# Patient Record
Sex: Female | Born: 1935 | Race: White | Hispanic: No | Marital: Married | Smoking: Former smoker
Health system: Southern US, Community
[De-identification: ages and names within clinical notes are randomized; demographics above are authoritative.]

## PROBLEM LIST (undated history)

## (undated) DIAGNOSIS — I340 Nonrheumatic mitral (valve) insufficiency: Secondary | ICD-10-CM

## (undated) DIAGNOSIS — K5730 Diverticulosis of large intestine without perforation or abscess without bleeding: Secondary | ICD-10-CM

## (undated) DIAGNOSIS — J4489 Other specified chronic obstructive pulmonary disease: Secondary | ICD-10-CM

## (undated) DIAGNOSIS — M810 Age-related osteoporosis without current pathological fracture: Secondary | ICD-10-CM

## (undated) DIAGNOSIS — E785 Hyperlipidemia, unspecified: Secondary | ICD-10-CM

## (undated) DIAGNOSIS — L900 Lichen sclerosus et atrophicus: Secondary | ICD-10-CM

## (undated) DIAGNOSIS — K559 Vascular disorder of intestine, unspecified: Secondary | ICD-10-CM

## (undated) DIAGNOSIS — K2990 Gastroduodenitis, unspecified, without bleeding: Secondary | ICD-10-CM

## (undated) DIAGNOSIS — M069 Rheumatoid arthritis, unspecified: Secondary | ICD-10-CM

## (undated) DIAGNOSIS — K2970 Gastritis, unspecified, without bleeding: Secondary | ICD-10-CM

## (undated) DIAGNOSIS — H7091 Unspecified mastoiditis, right ear: Secondary | ICD-10-CM

## (undated) DIAGNOSIS — I10 Essential (primary) hypertension: Secondary | ICD-10-CM

## (undated) DIAGNOSIS — N6019 Diffuse cystic mastopathy of unspecified breast: Secondary | ICD-10-CM

## (undated) DIAGNOSIS — J449 Chronic obstructive pulmonary disease, unspecified: Secondary | ICD-10-CM

## (undated) DIAGNOSIS — I071 Rheumatic tricuspid insufficiency: Secondary | ICD-10-CM

## (undated) DIAGNOSIS — S82409A Unspecified fracture of shaft of unspecified fibula, initial encounter for closed fracture: Secondary | ICD-10-CM

## (undated) DIAGNOSIS — C50912 Malignant neoplasm of unspecified site of left female breast: Secondary | ICD-10-CM

## (undated) DIAGNOSIS — M5030 Other cervical disc degeneration, unspecified cervical region: Secondary | ICD-10-CM

## (undated) DIAGNOSIS — F439 Reaction to severe stress, unspecified: Secondary | ICD-10-CM

## (undated) DIAGNOSIS — I7300 Raynaud's syndrome without gangrene: Secondary | ICD-10-CM

## (undated) DIAGNOSIS — I350 Nonrheumatic aortic (valve) stenosis: Secondary | ICD-10-CM

## (undated) DIAGNOSIS — E039 Hypothyroidism, unspecified: Secondary | ICD-10-CM

## (undated) DIAGNOSIS — M4800 Spinal stenosis, site unspecified: Secondary | ICD-10-CM

## (undated) DIAGNOSIS — K644 Residual hemorrhoidal skin tags: Secondary | ICD-10-CM

## (undated) DIAGNOSIS — K219 Gastro-esophageal reflux disease without esophagitis: Secondary | ICD-10-CM

## (undated) DIAGNOSIS — F419 Anxiety disorder, unspecified: Secondary | ICD-10-CM

## (undated) DIAGNOSIS — Z87891 Personal history of nicotine dependence: Secondary | ICD-10-CM

## (undated) HISTORY — DX: Diverticulosis of large intestine without perforation or abscess without bleeding: K57.30

## (undated) HISTORY — DX: Gastritis, unspecified, without bleeding: K29.70

## (undated) HISTORY — PX: TONSILLECTOMY: SUR1361

## (undated) HISTORY — DX: Raynaud's syndrome without gangrene: I73.00

## (undated) HISTORY — PX: BREAST LUMPECTOMY: SHX2

## (undated) HISTORY — DX: Vascular disorder of intestine, unspecified: K55.9

## (undated) HISTORY — PX: OTHER SURGICAL HISTORY: SHX169

## (undated) HISTORY — DX: Unspecified fracture of shaft of unspecified fibula, initial encounter for closed fracture: S82.409A

## (undated) HISTORY — DX: Spinal stenosis, site unspecified: M48.00

## (undated) HISTORY — DX: Gastro-esophageal reflux disease without esophagitis: K21.9

## (undated) HISTORY — DX: Gastroduodenitis, unspecified, without bleeding: K29.90

## (undated) HISTORY — DX: Personal history of nicotine dependence: Z87.891

## (undated) HISTORY — DX: Other specified chronic obstructive pulmonary disease: J44.89

## (undated) HISTORY — DX: Chronic obstructive pulmonary disease, unspecified: J44.9

## (undated) HISTORY — PX: BREAST BIOPSY: SHX20

## (undated) HISTORY — DX: Unspecified mastoiditis, right ear: H70.91

## (undated) HISTORY — DX: Age-related osteoporosis without current pathological fracture: M81.0

## (undated) HISTORY — PX: CATARACT EXTRACTION W/ INTRAOCULAR LENS IMPLANT: SHX1309

## (undated) HISTORY — DX: Residual hemorrhoidal skin tags: K64.4

## (undated) HISTORY — DX: Other cervical disc degeneration, unspecified cervical region: M50.30

## (undated) HISTORY — DX: Hyperlipidemia, unspecified: E78.5

## (undated) HISTORY — DX: Lichen sclerosus et atrophicus: L90.0

## (undated) HISTORY — PX: COLONOSCOPY: SHX174

## (undated) HISTORY — DX: Diffuse cystic mastopathy of unspecified breast: N60.19

## (undated) HISTORY — DX: Hypothyroidism, unspecified: E03.9

---

## 1966-02-07 HISTORY — PX: ABDOMINAL HYSTERECTOMY: SHX81

## 1997-07-16 ENCOUNTER — Other Ambulatory Visit: Admission: RE | Admit: 1997-07-16 | Discharge: 1997-07-16 | Payer: Self-pay | Admitting: *Deleted

## 1997-09-04 ENCOUNTER — Other Ambulatory Visit: Admission: RE | Admit: 1997-09-04 | Discharge: 1997-09-04 | Payer: Self-pay | Admitting: *Deleted

## 1998-03-31 ENCOUNTER — Encounter: Admission: RE | Admit: 1998-03-31 | Discharge: 1998-06-29 | Payer: Self-pay | Admitting: Otolaryngology

## 1999-02-08 HISTORY — PX: OTHER SURGICAL HISTORY: SHX169

## 1999-02-16 ENCOUNTER — Ambulatory Visit (HOSPITAL_COMMUNITY): Admission: RE | Admit: 1999-02-16 | Discharge: 1999-02-16 | Payer: Self-pay | Admitting: Family Medicine

## 1999-10-14 ENCOUNTER — Encounter: Admission: RE | Admit: 1999-10-14 | Discharge: 1999-10-14 | Payer: Self-pay | Admitting: *Deleted

## 1999-10-14 ENCOUNTER — Encounter: Payer: Self-pay | Admitting: *Deleted

## 1999-10-15 ENCOUNTER — Ambulatory Visit (HOSPITAL_COMMUNITY): Admission: RE | Admit: 1999-10-15 | Discharge: 1999-10-15 | Payer: Self-pay | Admitting: *Deleted

## 1999-11-05 ENCOUNTER — Encounter: Payer: Self-pay | Admitting: *Deleted

## 1999-11-05 ENCOUNTER — Encounter: Admission: RE | Admit: 1999-11-05 | Discharge: 1999-11-05 | Payer: Self-pay | Admitting: *Deleted

## 2000-01-04 ENCOUNTER — Encounter: Admission: RE | Admit: 2000-01-04 | Discharge: 2000-01-04 | Payer: Self-pay | Admitting: Orthopedic Surgery

## 2000-01-04 ENCOUNTER — Encounter: Payer: Self-pay | Admitting: Orthopedic Surgery

## 2000-01-10 ENCOUNTER — Encounter: Payer: Self-pay | Admitting: Orthopedic Surgery

## 2000-01-10 ENCOUNTER — Encounter: Admission: RE | Admit: 2000-01-10 | Discharge: 2000-01-10 | Payer: Self-pay | Admitting: Orthopedic Surgery

## 2000-06-06 ENCOUNTER — Encounter (INDEPENDENT_AMBULATORY_CARE_PROVIDER_SITE_OTHER): Payer: Self-pay | Admitting: Specialist

## 2000-06-06 ENCOUNTER — Inpatient Hospital Stay (HOSPITAL_COMMUNITY): Admission: EM | Admit: 2000-06-06 | Discharge: 2000-06-09 | Payer: Self-pay | Admitting: *Deleted

## 2000-07-08 DIAGNOSIS — K559 Vascular disorder of intestine, unspecified: Secondary | ICD-10-CM

## 2000-07-08 HISTORY — DX: Vascular disorder of intestine, unspecified: K55.9

## 2000-10-16 ENCOUNTER — Other Ambulatory Visit: Admission: RE | Admit: 2000-10-16 | Discharge: 2000-10-16 | Payer: Self-pay | Admitting: *Deleted

## 2001-01-02 ENCOUNTER — Ambulatory Visit (HOSPITAL_COMMUNITY): Admission: RE | Admit: 2001-01-02 | Discharge: 2001-01-02 | Payer: Self-pay | Admitting: Pulmonary Disease

## 2001-01-02 ENCOUNTER — Encounter: Payer: Self-pay | Admitting: Pulmonary Disease

## 2002-02-07 HISTORY — PX: OTHER SURGICAL HISTORY: SHX169

## 2002-08-01 LAB — HM COLONOSCOPY

## 2002-09-23 ENCOUNTER — Encounter: Payer: Self-pay | Admitting: Pulmonary Disease

## 2002-09-23 ENCOUNTER — Encounter: Admission: RE | Admit: 2002-09-23 | Discharge: 2002-09-23 | Payer: Self-pay | Admitting: Pulmonary Disease

## 2002-10-09 HISTORY — PX: NASAL SINUS SURGERY: SHX719

## 2002-11-04 ENCOUNTER — Encounter: Payer: Self-pay | Admitting: Otolaryngology

## 2002-11-04 ENCOUNTER — Encounter: Admission: RE | Admit: 2002-11-04 | Discharge: 2002-11-04 | Payer: Self-pay | Admitting: Otolaryngology

## 2002-11-05 ENCOUNTER — Ambulatory Visit (HOSPITAL_COMMUNITY): Admission: RE | Admit: 2002-11-05 | Discharge: 2002-11-05 | Payer: Self-pay | Admitting: Otolaryngology

## 2002-11-05 ENCOUNTER — Ambulatory Visit (HOSPITAL_BASED_OUTPATIENT_CLINIC_OR_DEPARTMENT_OTHER): Admission: RE | Admit: 2002-11-05 | Discharge: 2002-11-05 | Payer: Self-pay | Admitting: Otolaryngology

## 2002-11-05 ENCOUNTER — Encounter (INDEPENDENT_AMBULATORY_CARE_PROVIDER_SITE_OTHER): Payer: Self-pay | Admitting: *Deleted

## 2002-11-08 ENCOUNTER — Encounter: Payer: Self-pay | Admitting: Family Medicine

## 2002-11-08 LAB — CONVERTED CEMR LAB

## 2003-12-22 ENCOUNTER — Ambulatory Visit: Payer: Self-pay | Admitting: Family Medicine

## 2004-02-08 DIAGNOSIS — S82409A Unspecified fracture of shaft of unspecified fibula, initial encounter for closed fracture: Secondary | ICD-10-CM

## 2004-02-08 HISTORY — DX: Unspecified fracture of shaft of unspecified fibula, initial encounter for closed fracture: S82.409A

## 2004-02-13 ENCOUNTER — Encounter: Admission: RE | Admit: 2004-02-13 | Discharge: 2004-02-13 | Payer: Self-pay | Admitting: *Deleted

## 2004-08-17 ENCOUNTER — Ambulatory Visit: Payer: Self-pay | Admitting: Family Medicine

## 2004-08-31 ENCOUNTER — Ambulatory Visit: Payer: Self-pay | Admitting: Family Medicine

## 2004-09-01 ENCOUNTER — Ambulatory Visit: Payer: Self-pay | Admitting: Internal Medicine

## 2004-09-10 ENCOUNTER — Ambulatory Visit: Payer: Self-pay | Admitting: Cardiology

## 2004-09-14 ENCOUNTER — Ambulatory Visit: Payer: Self-pay | Admitting: Family Medicine

## 2004-09-28 ENCOUNTER — Ambulatory Visit: Payer: Self-pay | Admitting: Family Medicine

## 2004-10-12 ENCOUNTER — Ambulatory Visit: Payer: Self-pay | Admitting: Family Medicine

## 2004-11-07 HISTORY — PX: US ECHOCARDIOGRAPHY: HXRAD669

## 2004-11-16 ENCOUNTER — Ambulatory Visit: Payer: Self-pay | Admitting: Cardiology

## 2004-11-24 ENCOUNTER — Ambulatory Visit: Payer: Self-pay | Admitting: Family Medicine

## 2004-11-29 ENCOUNTER — Ambulatory Visit: Payer: Self-pay

## 2004-12-01 ENCOUNTER — Ambulatory Visit: Payer: Self-pay | Admitting: Family Medicine

## 2005-01-14 ENCOUNTER — Encounter: Admission: RE | Admit: 2005-01-14 | Discharge: 2005-01-14 | Payer: Self-pay | Admitting: Orthopedic Surgery

## 2005-02-03 ENCOUNTER — Encounter: Admission: RE | Admit: 2005-02-03 | Discharge: 2005-02-03 | Payer: Self-pay | Admitting: Orthopedic Surgery

## 2005-02-16 ENCOUNTER — Encounter: Admission: RE | Admit: 2005-02-16 | Discharge: 2005-02-16 | Payer: Self-pay | Admitting: Orthopedic Surgery

## 2005-03-03 ENCOUNTER — Ambulatory Visit: Payer: Self-pay | Admitting: Family Medicine

## 2005-04-07 DIAGNOSIS — H7091 Unspecified mastoiditis, right ear: Secondary | ICD-10-CM

## 2005-04-07 HISTORY — DX: Unspecified mastoiditis, right ear: H70.91

## 2005-04-14 ENCOUNTER — Ambulatory Visit: Payer: Self-pay | Admitting: Family Medicine

## 2005-05-24 ENCOUNTER — Encounter: Admission: RE | Admit: 2005-05-24 | Discharge: 2005-05-24 | Payer: Self-pay | Admitting: Obstetrics & Gynecology

## 2005-06-06 ENCOUNTER — Encounter: Admission: RE | Admit: 2005-06-06 | Discharge: 2005-06-06 | Payer: Self-pay | Admitting: Orthopedic Surgery

## 2005-06-06 ENCOUNTER — Ambulatory Visit: Payer: Self-pay | Admitting: Cardiology

## 2005-06-07 HISTORY — PX: SHOULDER ARTHROSCOPY W/ ROTATOR CUFF REPAIR: SHX2400

## 2005-06-21 ENCOUNTER — Encounter: Admission: RE | Admit: 2005-06-21 | Discharge: 2005-06-21 | Payer: Self-pay | Admitting: Obstetrics & Gynecology

## 2005-06-23 ENCOUNTER — Encounter: Admission: RE | Admit: 2005-06-23 | Discharge: 2005-06-23 | Payer: Self-pay | Admitting: Orthopedic Surgery

## 2005-06-28 ENCOUNTER — Ambulatory Visit (HOSPITAL_BASED_OUTPATIENT_CLINIC_OR_DEPARTMENT_OTHER): Admission: RE | Admit: 2005-06-28 | Discharge: 2005-06-29 | Payer: Self-pay | Admitting: Orthopedic Surgery

## 2005-07-21 ENCOUNTER — Ambulatory Visit: Payer: Self-pay | Admitting: Cardiology

## 2005-08-05 ENCOUNTER — Ambulatory Visit: Payer: Self-pay | Admitting: Family Medicine

## 2005-11-22 ENCOUNTER — Ambulatory Visit: Payer: Self-pay | Admitting: Cardiology

## 2005-12-08 HISTORY — PX: OTHER SURGICAL HISTORY: SHX169

## 2005-12-14 ENCOUNTER — Ambulatory Visit: Payer: Self-pay

## 2005-12-22 ENCOUNTER — Encounter: Admission: RE | Admit: 2005-12-22 | Discharge: 2005-12-22 | Payer: Self-pay | Admitting: Surgery

## 2006-01-27 ENCOUNTER — Ambulatory Visit: Payer: Self-pay | Admitting: Family Medicine

## 2006-02-10 ENCOUNTER — Ambulatory Visit: Payer: Self-pay | Admitting: Family Medicine

## 2006-02-14 ENCOUNTER — Encounter: Admission: RE | Admit: 2006-02-14 | Discharge: 2006-03-06 | Payer: Self-pay | Admitting: Orthopedic Surgery

## 2006-02-24 ENCOUNTER — Ambulatory Visit: Payer: Self-pay | Admitting: Family Medicine

## 2006-03-10 ENCOUNTER — Ambulatory Visit: Payer: Self-pay | Admitting: Family Medicine

## 2006-03-10 LAB — CONVERTED CEMR LAB
ALT: 20 units/L (ref 0–40)
AST: 24 units/L (ref 0–37)
Albumin: 4.1 g/dL (ref 3.5–5.2)
BUN: 17 mg/dL (ref 6–23)
CO2: 29 meq/L (ref 19–32)
Calcium: 9.1 mg/dL (ref 8.4–10.5)
Chloride: 106 meq/L (ref 96–112)
Cholesterol: 307 mg/dL (ref 0–200)
Creatinine, Ser: 0.9 mg/dL (ref 0.4–1.2)
Direct LDL: 232.5 mg/dL
GFR calc Af Amer: 78 mL/min
GFR calc non Af Amer: 64 mL/min
Glucose, Bld: 100 mg/dL — ABNORMAL HIGH (ref 70–99)
HDL: 56.8 mg/dL (ref >39.0)
Phosphorus: 4 mg/dL (ref 2.3–4.6)
Potassium: 4.2 meq/L (ref 3.5–5.1)
Sodium: 140 meq/L (ref 135–145)
TSH: 4.81 microintl units/mL
TSH: 4.81 microintl units/mL (ref 0.35–5.50)
Total CHOL/HDL Ratio: 5.4
Triglycerides: 86 mg/dL (ref 0–149)
VLDL: 17 mg/dL (ref 0–40)

## 2006-05-26 ENCOUNTER — Encounter: Admission: RE | Admit: 2006-05-26 | Discharge: 2006-05-26 | Payer: Self-pay | Admitting: Obstetrics & Gynecology

## 2006-05-26 LAB — HM MAMMOGRAPHY: HM Mammogram: NORMAL

## 2006-11-14 ENCOUNTER — Ambulatory Visit: Payer: Self-pay | Admitting: Emergency Medicine

## 2006-11-23 ENCOUNTER — Ambulatory Visit: Payer: Self-pay

## 2006-12-16 DIAGNOSIS — Z87891 Personal history of nicotine dependence: Secondary | ICD-10-CM | POA: Insufficient documentation

## 2006-12-16 DIAGNOSIS — K219 Gastro-esophageal reflux disease without esophagitis: Secondary | ICD-10-CM | POA: Insufficient documentation

## 2006-12-16 DIAGNOSIS — I10 Essential (primary) hypertension: Secondary | ICD-10-CM | POA: Insufficient documentation

## 2006-12-16 DIAGNOSIS — R0989 Other specified symptoms and signs involving the circulatory and respiratory systems: Secondary | ICD-10-CM | POA: Insufficient documentation

## 2006-12-16 DIAGNOSIS — Z9079 Acquired absence of other genital organ(s): Secondary | ICD-10-CM | POA: Insufficient documentation

## 2006-12-16 DIAGNOSIS — R0609 Other forms of dyspnea: Secondary | ICD-10-CM | POA: Insufficient documentation

## 2006-12-16 DIAGNOSIS — R059 Cough, unspecified: Secondary | ICD-10-CM | POA: Insufficient documentation

## 2006-12-16 DIAGNOSIS — R05 Cough: Secondary | ICD-10-CM

## 2006-12-18 ENCOUNTER — Ambulatory Visit: Payer: Self-pay | Admitting: Emergency Medicine

## 2006-12-19 ENCOUNTER — Ambulatory Visit: Payer: Self-pay | Admitting: Cardiovascular Disease

## 2007-01-10 ENCOUNTER — Encounter: Payer: Self-pay | Admitting: Family Medicine

## 2007-01-17 ENCOUNTER — Encounter (INDEPENDENT_AMBULATORY_CARE_PROVIDER_SITE_OTHER): Payer: Self-pay | Admitting: *Deleted

## 2007-01-18 ENCOUNTER — Encounter: Payer: Self-pay | Admitting: Family Medicine

## 2007-01-22 ENCOUNTER — Ambulatory Visit: Payer: Self-pay | Admitting: Family Medicine

## 2007-01-22 DIAGNOSIS — K5730 Diverticulosis of large intestine without perforation or abscess without bleeding: Secondary | ICD-10-CM | POA: Insufficient documentation

## 2007-01-22 DIAGNOSIS — N6019 Diffuse cystic mastopathy of unspecified breast: Secondary | ICD-10-CM | POA: Insufficient documentation

## 2007-01-22 DIAGNOSIS — E039 Hypothyroidism, unspecified: Secondary | ICD-10-CM | POA: Insufficient documentation

## 2007-01-22 DIAGNOSIS — J4489 Other specified chronic obstructive pulmonary disease: Secondary | ICD-10-CM | POA: Insufficient documentation

## 2007-01-22 DIAGNOSIS — J449 Chronic obstructive pulmonary disease, unspecified: Secondary | ICD-10-CM | POA: Insufficient documentation

## 2007-01-22 DIAGNOSIS — M810 Age-related osteoporosis without current pathological fracture: Secondary | ICD-10-CM | POA: Insufficient documentation

## 2007-01-22 DIAGNOSIS — I7300 Raynaud's syndrome without gangrene: Secondary | ICD-10-CM | POA: Insufficient documentation

## 2007-01-22 DIAGNOSIS — K644 Residual hemorrhoidal skin tags: Secondary | ICD-10-CM | POA: Insufficient documentation

## 2007-03-07 ENCOUNTER — Ambulatory Visit: Payer: Self-pay | Admitting: Family Medicine

## 2007-03-14 LAB — CONVERTED CEMR LAB
ALT: 23 units/L (ref 0–35)
AST: 24 units/L (ref 0–37)
Albumin: 4.1 g/dL (ref 3.5–5.2)
BUN: 17 mg/dL (ref 6–23)
Basophils Absolute: 0 10*3/uL (ref 0.0–0.1)
Basophils Relative: 0.1 % (ref 0.0–1.0)
CO2: 29 meq/L (ref 19–32)
Calcium: 9.5 mg/dL (ref 8.4–10.5)
Chloride: 106 meq/L (ref 96–112)
Cholesterol: 251 mg/dL (ref 0–200)
Creatinine, Ser: 1.1 mg/dL (ref 0.4–1.2)
Direct LDL: 175.4 mg/dL
Eosinophils Absolute: 0.2 10*3/uL (ref 0.0–0.6)
Eosinophils Relative: 3.9 % (ref 0.0–5.0)
GFR calc Af Amer: 62 mL/min
GFR calc non Af Amer: 51 mL/min
Glucose, Bld: 112 mg/dL — ABNORMAL HIGH (ref 70–99)
HCT: 38.3 % (ref 36.0–46.0)
HDL: 54.5 mg/dL (ref >39.0)
Hemoglobin: 12.7 g/dL (ref 12.0–15.0)
Lymphocytes Relative: 31.1 % (ref 12.0–46.0)
MCHC: 33.2 g/dL (ref 30.0–36.0)
MCV: 92.4 fL (ref 78.0–100.0)
Monocytes Absolute: 0.4 10*3/uL (ref 0.2–0.7)
Monocytes Relative: 7.4 % (ref 3.0–11.0)
Neutro Abs: 3.2 10*3/uL (ref 1.4–7.7)
Neutrophils Relative %: 57.5 % (ref 43.0–77.0)
Phosphorus: 3 mg/dL (ref 2.3–4.6)
Platelets: 224 10*3/uL (ref 150–400)
Potassium: 4.1 meq/L (ref 3.5–5.1)
RBC: 4.15 M/uL (ref 3.87–5.11)
RDW: 11.9 % (ref 11.5–14.6)
Sodium: 141 meq/L (ref 135–145)
TSH: 0.43 microintl units/mL (ref 0.35–5.50)
Total CHOL/HDL Ratio: 4.6
Triglycerides: 114 mg/dL (ref 0–149)
VLDL: 23 mg/dL (ref 0–40)
WBC: 5.5 10*3/uL (ref 4.5–10.5)

## 2007-03-19 ENCOUNTER — Encounter: Payer: Self-pay | Admitting: Family Medicine

## 2007-03-29 ENCOUNTER — Ambulatory Visit: Payer: Self-pay | Admitting: Family Medicine

## 2007-03-29 LAB — CONVERTED CEMR LAB: Inflenza A Ag: NEGATIVE

## 2007-05-30 ENCOUNTER — Encounter: Admission: RE | Admit: 2007-05-30 | Discharge: 2007-05-30 | Payer: Self-pay | Admitting: Obstetrics & Gynecology

## 2007-06-14 ENCOUNTER — Encounter: Payer: Self-pay | Admitting: Family Medicine

## 2007-06-27 ENCOUNTER — Encounter: Payer: Self-pay | Admitting: Family Medicine

## 2007-09-11 ENCOUNTER — Encounter: Payer: Self-pay | Admitting: Family Medicine

## 2007-10-11 ENCOUNTER — Telehealth (INDEPENDENT_AMBULATORY_CARE_PROVIDER_SITE_OTHER): Payer: Self-pay | Admitting: *Deleted

## 2007-10-12 ENCOUNTER — Ambulatory Visit: Payer: Self-pay | Admitting: Family Medicine

## 2007-10-12 DIAGNOSIS — R4589 Other symptoms and signs involving emotional state: Secondary | ICD-10-CM | POA: Insufficient documentation

## 2007-10-12 DIAGNOSIS — M5030 Other cervical disc degeneration, unspecified cervical region: Secondary | ICD-10-CM | POA: Insufficient documentation

## 2007-10-17 LAB — CONVERTED CEMR LAB
ALT: 23 units/L (ref 0–35)
AST: 21 units/L (ref 0–37)
Albumin: 4.4 g/dL (ref 3.5–5.2)
BUN: 32 mg/dL — ABNORMAL HIGH (ref 6–23)
CO2: 31 meq/L (ref 19–32)
Calcium: 9.4 mg/dL (ref 8.4–10.5)
Chloride: 107 meq/L (ref 96–112)
Cholesterol: 213 mg/dL (ref 0–200)
Creatinine, Ser: 1.1 mg/dL (ref 0.4–1.2)
Direct LDL: 128.6 mg/dL
GFR calc Af Amer: 61 mL/min
GFR calc non Af Amer: 51 mL/min
Glucose, Bld: 101 mg/dL — ABNORMAL HIGH (ref 70–99)
HDL: 44.2 mg/dL (ref >39.0)
Phosphorus: 3.6 mg/dL (ref 2.3–4.6)
Potassium: 4.6 meq/L (ref 3.5–5.1)
Sodium: 142 meq/L (ref 135–145)
TSH: 19.79 microintl units/mL — ABNORMAL HIGH (ref 0.35–5.50)
Total CHOL/HDL Ratio: 4.8
Triglycerides: 130 mg/dL (ref 0–149)
VLDL: 26 mg/dL (ref 0–40)

## 2007-10-26 ENCOUNTER — Encounter (INDEPENDENT_AMBULATORY_CARE_PROVIDER_SITE_OTHER): Payer: Self-pay | Admitting: *Deleted

## 2007-11-08 ENCOUNTER — Encounter: Payer: Self-pay | Admitting: Family Medicine

## 2007-12-27 ENCOUNTER — Ambulatory Visit: Payer: Self-pay | Admitting: Family Medicine

## 2008-01-01 ENCOUNTER — Ambulatory Visit: Payer: Self-pay | Admitting: Family Medicine

## 2008-03-18 ENCOUNTER — Encounter: Payer: Self-pay | Admitting: Family Medicine

## 2008-03-25 ENCOUNTER — Telehealth (INDEPENDENT_AMBULATORY_CARE_PROVIDER_SITE_OTHER): Payer: Self-pay | Admitting: Internal Medicine

## 2008-03-28 ENCOUNTER — Ambulatory Visit: Payer: Self-pay | Admitting: Family Medicine

## 2008-03-28 ENCOUNTER — Encounter (INDEPENDENT_AMBULATORY_CARE_PROVIDER_SITE_OTHER): Payer: Self-pay | Admitting: Internal Medicine

## 2008-03-28 ENCOUNTER — Telehealth (INDEPENDENT_AMBULATORY_CARE_PROVIDER_SITE_OTHER): Payer: Self-pay | Admitting: *Deleted

## 2008-04-01 LAB — CONVERTED CEMR LAB
BUN: 20 mg/dL (ref 6–23)
Basophils Absolute: 0 10*3/uL (ref 0.0–0.1)
Basophils Relative: 0 % (ref 0–1)
CO2: 25 meq/L (ref 19–32)
Calcium: 9 mg/dL (ref 8.4–10.5)
Chloride: 106 meq/L (ref 96–112)
Creatinine, Ser: 0.94 mg/dL (ref 0.40–1.20)
Eosinophils Absolute: 0.2 10*3/uL (ref 0.0–0.7)
Eosinophils Relative: 5 % (ref 0–5)
Glucose, Bld: 91 mg/dL (ref 70–99)
HCT: 36.3 % (ref 36.0–46.0)
Hemoglobin: 12.1 g/dL (ref 12.0–15.0)
Lymphocytes Relative: 37 % (ref 12–46)
Lymphs Abs: 1.8 10*3/uL (ref 0.7–4.0)
MCHC: 33.3 g/dL (ref 30.0–36.0)
MCV: 89.6 fL (ref 78.0–100.0)
Monocytes Absolute: 0.5 10*3/uL (ref 0.1–1.0)
Monocytes Relative: 11 % (ref 3–12)
Neutro Abs: 2.3 10*3/uL (ref 1.7–7.7)
Neutrophils Relative %: 47 % (ref 43–77)
Platelets: 204 10*3/uL (ref 150–400)
Potassium: 4.2 meq/L (ref 3.5–5.3)
RBC: 4.05 M/uL (ref 3.87–5.11)
RDW: 12.3 % (ref 11.5–15.5)
Sodium: 143 meq/L (ref 135–145)
TSH: 0.19 microintl units/mL — ABNORMAL LOW (ref 0.35–5.50)
WBC: 4.8 10*3/uL (ref 4.0–10.5)

## 2008-05-22 ENCOUNTER — Ambulatory Visit: Payer: Self-pay | Admitting: Family Medicine

## 2008-05-23 LAB — CONVERTED CEMR LAB: TSH: 1.34 microintl units/mL (ref 0.35–5.50)

## 2008-06-18 ENCOUNTER — Encounter: Admission: RE | Admit: 2008-06-18 | Discharge: 2008-06-18 | Payer: Self-pay | Admitting: Obstetrics & Gynecology

## 2008-07-08 ENCOUNTER — Ambulatory Visit: Payer: Self-pay | Admitting: Ophthalmology

## 2008-07-21 ENCOUNTER — Ambulatory Visit: Payer: Self-pay | Admitting: Ophthalmology

## 2008-07-23 ENCOUNTER — Encounter: Payer: Self-pay | Admitting: Family Medicine

## 2008-08-04 ENCOUNTER — Encounter: Payer: Self-pay | Admitting: Family Medicine

## 2008-08-13 ENCOUNTER — Telehealth (INDEPENDENT_AMBULATORY_CARE_PROVIDER_SITE_OTHER): Payer: Self-pay | Admitting: Internal Medicine

## 2008-12-08 ENCOUNTER — Encounter: Payer: Self-pay | Admitting: Family Medicine

## 2008-12-24 ENCOUNTER — Encounter: Payer: Self-pay | Admitting: Family Medicine

## 2009-04-01 ENCOUNTER — Ambulatory Visit: Payer: Self-pay | Admitting: Family Medicine

## 2009-04-01 DIAGNOSIS — K2970 Gastritis, unspecified, without bleeding: Secondary | ICD-10-CM | POA: Insufficient documentation

## 2009-04-03 LAB — CONVERTED CEMR LAB
ALT: 26 units/L (ref 0–35)
AST: 22 units/L (ref 0–37)
Albumin: 3.9 g/dL (ref 3.5–5.2)
Alkaline Phosphatase: 61 units/L (ref 39–117)
BUN: 26 mg/dL — ABNORMAL HIGH (ref 6–23)
Basophils Absolute: 0 10*3/uL (ref 0.0–0.1)
Basophils Relative: 0.3 % (ref 0.0–3.0)
Bilirubin, Direct: 0.1 mg/dL (ref 0.0–0.3)
CO2: 30 meq/L (ref 19–32)
Calcium: 9 mg/dL (ref 8.4–10.5)
Chloride: 107 meq/L (ref 96–112)
Creatinine, Ser: 1.1 mg/dL (ref 0.4–1.2)
Eosinophils Absolute: 0.2 10*3/uL (ref 0.0–0.7)
Eosinophils Relative: 4.5 % (ref 0.0–5.0)
GFR calc non Af Amer: 50.55 mL/min (ref >60)
Glucose, Bld: 97 mg/dL (ref 70–99)
HCT: 36 % (ref 36.0–46.0)
Hemoglobin: 12.3 g/dL (ref 12.0–15.0)
Lymphocytes Relative: 28.2 % (ref 12.0–46.0)
Lymphs Abs: 1.4 10*3/uL (ref 0.7–4.0)
MCHC: 34.1 g/dL (ref 30.0–36.0)
MCV: 94.6 fL (ref 78.0–100.0)
Monocytes Absolute: 0.5 10*3/uL (ref 0.1–1.0)
Monocytes Relative: 9.3 % (ref 3.0–12.0)
Neutro Abs: 2.9 10*3/uL (ref 1.4–7.7)
Neutrophils Relative %: 57.7 % (ref 43.0–77.0)
Platelets: 166 10*3/uL (ref 150.0–400.0)
Potassium: 4.7 meq/L (ref 3.5–5.1)
RBC: 3.81 M/uL — ABNORMAL LOW (ref 3.87–5.11)
RDW: 11.1 % — ABNORMAL LOW (ref 11.5–14.6)
Sodium: 141 meq/L (ref 135–145)
Total Bilirubin: 0.7 mg/dL (ref 0.3–1.2)
Total Protein: 7 g/dL (ref 6.0–8.3)
WBC: 5 10*3/uL (ref 4.5–10.5)

## 2009-04-08 ENCOUNTER — Ambulatory Visit: Payer: Self-pay | Admitting: Family Medicine

## 2009-04-28 ENCOUNTER — Encounter: Payer: Self-pay | Admitting: Family Medicine

## 2009-07-02 ENCOUNTER — Ambulatory Visit: Payer: Self-pay | Admitting: Family Medicine

## 2009-07-02 DIAGNOSIS — J018 Other acute sinusitis: Secondary | ICD-10-CM | POA: Insufficient documentation

## 2009-08-27 ENCOUNTER — Encounter: Payer: Self-pay | Admitting: Family Medicine

## 2010-01-21 ENCOUNTER — Encounter
Admission: RE | Admit: 2010-01-21 | Discharge: 2010-01-21 | Payer: Self-pay | Source: Home / Self Care | Attending: Obstetrics & Gynecology | Admitting: Obstetrics & Gynecology

## 2010-01-29 ENCOUNTER — Encounter: Payer: Self-pay | Admitting: Family Medicine

## 2010-02-07 DIAGNOSIS — I340 Nonrheumatic mitral (valve) insufficiency: Secondary | ICD-10-CM

## 2010-02-07 DIAGNOSIS — I071 Rheumatic tricuspid insufficiency: Secondary | ICD-10-CM

## 2010-02-07 HISTORY — DX: Rheumatic tricuspid insufficiency: I07.1

## 2010-02-07 HISTORY — DX: Nonrheumatic mitral (valve) insufficiency: I34.0

## 2010-03-11 NOTE — Assessment & Plan Note (Signed)
Summary: ow bp, weight loss/tscl   Vital Signs:  Patient Profile:   77 Years Old Female Weight:      161 pounds Temp:     97.9 degrees F oral Pulse rate:   72 / minute Pulse rhythm:   regular BP sitting:   120 / 70  (left arm) Cuff size:   regular  Vitals Entered By: Liane Comber (October 12, 2007 9:19 AM)                 Chief Complaint:  low bp and weight loss/ labs?/ synthroid refill.  History of Present Illness: bp started getting really low-- 90s over 50s-- and holding her bp med  great deal of pain in neck-- ? disc disease -- pending f/u with neurosx for further plan (at [facility] ) nerve involvement with tingling in shoulder  this is causing pressure in her head- on that side also  makes her legs tired when she walks   night sweats -- constantly perspiring  loosing wt -- likely from stress/pain and less by mouth intake  ? 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 AVELOX ! LIPITOR ! * TRIAMINIC * CRESTOR ACE INHIBITORS ZOCOR * ZETIA * VYTORIN  Past Medical History:    Reviewed history from 02/08/2007 and no changes required:       Hyperlipidemia       Hypertension       Rheumatoid arthritis       GERD       COPD       Diverticulosis, colon       Hypothyroidism       Osteoporosis       sero-negative rheumatoid arthritis       lichen sclerosis- back/abd/vulvar  Past Surgical History:  Reviewed history from 01/22/2007 and no changes required:       Hysterectomy (1968)       Hospital- ischemic colitis, ? infect (06/202)       Spinal stenosis       Dexa- OP, heel (2001)       Cardiolite- neg (02/2002)       Ischemic colitis       Diverticulosis (07/2002)       Sinus surgery (05/2002)       Dexa- OP hip T-2.94       Fibula fracture (2006)       Echo- normal LVF EF 55-65% (11/2004)       Admit- right mastoiditis (04/2005)       Nuclear stress study- normal (12/2005)     Review of Systems  General      Complains of fatigue.      Denies chills, fever, loss of appetite, and malaise.  Eyes      Denies blurring and eye  pain.  CV      Complains of lightheadness.      Denies chest pain or discomfort, palpitations, shortness of breath with exertion, and swelling of feet.  Resp      Denies cough, shortness of breath, and wheezing.  GI      Denies abdominal pain, bloody stools, and change in bowel habits.  Derm      Denies dryness, itching, lesion(s), and rash.  Neuro      Denies numbness, tingling, tremors, and weakness.  Psych      Complains of anxiety.      Denies panic attacks, sense of great danger, and suicidal thoughts/plans.  Endo      Denies cold intolerance, excessive thirst, excessive urination, and heat intolerance.  Heme      Denies abnormal bruising.   Physical Exam  General:     well appearing but generally fatigued Head:     normocephalic, atraumatic, and no abnormalities observed.   Eyes:     vision grossly intact, pupils equal, pupils round, and pupils reactive to light.  no conjunctival pallor, injection or icterus  Nose:     no nasal discharge.   Mouth:     pharynx pink and moist.   Neck:     No deformities, masses, or tenderness noted. Lungs:     slt distant bs cta with no rales or wheeze  Heart:     Normal rate and regular rhythm. S1 and S2 normal without gallop, murmur, click, rub or other extra sounds. Abdomen:      Bowel sounds positive,abdomen soft and non-tender without masses, organomegaly or hernias noted. no renal bruits  Msk:     No deformity or scoliosis noted of thoracic or lumbar spine.  - no acute joint changes today Pulses:     R and L carotid,radial,femoral,dorsalis pedis and posterior tibial pulses are full and equal bilaterally Extremities:     No clubbing, cyanosis, edema, or deformity noted with normal full range of motion of all joints.   Neurologic:     cranial nerves II-XII intact, strength normal in all extremities, sensation intact to light touch, gait normal, and DTRs symmetrical and normal.  no tremor  Skin:     Intact without suspicious lesions or rashes Cervical Nodes:     No lymphadenopathy noted Inguinal Nodes:     No significant adenopathy Psych:     pleasant affect- talkative  not seemingly anxious    Impression & Recommendations:  Problem # 1:  HYPOTHYROIDISM (ICD-244.9) Assessment: Deteriorated off med for several weeks some wt loss and anx and general malaise (with chronic pain) check tsh-- update-- and make plan for med  Her updated medication list for this problem includes:    Synthroid 125 Mcg Tabs (Levothyroxine sodium) .Marland Kitchen... Take 1 tablet by mouth once a day  Orders: Venipuncture (16109) TLB-TSH (Thyroid Stimulating Hormone) (84443-TSH)  Labs Reviewed: TSH: 0.43 (03/07/2007)    Chol: 251 (03/07/2007)   HDL: 54.5 (03/07/2007)   LDL: DEL (03/07/2007)   TG: 114 (03/07/2007)   Problem # 2:  HYPERTENSION (ICD-401.9) Assessment: Improved decrease in bp with wt loss and stress will continue to hold both meds- check bp at home f/u in 1 mo  Her updated medication list for this problem includes:    Hydrochlorothiazide 12.5 Mg Tabs (Hydrochlorothiazide) .Marland Kitchen... Take 1 tablet by mouth once a day (holding for low bp)    Benicar Hct 40-25 Mg Tabs (Olmesartan medoxomil-hctz) .Marland Kitchen... Take 1 tablet by mouth  once a day (holding for low bp)  Orders: Venipuncture  (16109) TLB-Renal Function Panel (80069-RENAL) TLB-TSH (Thyroid Stimulating Hormone) (84443-TSH)  BP today: 120/70 Prior BP: 140/82 (03/29/2007)  Labs Reviewed: Creat: 1.1 (03/07/2007) Chol: 251 (03/07/2007)   HDL: 54.5 (03/07/2007)   LDL: DEL (03/07/2007)   TG: 114 (03/07/2007)   Problem # 3:  RHEUMATOID ARTHRITIS (ICD-714.0) Assessment: Unchanged continue rheum f/u- doing well overall The following medications were removed from the medication list:    Diclofenac Sodium 75 Mg Tbec (Diclofenac sodium) .Marland Kitchen... As needed   Problem # 4:  DISC DISEASE, CERVICAL (ICD-722.4) Assessment: Deteriorated with sig chronic pain f/u with neurosx as planned  Problem # 5:  STRESS REACTION, ACUTE, WITH EMOTIONAL DISTURBANCE (ICD-308.0) Assessment: Deteriorated many responsibilities now -- disc unrealistic expectations and symptoms urged her to talk to counselor from hospice good insight and communication skills   Complete Medication List: 1)  Synthroid 125 Mcg Tabs (Levothyroxine sodium) .... Take 1 tablet by mouth once a day 2)  Hydrochlorothiazide 12.5 Mg Tabs (Hydrochlorothiazide) .... Take 1 tablet by mouth once a day (holding for low bp) 3)  Benicar Hct 40-25 Mg Tabs (Olmesartan medoxomil-hctz) .... Take 1 tablet by mouth once a day (holding for low bp) 4)  Lipitor 40 Mg Tabs (Atorvastatin calcium) .Marland Kitchen.. 1 by mouth q evening with a low fat snack 5)  Vicodin 5-500 Mg Tabs (Hydrocodone-acetaminophen) .... One by mouth two times a day prn 6)  Cyclobenzaprine Hcl 10 Mg Tabs (Cyclobenzaprine hcl) .... One by mouth every 6 hours as needed  Other Orders: TLB-Lipid Panel (80061-LIPID) TLB-ALT (SGPT) (84460-ALT) TLB-AST (SGOT) (84450-SGOT)   Patient Instructions: 1)  investigate options for counseling through hospice  2)  try to to gradually cut your caffine  3)  will check labs today --and then update you re: what to do with thyroid dose  4)  follow up in about a month 5)  continue holding  blood pressure medicine-- benicar and hct  6)  keep eye on blood pressure at home    ]

## 2010-03-11 NOTE — Consult Note (Signed)
Summary: [facility] / DR[facility]  [facility] / DR. SHAILI DEVESHWAR   Imported By: Carin Primrose 09/13/2007 15:59:07  _____________________________________________________________________  External Attachment:    Type:   Image     Comment:   External Document

## 2010-03-11 NOTE — Assessment & Plan Note (Signed)
Summary: congestion/dlo   Vital Signs:  Patient Profile:   77 Years Old Female Weight:      163 pounds Temp:     98.2 degrees F oral Pulse rate:   74 / minute Pulse rhythm:   regular BP sitting:   128 / 80  (left arm) Cuff size:   large  Vitals Entered By: Lowella Petties (January 22, 2007 5:08 PM)                 Chief Complaint:  Cough and congestion.  History of Present Illness: thought this was a cold- stayed in bed and rested head congestion is better , but cough is productive and tight- and chest and back hurt afraid of getting bronchitis  did have a CT of sinuses from pulmonary - did show? some inflammation on L (but in past all syptoms have been on R which is puzzling)  has been taking some nasacort , and loratidine, and taking some mucinex D    Current Allergies: ! AVELOX ! LIPITOR ! * TRIAMINIC * CRESTOR ACE INHIBITORS ZOCOR * ZETIA * VYTORIN     Review of Systems      See HPI  General      Complains of fatigue and fever.      had fever on friday  Eyes      Complains of eye irritation.  ENT      Complains of earache, nasal congestion, and sinus pressure.      sinus pressure is on R side, R ear does hurt  Resp      Complains of cough.  GI      Denies diarrhea, nausea, and vomiting.  Derm      Denies rash.  Psych      lots of stress caring for sick family members   Physical Exam  General:     Well-developed,well-nourished,in no acute distress; alert,appropriate and cooperative throughout examination Head:     normocephalic, atraumatic, and no abnormalities observed.  mild L ethmoid sinus tenderness Eyes:     vision grossly intact, pupils equal, pupils round, pupils reactive to light, and no injection.   Ears:     R ear normal and L ear normal.   Nose:     nasal dischargemucosal pallor and mucosal edema.   Mouth:     pharynx pink and moist.   Neck:     supple with full rom and no masses or thyromegally, no JVD or carotid  bruit  Lungs:     nl resp effort with nl exp phase cta with scant rhonhi at bases, and wheeze on forced exp only no rales or crackles Heart:     Normal rate and regular rhythm. S1 and S2 normal without gallop, murmur, click, rub or other extra sounds. Skin:     Intact without suspicious lesions or rashes Cervical Nodes:     No lymphadenopathy noted Psych:     nl affect, somewhat tired appearing    Impression & Recommendations:  Problem # 1:  BRONCHITIS-ACUTE (ICD-466.0) will tx with augmentin and sympt care will watch closely for inc reactive airways has good understanding of mdi use- for prn Her updated medication list for this problem includes:    Augmentin 875-125 Mg Tabs (Amoxicillin-pot clavulanate) .Marland Kitchen... 1po two times a day for 10 days    Ventolin Hfa 108 (90 Base) Mcg/act Aers (Albuterol sulfate) .Marland Kitchen... 2 puffs q 4 hours   Problem # 2:  SINUSITIS (ICD-473.9) will cover sinusitis seen  on L side on CT Her updated medication list for this problem includes:    Augmentin 875-125 Mg Tabs (Amoxicillin-pot clavulanate) .Marland Kitchen... 1po two times a day for 10 days   Complete Medication List: 1)  Synthroid 125 Mcg Tabs (Levothyroxine sodium) .... Take 1 tablet by mouth once a day 2)  Vytorin 10-20 Mg Tabs (Ezetimibe-simvastatin) .... Take 1 tablet by mouth once a day 3)  Hydrochlorothiazide 12.5 Mg Tabs (Hydrochlorothiazide) .... Take 1 tablet by mouth once a day 4)  Benicar Hct 40-25 Mg Tabs (Olmesartan medoxomil-hctz) .... Take 1 tablet by mouth once a day 5)  Diclofenac Sodium 75 Mg Tbec (Diclofenac sodium) .... As needed 6)  Omeprazole 20 Mg Tbec (Omeprazole) .... Take 1 tablet by mouth two times a day 7)  Lipitor 20 Mg Tabs (Atorvastatin calcium) .... Take one by mouth q evening with a low fat snack 8)  Clobetasol Propionate 0.05 % Oint (Clobetasol propionate) .... Use as directed 9)  Augmentin 875-125 Mg Tabs (Amoxicillin-pot clavulanate) .Marland Kitchen.. 1po two times a day for 10 days  10)  Ventolin Hfa 108 (90 Base) Mcg/act Aers (Albuterol sulfate) .... 2 puffs q 4 hours   Patient Instructions: 1)  take augmentin as directed - for sinus infection and bronchitis 2)  use ventolin inhaler 2 puffs up to every 4 hours as needed for wheezing 3)  if more wheezing/tightness or any shortness of breath update me 4)  use mucinex or mucinex DM for cough and congestion- lots of fluids 5)  avoid any med with D- it will raise pulse and blood pressure    Prescriptions: VENTOLIN HFA 108 (90 BASE) MCG/ACT  AERS (ALBUTEROL SULFATE) 2 puffs q 4 hours  #1 MDI x 0   Entered and Authorized by:   Judith Part MD   Signed by:   Judith Part MD on 01/22/2007   Method used:   Print then Give to Patient   RxID:   [id number] AUGMENTIN 875-125 MG  TABS (AMOXICILLIN-POT CLAVULANATE) 1po two times a day for 10 days  #20 x 0   Entered and Authorized by:   Judith Part MD   Signed by:   Judith Part MD on 01/22/2007   Method used:   Print then Give to Patient   RxID:   [id number]  ] Prior Medications: SYNTHROID 125 MCG  TABS (LEVOTHYROXINE SODIUM) Take 1 tablet by mouth once a day VYTORIN 10-20 MG  TABS (EZETIMIBE-SIMVASTATIN) Take 1 tablet by mouth once a day HYDROCHLOROTHIAZIDE 12.5 MG  TABS (HYDROCHLOROTHIAZIDE) Take 1 tablet by mouth once a day BENICAR HCT 40-25 MG  TABS (OLMESARTAN MEDOXOMIL-HCTZ) Take 1 tablet by mouth once a day DICLOFENAC SODIUM 75 MG  TBEC (DICLOFENAC SODIUM) as needed OMEPRAZOLE 20 MG  TBEC (OMEPRAZOLE) Take 1 tablet by mouth two times a day LIPITOR 20 MG  TABS (ATORVASTATIN CALCIUM) take one by mouth q evening with a low fat snack Current Allergies: ! AVELOX ! LIPITOR ! * TRIAMINIC * CRESTOR ACE INHIBITORS ZOCOR * ZETIA * VYTORIN

## 2010-03-11 NOTE — Consult Note (Signed)
Summary: [facility]/Dr. Corliss Skains  [facility]/Dr. Corliss Skains   Imported By: Eleonore Chiquito 02/06/2007 12:06:26  _____________________________________________________________________  External Attachment:    Type:   Image     Comment:   External Document  Appended Document: Preload-Problems-Medications-Allergies      Past Medical History:    Hyperlipidemia    Hypertension    Rheumatoid arthritis    GERD    COPD    Diverticulosis, colon    Hypothyroidism    Osteoporosis    sero-negative rheumatoid arthritis    lichen sclerosis- back/abd/vulvar

## 2010-03-11 NOTE — Assessment & Plan Note (Signed)
Summary: STOMACH,WEAK/CLE   Vital Signs:  Patient profile:   77 year old female Height:      62 inches Weight:      160.75 pounds BMI:     29.51 Temp:     98.3 degrees F oral Pulse rate:   72 / minute Pulse rhythm:   regular BP sitting:   128 / 76  (left arm) Cuff size:   regular  Vitals Entered By: Lewanda Rife LPN (April 01, 2009 10:20 AM)  History of Present Illness: had an upset stomach on saturday  threw up a brown bile looking solution -- just once  no fever or other symptoms  after a busy day without time to eat or go to the bathroom   since then totally wiped out -- and not feeling well slept all day sunday and monday  feeling weak - gives out easily - but no cp or sob  is constipated and a lot of gas as well  last BM - small was saturday  no diarrhea   not been eating very regularly   does not feel like her colitis is acting up no blood in her stool  some rectal and bowel heaviness and bloating -- really uncomfortable  has lost wt -- unsure why / changed some of her cooking habits  has lost 15 lb at most in the past year thinks her last colonoscopy was about 4 years ago (a lot of complications with that )  [facility] - in the past and [facility] before that  if she had endo in past ?    has never had a bleeding ulcer   stress is always very high  no time to eat or go to the bathroom   is not on omeprazole now - has been on in past  no nsaids and no pain med (despite her chronic )     Allergies: 1)  ! Avelox 2)  ! Lipitor 3)  ! * Triaminic 4)  * Crestor 5)  Ace Inhibitors 6)  Zocor 7)  * Zetia 8)  * Vytorin  Past History:  Past Medical History: Last updated: 02/08/2007 Hyperlipidemia Hypertension Rheumatoid arthritis GERD COPD Diverticulosis, colon Hypothyroidism Osteoporosis sero-negative rheumatoid arthritis lichen sclerosis- back/abd/vulvar  Past Surgical History: Last updated: 01/22/2007 Hysterectomy (1968) Hospital- ischemic  colitis, ? infect (06/202) Spinal stenosis Dexa- OP, heel (2001) Cardiolite- neg (02/2002) Ischemic colitis Diverticulosis (07/2002) Sinus surgery (05/2002) Dexa- OP hip T-2.94 Fibula fracture (2006) Echo- normal LVF EF 55-65% (11/2004) Admit- right mastoiditis (04/2005) Nuclear stress study- normal (12/2005)  Family History: Last updated: 03-12-07 Father: died age 28- MI Mother:  Siblings: sister with CAD, bypass.  brother- MI GF CAD  Social History: Last updated: March 12, 2007 Marital Status: Married Children: 3 Occupation: retired lots of volunteering- [employer], cares for disabled son  Risk Factors: Smoking Status: quit (12/16/2006)  Review of Systems General:  Denies chills, fatigue, fever, and loss of appetite. Eyes:  Denies blurring and eye pain. ENT:  Denies postnasal drainage, sinus pressure, and sore throat. CV:  Denies chest pain or discomfort, lightheadness, palpitations, and shortness of breath with exertion. Resp:  Denies cough and wheezing. GI:  Complains of abdominal pain, change in bowel habits, gas, indigestion, nausea, and vomiting; denies bloody stools, dark tarry stools, diarrhea, vomiting blood, and yellowish skin color. GU:  Denies dysuria and urinary frequency. MS:  Complains of joint pain, low back pain, and mid back pain. Derm:  Denies lesion(s), poor wound healing, and  rash. Neuro:  Denies headaches, numbness, and tingling. Psych:  Complains of anxiety; denies panic attacks, sense of great danger, and suicidal thoughts/plans. Endo:  Denies excessive thirst and excessive urination. Heme:  Denies abnormal bruising and bleeding.  Physical Exam  General:  Well-developed,well-nourished,in no acute distress; alert,appropriate and cooperative throughout examination Head:  normocephalic, atraumatic, and no abnormalities observed.   Eyes:  vision grossly intact, pupils equal, pupils round, and pupils reactive to light.  no conjunctival pallor,  injection or icterus  Mouth:  pharynx pink and moist.   Neck:  No deformities, masses, or tenderness noted. Lungs:  Normal respiratory effort, chest expands symmetrically. Lungs are clear to auscultation, no crackles or wheezes. Heart:  Normal rate and regular rhythm. S1 and S2 normal without gallop, murmur, click, rub or other extra sounds. Abdomen:  mild epigastric tenderness wihtout rebound or gaurding  mild bilat LQ tenderness soft, normal bowel sounds, no distention, no masses, no hepatomegaly, and no splenomegaly.   Msk:  no CVA tenderness  Extremities:  No clubbing, cyanosis, edema, or deformity noted with normal full range of motion of all joints.   Neurologic:  sensation intact to light touch.   Skin:  Intact without suspicious lesions or rashes no pallor or jaundice  nl cap refil time  Cervical Nodes:  No lymphadenopathy noted Inguinal Nodes:  No significant adenopathy Psych:  pt seems stressed today talks freely about situational stress  not tearful  nl eye contact and comm skills   Impression & Recommendations:  Problem # 1:  GASTRITIS (ICD-535.50) Assessment New  with episode of ? dark emesis  will start back on omeprazole suspect stress plays a factor  heme neg stool today  labs now and f/u 1 week  Her updated medication list for this problem includes:    Omeprazole 20 Mg Cpdr (Omeprazole) .Marland Kitchen... 1 by mouth once daily in am  Orders: Venipuncture (16606) TLB-CBC Platelet - w/Differential (85025-CBCD) TLB-BMP (Basic Metabolic Panel-BMET) (80048-METABOL) TLB-Hepatic/Liver Function Pnl (80076-HEPATIC) Prescription Created Electronically [phone])  Problem # 2:  ABDOMINAL BLOATING (ICD-787.3) Assessment: New  with some constipation and remote hx of colitis  also some wt loss (per pt from dietary change) may need early f/u for colonosc - will try probiotics and f/u 1 week to disc further  Orders: Venipuncture (10932) TLB-CBC Platelet - w/Differential  (85025-CBCD) TLB-BMP (Basic Metabolic Panel-BMET) (80048-METABOL) TLB-Hepatic/Liver Function Pnl (80076-HEPATIC)  Problem # 3:  STRESS REACTION, ACUTE, WITH EMOTIONAL DISTURBANCE (ICD-308.0) Assessment: Deteriorated much stress and need to cut down obligations so she can take care of herself  this is obvious to both of [place]  disc symptoms / tx optons/ coping tech and support sources in detail today  spent over 30 min face to face with pt regarding this 50% counseling  Complete Medication List: 1)  Hydrochlorothiazide 12.5 Mg Tabs (Hydrochlorothiazide) .... Take 1 tablet by mouth once a day (holding for low bp) 2)  Benicar Hct 40-25 Mg Tabs (Olmesartan medoxomil-hctz) .... Take 1 tablet by mouth once a day 3)  Lipitor 40 Mg Tabs (Atorvastatin calcium) .Marland Kitchen.. 1 by mouth q evening with a low fat snack 4)  Vicodin 5-500 Mg Tabs (Hydrocodone-acetaminophen) .... One by mouth two times a day prn 5)  Advair Diskus 250-50 Mcg/dose Misc (Fluticasone-salmeterol) .Marland Kitchen.. 1 puff two times a day as needed 6)  Synthroid 112 Mcg Tabs (Levothyroxine sodium) .Marland Kitchen.. 1 each day for thyroid by mouth, brand name medically necessary 7)  Fish Oil Oil (Fish oil) .... Take one daily  8)  Vitamin B-12 500 Mcg Tabs (Cyanocobalamin) .... Take one tablet daily 9)  Vitamin D 1000 Unit Tabs (Cholecalciferol) .... Take one tablet daily 10)  Omeprazole 20 Mg Cpdr (Omeprazole) .Marland Kitchen.. 1 by mouth once daily in am  Patient Instructions: 1)  I think you need to cut your obligations to take care of yourself and get rest  2)  start back on omeprazole once daily in am (first dose now)  3)  update me asap if vomiting returns  4)  labs today  5)  stool was negative for blood today  6)  try yogurt or otc align - probiotic to get bowels moving and try to eat a more regular diet  7)  follow up with me in 7-10 days  Prescriptions: OMEPRAZOLE 20 MG CPDR (OMEPRAZOLE) 1 by mouth once daily in am  #30 x 11   Entered and Authorized by:   Judith Part MD   Signed by:   Judith Part MD on 04/01/2009   Method used:   Electronically to        [facility] [phone]* (retail)       [address]       [place], [place]  [address]       Ph: [phone] or [phone]       Fax: [phone]   RxID:   [id number]   Current Allergies (reviewed today): ! AVELOX ! LIPITOR ! * TRIAMINIC * CRESTOR ACE INHIBITORS ZOCOR * ZETIA * VYTORIN

## 2010-03-11 NOTE — Consult Note (Signed)
Summary: [facility]/Dr. Deveshwar  [facility]/Dr. Deveshwar   Imported By: Eleonore Chiquito 07/03/2007 15:53:37  _____________________________________________________________________  External Attachment:    Type:   Image     Comment:   External Document

## 2010-03-11 NOTE — Letter (Signed)
Summary: [facility]  [facility]   Imported By: Lanelle Bal 05/07/2009 11:19:54  _____________________________________________________________________  External Attachment:    Type:   Image     Comment:   External Document

## 2010-03-11 NOTE — Miscellaneous (Signed)
Summary: [facility]- lipitor, benicar  Medications Added BENICAR HCT 40-25 MG  TABS (OLMESARTAN MEDOXOMIL-HCTZ) Take 1 tablet by mouth once a day (HOLDING FOR LOW BP) BENICAR HCT 40-25 MG  TABS (OLMESARTAN MEDOXOMIL-HCTZ) Take 1 tablet by mouth once a day       Clinical Lists Changes I had noted that we were holding her benicar for low bp-- please verify with pt what her status is on this -- and I will do px for [facility]- they requested benicar hct and also lipitor please send back to me  Medications: Changed medication from BENICAR HCT 40-25 MG  TABS (OLMESARTAN MEDOXOMIL-HCTZ) Take 1 tablet by mouth once a day (HOLDING FOR LOW BP) to BENICAR HCT 40-25 MG  TABS (OLMESARTAN MEDOXOMIL-HCTZ) Take 1 tablet by mouth once a day (HOLDING FOR LOW BP) - Signed Changed medication from BENICAR HCT 40-25 MG  TABS (OLMESARTAN MEDOXOMIL-HCTZ) Take 1 tablet by mouth once a day (HOLDING FOR LOW BP) to BENICAR HCT 40-25 MG  TABS (OLMESARTAN MEDOXOMIL-HCTZ) Take 1 tablet by mouth once a day - Signed Rx of BENICAR HCT 40-25 MG  TABS (OLMESARTAN MEDOXOMIL-HCTZ) Take 1 tablet by mouth once a day;  #90 x 3;  Signed;  Entered by: Judith Part MD;  Authorized by: Judith Part MD;  Method used: Printed then faxed to  Rx of LIPITOR 40 MG  TABS (ATORVASTATIN CALCIUM) 1 by mouth q evening with a low fat snack;  #90 x 3;  Signed;  Entered by: Judith Part MD;  Authorized by: Judith Part MD;  Method used: Printed then faxed to  Notified patient she states she is taking the benicar and the lipitor also needs [facility]. [facility] Crosby  August 08, 2008 10:53 AM    ok px printed out for fax   Prescriptions: LIPITOR 40 MG  TABS (ATORVASTATIN CALCIUM) 1 by mouth q evening with a low fat snack  #90 x 3   Entered and Authorized by:   Judith Part MD   Signed by:   Judith Part MD on 08/08/2008   Method used:   Printed then faxed to ...         RxID:    [id number] BENICAR HCT 40-25 MG  TABS (OLMESARTAN MEDOXOMIL-HCTZ) Take 1 tablet by mouth once a day  #90 x 3   Entered and Authorized by:   Judith Part MD   Signed by:   Judith Part MD on 08/08/2008   Method used:   Printed then faxed to ...         RxID:   [id number]   Appended Document: [facility] lipitor, benicar prescribtions [facility] 08/07/2008 [phone]

## 2010-03-11 NOTE — Assessment & Plan Note (Signed)
Summary: Follow up   Vital Signs:  Patient Profile:   77 Years Old Female Weight:      168 pounds Temp:     97.8 degrees F oral Pulse rate:   64 / minute Pulse rhythm:   regular BP sitting:   150 / 80  (left arm) Cuff size:   large  Vitals Entered By: Lowella Petties (March 07, 2007 8:21 AM)                 Chief Complaint:  Follow up.  History of Present Illness: is overall doing fairly well  sinus symptoms today- with rainy weather- gets some sinus pressure - worse if she lies flat without vertigo  weight is up- eating chocolate and comfort food- with stress playing role now has started cutting down on calories needs to stop stress eating  is starting to back off of responsibilities- is "on call " for family members all day and night has pulled back on some activities- priority is taking care of her son  is on her feet all day, very active  has treadmill she is interested in using  chol- december- LDL 204, HDL 57  is on the lipitor now- but out of it - since the draw- does not think she is having any side effects ? if vytorin caused joint probs (? or was it RA)- no specialized tx (Dr Jon Billings)  did have her gyn visit in dec some hot flashes- is up all night  did not take any blood pressure med this am    a little more short of breath- only when she is overextended   Current Allergies: ! AVELOX ! LIPITOR ! * TRIAMINIC * CRESTOR ACE INHIBITORS ZOCOR * ZETIA * VYTORIN   Family History:    Reviewed history from 01/22/2007 and no changes required:       Father: died age 72- MI       Mother:        Siblings: sister with CAD, bypass.  brother- MI       GF CAD  Social History:    Marital Status: Married    Children: 3    Occupation: retired    lots of volunteering- [employer], cares for disabled son   Risk Factors:  Colonoscopy History:     Date of Last Colonoscopy:  08/01/2002    Results:  Diverticulosis    Review of Systems  See HPI  General      Denies fatigue, loss of appetite, and weight loss.  Eyes      Denies blurring.  CV      Denies chest pain or discomfort and palpitations.  Resp      Denies cough.  GI      Denies change in bowel habits.  MS      Complains of joint pain.      Denies joint redness and joint swelling.  Derm      Denies rash.  Neuro      Denies numbness.  Psych      mood is overall ok despite stress   Physical Exam  General:     Well-developed,well-nourished,in no acute distress; alert,appropriate and cooperative throughout examination Head:     normocephalic, atraumatic, and no abnormalities observed.   Eyes:     vision grossly intact, pupils equal, pupils round, and pupils reactive to light.   Mouth:     pharynx pink and moist.   Neck:     supple  with full rom and no masses or thyromegally, no JVD or carotid bruit  Lungs:     Normal respiratory effort, chest expands symmetrically. Lungs are clear to auscultation, no crackles or wheezes. Heart:     baseline 2/6 Murmur RRR, no gallops Abdomen:     soft and non-tender.  no renal bruits Msk:     No deformity or scoliosis noted of thoracic or lumbar spine.  - no acute joint changes today Pulses:     R and L carotid,radial,femoral,dorsalis pedis and posterior tibial pulses are full and equal bilaterally Extremities:     No clubbing, cyanosis, edema, or deformity noted with normal full range of motion of all joints.   Neurologic:     sensation intact to light touch, gait normal, and DTRs symmetrical and normal.   Skin:     Intact without suspicious lesions or rashes Cervical Nodes:     No lymphadenopathy noted Psych:     nl affect, talkative and pleasant    Impression & Recommendations:  Problem # 1:  HYPOTHYROIDISM (ICD-244.9) will check tsh and advise clinically stable Her updated medication list for this problem includes:    Synthroid 125 Mcg Tabs (Levothyroxine sodium) .Marland Kitchen... Take 1 tablet by  mouth once a day  Orders: TLB-TSH (Thyroid Stimulating Hormone) (84443-TSH)   Problem # 2:  HYPERTENSION (ICD-401.9) blood pressure is in good control when on med renal labs today Her updated medication list for this problem includes:    Hydrochlorothiazide 12.5 Mg Tabs (Hydrochlorothiazide) .Marland Kitchen... Take 1 tablet by mouth once a day    Benicar Hct 40-25 Mg Tabs (Olmesartan medoxomil-hctz) .Marland Kitchen... Take 1 tablet by mouth once a day  Orders: Venipuncture (40102) TLB-Renal Function Panel (80069-RENAL) TLB-CBC Platelet - w/Differential (85025-CBCD)   Problem # 3:  RHEUMATOID ARTHRITIS (ICD-714.0) stable without tx at this time- continuing rheum f/u Her updated medication list for this problem includes:    Diclofenac Sodium 75 Mg Tbec (Diclofenac sodium) .Marland Kitchen... As needed   Problem # 4:  HYPERLIPIDEMIA (ICD-272.4) check labs on lipitor today- seems to be tolerating it disc diet and exercise plan The following medications were removed from the medication list:    Vytorin 10-20 Mg Tabs (Ezetimibe-simvastatin) .Marland Kitchen... Take 1 tablet by mouth once a day  Her updated medication list for this problem includes:    Lipitor 20 Mg Tabs (Atorvastatin calcium) .Marland Kitchen... Take one by mouth q evening with a low fat snack  Orders: Venipuncture (72536) TLB-Lipid Panel (80061-LIPID) TLB-ALT (SGPT) (84460-ALT) TLB-AST (SGOT) (84450-SGOT)   Problem # 5:  DYSPNEA ON EXERTION (ICD-786.09) will gradually increase exercise- in 5 min intervals if sob does not resolve (or if chest pain or other sympt)- update me nl stress nuc study fall 07- with numerous CV risk factors Her updated medication list for this problem includes:    Hydrochlorothiazide 12.5 Mg Tabs (Hydrochlorothiazide) .Marland Kitchen... Take 1 tablet by mouth once a day    Benicar Hct 40-25 Mg Tabs (Olmesartan medoxomil-hctz) .Marland Kitchen... Take 1 tablet by mouth once a day    Ventolin Hfa 108 (90 Base) Mcg/act Aers (Albuterol sulfate) .Marland Kitchen... 2 puffs q 4 hours    Complete Medication List: 1)  Synthroid 125 Mcg Tabs (Levothyroxine sodium) .... Take 1 tablet by mouth once a day 2)  Hydrochlorothiazide 12.5 Mg Tabs (Hydrochlorothiazide) .... Take 1 tablet by mouth once a day 3)  Benicar Hct 40-25 Mg Tabs (Olmesartan medoxomil-hctz) .... Take 1 tablet by mouth once a day 4)  Diclofenac Sodium 75 Mg  Tbec (Diclofenac sodium) .... As needed 5)  Omeprazole 20 Mg Tbec (Omeprazole) .... Take 1 tablet by mouth two times a day 6)  Lipitor 20 Mg Tabs (Atorvastatin calcium) .... Take one by mouth q evening with a low fat snack 7)  Clobetasol Propionate 0.05 % Oint (Clobetasol propionate) .... Use as directed 8)  Ventolin Hfa 108 (90 Base) Mcg/act Aers (Albuterol sulfate) .... 2 puffs q 4 hours 9)  Nasacort  .... 2 sprays in each nostril qd   Patient Instructions: 1)  work on getting more exercise- very gradually- and update me if short of breath 2)  work on diet for weight loss 3)  we will make plan based on labs    Prescriptions: BENICAR HCT 40-25 MG  TABS (OLMESARTAN MEDOXOMIL-HCTZ) Take 1 tablet by mouth once a day  #90 x 3   Entered and Authorized by:   Judith Part MD   Signed by:   Judith Part MD on 03/07/2007   Method used:   Print then Give to Patient   RxID:   [id number] LIPITOR 20 MG  TABS (ATORVASTATIN CALCIUM) take one by mouth q evening with a low fat snack  #90 x 3   Entered and Authorized by:   Judith Part MD   Signed by:   Judith Part MD on 03/07/2007   Method used:   Print then Give to Patient   RxID:   [id number]  ] Prior Medications: SYNTHROID 125 MCG  TABS (LEVOTHYROXINE SODIUM) Take 1 tablet by mouth once a day HYDROCHLOROTHIAZIDE 12.5 MG  TABS (HYDROCHLOROTHIAZIDE) Take 1 tablet by mouth once a day BENICAR HCT 40-25 MG  TABS (OLMESARTAN MEDOXOMIL-HCTZ) Take 1 tablet by mouth once a day DICLOFENAC SODIUM 75 MG  TBEC (DICLOFENAC SODIUM) as needed OMEPRAZOLE 20 MG  TBEC (OMEPRAZOLE) Take 1 tablet by mouth  two times a day LIPITOR 20 MG  TABS (ATORVASTATIN CALCIUM) take one by mouth q evening with a low fat snack CLOBETASOL PROPIONATE 0.05 %  OINT (CLOBETASOL PROPIONATE) use as directed VENTOLIN HFA 108 (90 BASE) MCG/ACT  AERS (ALBUTEROL SULFATE) 2 puffs q 4 hours Current Allergies: ! AVELOX ! LIPITOR ! * TRIAMINIC * CRESTOR ACE INHIBITORS Mallie Mussel * VYTORIN   Preventive Care Screening  Colonoscopy:    Date:  08/01/2002    Results:  Diverticulosis

## 2010-03-11 NOTE — Assessment & Plan Note (Signed)
Summary: flu shot   Nurse Visit    Prior Medications: SYNTHROID 125 MCG  TABS (LEVOTHYROXINE SODIUM) Take 1 tablet by mouth once a day HYDROCHLOROTHIAZIDE 12.5 MG  TABS (HYDROCHLOROTHIAZIDE) Take 1 tablet by mouth once a day (HOLDING FOR LOW BP) BENICAR HCT 40-25 MG  TABS (OLMESARTAN MEDOXOMIL-HCTZ) Take 1 tablet by mouth once a day (HOLDING FOR LOW BP) LIPITOR 40 MG  TABS (ATORVASTATIN CALCIUM) 1 by mouth q evening with a low fat snack VICODIN 5-500 MG TABS (HYDROCODONE-ACETAMINOPHEN) one by mouth two times a day prn CYCLOBENZAPRINE HCL 10 MG TABS (CYCLOBENZAPRINE HCL) one by mouth every 6 hours as needed Current Allergies: ! AVELOX ! LIPITOR ! * TRIAMINIC * CRESTOR ACE INHIBITORS ZOCOR * ZETIA * VYTORIN    Orders Added: 1)  Flu Vaccine 28yrs + [90658] 2)  Admin of Therapeutic Inj (IM or Magdalena) Lepidus.Putnam    ]  Flu Vaccine Consent Questions     Do you have a history of severe allergic reactions to this vaccine? no    Any prior history of allergic reactions to egg and/or gelatin? no    Do you have a sensitivity to the preservative Thimersol? no    Do you have a past history of Guillan-Barre Syndrome? no    Do you currently have an acute febrile illness? no    Have you ever had a severe reaction to latex? no    Vaccine information given and explained to patient? yes    Are you currently pregnant? no    Lot Number:[id number]   Site Given  Left Deltoid IMu  Clarise Cruz [facility])  October 26, 2007 4:08 PM

## 2010-03-11 NOTE — Progress Notes (Signed)
Summary: needs thyroid labs  Phone Note Call from Patient Call back at Home Phone [phone]   Caller: Patient Call For: Dawn Aguirre Summary of Call: Pt is overdue for thyroid tests, please advise on what you want to order.  She is feeling lousy and I told her her levels could be off, she thought it could be from  switching to a generic synthroid, but I told her she was to have scheduled this before and she never did. Initial call taken by: Lowella Petties,  March 25, 2008 3:07 PM  Follow-up for Phone Call        please schedule for TSH  --244.9 and be sure her dose on med sheet is correct  Dawn Tyler Deis FNP  March 25, 2008 4:48 PM   Additional Follow-up for Phone Call Additional follow up Details #1::        Patient notified as instructed.  Was advised by patient that she is taking Levothyroxine 125 micrograms.  Lab appt scheduled for 03/28/08 at 10:00 AM. Additional Follow-up by: Sydell Axon,  March 25, 2008 5:00 PM

## 2010-03-11 NOTE — Medication Information (Signed)
Summary: [facility]   Imported By: Carin Primrose 03/29/2007 10:51:06  _____________________________________________________________________  External Attachment:    Type:   Image     Comment:   External Document

## 2010-03-11 NOTE — Assessment & Plan Note (Signed)
Summary: ROA 7 TO 10 DAYS CYD   Vital Signs:  Patient profile:   77 year old female Weight:      164 pounds BMI:     30.10 Temp:     98.2 degrees F oral Pulse rate:   64 / minute Pulse rhythm:   regular BP sitting:   128 / 60  (left arm) Cuff size:   regular  Vitals Entered By: Mervin Hack CMA Duncan Dull) (April 08, 2009 2:09 PM) CC: follow-up visit   History of Present Illness: here for f/u of gastritis/ bloating and stress  is feeling much better than she was last time   still stress- many meetings -- and this is playing a role  is too busy to take care of herself  knows this means to change talked to her family about it  is examining her priorities in detail and starting to write them down  not getting good sleep  has to care for her son with down's full time -and organize his life as well as her husbands  wt is up 4 lb from last visit   bp good   started omeprazole -- this helps a lot / acid reflux is better   adv to start align-- has not started that yet  did buy some yogurt yesterday did add some metamucil needs to increase her water intake -  knows this  constipation is just a little better now  when she does mover her bowels-- has a lot of volume and feels better  still a lot of bloat and pressure  would like to try the probiotic before following up with GI    labs all ok - to review   has been having night sweats and clammy around her neck mouth stays dry   needs glasses and has another cataract surg coming up   gyn visit is coming up   Allergies: 1)  ! Avelox 2)  ! Lipitor 3)  ! * Triaminic 4)  * Crestor 5)  Ace Inhibitors 6)  Zocor 7)  * Zetia 8)  * Vytorin  Past History:  Past Medical History: Last updated: 02/08/2007 Hyperlipidemia Hypertension Rheumatoid arthritis GERD COPD Diverticulosis, colon Hypothyroidism Osteoporosis sero-negative rheumatoid arthritis lichen sclerosis- back/abd/vulvar  Past Surgical History: Last  updated: 01/22/2007 Hysterectomy (1968) Hospital- ischemic colitis, ? infect (06/202) Spinal stenosis Dexa- OP, heel (2001) Cardiolite- neg (02/2002) Ischemic colitis Diverticulosis (07/2002) Sinus surgery (05/2002) Dexa- OP hip T-2.94 Fibula fracture (2006) Echo- normal LVF EF 55-65% (11/2004) Admit- right mastoiditis (04/2005) Nuclear stress study- normal (12/2005)  Family History: Last updated: 31-Mar-2007 Father: died age 61- MI Mother:  Siblings: sister with CAD, bypass.  brother- MI GF CAD  Social History: Last updated: 03-31-2007 Marital Status: Married Children: 3 Occupation: retired lots of volunteering- [employer], cares for disabled son  Risk Factors: Smoking Status: quit (12/16/2006)  Review of Systems General:  Complains of fatigue; denies chills, fever, loss of appetite, and malaise. Eyes:  Denies blurring and eye irritation. CV:  Denies chest pain or discomfort, lightheadness, palpitations, shortness of breath with exertion, and swelling of feet. Resp:  Denies cough and shortness of breath. GI:  Complains of abdominal pain and constipation; denies bloody stools, dark tarry stools, diarrhea, indigestion, loss of appetite, nausea, and vomiting. GU:  Denies dysuria and urinary frequency. MS:  Complains of joint pain and low back pain. Derm:  Denies itching, lesion(s), poor wound healing, and rash. Neuro:  Denies headaches, numbness, tingling, and  tremors. Psych:  Complains of irritability; denies panic attacks, sense of great danger, and suicidal thoughts/plans. Endo:  Denies cold intolerance and heat intolerance. Heme:  Denies abnormal bruising and bleeding.  Physical Exam  General:  Well-developed,well-nourished,in no acute distress; alert,appropriate and cooperative throughout examination Head:  normocephalic, atraumatic, and no abnormalities observed.   Eyes:  vision grossly intact, pupils equal, pupils round, and pupils reactive to light.  no  conjunctival pallor, injection or icterus  Mouth:  pharynx pink and moist.   Neck:  supple with full rom and no masses or thyromegally, no JVD or carotid bruit  Lungs:  Normal respiratory effort, chest expands symmetrically. Lungs are clear to auscultation, no crackles or wheezes. Heart:  Normal rate and regular rhythm. S1 and S2 normal without gallop, murmur, click, rub or other extra sounds. Abdomen:  some mild LQ tenderness bilat without rebound or gaurding  no epigastric tenderness or murphy sign  soft, normal bowel sounds, no hepatomegaly, and no splenomegaly.   Msk:  No deformity or scoliosis noted of thoracic or lumbar spine.   Extremities:  No clubbing, cyanosis, edema, or deformity noted with normal full range of motion of all joints.   Neurologic:  sensation intact to light touch, gait normal, and DTRs symmetrical and normal.  no tremor  Skin:  Intact without suspicious lesions or rashes Cervical Nodes:  No lymphadenopathy noted Inguinal Nodes:  No significant adenopathy Psych:  seems fatigued and mildly anx   Impression & Recommendations:  Problem # 1:  GASTRITIS (ICD-535.50) Assessment Improved this and gerd are much improved on omeprazole  disc diet/ lifestyle/ stress  f/u 3 mo but update sooner if symptoms return  Her updated medication list for this problem includes:    Omeprazole 20 Mg Cpdr (Omeprazole) .Marland Kitchen... 1 by mouth once daily in am  Problem # 2:  ABDOMINAL BLOATING (ICD-787.3) Assessment: Improved some imp with fiber  adv again to try align otc daily for 2 weeks with much inc water intake and then udate  if no further imp needs GI f/u and she is aware of this   Problem # 3:  STRESS REACTION, ACUTE, WITH EMOTIONAL DISTURBANCE (ICD-308.0) Assessment: Unchanged again- very long conversation about obligations/ priorities and time to care for herself  disc her coping skills/ situational stress/ opt for tx and poss side eff in detail  again - counseling  offered plan to re -org her priorities and give up some responsibilities to have time to care for herself and family spent over 30 min with pt face to face time 50% of that in counseling and coordination of care   Complete Medication List: 1)  Hydrochlorothiazide 12.5 Mg Tabs (Hydrochlorothiazide) .... Take 1 tablet by mouth once a day (holding for low bp) 2)  Benicar Hct 40-25 Mg Tabs (Olmesartan medoxomil-hctz) .... Take 1 tablet by mouth once a day 3)  Lipitor 40 Mg Tabs (Atorvastatin calcium) .Marland Kitchen.. 1 by mouth q evening with a low fat snack 4)  Vicodin 5-500 Mg Tabs (Hydrocodone-acetaminophen) .... One by mouth two times a day prn 5)  Advair Diskus 250-50 Mcg/dose Misc (Fluticasone-salmeterol) .Marland Kitchen.. 1 puff two times a day as needed 6)  Synthroid 112 Mcg Tabs (Levothyroxine sodium) .Marland Kitchen.. 1 each day for thyroid by mouth, brand name medically necessary 7)  Fish Oil Oil (Fish oil) .... Take one daily 8)  Vitamin B-12 500 Mcg Tabs (Cyanocobalamin) .... Take one tablet daily 9)  Vitamin D 1000 Unit Tabs (Cholecalciferol) .... Take one tablet daily  10)  Omeprazole 20 Mg Cpdr (Omeprazole) .Marland Kitchen.. 1 by mouth once daily in am 11)  Metamucil Smooth Texture 58.6 % Powd (Psyllium) .... Take once daily  Patient Instructions: 1)  continue the omeprazole and do not miss doses  2)  if reflux increases or any abdominal pain please let me know  3)  start align over the counter 1 pill daily for 2 weeks 4)  if abdominal bloating and constipation do not improve at that time -- call so I can get you a follow up with GI 5)  follow up with me in about 3 months   Current Allergies (reviewed today): ! AVELOX ! LIPITOR ! * TRIAMINIC * CRESTOR ACE INHIBITORS ZOCOR * ZETIA * VYTORIN

## 2010-03-11 NOTE — Medication Information (Signed)
Summary: [facility] Prior Auth Request-Lipitor 20 mg  [facility] Prior Auth Request-Lipitor 20 mg   Imported By: Beau Fanny 03/20/2007 11:14:20  _____________________________________________________________________  External Attachment:    Type:   Image     Comment:   External Document

## 2010-03-11 NOTE — Consult Note (Signed)
Summary: [facility]/Consultation Report/Dr. Corliss Skains  [facility]/Consultation Report/Dr. Corliss Skains   Imported By: Mickle Asper 06/20/2007 14:15:06  _____________________________________________________________________  External Attachment:    Type:   Image     Comment:   External Document

## 2010-03-11 NOTE — Letter (Signed)
Summary: [facility] / [facility] / DR. SHAILI DEVES  [facility] / [facility] / DR. SHAILI DEVESHWAR   Imported By: Carin Primrose 03/20/2008 15:53:56  _____________________________________________________________________  External Attachment:    Type:   Image     Comment:   External Document

## 2010-03-11 NOTE — Assessment & Plan Note (Signed)
Summary: SINUS SYMPTOMS   Vital Signs:  Patient profile:   77 year old female Height:      62 inches Weight:      163.25 pounds BMI:     29.97 Temp:     98.3 degrees F oral Pulse rate:   64 / minute Pulse rhythm:   regular BP sitting:   138 / 80  (left arm) Cuff size:   regular  Vitals Entered By: Linde Gillis CMA Duncan Dull) (Jul 02, 2009 11:36 AM) CC: cold, chest congestion, headache, fever   History of Present Illness: 77 yo here for ?sinus infection.  For over 1 week, progressive symtpoms. Started with runny nose, dry cough. Now has facial pressure, body aches, chills, subjective fever. Cough has become productive, feels like she may be wheezing at times. Not taking anything OTC.  Current Medications (verified): 1)  Hydrochlorothiazide 12.5 Mg  Tabs (Hydrochlorothiazide) .... Take 1 Tablet By Mouth Once A Day (Holding For Low Bp) 2)  Benicar Hct 40-25 Mg  Tabs (Olmesartan Medoxomil-Hctz) .... Take 1 Tablet By Mouth Once A Day 3)  Lipitor 40 Mg  Tabs (Atorvastatin Calcium) .Marland Kitchen.. 1 By Mouth Q Evening With A Low Fat Snack 4)  Vicodin 5-500 Mg Tabs (Hydrocodone-Acetaminophen) .... One By Mouth Two Times A Day Prn 5)  Advair Diskus 250-50 Mcg/dose Misc (Fluticasone-Salmeterol) .Marland Kitchen.. 1 Puff Two Times A Day As Needed 6)  Synthroid 112 Mcg Tabs (Levothyroxine Sodium) .Marland Kitchen.. 1 Each Day For Thyroid By Mouth, Brand Name Medically Necessary 7)  Fish Oil   Oil (Fish Oil) .... Take One Daily 8)  Vitamin B-12 500 Mcg  Tabs (Cyanocobalamin) .... Take One Tablet Daily 9)  Vitamin D 1000 Unit  Tabs (Cholecalciferol) .... Take One Tablet Daily 10)  Omeprazole 20 Mg Cpdr (Omeprazole) .Marland Kitchen.. 1 By Mouth Once Daily in Am 11)  Metamucil Smooth Texture 58.6 % Powd (Psyllium) .... Take Once Daily 12)  Azithromycin 250 Mg  Tabs (Azithromycin) .... 2 By  Mouth Today and Then 1 Daily For 4 Days  Allergies: 1)  ! Avelox 2)  ! * Triaminic 3)  * Crestor 4)  Ace Inhibitors 5)  Zocor 6)  * Zetia 7)  *  Vytorin  Review of Systems      See HPI General:  Complains of chills, fatigue, and fever. ENT:  Complains of nasal congestion, postnasal drainage, sinus pressure, and sore throat; denies earache. Resp:  Complains of cough, sputum productive, and wheezing; denies shortness of breath.  Physical Exam  General:  Well-developed,well-nourished,in no acute distress; alert,appropriate and cooperative throughout examination Ears:  TMs retracted bilaterally Nose:  boggy turbinates. sinuses +/- tender Mouth:  pharynx pink and moist.   Lungs:  Normal respiratory effort, chest expands symmetrically. Lungs are clear to auscultation, no crackles or wheezes. Heart:  Normal rate and regular rhythm. S1 and S2 normal without gallop, murmur, click, rub or other extra sounds. Extremities:  No clubbing, cyanosis, edema, or deformity noted with normal full range of motion of all joints.   Psych:  seems fatigued and mildly anx   Impression & Recommendations:  Problem # 1:  OTHER ACUTE SINUSITIS (ICD-461.8) Assessment New  Given duration and progression of symptoms, will treat for bacterial sinusitis. See pt instructions for details. Her updated medication list for this problem includes:    Azithromycin 250 Mg Tabs (Azithromycin) .Marland Kitchen... 2 by  mouth today and then 1 daily for 4 days  Orders: Prescription Created Electronically [phone])  Complete Medication List: 1)  Hydrochlorothiazide 12.5 Mg Tabs (Hydrochlorothiazide) .... Take 1 tablet by mouth once a day (holding for low bp) 2)  Benicar Hct 40-25 Mg Tabs (Olmesartan medoxomil-hctz) .... Take 1 tablet by mouth once a day 3)  Lipitor 40 Mg Tabs (Atorvastatin calcium) .Marland Kitchen.. 1 by mouth q evening with a low fat snack 4)  Vicodin 5-500 Mg Tabs (Hydrocodone-acetaminophen) .... One by mouth two times a day prn 5)  Advair Diskus 250-50 Mcg/dose Misc (Fluticasone-salmeterol) .Marland Kitchen.. 1 puff two times a day as needed 6)  Synthroid 112 Mcg Tabs (Levothyroxine  sodium) .Marland Kitchen.. 1 each day for thyroid by mouth, brand name medically necessary 7)  Fish Oil Oil (Fish oil) .... Take one daily 8)  Vitamin B-12 500 Mcg Tabs (Cyanocobalamin) .... Take one tablet daily 9)  Vitamin D 1000 Unit Tabs (Cholecalciferol) .... Take one tablet daily 10)  Omeprazole 20 Mg Cpdr (Omeprazole) .Marland Kitchen.. 1 by mouth once daily in am 11)  Metamucil Smooth Texture 58.6 % Powd (Psyllium) .... Take once daily 12)  Azithromycin 250 Mg Tabs (Azithromycin) .... 2 by  mouth today and then 1 daily for 4 days  Patient Instructions: 1)  Take Zpack as directed.  Drink lots of fluids.  Treat sympotmatically with Mucinex, nasal saline irrigation, and Tylenol/Ibuprofen.  You can use warm compresses.   Call if not improving as expected in 5-7 days.  Prescriptions: AZITHROMYCIN 250 MG  TABS (AZITHROMYCIN) 2 by  mouth today and then 1 daily for 4 days  #6 x 0   Entered and Authorized by:   Ruthe Mannan MD   Signed by:   Ruthe Mannan MD on 07/02/2009   Method used:   [facility]        [facility] [facility]retail[facility]       [address]       [place], [place]  [address]       Ph: [phone] or [phone]       Fax: [phone]   RxID:   [id number]   Current Allergies (reviewed today): ! AVELOX ! * TRIAMINIC * CRESTOR ACE INHIBITORS ZOCOR * ZETIA * VYTORIN

## 2010-03-11 NOTE — Letter (Signed)
Summary: [facility]  [facility]   Imported By: Lanelle Bal 02/24/2010 10:40:37  _____________________________________________________________________  External Attachment:    Type:   Image     Comment:   External Document

## 2010-03-11 NOTE — Assessment & Plan Note (Signed)
Summary: ? sinus infection   Vital Signs:  Patient Profile:   77 Years Old Female Weight:      164 pounds Temp:     98.2 degrees F oral Pulse rate:   72 / minute Pulse rhythm:   regular BP sitting:   140 / 82  (left arm) Cuff size:   large  Vitals Entered By: Lowella Petties (March 29, 2007 4:22 PM)                 Chief Complaint:  Sinus pain and cough.  History of Present Illness: got sick yesterday  (had been a little tired monday- very busy and pushing herself lately) temp of over 100, and achey R side of head very painful to the jaw bad cough- with a little phlegm- grey to clear  sore thoat from cough R ear hurts a little and ear feels watery (and R eye is watering) clear nasal discharge   has used nasacort and chlorcedin  tylenol last nt for fever- whole body ached ? if exp to flu and did not get flu shot  (has been exp to a lot of kids)  no n/v/d       Current Allergies: ! AVELOX ! LIPITOR ! * TRIAMINIC * CRESTOR ACE INHIBITORS ZOCOR * ZETIA * VYTORIN     Review of Systems  General      Complains of malaise.  Eyes      Denies discharge and eye irritation.  Derm      Denies changes in color of skin and rash.  Psych      mood has been ok   Physical Exam  General:     well appearing but generally fatigued Head:     normocephalic, atraumatic, and no abnormalities observed.  R maxillary and frontal sinus tenderness Eyes:     vision grossly intact, pupils equal, pupils round, pupils reactive to light, and no injection.   Ears:     R ear normal and L ear normal.   Nose:     mucosal erythema and mucosal edema.   Mouth:     pharynx pink and moist, no erythema, and no exudates.  some clear post nasal driip Neck:     No deformities, masses, or tenderness noted. Lungs:     harsh bs at bases with a few scattered rhonchi end exp wheeze at bases with overall good air exch no rales Heart:     Normal rate and regular rhythm. S1 and  S2 normal without gallop, murmur, click, rub or other extra sounds. Skin:     Intact without suspicious lesions or rashes Cervical Nodes:     No lymphadenopathy noted Psych:     normal affect, talkative and pleasant     Impression & Recommendations:  Problem # 1:  SINUSITIS (ICD-473.9) with early bronchitis- very slt reactive airways (rapid flu test neg) remote hx of sinus surgeries on side corresponding will tx with biaxin, symptomatic care and monitor closely Her updated medication list for this problem includes:    Biaxin Xl 500 Mg Tb24 (Clarithromycin) .Marland Kitchen... 2 by mouth once daily with food for 10 days    Tussionex Pennkinetic Er 8-10 Mg/59ml Lqcr (Chlorpheniramine-hydrocodone) .Marland Kitchen... 1/2 to 1 tsp by mouth two times a day as needed severe cough  Orders: Flu A+B ([address])   Complete Medication List: 1)  Synthroid 125 Mcg Tabs (Levothyroxine sodium) .... Take 1 tablet by mouth once a day 2)  Hydrochlorothiazide 12.5 Mg Tabs (Hydrochlorothiazide) .Marland KitchenMarland KitchenMarland Kitchen  Take 1 tablet by mouth once a day 3)  Benicar Hct 40-25 Mg Tabs (Olmesartan medoxomil-hctz) .... Take 1 tablet by mouth once a day 4)  Diclofenac Sodium 75 Mg Tbec (Diclofenac sodium) .... As needed 5)  Omeprazole 20 Mg Tbec (Omeprazole) .... Take 1 tablet by mouth two times a day 6)  Lipitor 40 Mg Tabs (Atorvastatin calcium) .Marland Kitchen.. 1 by mouth q evening with a low fat snack 7)  Clobetasol Propionate 0.05 % Oint (Clobetasol propionate) .... Use as directed 8)  Ventolin Hfa 108 (90 Base) Mcg/act Aers (Albuterol sulfate) .... 2 puffs q 4 hours 9)  Nasacort  .... 2 sprays in each nostril qd 10)  Biaxin Xl 500 Mg Tb24 (Clarithromycin) .... 2 by mouth once daily with food for 10 days 11)  Tussionex Pennkinetic Er 8-10 Mg/31ml Lqcr (Chlorpheniramine-hydrocodone) .... 1/2 to 1 tsp by mouth two times a day as needed severe cough   Patient Instructions: 1)  take biaxin xl as directed 2)  start eating yogurt regularly to restore the natural flora  in GI system 3)  you can try mucinex over the counter twice daily as directed and nasal saline spray for congestion 4)  tylenol over the counter as directed may help with aches, headache and fever 5)  call if symptoms worsen or if not improved in 4-5 days 6)   if wheezing/ tight chest worsen- let me know so we can start some prednisone    Prescriptions: TUSSIONEX PENNKINETIC ER 8-10 MG/5ML  LQCR (CHLORPHENIRAMINE-HYDROCODONE) 1/2 to 1 tsp by mouth two times a day as needed severe cough  #8 oz x 0   Entered and Authorized by:   Judith Part MD   Signed by:   Judith Part MD on 03/29/2007   Method used:   Print then Give to Patient   RxID:   [id number] BIAXIN XL 500 MG  TB24 (CLARITHROMYCIN) 2 by mouth once daily with food for 10 days  #20 x 0   Entered and Authorized by:   Judith Part MD   Signed by:   Judith Part MD on 03/29/2007   Method used:   Print then Give to Patient   RxID:   [id number]  ] Prior Medications: SYNTHROID 125 MCG  TABS (LEVOTHYROXINE SODIUM) Take 1 tablet by mouth once a day HYDROCHLOROTHIAZIDE 12.5 MG  TABS (HYDROCHLOROTHIAZIDE) Take 1 tablet by mouth once a day BENICAR HCT 40-25 MG  TABS (OLMESARTAN MEDOXOMIL-HCTZ) Take 1 tablet by mouth once a day DICLOFENAC SODIUM 75 MG  TBEC (DICLOFENAC SODIUM) as needed OMEPRAZOLE 20 MG  TBEC (OMEPRAZOLE) Take 1 tablet by mouth two times a day LIPITOR 40 MG  TABS (ATORVASTATIN CALCIUM) 1 by mouth q evening with a low fat snack CLOBETASOL PROPIONATE 0.05 %  OINT (CLOBETASOL PROPIONATE) use as directed VENTOLIN HFA 108 (90 BASE) MCG/ACT  AERS (ALBUTEROL SULFATE) 2 puffs q 4 hours NASACORT () 2 sprays in each nostril qd Current Allergies: ! AVELOX ! LIPITOR ! * TRIAMINIC * CRESTOR ACE INHIBITORS ZOCOR * ZETIA * VYTORIN  Laboratory Results    Other Tests  Influenza: negative

## 2010-03-11 NOTE — Letter (Signed)
Summary: [facility]  [facility]   Imported By: Maryln Gottron 09/04/2009 11:07:55  _____________________________________________________________________  External Attachment:    Type:   Image     Comment:   External Document

## 2010-03-11 NOTE — Letter (Signed)
Summary: [facility]  [facility]   Imported By: Lanelle Bal 08/07/2008 09:54:49  _____________________________________________________________________  External Attachment:    Type:   Image     Comment:   External Document

## 2010-03-11 NOTE — Progress Notes (Signed)
Summary: synthroid refill  Phone Note Refill Request Call back at [phone] Message [facility]:  [id number] on August 13, 2008 11:08 AM  Refills Requested: Medication #1:  SYNTHROID 112 MCG TABS 1 each day for thyroid by mouth   Last Refilled: 07/07/2008 Was unsure how to proceed. You saw patient on 01/01/08. Patient had appt to see you on 07/24/08 but pt cancelled. in the TSH report  on 05/22/08 Dr. Milinda Antis had put to follow up with her in the summer. Please advise.   Method Requested: Electronic Initial call taken by: Lewanda Rife LPN,  August 14, 7251 11:10 AM  Follow-up for Phone Call        can refill for 112 synthroid for #30 and 6 refills--thanks   Billie-Lynn Tyler Deis FNP  August 13, 2008 11:39 AM   [facility] as instructed. Follow-up by: Lewanda Rife LPN,  August 14, 6642 12:36 PM

## 2010-03-11 NOTE — Progress Notes (Signed)
Summary: lab orders?  Phone Note Call from Patient   Caller: Patient Call For: Dawn Aguirre Summary of Call:  pt had labs today, she is c/o extreme fatigue not feeling right. She had labs for tsh but asks if more labs can be done for the fatigue and also any other labs she is due for. I drew extra blood for this purpose if you want to add labs  Initial call taken by: Liane Comber,  March 28, 2008 10:07 AM  Follow-up for Phone Call        please add Bmet and CBC--fatigue780.79  Dawn Tyler Deis FNP  March 28, 2008 4:33 PM

## 2010-03-11 NOTE — Letter (Signed)
Summary: [facility]  [facility]   Imported By: Lanelle Bal 01/13/2009 12:10:27  _____________________________________________________________________  External Attachment:    Type:   Image     Comment:   External Document

## 2010-05-19 ENCOUNTER — Encounter: Payer: Self-pay | Admitting: Family Medicine

## 2010-05-20 ENCOUNTER — Ambulatory Visit (INDEPENDENT_AMBULATORY_CARE_PROVIDER_SITE_OTHER): Payer: Medicare Other | Admitting: Family Medicine

## 2010-05-20 ENCOUNTER — Encounter: Payer: Self-pay | Admitting: Family Medicine

## 2010-05-20 VITALS — BP 156/90 | HR 80 | Temp 97.8°F | Ht 63.0 in | Wt 165.0 lb

## 2010-05-20 DIAGNOSIS — J209 Acute bronchitis, unspecified: Secondary | ICD-10-CM | POA: Insufficient documentation

## 2010-05-20 DIAGNOSIS — I10 Essential (primary) hypertension: Secondary | ICD-10-CM

## 2010-05-20 MED ORDER — AZITHROMYCIN 250 MG PO TABS: ORAL_TABLET | ORAL | Status: AC

## 2010-05-20 MED ORDER — GUAIFENESIN-CODEINE 100-10 MG/5ML PO SYRP: 5.0000 mL | ORAL_SOLUTION | Freq: Two times a day (BID) | ORAL | Status: DC | PRN

## 2010-05-20 NOTE — Assessment & Plan Note (Signed)
encouraged f/u with PCP.

## 2010-05-20 NOTE — Progress Notes (Signed)
  Subjective:    Patient ID: Dawn Aguirre, female    DOB: 1925/07/24, 77 y.o.   MRN: [id number]  HPI CC: congestion  1 wk h/o "allergies", RN and watery eyes then progressed to sinus pressure, congestion, now feels traveling down to chest.  + ST.  Grey phlegm coughing up, productive cough but notes difficulty with getting sputum out.  + chills.  + occasional HA with this.  Using nasocort which has helped some and some cough syrup (which knocked her out), thinks it was tussionex.  No fevers, abd pain, n/v/d.  No sick contacts at home, no smokers at home.  Off bp meds, states due for checkup with PCP.  Review of Systems Per HPI    Objective:   Physical Exam  Vitals reviewed. Constitutional: She appears well-developed and well-nourished. No distress.  HENT:  Head: Normocephalic and atraumatic.  Right Ear: External ear normal.  Left Ear: External ear normal.  Nose: Mucosal edema present. No rhinorrhea. Right sinus exhibits no maxillary sinus tenderness and no frontal sinus tenderness. Left sinus exhibits no maxillary sinus tenderness and no frontal sinus tenderness.  Mouth/Throat: Uvula is midline, oropharynx is clear and moist and mucous membranes are normal. No oropharyngeal exudate.       Some oropharyngeal erythema.  Some turbinate inflammation  Eyes: Conjunctivae and EOM are normal. Pupils are equal, round, and reactive to light. No scleral icterus.  Neck: Normal range of motion. Neck supple. No JVD present. No thyromegaly present.  Cardiovascular: Normal rate, regular rhythm and intact distal pulses.   Murmur (2/6 SEM) heard. Pulmonary/Chest: Effort normal and breath sounds normal. No respiratory distress. She has no wheezes. She has no rales.  Lymphadenopathy:    She has no cervical adenopathy.  Skin: Skin is warm and dry. No rash noted.          Assessment & Plan:

## 2010-05-20 NOTE — Assessment & Plan Note (Signed)
Going on 1 wk so far, supportive care as per instructions. zpack to hold on to in case not improving as expected or any worsening.

## 2010-05-20 NOTE — Patient Instructions (Signed)
Sounds like you have a viral upper respiratory infection. Viral infections usually take 7-10 days to resolve.  The cough can last 4 weeks to go away. Use medication as prescribed: zpack to hold on to in case not improving as expected. Push fluids and plenty of rest. Continue nasocort and nasal saline irrigation.  Start simple mucinex with plenty of water to mobilize mucous. Please return if you are not improving as expected, or if you have high fevers (>101.5) or difficulty swallowing or worsening productive cough. Call clinic with questions.  Pleasure to see you today.

## 2010-05-21 ENCOUNTER — Ambulatory Visit: Payer: Self-pay | Admitting: Family Medicine

## 2010-06-22 NOTE — Assessment & Plan Note (Signed)
[facility]                             PULMONARY OFFICE NOTE   NAME:Dawn Aguirre, Dawn Aguirre                     MRN:          [id number]  DATE:12/18/2006                            DOB:          04-03-1925    SUBJECTIVE:  Dawn Aguirre is a 77 year old woman who returns today to  discuss test results.  She has been exertional dyspnea, cough, and sinus  fullness.  She describes lightheadedness and dizziness with exertion,  also sometimes when going from a sitting to a standing position.  Her  biggest complaint today is right-sided head pressure, feels like  pressure behind her eye and the top of her head.  She also has some  facial and posterior head tingling.  It is not really a typical  headache.  She is concerned that this may be sinus disease.  She is  having some significant nasal stuffiness but not a lot of postnasal  drip.  She is not blowing any purulent mucus or blood out of her nose.  She says that she is afraid to exert herself or to move for fear of  falling due to the lightheadedness.  It occurs occasionally with  exertion.  Since our last visit, she has had an echocardiogram and a  chest x-ray.  She is here to review the results of these today.  She  continues to have a dry cough with exertion.  She has not had pulmonary  function testing yet.   CURRENT MEDICINES:  1. Synthroid 125 mcg daily.  2. HCTZ 12.5 mg daily.  3. Benicar 40/25 mg daily.   EXAM:  GENERAL:  This is a pleasant, elderly woman in no distress.  Her weight is 168 pounds, temperature 98.7, blood pressure 138/74, heart  rate 86; oxygen saturation is 96% on room air.  HEENT:  Her tympanic membranes are not visible.  She has cerumen  occluding her ear canals.  There is evidence of mild postnasal drip.  NECK:  Supple without lymphadenopathy or stridor.  LUNGS:  Clear and a normal respiratory cycle.  She does have some mild  end-expiratory wheeze on a forced expiration.  HEART:   Regular with a 2/6 systolic murmur.  ABDOMEN:  Benign.  EXTREMITIES:  Not examined.   Chest x-ray was performed on November 14, 2006.  This showed normal lung  fields, no evidence of infiltrate or effusion.  Her heart size was  normal.  Transthoracic echocardiogram was performed on November 23, 2006.  This study showed overall normal left ventricular function with an  estimated EF between 55-65%.  Her aortic valve is mildly thickened with  some mild aortic valvular stenosis and trivial aortic valvular  regurgitation.  The mean transaortic valve gradient was 10 mmHg.  Her  peak pulmonary arterial stock pressure was estimated to be 34 which is  slightly elevated.   IMPRESSION:  1. Mild dyspnea that has been associated with coughing and balance.  2. Allergic rhinitis that is untreated currently.  3. Possible sinus disease with head fullness and dizziness.   PLAN:  1. I will CT scan her sinuses to  rule out chronic sinusitis.  2. I have asked her to start Claritin 10 mg daily and Nasacort 2      sprays each nostril once daily.  3. We will defer her PFTs at this time but given her wheeze, I believe      that it may be necessary to perform these at some point in the      future if we do not adequately explain her imbalance and her      exertional symptoms.  4. Dawn Aguirre will return to see at my next available appointment to      review the results of her CT scan and plan further therapy and      testing.     Leslye Peer, MD  Electronically Signed    RSB/MedQ  DD: 12/18/2006  DT: 12/18/2006  Job #: [id number]

## 2010-06-22 NOTE — Assessment & Plan Note (Signed)
[facility]                             PULMONARY OFFICE NOTE   NAME:Aguirre, Dawn BOBO                     MRN:          [id number]  DATE:11/14/2006                            DOB:          12-29-1925    REASON FOR CONSULTATION:  This is a self-referral by Dawn Aguirre for  exertional dyspnea, fatigue and chronic cough.   BRIEF HOSPITAL COURSE:  Dawn Aguirre is a 77 year old woman with a history  of remote tobacco use who had been seen in our office by Dr. Billy Fischer for cough and fatigue. She had spirometry in 2001 that was  suggestive of possible air-flow limitation and was treated with Advair.  The medication was discontinued because she did not see much in the way  of improvement. Her therapies for her chronic cough included nasal  steroid, a non-sedating antihistamine and empiric GERD treatment. She  also was found to have a pansinusitis and was treated successfully for  this. Finally, she had been on an ACE inhibitor, and this was  discontinued. She has not been seen in the office since 2005 after  resolution of her cough. She returns today telling that she began to  have worsening cough about three weeks. It appears to be most bothersome  at night. It feels like she sometimes has a pain in her chest that goes  through to her back at night while she is supine. She complains of some  hoarse voice that has even been more bothersome since this weekend. She  feels the need to clear her throat consistently. She has had some poor  sleep, but she ascribes this more to arthritic pain in her shoulders and  back than she does to upper airway symptoms. She states that when she  exerts herself she feels shaky. She has decreased energy and feels a  bit more shortness of breath. She hears a noise when she breaths and  questions whether it might be wheezing. She denies any significant post-  nasal drip. She has had a chronic headache behind her right ear and  behind her right eye that are unchanged from previous but have been  persistently bothersome. Of note, it should be stated that Dawn Aguirre has  been recently diagnosed with apparent rheumatoid arthritis followed by  Dr. Corliss Skains. She has been on prednisone in the past for  musculoskeletal and joint complaints, but she is not on any medications  for rheumatoid arthritis at this time.   PAST MEDICAL HISTORY:  1. Hypertension.  2. Hypercholesterolemia.  3. Former tobacco use.  4. Previous spirometry that was thought to show possible reactive      airways disease. Repeat pulmonary function testing in 2004 does not      support air-flow limitation.  5. Sinus surgery in 2006.  6. Hysterectomy in 1968.  7. Possible rheumatoid arthritis. The patient has been followed by Dr.      Corliss Skains. She says the diagnosis was made sometime in the last 6      months.   ALLERGIES:  1. Avelox.  2. Lipitor.  3. Triaminic.  MEDICATIONS:  1. Fosamax 70 mg every 2 weeks.  2. Hydrocodone 5/500 mg daily.  3. Synthroid 125 mcg daily.  4. Vytorin 10/20 mg daily.  5. Hydrochlorothiazide 12.5 mg daily.  6. Benicar 40/25 mg daily.  7. Diclofenac 75 mg p.r.n.   SOCIAL HISTORY:  The patient is married. Lives with Dorena Cookey. She is  retired and did work as a Cabin crew. She is a former tobacco  user and smoked for 14 years. She had a 28 pack year history and quit 45  years ago. She does not use alcohol or other substances.   FAMILY HISTORY:  Is significant for coronary artery disease in multiple  family members.   REVIEW OF SYSTEMS:  Significant for dyspnea both at rest and with  activity. Nonproductive cough. Chest discomfort when she is supine as  mentioned in the HPI. She has also had some loss of appetite, joint  stiffness and pain.   PHYSICAL EXAMINATION:  In general, this is a pleasant, elderly, somewhat  overweight woman who interacts appropriately. She is slightly forgetful  and  is a somewhat poor history giver. Her weight is 168 pounds.  Temperature 98.1, blood pressure 144/86, heart rate 82, SpO2 99% on room  air.  HEENT:  Her oropharynx is narrow with the posterior pharynx and has some  mild erythema. There are no overt lesions.  Her lungs are clear to auscultation bilaterally.  Her heart has a 3/6 systolic murmur that is best heard at her left  sternal border and is heard throughout systole with an intact S2.  Her abdomen is soft, nontender with positive bowel sounds.  EXTREMITIES:  Have no clubbing, cyanosis, or edema.  NEUROLOGICAL:  She is forgetful as mentioned but has a grossly nonfocal  exam.   Pulmonary function testing performed in 2004 showed air flow with a FEV1  of 2.04 liters or 113% of predicted. She had a normal total lung  capacity with an isolated decrease in residual volume. Her diffusion  capacity was normal. Chest x-ray from Jun 24, 2006 showed no infiltrates  or effusions. Transthoracic echocardiogram was performed October 2006;  this study identified overall left ventricular systolic function with a  mildly thickened calcified aortic valve. Estimated pulmonary artery  pressure was 29 mmHg. An adenosine nuclear stress test was performed in  November of 2007; this showed a normal blood pressure response with no  significant ST segment changes indicative of ischemia and was read as a  normal stress nuclear study.   IMPRESSION:  1. Shortness of breath with exertion. The differential diagnosis is      broad, and I think we must consider possible air-flow limitation      although her PFTs in 2004 were reassuring. Also, we must consider      possible interstitial lung disease given her new diagnosis of      rheumatoid arthritis. Finally, given her murmur and her aortic      disease on prior echocardiogram, we must consider possible aortic      stenosis.  2. Cough with possible influences from GERD and allergic rhinitis.   PLAN:  1. I  will perform a chest x-ray to look for interstitial disease given      her possible diagnosis of rheumatoid arthritis.  2. I would like to confirm the diagnosis of rheumatic disease and will      try to obtain records regarding this workup.  3. I will repeat her pulmonary function testing to  compare with 2004.  4. We will perform a walking oximetry today to assure that she does      not have occult hypoxemia to explain her exertional dyspnea.  5. She needs a repeat transthoracic echocardiogram, and I will order      this test today to ensure that her aortic valve and her LV function      have not declined.  6. I will start an empiric protein-pump inhibitor with omeprazole 20      mg b.i.d. given her chronic cough and the presumed influence of      GERD.  7. She will follow up with me in one month to review the results of      her studies and to assess her progress.     Leslye Peer, MD  Electronically Signed    RSB/MedQ  DD: 11/14/2006  DT: 11/15/2006  Job #: [id number]   cc:   Kathryne Hitch, MD  Audrie Gallus Tower, MD  Rollene Rotunda, MD, [facility]

## 2010-06-25 NOTE — Assessment & Plan Note (Signed)
[facility]                              CARDIOLOGY OFFICE NOTE   NAME:Mehring, JALYSSA FLEISHER                     MRN:          [id number]  DATE:11/22/2005                            DOB:          [birth year]    PRIMARY CARE PHYSICIAN:  Marne A. Tower, MD.   REASON FOR VISIT:  Hypertension and chest pain.   HISTORY OF PRESENT ILLNESS:  The patient is a pleasant, 77 year old white  female who presents for followup of the above. We have been adjusting her  antihypertensive medications. At the last visit, we did talk about some  chest discomfort she has been having. She says that this has been increased  in frequency. It has been about 2 weeks. She describes a sharp pain in her  upper chest. It has not been radiating to her arms. She has had a little bit  of jaw discomfort as well. This has been sporadic and during the day with  activities. She is not noticing it at rest. She does not describe any  pressure. There is no associated nausea, vomiting or diaphoresis. It has  been moderate in intensity. She is not sure whether she has had this  particular pain before. It goes away spontaneously. She is trying to be  active around the house. She is quite a type A personality. She remains  stressed was lots of social and family obligations.   PAST MEDICAL HISTORY:  Hypothyroidism, osteoporosis, hypertension,  sinusitis, colitis, vertigo, hysterectomy, sinus surgery, rotator cuff  surgery.   ALLERGIES:  AVELOX, LIPITOR, TRIAMINIC (she has been intolerant to STATINS).   MEDICATIONS:  1. Fosamax.  2. Hydrocodone.  3. Synthroid 125 mcg daily.  4. Vytorin 10/20 daily.  5. Hydrochlorothiazide 12.5 mg daily.  6. Benicar 40/25 daily.   REVIEW OF SYSTEMS:  As stated in the HPI otherwise negative for other  systems.   PHYSICAL EXAMINATION:  GENERAL:  The patient is in no distress.  VITAL SIGNS:  Blood pressure 162/86, heart rate 72 and regular, weight 164  pounds, body mass index 29.  HEENT:  Eyes unremarkable. Pupils equal round and reactive to light. Fundi  not visualized. Oral mucosa unremarkable.  NECK:  No jugular venous distention. Wave form within normal limits. Carotid  upstroke brisk and symmetric, left carotid bruit versus transmitted systolic  murmur. No thyromegaly.  LYMPHATICS:  No cervical, axillary, or inguinal adenopathy.  LUNGS:  Clear to auscultation bilaterally.  BACK:  No costovertebral angle tenderness.  CHEST:  Unremarkable.  HEART:  PMI not displaced or sustained, S1 and S2 within normal limits. No  S3, no S4, 2/6 apical systolic murmur radiating slightly out the aortic  output tract, no diastolic murmurs.  ABDOMEN:  Mildly obese, positive bowel sounds, normal frequency and pitch.  No bruits, no rebound, guarding, no midline pulses. No mass, no  hepatomegaly, no splenomegaly.  SKIN:  No rashes, no nodules.  EXTREMITIES:  2+ pulses throughout. No edema, no cyanosis, no clubbing.  NEUROLOGIC:  Oriented to person, place and time. Cranial nerves II-XII  grossly intact. Motor grossly intact.   EKG, sinus  rhythm, rate 72, left axis deviation, __________  primary V1 and  V2, no acute ST-T wave changes.   ASSESSMENT/PLAN:  1. Chest:  The patient's chest discomfort has progressed. She does have      some cardiovascular risk factors. The pretest probability of      obstructive coronary disease is moderate. Therefore, she needs a stress      perfusion study. Further cardiovascular testing will be based on these      results.  2. Hypertension:  Blood pressure remains elevated. I am going to add      Norvasc 2.5 mg daily to her regimen.  3. Carotid bruit versus transmitted murmur. She will get a screening      carotid Doppler because of bruit.  4. Followup:  I will see her back in a couple of months after these      results.            ______________________________  Rollene Rotunda, MD, [facility]     JH/MedQ  DD:   11/22/2005  DT:  11/23/2005  Job #:  [id number]

## 2010-06-25 NOTE — H&P (Signed)
[facility]  Patient:    Dawn Aguirre, Dawn Aguirre                     MRN: [id number] Adm. Date:  [id number] Attending:  Harrel Carina CC:         Willis Modena. Dreiling, M.D.  Cheryle Horsfall, M.D.   History and Physical  CHIEF COMPLAINT:  Abdominal pain and rectal bleeding.  HISTORY OF PRESENT ILLNESS:  Patient is a 77 year old white female who presents with syndrome of low back pain beginning two and three days ago with nausea and malaise beginning two days ago with one episode of vomiting and a large nonbloody bowel movement with subsequent tenesmus, worsening lower abdominal cramps with several passages of small dark blood with clots per rectum over the last 36 hours.  She denies any hematemesis and has not had any fever the one time she checked her temperature.  She has had some slight chills.  She had an unexplained episode of bladder incontinence four days ago which has not recurred and she denies any urinary symptoms.  She initially had some low back pain but that has resolved.  She has no prior history of GI bleeding, has never had any colon imaging.  She denies any recent travel, recent antibiotic use, or any suspicious food ingestion.  PAST MEDICAL HISTORY: 1. Hypothyroidism. 2. Osteoporosis. 3. Hypertension.  PAST SURGICAL HISTORY:  Benign breast surgery, vaginal hysterectomy 1968.  MEDICATIONS:  Prinzide, Synthroid, Fosamax, Estrace.  ALLERGIES:  "TRIAMYCIN."  FAMILY HISTORY:  Father died of an MI.  Mother died in her 56s, cause unclear. No family history of GI or other malignancies.  SOCIAL HISTORY:  The patient lives with her husband.  She denies alcohol or tobacco use.  PHYSICAL EXAMINATION  GENERAL:  Well-developed, well-nourished white female in no acute distress looking somewhat washed out.  VITAL SIGNS:  Temperature 97.4, respirations 20, blood pressure 108/78, pulse 92.  HEENT:  Unremarkable.  No scleral icterus.  NECK:   Supple.  HEART:  Regular rate and rhythm without murmur.  BACK:  Straight.  LUNGS:  Clear.  No CVA tenderness.  ABDOMEN:  Soft, slightly distended with hypoactive bowel sounds.  No hepatosplenomegaly, mass, or surgical scars.  There is tenderness in the lower abdomen diffusely with mild rebound in the right and left lower quadrant.  RECTAL:  Small amount of maroon partly clotted blood on the examining finger with no masses.  LABORATORIES:  SMA 24 is completely normal.  WBC 13,700, hemoglobin 14.2, platelet count 255,000.  IMPRESSION:  Lower abdominal pain and rectal bleeding suggesting a colitis. Suggest ischemia versus infectious versus inflammatory bowel disease.  Cannot rule out neoplasm or diverticulitis.  PLAN:  Will admit.  Begin IV antibiotics empirically and keep on bowel rest for now.  Will reassess tomorrow.  Consider either flexible sigmoidoscopy or colonoscopy after purge within the next one to two days. DD:  06/06/00 TD:  06/06/00 Job: [id number]

## 2010-06-25 NOTE — Procedures (Signed)
[facility]  Patient:    Dawn Aguirre, Dawn Aguirre                     MRN: [id number] Proc. Date: 06/08/00 Adm. Date:  [id number] Attending:  Louie Bun CC:         Willis Modena. Dreiling, M.D.  Andres Ege, M.D.   Procedure Report  PROCEDURE:  Colonoscopy.  INDICATION FOR PROCEDURE:  Rectal bleeding and abdominal pain.  DESCRIPTION OF PROCEDURE:  The patient was placed in the left lateral decubitus position then placed on the pulse monitor with continuous low flow oxygen delivered by nasal cannula. She was sedated with 70 mg IV Demerol and 8 mg IV Versed. The Olympus video colonoscope was inserted into the rectum and advanced to the cecum, confirmed by transillumination at McBurneys point and visualization of the ileocecal valve and appendiceal orifice. The prep was good. The cecum, ascending, and transverse colon appeared normal with no no masses, polyps, diverticula or other mucosal abnormalities. Within the descending colon extending down to the rectosigmoid junction, there were areas of inflammation with intense erythema, submucosal hemorrhaging, overlying exudate and friability with overall appearance most endoscopically suggestive of ischemia although I could not rule out inflammatory bowel disease or an infectious etiology. Biopsies of the most severely infected areas were taken. The involvement was not completely confluent and there were patchy skip areas from approximately 50 cm down to 20 cm below which the rectal mucosa appeared normal. No diverticula were noted. The scope was then withdrawn and the patient returned to the recovery room in stable condition. The patient tolerated the procedure well and there were no immediate complications.  IMPRESSION:  Left sided colitis sparing the rectum likely due to ischemia.  PLAN:  Will continue antibiotics and await histology for suggested etiology. DD:  06/08/00 TD:  06/09/00 Job:  [id number]

## 2010-06-25 NOTE — Discharge Summary (Signed)
[facility]  Patient:    Dawn Aguirre, Dawn Aguirre                     MRN: [id number] Adm. Date:  [id number] Disc. Date: [id number] Attending:  Louie Bun CC:         Willis Modena. Dreiling, M.D.             Andres Ege, M.D.                           Discharge Summary  HISTORY OF PRESENT ILLNESS:  The patient is a _____-year-old white female who presented with lower abdominal cramps and rectal bleeding without any hematemesis or fever.  For details, please see admission history and physical.  COURSE IN HOSPITAL:  The patient was admitted with a presumed diagnosis of ischemic versus infectious colitis and was started on IV antibiotics.  She remained afebrile and did not exhibit any vomiting while she was in the hospital.  On the second hospital day, her white blood cell count had decreased from 13,700 to 7000, and she was given a gentle bowel prep for anticipated colonoscopy the next day.  On the third hospital day, she was feeling better, but the rectal bleeding had cleared after administration of Fleet Phospho-Soda.  Her colonoscopy revealed left-sided colitis with appearance most consistent with ischemia.  Biopsies were pending at the time of discharge.  She was switched to oral antibiotics and her diet was gradually advanced, and on May 3, she was felt ready for discharge.  Her hemoglobin on the last day of the hospitalization was stable at 11.3.  DISCHARGE MEDICATIONS: 1. Cipro 500 mg twice a day. 2. Darvocet as needed for pain. 3. Phenergan as needed for nausea.  DISCHARGE DIAGNOSES:  Ischemic colitis.  FOLLOW-UP:  Dr. Madilyn Fireman in 2-4 weeks. DD:  06/28/00 TD:  06/29/00 Job: [id number]

## 2010-06-25 NOTE — Op Note (Signed)
NAME:  Dawn Aguirre, Dawn Aguirre                        ACCOUNT NO.:  [id number]   MEDICAL RECORD NO.:  [id number]                   PATIENT TYPE:  AMB   LOCATION:  DSC                                  FACILITY:  [facility]   PHYSICIAN:  Hermelinda Medicus, M.D.                DATE OF BIRTH:  1925/02/13   DATE OF PROCEDURE:  11/05/2002  DATE OF DISCHARGE:                                 OPERATIVE REPORT   PREOPERATIVE DIAGNOSIS:  Pan sinusitis involving the sphenoid, ethmoid,  maxillary, and frontal sinuses.   POSTOPERATIVE DIAGNOSIS:  Pan sinusitis involving the sphenoid, ethmoid,  maxillary, and frontal sinuses.   PROCEDURE:  Functional endoscopic sinus surgery, bilateral frontal  ethmoidectomy, bilateral maxillary sinus ostial enlargements with a left  sphenoidotomy with removal of polyps.   SURGEON:  Hermelinda Medicus, M.D.   ANESTHESIA:  Local with 1% Xylocaine with epinephrine 6 mL, topical cocaine  200 mg, and MAC anesthesia standby with Janetta Hora. Gelene Mink, M.D.   DESCRIPTION OF PROCEDURE:  The patient was placed in the supine position  under local anesthesia and was prepped and draped in the usual sterile  fashion using Hibiclens and usual head drape.  The nose was carefully  examined and anesthetized using the above-mentioned anesthesia. The left  side was first approached where we could see polyps coming from the left  sphenoid sinus and we followed these polyps back to the natural ostium and  then opened the natural ostium and removed polyps from this left side. This  was, by far, the largest of the two sphenoid sinuses.  Once these polyps  were removed, we suctioned the sphenoid sinus, did not remove all of the  mucous membrane, we just made sure the natural ostium was opened wide so  that it would drain naturally.  Attention was then carried to the left  ethmoid sinus where the middle turbinate was pushed medial and the sinus was  entered using the 0 degree scope and the straight  Blakesley-Wilders and then  the upbiting Blakesley-Wilders were also used to remove the totally  engulfing portions of polyps. The polyps were taking up all of the space in  the ethmoid sinus except for a few superior cells.  These were removed and  the sinus was opened to drain and also was checked superiorly and to  evaluate and to open the frontal sinus, its natural ostium.  For this we  used a 70 degree scope and the upbiting Blakesley-Wilders and removed polyps  from this region also.  At this time, there was some mucous membrane that  was slightly thickened, but some membrane superiorly looked quite good and  we left this intact. The remainder of the polyps were removed inferiorly and  then we moved on to the maxillary sinus, where the ostium could not be seen.  Curved suction was used to find this by palpation and then the side-biting  forceps was  used to increase this to approximately five times its normal  size and then we were able to suction fluid and debris from this maxillary  sinus.  On the right side, we found again the ethmoid sinuses packed with  polyps and we removed a considerable amount of mucous membrane as well as  the polypoid debris inferiorly. Superiorly, we found fewer polyps and up by  the frontal sinus, we suctioned and it was opened.  We again were very  careful not to disturb the nasal frontal duct region.  The attention was  then carried to the maxillary sinus on the right side and the sinus opening  was again found using curved suction with 0 degree scope and the 70 degree  scope were used as we did in the ethmoid sinuses and this was increased to  approximately five times its normal size also.  At this point, we then  placed Gelfoam within the ethmoid sinus.  We placed Gelfilm keeping the  middle turbinates medial and then placed Murocel packs on both sides using  Neosporin ointment. The patient tolerated the procedure very well and is  doing well  postoperatively.    FOLLOW UP:  Overnight observation and then one week, three weeks, six weeks,  three months, six months, and a year.                                               Hermelinda Medicus, M.D.    JC/MEDQ  D:  11/05/2002  T:  11/05/2002  Job:  [id number]   cc:   Marne A. Milinda Antis, M.D. [facility]   Oley Balm. Sung Amabile, M.D. [facility]   Salvadore Farber, M.D.
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
## 2010-06-25 NOTE — Op Note (Signed)
NAME:  Dawn Aguirre, Dawn Aguirre              ACCOUNT NO.:  [id number]   MEDICAL RECORD NO.:  [id number]          PATIENT TYPE:  AMB   LOCATION:  DSC                          FACILITY:  [facility]   PHYSICIAN:  Robert A. Thurston Hole, M.D. DATE OF BIRTH:  03-13-1925   DATE OF PROCEDURE:  06/28/2005  DATE OF DISCHARGE:                                 OPERATIVE REPORT   PREOPERATIVE DIAGNOSES:  1.  Right shoulder rotator cuff tear.  2.  Right shoulder partial labrum tear and partial biceps tendon tear.  3.  Right shoulder impingement.   POSTOPERATIVE DIAGNOSES:  1.  Right shoulder rotator cuff tear.  2.  Right shoulder partial labrum tear and partial biceps tendon tear.  3.  Right shoulder impingement.   PROCEDURE:  1.  Right shoulder EUA followed by arthroscopically assisted rotator cuff      repair using Arthrex suture anchor x1.  2.  Right shoulder debridement partial labrum tear and partial biceps tendon      tear.  3.  Right shoulder subacromial decompression.   SURGEON:  Elana Alm. Thurston Hole, M.D.   ASSISTANT:  Julien Girt, P.A.   ANESTHESIA:  General.   OPERATIVE TIME:  45 minutes.   COMPLICATIONS:  None.   INDICATIONS FOR PROCEDURE:  Ms. Schwandt is a 80-year woman who has had  significant right shoulder pain for over a year with exam and MRI  documenting a rotator cuff tear with impingement.  She has failed  conservative care and is now to undergo arthroscopy and repair.   DESCRIPTION OF PROCEDURE:  Ms. Asquith is brought to the operating room on Jun 28, 2005, placed on the operating table in the supine position.  After being  placed under general anesthesia, her right shoulder was examined.  She had  full range of motion and her shoulder was a stable ligamentous exam.  She  received Ancef 1 gram IV preoperatively for prophylaxis.  Her shoulder and  arm was then prepped using sterile DuraPrep and draped using sterile  technique after being placed in a beach chair position.  Initially  the  arthroscopy was performed through a posterior arthroscopic portal, the  arthroscope with a pump attached was placed through an anterior portal and  arthroscopic probe was placed.  On initial inspection, the articular  cartilage and the glenohumeral joint was found to be intact.  She had  partial tearing of the anterior and superior middle labrum 25-30% which was  debrided.  The inferior labrum and anterior inferior glenohumeral ligament  complex was intact, posterior labrum partial tearing 25-30% and this was  found to be intact. The rotator cuff showed a complete tear of the  supraspinatus and a partial tear of the infraspinatus and this was partially  debrided arthroscopically.  The rest of the rotator cuff was intact. The  inferior capsular recess was free of pathology.  Subacromial space was  entered and a lateral arthroscopic portal was made. The rotator cuff tear  was well visualized and it was further debrided arthroscopically.  Impingement was noted and a subacromial decompression was carried out  removing 6-8  mm of the undersurface of the anterior, anterolateral, and  anteromedial acromion and CA ligament release carried out as well.  The [facility]  joint was not impinging on motion and thus was not resected. The rotator  cuff tear was well visualized. It was repaired primarily through a separate  anterolateral arthroscopic portal with a 5.5 mm suture anchor.  This anchor  had two sutures in it and each of the sutures were passed through the  rotator cuff tear and tied down arthroscopically thus repairing the rotator  cuff tear back down to the greater tuberosity.  After this was done, the  shoulder was brought to a full range of motion with no impingement on the  repair.  At this point, it was felt that all pathology had been  satisfactorily addressed.  The instruments were removed.  Portals closed  with 3-0 nylon suture, sterile dressings and a sling applied and the patient   awakened and taken to the recovery room in stable condition.   FOLLOW-UP CARE:  Ms. Libbey will be brought overnight to recovery care center  for IV pain control and neurovascular monitoring.  Discharged tomorrow on  Percocet for pain with early physical therapy.  Seen back in the office in a  week for sutures out and follow-up.      Robert A. Thurston Hole, M.D.  Electronically Signed     RAW/MEDQ  D:  06/28/2005  T:  06/28/2005  Job:  [id number]

## 2010-07-06 ENCOUNTER — Other Ambulatory Visit: Payer: Self-pay | Admitting: *Deleted

## 2010-07-06 MED ORDER — OLMESARTAN MEDOXOMIL-HCTZ 40-25 MG PO TABS: 1.0000 | ORAL_TABLET | Freq: Every day | ORAL | Status: DC

## 2010-07-13 ENCOUNTER — Encounter: Payer: Self-pay | Admitting: Family Medicine

## 2010-07-13 ENCOUNTER — Ambulatory Visit (INDEPENDENT_AMBULATORY_CARE_PROVIDER_SITE_OTHER): Payer: Medicare Other | Admitting: Family Medicine

## 2010-07-13 DIAGNOSIS — E785 Hyperlipidemia, unspecified: Secondary | ICD-10-CM

## 2010-07-13 DIAGNOSIS — E039 Hypothyroidism, unspecified: Secondary | ICD-10-CM

## 2010-07-13 DIAGNOSIS — I10 Essential (primary) hypertension: Secondary | ICD-10-CM

## 2010-07-13 LAB — CBC WITH DIFFERENTIAL/PLATELET
Basophils Absolute: 0 10*3/uL (ref 0.0–0.1)
Basophils Relative: 0.4 % (ref 0.0–3.0)
Eosinophils Absolute: 0.2 10*3/uL (ref 0.0–0.7)
Eosinophils Relative: 3.5 % (ref 0.0–5.0)
HCT: 39.9 % (ref 36.0–46.0)
Hemoglobin: 13.7 g/dL (ref 12.0–15.0)
Lymphocytes Relative: 33.9 % (ref 12.0–46.0)
Lymphs Abs: 2 10*3/uL (ref 0.7–4.0)
MCHC: 34.3 g/dL (ref 30.0–36.0)
MCV: 89.9 fl (ref 78.0–100.0)
Monocytes Absolute: 0.4 10*3/uL (ref 0.1–1.0)
Monocytes Relative: 7 % (ref 3.0–12.0)
Neutro Abs: 3.2 10*3/uL (ref 1.4–7.7)
Neutrophils Relative %: 55.2 % (ref 43.0–77.0)
Platelets: 208 10*3/uL (ref 150.0–400.0)
RBC: 4.44 Mil/uL (ref 3.87–5.11)
RDW: 13.4 % (ref 11.5–14.6)
WBC: 5.8 10*3/uL (ref 4.5–10.5)

## 2010-07-13 LAB — COMPREHENSIVE METABOLIC PANEL
ALT: 21 U/L (ref 0–35)
AST: 21 U/L (ref 0–37)
Albumin: 4.2 g/dL (ref 3.5–5.2)
Alkaline Phosphatase: 64 U/L (ref 39–117)
BUN: 25 mg/dL — ABNORMAL HIGH (ref 6–23)
CO2: 27 mEq/L (ref 19–32)
Calcium: 9.5 mg/dL (ref 8.4–10.5)
Chloride: 103 mEq/L (ref 96–112)
Creatinine, Ser: 1.1 mg/dL (ref 0.4–1.2)
GFR: 53.17 mL/min — ABNORMAL LOW (ref >60.00)
Glucose, Bld: 111 mg/dL — ABNORMAL HIGH (ref 70–99)
Potassium: 4.5 mEq/L (ref 3.5–5.1)
Sodium: 137 mEq/L (ref 135–145)
Total Bilirubin: 0.4 mg/dL (ref 0.3–1.2)
Total Protein: 7.1 g/dL (ref 6.0–8.3)

## 2010-07-13 LAB — LIPID PANEL
Cholesterol: 269 mg/dL — ABNORMAL HIGH (ref 0–200)
HDL: 66.3 mg/dL (ref >39.00)
Total CHOL/HDL Ratio: 4
Triglycerides: 137 mg/dL (ref 0.0–149.0)
VLDL: 27.4 mg/dL (ref 0.0–40.0)

## 2010-07-13 LAB — LDL CHOLESTEROL, DIRECT: Direct LDL: 211.2 mg/dL

## 2010-07-13 LAB — TSH: TSH: 8.38 u[IU]/mL — ABNORMAL HIGH (ref 0.35–5.50)

## 2010-07-13 MED ORDER — LEVOTHYROXINE SODIUM 112 MCG PO TABS: 112.0000 ug | ORAL_TABLET | Freq: Every day | ORAL | Status: DC

## 2010-07-13 MED ORDER — AMLODIPINE BESYLATE 5 MG PO TABS: 5.0000 mg | ORAL_TABLET | Freq: Every day | ORAL | Status: DC

## 2010-07-13 NOTE — Assessment & Plan Note (Signed)
Labs today with lipitor and diet  Disc low sat fat diet F/u 6 wk

## 2010-07-13 NOTE — Progress Notes (Signed)
Subjective:    Patient ID: Dawn Aguirre, female    DOB: 12/02/1925, 77 y.o.   MRN: [id number]  HPI Here for f/u of hypothyroid and lipids and HTN  Is doing ok in general - has been a rough year   HTN is fairly controlled Up a bit today 148/76-- her machine -- is running too high 150s/100s Has been more light headed than usual--thinks this may be stress related  No ha or edema or cp  Hypothyroid on supplement - due for labs  Is very tired No skin or hair changes No tremor No side eff to med   Lipids - due for check on lipitor and diet  She wants to do that today  Diet has been fair with a very busy schedule Lab Results  Component Value Date   CHOL 213* 10/12/2007   CHOL 251* 03/07/2007   CHOL 307* 03/10/2006   Lab Results  Component Value Date   HDL 44.2 10/12/2007   HDL 54.5 03/07/2007   HDL 56.8 03/10/2006   No results found for this basename: LDLCALC   Lab Results  Component Value Date   TRIG 130 10/12/2007   TRIG 114 03/07/2007   TRIG 86 03/10/2006   Lab Results  Component Value Date   CHOLHDL 4.8 CALC 10/12/2007   CHOLHDL 4.6 CALC 03/07/2007   CHOLHDL 5.4 CALC 03/10/2006   Lab Results  Component Value Date   LDLDIRECT 128.6 10/12/2007   LDLDIRECT 175.4 03/07/2007   LDLDIRECT 232.5 03/10/2006     Wt is down 2 lb with bmi of 28  Sees Dr Jon Billings for arthritis - uses hydrocodone for pain very sparingly   Had a pessary -- is out now - bladder prolapse   Lot of abd bloating  Needs to see her cardiol for check up also  Patient Active Problem List  Diagnoses  . HYPOTHYROIDISM  . HYPERLIPIDEMIA  . STRESS REACTION, ACUTE, WITH EMOTIONAL DISTURBANCE  . HYPERTENSION  . RAYNAUD'S SYNDROME  . EXTERNAL HEMORRHOIDS  . OTHER ACUTE SINUSITIS  . COPD  . GERD  . GASTRITIS  . DIVERTICULOSIS, COLON  . FIBROCYSTIC BREAST DISEASE  . RHEUMATOID ARTHRITIS  . DISC DISEASE, CERVICAL  . OSTEOPOROSIS  . DYSPNEA ON EXERTION  . COUGH, CHRONIC  . ABDOMINAL BLOATING  . TOBACCO  ABUSE, HX OF  . HYSTERECTOMY, HX OF   Past Medical History  Diagnosis Date  . Flatulence, eructation, and gas pain   . Chronic airway obstruction, not elsewhere classified   . Cough   . Degeneration of cervical intervertebral disc   . Diverticulosis of colon (without mention of hemorrhage)   . Other dyspnea and respiratory abnormality   . External hemorrhoids without mention of complication   . Diffuse cystic mastopathy   . Unspecified gastritis and gastroduodenitis without mention of hemorrhage   . Esophageal reflux   . Other and unspecified hyperlipidemia   . Unspecified essential hypertension   . Unspecified hypothyroidism   . Acquired absence of organ, genital organs   . Osteoporosis, unspecified   . Other acute sinusitis   . Raynaud's syndrome   . Rheumatoid arthritis     Sero-negative  . Predominant disturbance of emotions   . Personal history of tobacco use, presenting hazards to health   . Lichen sclerosus     back/abd/vulvar  . Spinal stenosis   . Colitis, ischemic 07/2000    ? infect  . Mastoiditis of right side 04/2005    admitted  . Fibula  fracture 2006   Past Surgical History  Procedure Date  . Abdominal hysterectomy 1968  . Dexa 2001    OP,heel  . Cardiollite 02/2002    Neg  . Nasal sinus surgery 05/2002  . Dexa     OP hip T-2.94  . US echocardiography 10/06    normal LVF EF 55-65%  . Nuclear stress test 11/07    Normal   History  Substance Use Topics  . Smoking status: Former Smoker    Quit date: 02/07/1962  . Smokeless tobacco: Not on file  . Alcohol Use: No   Family History  Problem Relation Age of Onset  . Heart attack Father 40  . Coronary artery disease Sister     CABG  . Heart attack Brother   . Coronary artery disease      GF   Allergies  Allergen Reactions  . Ace Inhibitors     REACTION: cough  . Ezetimibe     REACTION: myalgia  . Ezetimibe-Simvastatin     REACTION: joints swell  . Moxifloxacin   . Rosuvastatin      REACTION: myalgia  . Simvastatin     REACTION: myalgia  . Triaminic    Current Outpatient Prescriptions on File Prior to Visit  Medication Sig Dispense Refill  . atorvastatin (LIPITOR) 40 MG tablet Take 40 mg by mouth daily. With low fat snack       . cholecalciferol (VITAMIN D) 1000 UNITS tablet Take 1,000 Units by mouth daily.        Marland Kitchen HYDROcodone-acetaminophen (VICODIN) 5-500 MG per tablet Take 1 tablet by mouth 2 (two) times daily as needed.        Marland Kitchen olmesartan-hydrochlorothiazide (BENICAR HCT) 40-25 MG per tablet Take 1 tablet by mouth daily.  30 tablet  0  . vitamin B-12 (CYANOCOBALAMIN) 500 MCG tablet Take 500 mcg by mouth daily.        . fish oil-omega-3 fatty acids 1000 MG capsule Take 1 g by mouth daily.        . Fluticasone-Salmeterol (ADVAIR DISKUS) 250-50 MCG/DOSE AEPB Inhale 1 puff into the lungs every 12 (twelve) hours.        Marland Kitchen guaiFENesin-codeine (ROBITUSSIN AC) 100-10 MG/5ML syrup Take 5 mLs by mouth 2 (two) times daily as needed for cough. Sedation precautions  120 mL  0  . hydrochlorothiazide (,MICROZIDE/HYDRODIURIL,) 12.5 MG capsule Take 12.5 mg by mouth daily.        Marland Kitchen omeprazole (PRILOSEC) 20 MG capsule Take 20 mg by mouth daily.        . psyllium (METAMUCIL SMOOTH TEXTURE) 58.6 % powder Take 1 packet by mouth daily.                  Review of Systems Review of Systems  Constitutional: Negative for fever, appetite change,  and unexpected weight change. pos for bloating  Eyes: Negative for pain and visual disturbance.  Respiratory: Negative for cough and shortness of breath.   Cardiovascular: Negative.for palpitations or edema / pos for occ cp-seeing cardiol for this   Gastrointestinal: Negative for nausea, diarrhea and constipation. pos for peristant bloating Genitourinary: Negative for urgency and frequency.  Skin: Negative for pallor.  Neurological: Negative for weakness, light-headedness, numbness and headaches.  Hematological: Negative for adenopathy.  Does not bruise/bleed easily.  Psychiatric/Behavioral: Negative for dysphoric mood. The patient is not nervous/anxious.          Objective:   Physical Exam  Cardiovascular: Normal rate, regular rhythm and intact  distal pulses.   Murmur heard.         Assessment & Plan:

## 2010-07-13 NOTE — Assessment & Plan Note (Signed)
bp too high  Will add amlodipine- and update if side eff or problems F/u 6 wk Labs today Also pt due for cardiac f/u-- will see cardiol before next visit

## 2010-07-13 NOTE — Assessment & Plan Note (Signed)
Labs today More fatigue- this may be due to schedule  Nl exam  refil synthroid

## 2010-07-13 NOTE — Patient Instructions (Addendum)
Add amlodipine 5 mg in am for blood pressure- if any side effects or problems let me know  Labs today  Follow up with your cardiologist - this is very important to keep up with  Follow up with me in about 6 weeks

## 2010-07-21 ENCOUNTER — Telehealth: Payer: Self-pay

## 2010-07-21 MED ORDER — LEVOTHYROXINE SODIUM 125 MCG PO TABS: 125.0000 ug | ORAL_TABLET | Freq: Every day | ORAL | Status: DC

## 2010-07-21 NOTE — Telephone Encounter (Signed)
Message copied by Patience Musca on Wed Jul 21, 2010 12:58 PM ------      Message from: Roxy Manns A      Created: Thu Jul 15, 2010 12:51 PM       Cholesterol is quite high       tsh high - unless she has missed doses need to increase her dose-please let me know       Perhaps this is adding to fatigue       Will disc labs in more detail at f/u

## 2010-07-21 NOTE — Telephone Encounter (Signed)
Patient notified as instructed by telephone. Pt said she has not missed any doses of Synthroid . She takes 1 daily. Pt uses [facility] [address] at [employer]. Pt will wait to hear if any dosage changes.

## 2010-07-21 NOTE — Telephone Encounter (Signed)
Ok - will inc dose slightly .Px written for call in   Re check tsh for 244.9 in 6 weeks please

## 2010-07-21 NOTE — Telephone Encounter (Signed)
Patient notified as instructed by telephone. Lab appt scheduled as instructed 08/30/10 at 11am. Medication phoned to [facility] pharmacy as instructed.

## 2010-08-04 ENCOUNTER — Other Ambulatory Visit: Payer: Medicare Other

## 2010-08-04 ENCOUNTER — Other Ambulatory Visit: Payer: Self-pay | Admitting: Family Medicine

## 2010-08-04 DIAGNOSIS — Z1211 Encounter for screening for malignant neoplasm of colon: Secondary | ICD-10-CM

## 2010-08-04 LAB — FECAL OCCULT BLOOD, IMMUNOCHEMICAL: Fecal Occult Bld: NEGATIVE

## 2010-08-04 LAB — FECAL OCCULT BLOOD, GUAIAC: Fecal Occult Blood: NEGATIVE

## 2010-08-12 ENCOUNTER — Other Ambulatory Visit: Payer: Self-pay | Admitting: Family Medicine

## 2010-08-27 ENCOUNTER — Encounter: Payer: Self-pay | Admitting: Family Medicine

## 2010-08-27 ENCOUNTER — Ambulatory Visit (INDEPENDENT_AMBULATORY_CARE_PROVIDER_SITE_OTHER): Payer: Medicare Other | Admitting: Family Medicine

## 2010-08-27 DIAGNOSIS — E785 Hyperlipidemia, unspecified: Secondary | ICD-10-CM

## 2010-08-27 DIAGNOSIS — I10 Essential (primary) hypertension: Secondary | ICD-10-CM

## 2010-08-27 DIAGNOSIS — E039 Hypothyroidism, unspecified: Secondary | ICD-10-CM

## 2010-08-27 NOTE — Assessment & Plan Note (Signed)
Will check tsh on ,monday on higher dose ? If will help cholesterol [employer] not much better but very crazy schedule

## 2010-08-27 NOTE — Patient Instructions (Signed)
Labs on Monday as planned  Keep working on lifestyle change  When you see the cardiologist - talk about your cholesterol- it is quite high (hope it improves with better thyroid titration)  Talk about alternative treatments like zetia or welchol - ? Curious to see what he thinks  Will advise you further when labs return  bp is better on 2nd check today No change in medicines

## 2010-08-27 NOTE — Progress Notes (Signed)
Subjective:    Patient ID: Dawn Aguirre, female    DOB: [birth year], 77 y.o.   MRN: [id number]  HPI Here for f/u of HTN and lipids and hypothyroid   Is making some changes in her lifestyle Too many commitments this summer - really crazy  She pushes through a lot of pain    tsh was up last check and inc her dose  Still pretty tired and gives out easily -- but very crazy schedule    Lipids high with LDL 211 Non tol statins Is extremely careful with her diet  No fats at all   bp 160/80 Started amlodipine last visit Is taking her amlodipine  Is not checking her bp at home  Long stressful day  Gets to relax this weekend   Patient Active Problem List  Diagnoses  . HYPOTHYROIDISM  . HYPERLIPIDEMIA  . STRESS REACTION, ACUTE, WITH EMOTIONAL DISTURBANCE  . HYPERTENSION  . RAYNAUD'S SYNDROME  . EXTERNAL HEMORRHOIDS  . OTHER ACUTE SINUSITIS  . COPD  . GERD  . GASTRITIS  . DIVERTICULOSIS, COLON  . FIBROCYSTIC BREAST DISEASE  . RHEUMATOID ARTHRITIS  . DISC DISEASE, CERVICAL  . OSTEOPOROSIS  . DYSPNEA ON EXERTION  . COUGH, CHRONIC  . ABDOMINAL BLOATING  . TOBACCO ABUSE, HX OF  . HYSTERECTOMY, HX OF   Past Medical History  Diagnosis Date  . Flatulence, eructation, and gas pain   . Chronic airway obstruction, not elsewhere classified   . Cough   . Degeneration of cervical intervertebral disc   . Diverticulosis of colon (without mention of hemorrhage)   . Other dyspnea and respiratory abnormality   . External hemorrhoids without mention of complication   . Diffuse cystic mastopathy   . Unspecified gastritis and gastroduodenitis without mention of hemorrhage   . Esophageal reflux   . Other and unspecified hyperlipidemia   . Unspecified essential hypertension   . Unspecified hypothyroidism   . Acquired absence of organ, genital organs   . Osteoporosis, unspecified   . Other acute sinusitis   . Raynaud's syndrome   . Rheumatoid arthritis     Sero-negative  .  Predominant disturbance of emotions   . Personal history of tobacco use, presenting hazards to health   . Lichen sclerosus     back/abd/vulvar  . Spinal stenosis   . Colitis, ischemic 07/2000    ? infect  . Mastoiditis of right side 04/2005    admitted  . Fibula fracture 2006   Past Surgical History  Procedure Date  . Abdominal hysterectomy 1968  . Dexa 2001    OP,heel  . Cardiollite 02/2002    Neg  . Nasal sinus surgery 05/2002  . Dexa     OP hip T-2.94  . US echocardiography 10/06    normal LVF EF 55-65%  . Nuclear stress test 11/07    Normal   History  Substance Use Topics  . Smoking status: Former Smoker    Quit date: 02/07/1962  . Smokeless tobacco: Not on file  . Alcohol Use: No   Family History  Problem Relation Age of Onset  . Heart attack Father 21  . Coronary artery disease Sister     CABG  . Heart attack Brother   . Coronary artery disease      GF   Allergies  Allergen Reactions  . Ace Inhibitors     REACTION: cough  . Ezetimibe     REACTION: myalgia  . Ezetimibe-Simvastatin     REACTION: joints  swell  . Moxifloxacin   . Rosuvastatin     REACTION: myalgia  . Simvastatin     REACTION: myalgia  . Triaminic    Current Outpatient Prescriptions on File Prior to Visit  Medication Sig Dispense Refill  . amLODipine (NORVASC) 5 MG tablet Take 1 tablet (5 mg total) by mouth daily.  30 tablet  11  . atorvastatin (LIPITOR) 40 MG tablet Take 40 mg by mouth daily. With low fat snack       . BENICAR HCT 40-25 MG per tablet TAKE 1 TABLET BY MOUTH DAILY.  30 tablet  0  . cholecalciferol (VITAMIN D) 1000 UNITS tablet Take 1,000 Units by mouth daily.        Marland Kitchen levothyroxine (SYNTHROID) 125 MCG tablet Take 1 tablet (125 mcg total) by mouth daily.  30 tablet  11  . Probiotic Product (ALIGN PO) Take 1 capsule by mouth every other day.        . vitamin B-12 (CYANOCOBALAMIN) 500 MCG tablet Take 500 mcg by mouth daily.       . fish oil-omega-3 fatty acids 1000 MG  capsule Take 1 g by mouth daily.        . Fluticasone-Salmeterol (ADVAIR DISKUS) 250-50 MCG/DOSE AEPB Inhale 1 puff into the lungs every 12 (twelve) hours.        Marland Kitchen guaiFENesin-codeine (ROBITUSSIN AC) 100-10 MG/5ML syrup Take 5 mLs by mouth 2 (two) times daily as needed for cough. Sedation precautions  120 mL  0  . hydrochlorothiazide (,MICROZIDE/HYDRODIURIL,) 12.5 MG capsule Take 12.5 mg by mouth daily.        Marland Kitchen HYDROcodone-acetaminophen (VICODIN) 5-500 MG per tablet Take 1 tablet by mouth 2 (two) times daily as needed.        Marland Kitchen omeprazole (PRILOSEC) 20 MG capsule Take 20 mg by mouth daily.        . psyllium (METAMUCIL SMOOTH TEXTURE) 58.6 % powder Take 1 packet by mouth daily.             Review of Systems Review of Systems  Constitutional: Negative for fever, appetite change,  and unexpected weight change. pos for fatigue  Eyes: Negative for pain and visual disturbance.  Respiratory: Negative for cough and shortness of breath.   Cardiovascular: Negative.  For cp or sob or palp Gastrointestinal: Negative for nausea, diarrhea and constipation.  Genitourinary: Negative for urgency and frequency.  Skin: Negative for pallor. or rash MSK pos for back pain chronic  Neurological: Negative for weakness, light-headedness, numbness and headaches.  Hematological: Negative for adenopathy. Does not bruise/bleed easily.  Psychiatric/Behavioral: Negative for dysphoric mood. The patient is not nervous/anxious.          Objective:   Physical Exam  Constitutional: She appears well-developed and well-nourished. No distress.  HENT:  Head: Normocephalic and atraumatic.  Mouth/Throat: Oropharynx is clear and moist.  Eyes: Conjunctivae and EOM are normal. Pupils are equal, round, and reactive to light.  Neck: Normal range of motion. Neck supple. No JVD present. Carotid bruit is not present. No thyromegaly present.  Cardiovascular: Normal rate and regular rhythm.   Murmur heard. Pulmonary/Chest:  Effort normal and breath sounds normal. No respiratory distress. She has no wheezes.  Abdominal: Soft. Bowel sounds are normal. She exhibits no distension, no abdominal bruit and no mass. There is no tenderness.  Musculoskeletal: Normal range of motion. She exhibits no edema and no tenderness.  Lymphadenopathy:    She has no cervical adenopathy.  Neurological: She is alert. She  has normal reflexes. No cranial nerve deficit.  Skin: Skin is warm and dry. No rash noted. No erythema. No pallor.  Psychiatric: She has a normal mood and affect.          Assessment & Plan:

## 2010-08-27 NOTE — Assessment & Plan Note (Signed)
Very high chol Intol of crestor/ lipitor/ zocor/ vytorin  ? Consider plain zetia or welchol Asked her to get her cardiologist's input on that

## 2010-08-29 NOTE — Assessment & Plan Note (Signed)
bp much better on 2nd check after pt was sitting  Admittedly was in a hurry to get here and rushed/ sressed  Will continue the amlodipine that she tolerates well Will continue cardiol f/u and will have another bp check soon

## 2010-08-30 ENCOUNTER — Other Ambulatory Visit (INDEPENDENT_AMBULATORY_CARE_PROVIDER_SITE_OTHER): Payer: Medicare Other | Admitting: Family Medicine

## 2010-08-30 ENCOUNTER — Telehealth: Payer: Self-pay | Admitting: Family Medicine

## 2010-08-30 DIAGNOSIS — E039 Hypothyroidism, unspecified: Secondary | ICD-10-CM

## 2010-08-30 LAB — TSH: TSH: 0.23 u[IU]/mL — ABNORMAL LOW (ref 0.35–5.50)

## 2010-08-30 MED ORDER — LEVOTHYROXINE SODIUM 112 MCG PO TABS: 112.0000 ug | ORAL_TABLET | Freq: Every day | ORAL | Status: DC

## 2010-08-30 NOTE — Telephone Encounter (Signed)
Levothyroxine is too much - pt's dose needs to be decreased again because tsh is low  Px written for call in   Re check tsh and free T4 for hypothyroidism in 6 weeks please

## 2010-08-31 ENCOUNTER — Ambulatory Visit (INDEPENDENT_AMBULATORY_CARE_PROVIDER_SITE_OTHER): Payer: Medicare Other | Admitting: Cardiology

## 2010-08-31 ENCOUNTER — Encounter: Payer: Self-pay | Admitting: Cardiology

## 2010-08-31 DIAGNOSIS — R0989 Other specified symptoms and signs involving the circulatory and respiratory systems: Secondary | ICD-10-CM

## 2010-08-31 DIAGNOSIS — R0609 Other forms of dyspnea: Secondary | ICD-10-CM

## 2010-08-31 DIAGNOSIS — I359 Nonrheumatic aortic valve disorder, unspecified: Secondary | ICD-10-CM

## 2010-08-31 DIAGNOSIS — I10 Essential (primary) hypertension: Secondary | ICD-10-CM

## 2010-08-31 DIAGNOSIS — I350 Nonrheumatic aortic (valve) stenosis: Secondary | ICD-10-CM | POA: Insufficient documentation

## 2010-08-31 DIAGNOSIS — E785 Hyperlipidemia, unspecified: Secondary | ICD-10-CM

## 2010-08-31 DIAGNOSIS — R0602 Shortness of breath: Secondary | ICD-10-CM

## 2010-08-31 NOTE — Patient Instructions (Signed)
Your physician has requested that you have an echocardiogram. Echocardiography is a painless test that uses sound waves to create images of your heart. It provides your doctor with information about the size and shape of your heart and how well your heart's chambers and valves are working. This procedure takes approximately one hour. There are no restrictions for this procedure.  Please have blood work today (BNP)  The current medical regimen is effective;  continue present plan and medications.  Follow up as directed

## 2010-08-31 NOTE — Assessment & Plan Note (Signed)
This will be evaluated as above. 

## 2010-08-31 NOTE — Assessment & Plan Note (Signed)
The patient's dyspnea may certainly have a cardiac etiology. In going to start with a BNP level and an echocardiogram. If these do not demonstrate a clear etiology I will proceed with stress perfusion imaging. She would not be able to walk on treadmill with some joint pain in her dyspnea.  She will have a [employer].

## 2010-08-31 NOTE — Telephone Encounter (Signed)
Patient notified as instructed by telephone. Lab appt scheduled as instructed 10/15/10 at 10am. Medication phoned to [facility] as instructed and spoke with Misty Stanley.Misty Stanley will cancel rx also.

## 2010-08-31 NOTE — Assessment & Plan Note (Signed)
The blood pressure is at target. No change in medications is indicated. We will continue with therapeutic lifestyle changes (TLC).  

## 2010-08-31 NOTE — Progress Notes (Signed)
HPI The patient presents for evaluation of dyspnea. I saw her several years ago. She had a negative stress test. Echocardiogram did suggest mild aortic sclerosis.  She reports that she has been getting progressively short of breath. She has noticed this with activity such as walking up thousand feet to the front of her yard. He's noticed this in airports. It is starting to limit her in her activities of daily living. She might get some chest discomfort with this but this is vague. She's not having any resting complaints and she denies any PND or orthopnea. She does not get jaw or arm discomfort. She is not describing palpitations, presyncope or syncope. She's had no weight gain or edema. Since I last saw her she has had no further cardiovascular testing. She has had some increased stress in her life. She and her 87 year old husband care for a 74 year old son with Down syndrome. 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 Ace Inhibitors     REACTION: cough  . Ezetimibe     REACTION: myalgia  . Ezetimibe-Simvastatin     REACTION: joints swell  . Moxifloxacin   . Rosuvastatin     REACTION: myalgia  . Simvastatin     REACTION: myalgia  . Triaminic     Current Outpatient Prescriptions  Medication Sig Dispense Refill  . amLODipine (NORVASC) 5 MG tablet Take 1 tablet (5 mg total) by mouth daily.  30 tablet  11  . atorvastatin (LIPITOR) 40 MG tablet Take 40 mg by mouth daily. With low fat snack       . BENICAR HCT 40-25 MG per tablet TAKE 1 TABLET BY MOUTH DAILY.  30 tablet  0  . cholecalciferol (VITAMIN D) 1000 UNITS tablet Take 1,000 Units by mouth daily.        Marland Kitchen guaiFENesin-codeine (ROBITUSSIN AC) 100-10 MG/5ML syrup Take 5 mLs by mouth 2 (two) times daily as needed for cough. Sedation precautions  120 mL  0  . HYDROcodone-acetaminophen (VICODIN) 5-500 MG per tablet Take 1  tablet by mouth 2 (two) times daily as needed.        Marland Kitchen levothyroxine (SYNTHROID, LEVOTHROID) 112 MCG tablet Take 1 tablet (112 mcg total) by mouth daily.  30 tablet  11  . Probiotic Product (ALIGN PO) Take 1 capsule by mouth every other day.        . vitamin B-12 (CYANOCOBALAMIN) 500 MCG tablet Take 500 mcg by mouth daily.         Past Medical History  Diagnosis Date  . Flatulence, eructation, and gas pain   . Chronic airway obstruction, not elsewhere classified   . Cough   . Degeneration of cervical intervertebral disc   . Diverticulosis of colon (without mention of hemorrhage)   . Other dyspnea and respiratory abnormality   . External hemorrhoids without mention of complication   . Diffuse cystic mastopathy   . Unspecified gastritis and gastroduodenitis without mention of hemorrhage   . Esophageal reflux   . Other and unspecified hyperlipidemia   . Unspecified essential hypertension   . Unspecified hypothyroidism   . Acquired absence of organ, genital organs   . Osteoporosis, unspecified   . Other acute sinusitis   . Raynaud's syndrome   . Rheumatoid arthritis     Sero-negative  . Predominant disturbance of emotions   . Personal history of tobacco use, presenting hazards to  health   . Lichen sclerosus     back/abd/vulvar  . Spinal stenosis   . Colitis, ischemic 07/2000    ? infect  . Mastoiditis of right side 04/2005    admitted  . Fibula fracture 2006    Past Surgical History  Procedure Date  . Abdominal hysterectomy 1968  . Dexa 2001    OP,heel  . Cardiollite 02/2002    Neg  . Nasal sinus surgery 05/2002  . Dexa     OP hip T-2.94  . US echocardiography 10/06    normal LVF EF 55-65%  . Nuclear stress test 11/07    Normal    Family History  Problem Relation Age of Onset  . Heart attack Father 53  . Coronary artery disease Sister     CABG  . Heart attack Brother   . Coronary artery disease      GF    History   Social History  . Marital Status: Married     Spouse Name: N/A    Number of Children: 3  . Years of Education: N/A   Occupational History  . Retired    Social History Main Topics  . Smoking status: Former Smoker    Quit date: 02/07/1962  . Smokeless tobacco: Not on file  . Alcohol Use: No  . Drug Use: No  . Sexually Active: Not on file   Other Topics Concern  . Not on file   Social History Narrative   RetiredLots of volunteering-[employer], cares for disabled son    ROS:  As stated in the HPI and negative for all other systems.  PHYSICAL EXAM BP 142/68  Pulse 83  Resp 16  Ht 5\' 3"  (1.6 m)  Wt 161 lb (73.029 kg)  BMI 28.52 kg/m2 GENERAL:  Well appearing HEENT:  Pupils equal round and reactive, fundi not visualized, oral mucosa unremarkable NECK:  No jugular venous distention, waveform within normal limits, carotid upstroke brisk and symmetric, no bruits, no thyromegaly LYMPHATICS:  No cervical, inguinal adenopathy LUNGS:  Clear to auscultation bilaterally BACK:  No CVA tenderness CHEST:  Unremarkable HEART:  PMI not displaced or sustained,S1 and S2 within normal limits, no S3, no S4, no clicks, no rubs, systolic murmur early peaking radiating the aortic outflow tract ABD:  Flat, positive bowel sounds normal in frequency in pitch, no bruits, no rebound, no guarding, no midline pulsatile mass, no hepatomegaly, no splenomegaly EXT:  2 plus pulses throughout, no edema, no cyanosis no clubbing SKIN:  No rashes no nodules NEURO:  Cranial nerves II through XII grossly intact, motor grossly intact throughout PSYCH:  Cognitively intact, oriented to person place and time   EKG:  Normal sinus rhythm, left axis deviation, poor anterior R-wave progression, no acute ST-T wave changes.  ASSESSMENT AND PLAN

## 2010-09-01 LAB — BRAIN NATRIURETIC PEPTIDE: Pro B Natriuretic peptide (BNP): 34 pg/mL (ref 0.0–100.0)

## 2010-09-07 ENCOUNTER — Ambulatory Visit (HOSPITAL_COMMUNITY): Payer: Medicare Other | Attending: Cardiology

## 2010-09-07 DIAGNOSIS — I350 Nonrheumatic aortic (valve) stenosis: Secondary | ICD-10-CM

## 2010-09-07 DIAGNOSIS — I10 Essential (primary) hypertension: Secondary | ICD-10-CM | POA: Insufficient documentation

## 2010-09-07 DIAGNOSIS — R0989 Other specified symptoms and signs involving the circulatory and respiratory systems: Secondary | ICD-10-CM | POA: Insufficient documentation

## 2010-09-07 DIAGNOSIS — I359 Nonrheumatic aortic valve disorder, unspecified: Secondary | ICD-10-CM | POA: Insufficient documentation

## 2010-09-07 DIAGNOSIS — I7300 Raynaud's syndrome without gangrene: Secondary | ICD-10-CM | POA: Insufficient documentation

## 2010-09-07 DIAGNOSIS — R0602 Shortness of breath: Secondary | ICD-10-CM

## 2010-09-07 DIAGNOSIS — E785 Hyperlipidemia, unspecified: Secondary | ICD-10-CM | POA: Insufficient documentation

## 2010-09-07 DIAGNOSIS — Z87891 Personal history of nicotine dependence: Secondary | ICD-10-CM | POA: Insufficient documentation

## 2010-09-07 DIAGNOSIS — R0609 Other forms of dyspnea: Secondary | ICD-10-CM | POA: Insufficient documentation

## 2010-09-28 ENCOUNTER — Telehealth: Payer: Self-pay | Admitting: *Deleted

## 2010-09-28 DIAGNOSIS — R0602 Shortness of breath: Secondary | ICD-10-CM

## 2010-09-28 DIAGNOSIS — I10 Essential (primary) hypertension: Secondary | ICD-10-CM

## 2010-09-28 NOTE — Telephone Encounter (Signed)
Attempted to call pt to follow up on her SOB - to see if it is any better or worse and if she wants to schedule lexiscan myoview.  Will attempt call again tomorrow.

## 2010-09-29 MED ORDER — OLMESARTAN MEDOXOMIL-HCTZ 40-25 MG PO TABS: 1.0000 | ORAL_TABLET | Freq: Every day | ORAL | Status: DC

## 2010-09-29 NOTE — Telephone Encounter (Signed)
Called pt to follow up on her SOB.  She does report that she is still having it esp. With walking up and  Down stairs.  She has to pace herself and can be limiting.  Review echo and lab results with pt.  She would like to know if Dr Antoine Poche still wants her to have a lexiscan myoview d/t the SOB in light of the echo results and normal BNP.  Pt advised I will forward the question to Dr Antoine Poche and call her back with an answer ASAP.

## 2010-09-30 NOTE — Telephone Encounter (Signed)
Schedule Lexiscan Myoview. 

## 2010-10-08 ENCOUNTER — Other Ambulatory Visit: Payer: Self-pay | Admitting: Family Medicine

## 2010-10-08 DIAGNOSIS — E039 Hypothyroidism, unspecified: Secondary | ICD-10-CM

## 2010-10-15 ENCOUNTER — Other Ambulatory Visit (INDEPENDENT_AMBULATORY_CARE_PROVIDER_SITE_OTHER): Payer: Medicare Other

## 2010-10-15 DIAGNOSIS — E039 Hypothyroidism, unspecified: Secondary | ICD-10-CM

## 2010-10-15 LAB — TSH: TSH: 0.89 u[IU]/mL (ref 0.35–5.50)

## 2010-10-15 LAB — T4, FREE: Free T4: 0.84 ng/dL (ref 0.60–1.60)

## 2010-10-26 ENCOUNTER — Encounter: Payer: Self-pay | Admitting: Family Medicine

## 2010-10-26 ENCOUNTER — Ambulatory Visit (INDEPENDENT_AMBULATORY_CARE_PROVIDER_SITE_OTHER): Payer: Medicare Other | Admitting: Family Medicine

## 2010-10-26 VITALS — BP 120/62 | HR 80 | Temp 98.8°F | Wt 160.0 lb

## 2010-10-26 DIAGNOSIS — J209 Acute bronchitis, unspecified: Secondary | ICD-10-CM

## 2010-10-26 MED ORDER — ALBUTEROL SULFATE HFA 108 (90 BASE) MCG/ACT IN AERS: 2.0000 | INHALATION_SPRAY | Freq: Four times a day (QID) | RESPIRATORY_TRACT | Status: DC | PRN

## 2010-10-26 MED ORDER — AZITHROMYCIN 250 MG PO TABS: ORAL_TABLET | ORAL | Status: AC

## 2010-10-26 NOTE — Progress Notes (Signed)
  Subjective:    Patient ID: Dawn Aguirre, female    DOB: 05-18-1925, 77 y.o.   MRN: [id number]  HPI  Acute Bronchitis: Patient presents for presents evaluation of dyspnea, bilateral ear congestion, nasal congestion, productive cough and sore throat. Symptoms began 10 days ago and are gradually worsening since that time.  Past history is significant for COPD. Per chart -- asthma? Per patient. Has used advair in the past  The PMH, PSH, Social History, Family History, Medications, and allergies have been reviewed in [facility], and have been updated if relevant.   Review of Systems ROS: GEN: Acute illness details above GI: Tolerating PO intake GU: maintaining adequate hydration and urination Pulm: No SOB Interactive and getting along well at home.  Otherwise, ROS is as per the HPI. k    Objective:   Physical Exam   Physical Exam  Blood pressure 120/62, pulse 80, temperature 98.8 F (37.1 C), temperature source Oral, weight 160 lb (72.576 kg), SpO2 96.00%.  GEN: A and O x 3. WDWN. NAD.    ENT: Nose clear, ext NML.  No LAD.  No JVD.  TM's clear. Oropharynx clear.  PULM: Normal WOB, no distress. No crackles, wheezes, rhonchi. Decreased BS CV: RRR, 2/6 SEM, No rubs, No JVD.   ABD: S, NT, ND, + BS. No rebound. No guarding. No HSM.   EXT: warm and well-perfused, No c/c/e. PSYCH: Pleasant and conversant.       Assessment & Plan:   1. Bronchitis, acute  albuterol (PROVENTIL HFA;VENTOLIN HFA) 108 (90 BASE) MCG/ACT inhaler, azithromycin (ZITHROMAX Z-PAK) 250 MG tablet    BRONCHITIS -Viral or baterial infections of the lung. Fever, cough, chest pain, shortness of breath, phlegm production, fatigue are symptoms.  Treatment: 1. Take all medicines 2. Antibiotics  3. Cough suppressants 4. Bronchodilators: an inhaler 5. Expectorant like Guaifenesin (Robitussin, Mucinex)  Fluids and Moisture help: drink lots of fluids Vaporizier or humidifier in room, shower steam --help loosen  secretions and sooth breathing passages  Elevate head slightly when trying to sleep.

## 2010-12-29 ENCOUNTER — Telehealth: Payer: Self-pay | Admitting: Cardiology

## 2010-12-29 NOTE — Telephone Encounter (Signed)
Spoke with pt, she woke at 2 am not feeling well and took her bp and it was 173/92. She also had some left arm pain that was not associated with any other symptoms and she took 2 asa. She feels fine at present and questioned if she could take extra benicar when bp elevated. Explained to pt would be better to take extra norvasc. Pt voiced understanding. Reassurance given to very anxious pt due to the holiday. Dawn Aguirre

## 2010-12-29 NOTE — Telephone Encounter (Signed)
New Msg: Pt calling c/o high BP (possibly associated with the hustle of people coming into town for the holidays). Pt said BP went up 173/92, pt thought it was a little high. Pt also c/o some pains in arm. Pt NOT c/o chest pain. Pt would just like some assurance that everything is ok. Please return pt call to discuss further.

## 2011-01-07 ENCOUNTER — Other Ambulatory Visit: Payer: Self-pay | Admitting: Family Medicine

## 2011-01-07 DIAGNOSIS — Z1231 Encounter for screening mammogram for malignant neoplasm of breast: Secondary | ICD-10-CM

## 2011-02-04 ENCOUNTER — Ambulatory Visit
Admission: RE | Admit: 2011-02-04 | Discharge: 2011-02-04 | Disposition: A | Discharge status: Home / Self Care | Payer: Medicare Other | Source: Ambulatory Visit | Attending: Family Medicine | Admitting: Family Medicine

## 2011-02-04 DIAGNOSIS — Z1231 Encounter for screening mammogram for malignant neoplasm of breast: Secondary | ICD-10-CM

## 2011-02-09 ENCOUNTER — Encounter: Payer: Self-pay | Admitting: *Deleted

## 2011-02-11 ENCOUNTER — Other Ambulatory Visit: Payer: Self-pay | Admitting: Family Medicine

## 2011-02-11 DIAGNOSIS — R928 Other abnormal and inconclusive findings on diagnostic imaging of breast: Secondary | ICD-10-CM

## 2011-02-24 ENCOUNTER — Ambulatory Visit
Admission: RE | Admit: 2011-02-24 | Discharge: 2011-02-24 | Disposition: A | Discharge status: Home / Self Care | Payer: Medicare Other | Source: Ambulatory Visit | Attending: Family Medicine | Admitting: Family Medicine

## 2011-02-24 ENCOUNTER — Other Ambulatory Visit (HOSPITAL_BASED_OUTPATIENT_CLINIC_OR_DEPARTMENT_OTHER): Payer: Self-pay | Admitting: Rheumatology

## 2011-02-24 DIAGNOSIS — R928 Other abnormal and inconclusive findings on diagnostic imaging of breast: Secondary | ICD-10-CM

## 2011-02-24 DIAGNOSIS — M545 Low back pain, unspecified: Secondary | ICD-10-CM

## 2011-03-01 ENCOUNTER — Encounter (HOSPITAL_COMMUNITY): Payer: Medicare Other | Admitting: Radiology

## 2011-03-01 ENCOUNTER — Other Ambulatory Visit (HOSPITAL_BASED_OUTPATIENT_CLINIC_OR_DEPARTMENT_OTHER): Payer: Medicare Other

## 2011-03-01 ENCOUNTER — Other Ambulatory Visit (HOSPITAL_COMMUNITY): Payer: Medicare Other | Admitting: Radiology

## 2011-03-08 ENCOUNTER — Ambulatory Visit (HOSPITAL_BASED_OUTPATIENT_CLINIC_OR_DEPARTMENT_OTHER)
Admission: RE | Admit: 2011-03-08 | Discharge: 2011-03-08 | Disposition: A | Discharge status: Home / Self Care | Payer: Medicare Other | Source: Ambulatory Visit | Attending: Rheumatology | Admitting: Rheumatology

## 2011-03-08 DIAGNOSIS — M519 Unspecified thoracic, thoracolumbar and lumbosacral intervertebral disc disorder: Secondary | ICD-10-CM | POA: Insufficient documentation

## 2011-03-08 DIAGNOSIS — M545 Low back pain, unspecified: Secondary | ICD-10-CM | POA: Insufficient documentation

## 2011-03-08 DIAGNOSIS — M79609 Pain in unspecified limb: Secondary | ICD-10-CM | POA: Insufficient documentation

## 2011-03-08 DIAGNOSIS — M5126 Other intervertebral disc displacement, lumbar region: Secondary | ICD-10-CM | POA: Insufficient documentation

## 2011-03-08 DIAGNOSIS — IMO0001 Reserved for inherently not codable concepts without codable children: Secondary | ICD-10-CM | POA: Insufficient documentation

## 2011-03-09 ENCOUNTER — Encounter: Payer: Self-pay | Admitting: *Deleted

## 2011-03-14 ENCOUNTER — Ambulatory Visit (HOSPITAL_COMMUNITY): Payer: Medicare Other | Attending: Cardiology | Admitting: Radiology

## 2011-03-14 VITALS — Ht 63.0 in | Wt 157.0 lb

## 2011-03-14 DIAGNOSIS — R0989 Other specified symptoms and signs involving the circulatory and respiratory systems: Secondary | ICD-10-CM | POA: Insufficient documentation

## 2011-03-14 DIAGNOSIS — R0602 Shortness of breath: Secondary | ICD-10-CM | POA: Insufficient documentation

## 2011-03-14 DIAGNOSIS — Z87891 Personal history of nicotine dependence: Secondary | ICD-10-CM | POA: Insufficient documentation

## 2011-03-14 DIAGNOSIS — R5381 Other malaise: Secondary | ICD-10-CM | POA: Insufficient documentation

## 2011-03-14 DIAGNOSIS — I10 Essential (primary) hypertension: Secondary | ICD-10-CM | POA: Insufficient documentation

## 2011-03-14 DIAGNOSIS — R0609 Other forms of dyspnea: Secondary | ICD-10-CM | POA: Insufficient documentation

## 2011-03-14 DIAGNOSIS — R112 Nausea with vomiting, unspecified: Secondary | ICD-10-CM | POA: Insufficient documentation

## 2011-03-14 DIAGNOSIS — E785 Hyperlipidemia, unspecified: Secondary | ICD-10-CM | POA: Insufficient documentation

## 2011-03-14 DIAGNOSIS — R42 Dizziness and giddiness: Secondary | ICD-10-CM | POA: Insufficient documentation

## 2011-03-14 DIAGNOSIS — Z8249 Family history of ischemic heart disease and other diseases of the circulatory system: Secondary | ICD-10-CM | POA: Insufficient documentation

## 2011-03-14 DIAGNOSIS — R109 Unspecified abdominal pain: Secondary | ICD-10-CM | POA: Insufficient documentation

## 2011-03-14 DIAGNOSIS — R079 Chest pain, unspecified: Secondary | ICD-10-CM | POA: Insufficient documentation

## 2011-03-14 DIAGNOSIS — M79609 Pain in unspecified limb: Secondary | ICD-10-CM | POA: Insufficient documentation

## 2011-03-14 MED ORDER — TECHNETIUM TC 99M TETROFOSMIN IV KIT
33.0000 | PACK | Freq: Once | INTRAVENOUS | Status: AC | PRN
  Administered 2011-03-14: 33 via INTRAVENOUS

## 2011-03-14 MED ORDER — TECHNETIUM TC 99M TETROFOSMIN IV KIT
11.0000 | PACK | Freq: Once | INTRAVENOUS | Status: AC | PRN
  Administered 2011-03-14: 11 via INTRAVENOUS

## 2011-03-14 MED ORDER — REGADENOSON 0.4 MG/5ML IV SOLN
0.4000 mg | Freq: Once | INTRAVENOUS | Status: AC
  Administered 2011-03-14: 0.4 mg via INTRAVENOUS

## 2011-03-14 NOTE — Progress Notes (Signed)
[facility] SITE 3 NUCLEAR MED [address] [place] [address] [phone]  Cardiology Nuclear Med Study  Dawn Aguirre is a 78 y.o. female [id number] 1925/06/26   Nuclear Med Background Indication for Stress Test:  Evaluation for Ischemia History:  No previous documented CAD '07 Myocardial Perfusion Study-NL, EF=70%, and 7/12 Echo: EF=55-65%, mild to moderate AS Cardiac Risk Factors: Family History - CAD, History of Smoking, Hypertension and Lipids  Symptoms: Chest pain with/without exertion radiates to (L) arm (last occurrence yesterday),  Dizziness, DOE, Fatigue, Fatigue with Exertion, Nausea and Vomiting associated with abdominal pain   Nuclear Pre-Procedure Caffeine/Decaff Intake:  None NPO After: 7:00pm   Lungs:  Clear IV 0.9% NS with Angio Cath:  22g  IV Site: R Hand  IV Started by:  Cathlyn Parsons, RN  Chest Size (in):  38 Cup Size: C  Height: 5\' 3"  (1.6 m)  Weight:  157 lb (71.215 kg)  BMI:  Body mass index is 27.81 kg/(m^2). Tech Comments:  n/a    Nuclear Med Study 1 or 2 day study: 1 day  Stress Test Type:  Lexiscan  Reading MD: Olga Millers, MD  Order Authorizing Provider:  Melany Guernsey  Resting Radionuclide: Technetium 9m Tetrofosmin  Resting Radionuclide Dose: 11.0 mCi   Stress Radionuclide:  Technetium 64m Tetrofosmin  Stress Radionuclide Dose: 33.0 mCi           Stress Protocol Rest HR: 71 99  Rest BP: 157/81 156/82  Exercise Time (min): n/a METS: n/a   Predicted Max HR: 137 bpm % Max HR: 72.26 bpm Rate Pressure Product: 21308   Dose of Adenosine (mg):  n/a Dose of Lexiscan: 0.4 mg  Dose of Atropine (mg): n/a Dose of Dobutamine: n/a mcg/kg/min (at max HR)  Stress Test Technologist: Irean Hong, RN  Nuclear Technologist:  Domenic Polite, CNMT     Rest Procedure:  Myocardial perfusion imaging was performed at rest 45 minutes following the intravenous administration of Technetium 59m Tetrofosmin. Rest ECG: NSR  with poor R wave progression  Stress Procedure:  The patient received IV Lexiscan 0.4 mg over 15-seconds.  Technetium 26m Tetrofosmin injected at 30-seconds.  There were no significant changes with Lexiscan. The patient complained of chest heaviness after lexiscan. Quantitative spect images were obtained after a 45 minute delay. Stress ECG: No significant ST segment change suggestive of ischemia.  QPS Raw Data Images:  Acquisition technically good; normal left ventricular size. Stress Images:  There is decreased uptake in the apex. Rest Images:  There is decreased uptake in the apex. Subtraction (SDS):  No evidence of ischemia. Transient Ischemic Dilatation (Normal <1.22):  1.02 Lung/Heart Ratio (Normal <0.45):  0.31  Quantitative Gated Spect Images QGS EDV:  56 ml QGS ESV:  16 ml QGS cine images:  NL LV Function; NL Wall Motion QGS EF: 72%  Impression Exercise Capacity:  Lexiscan with no exercise. BP Response:  Normal blood pressure response. Clinical Symptoms:  There is chest heaviness. ECG Impression:  No significant ST segment change suggestive of ischemia. Comparison with Prior Nuclear Study: No significant change from previous study  Overall Impression:  Normal stress nuclear study with a small fixed apical defect consistent with thinning; no ischemia.   Olga Millers

## 2011-06-27 ENCOUNTER — Ambulatory Visit (INDEPENDENT_AMBULATORY_CARE_PROVIDER_SITE_OTHER): Payer: Medicare Other | Admitting: Family Medicine

## 2011-06-27 ENCOUNTER — Encounter: Payer: Self-pay | Admitting: Family Medicine

## 2011-06-27 VITALS — BP 130/70 | HR 65 | Temp 98.7°F | Ht 63.0 in | Wt 157.8 lb

## 2011-06-27 DIAGNOSIS — N390 Urinary tract infection, site not specified: Secondary | ICD-10-CM

## 2011-06-27 DIAGNOSIS — R350 Frequency of micturition: Secondary | ICD-10-CM

## 2011-06-27 DIAGNOSIS — R509 Fever, unspecified: Secondary | ICD-10-CM

## 2011-06-27 LAB — CBC WITH DIFFERENTIAL/PLATELET
Basophils Absolute: 0 10*3/uL (ref 0.0–0.1)
Basophils Relative: 0.3 % (ref 0.0–3.0)
Eosinophils Absolute: 0.2 10*3/uL (ref 0.0–0.7)
Eosinophils Relative: 1.8 % (ref 0.0–5.0)
HCT: 40.3 % (ref 36.0–46.0)
Hemoglobin: 13.7 g/dL (ref 12.0–15.0)
Lymphocytes Relative: 13 % (ref 12.0–46.0)
Lymphs Abs: 1.1 10*3/uL (ref 0.7–4.0)
MCHC: 34 g/dL (ref 30.0–36.0)
MCV: 94.3 fl (ref 78.0–100.0)
Monocytes Absolute: 0.7 10*3/uL (ref 0.1–1.0)
Monocytes Relative: 8.6 % (ref 3.0–12.0)
Neutro Abs: 6.5 10*3/uL (ref 1.4–7.7)
Neutrophils Relative %: 76.3 % (ref 43.0–77.0)
Platelets: 209 10*3/uL (ref 150.0–400.0)
RBC: 4.27 Mil/uL (ref 3.87–5.11)
RDW: 12.1 % (ref 11.5–14.6)
WBC: 8.6 10*3/uL (ref 4.5–10.5)

## 2011-06-27 LAB — POCT URINALYSIS DIPSTICK
Bilirubin, UA: NEGATIVE
Glucose, UA: NEGATIVE
Ketones, UA: NEGATIVE
Nitrite, UA: NEGATIVE
Spec Grav, UA: 1.01
Urobilinogen, UA: 0.2
pH, UA: 5

## 2011-06-27 MED ORDER — SULFAMETHOXAZOLE-TRIMETHOPRIM 800-160 MG PO TABS: 1.0000 | ORAL_TABLET | Freq: Two times a day (BID) | ORAL | Status: DC

## 2011-06-27 MED ORDER — SULFAMETHOXAZOLE-TRIMETHOPRIM 800-160 MG PO TABS: 1.0000 | ORAL_TABLET | Freq: Two times a day (BID) | ORAL | Status: AC

## 2011-06-27 NOTE — Progress Notes (Signed)
Subjective:    Patient ID: Dawn Aguirre, female    DOB: [birth year], 78 y.o.   MRN: [id number]  HPI Started getting sick on sat - very tired - went to bed at 7:30 and slept all day yesterday  achey all over and headache over whole head  Nose is runny- clear d/c also post nasal drip  No cough - does have a st however (improved now )   Drinking fluids  R ear bothered her last night   No n/v/d   Fever 102 yesterday  Took tylenol early this am and some 325 mg asa 1 pill   No urinary symptoms that are new - but frequency is baseline No blood in urine or odor  She can sometimes not tell when she has a uti  Has AS  Hx of diverticulitis in past - has constipation- no abd pain however   Patient Active Problem List  Diagnoses  . HYPOTHYROIDISM  . HYPERLIPIDEMIA  . STRESS REACTION, ACUTE, WITH EMOTIONAL DISTURBANCE  . HYPERTENSION  . RAYNAUD'S SYNDROME  . EXTERNAL HEMORRHOIDS  . OTHER ACUTE SINUSITIS  . COPD  . GERD  . GASTRITIS  . DIVERTICULOSIS, COLON  . FIBROCYSTIC BREAST DISEASE  . RHEUMATOID ARTHRITIS  . DISC DISEASE, CERVICAL  . OSTEOPOROSIS  . DYSPNEA ON EXERTION  . COUGH, CHRONIC  . ABDOMINAL BLOATING  . TOBACCO ABUSE, HX OF  . HYSTERECTOMY, HX OF  . Hyperlipidemia  . Aortic stenosis  . Fever  . Urinary frequency   Past Medical History  Diagnosis Date  . Flatulence, eructation, and gas pain   . Chronic airway obstruction, not elsewhere classified   . Cough   . Degeneration of cervical intervertebral disc   . Diverticulosis of colon (without mention of hemorrhage)   . Other dyspnea and respiratory abnormality   . External hemorrhoids without mention of complication   . Diffuse cystic mastopathy   . Unspecified gastritis and gastroduodenitis without mention of hemorrhage   . Esophageal reflux   . Other and unspecified hyperlipidemia   . Unspecified essential hypertension   . Unspecified hypothyroidism   . Acquired absence of organ, genital organs   .  Osteoporosis, unspecified   . Other acute sinusitis   . Raynaud's syndrome   . Rheumatoid arthritis     Sero-negative  . Predominant disturbance of emotions   . Personal history of tobacco use, presenting hazards to health   . Lichen sclerosus     back/abd/vulvar  . Spinal stenosis   . Colitis, ischemic 07/2000    ? infect  . Mastoiditis of right side 04/2005    admitted  . Fibula fracture 2006  . Hyperlipidemia    Past Surgical History  Procedure Date  . Abdominal hysterectomy 1968  . Dexa 2001    OP,heel  . Cardiollite 02/2002    Neg  . Nasal sinus surgery 05/2002  . Dexa     OP hip T-2.94  . US echocardiography 10/06    normal LVF EF 55-65%  . Nuclear stress test 11/07    Normal   History  Substance Use Topics  . Smoking status: Former Smoker    Quit date: 02/07/1962  . Smokeless tobacco: Not on file  . Alcohol Use: No   Family History  Problem Relation Age of Onset  . Heart attack Father 26  . Coronary artery disease Sister 72    CABG  . Heart attack Brother 55  . Coronary artery disease  GF   Allergies  Allergen Reactions  . Ace Inhibitors     REACTION: cough  . Ezetimibe     REACTION: myalgia  . Ezetimibe-Simvastatin     REACTION: joints swell  . Moxifloxacin   . Rosuvastatin     REACTION: myalgia  . Simvastatin     REACTION: myalgia  . Triaminic    Current Outpatient Prescriptions on File Prior to Visit  Medication Sig Dispense Refill  . albuterol (PROVENTIL HFA;VENTOLIN HFA) 108 (90 BASE) MCG/ACT inhaler Inhale 2 puffs into the lungs every 6 (six) hours as needed for wheezing.  1 Inhaler  3  . amLODipine (NORVASC) 5 MG tablet Take 1 tablet (5 mg total) by mouth daily.  30 tablet  11  . atorvastatin (LIPITOR) 40 MG tablet Take 40 mg by mouth daily. With low fat snack       . cholecalciferol (VITAMIN D) 1000 UNITS tablet Take 1,000 Units by mouth daily.        Marland Kitchen guaiFENesin (MUCINEX) 600 MG 12 hr tablet Use as directed       .  guaiFENesin-codeine (ROBITUSSIN AC) 100-10 MG/5ML syrup Take 5 mLs by mouth 2 (two) times daily as needed for cough. Sedation precautions  120 mL  0  . HYDROcodone-acetaminophen (VICODIN) 5-500 MG per tablet Take 1 tablet by mouth 2 (two) times daily as needed.        Marland Kitchen levothyroxine (SYNTHROID, LEVOTHROID) 112 MCG tablet Take 1 tablet (112 mcg total) by mouth daily.  30 tablet  11  . olmesartan-hydrochlorothiazide (BENICAR HCT) 40-25 MG per tablet Take 1 tablet by mouth daily.  30 tablet  11  . Probiotic Product (ALIGN PO) Take 1 capsule by mouth every other day.        . vitamin B-12 (CYANOCOBALAMIN) 500 MCG tablet Take 500 mcg by mouth daily.          Review of Systems Review of Systems  Constitutional: pos for fever, malaise/ fatigue and loss of appetite   Eyes: Negative for pain and visual disturbance.  ENt pos for runny nose / sneeze / neg for congestion or facial pain  Respiratory: Negative for cough and shortness of breath.   Cardiovascular: Negative for cp or palpitations    Gastrointestinal: Negative for nausea, diarrhea and constipation.  Genitourinary: pos for urgency/ freq/ neg for dysuria or hematuria  Skin: Negative for pallor or rash   MSK pos for baseline back pain  Neurological: Negative for weakness, light-headedness, numbness  Hematological: Negative for adenopathy. Does not bruise/bleed easily.  Psychiatric/Behavioral: Negative for dysphoric mood. The patient is not nervous/anxious.         Objective:   Physical Exam  Vitals reviewed. Constitutional: She appears well-developed and well-nourished. No distress.       Fatigued appearing   HENT:  Head: Normocephalic and atraumatic.  Mouth/Throat: Oropharynx is clear and moist. No oropharyngeal exudate.       Nares are mildly congested with clear rhinorrhea  No sinus tenderness Throat is clear   Eyes: Conjunctivae and EOM are normal. Pupils are equal, round, and reactive to light. No scleral icterus.  Neck: Normal  range of motion. Neck supple. No JVD present. Carotid bruit is not present. No thyromegaly present.  Cardiovascular: Normal rate, regular rhythm and intact distal pulses.   Murmur heard. Pulmonary/Chest: Effort normal and breath sounds normal. No respiratory distress. She has no wheezes.  Abdominal: Soft. Bowel sounds are normal. She exhibits no distension and no mass. There is  no tenderness.  Musculoskeletal: She exhibits no edema and no tenderness.  Lymphadenopathy:    She has no cervical adenopathy.  Neurological: She has normal reflexes. No cranial nerve deficit. She exhibits normal muscle tone. Coordination normal.  Skin: Skin is warm and dry. No rash noted. No erythema. No pallor.  Psychiatric: She has a normal mood and affect.          Assessment & Plan:

## 2011-06-27 NOTE — Patient Instructions (Signed)
Labs today - for blood count (in light of the fever) Drink lots of fluids  Urinalysis today - will call you about that Continue tylenol and rest

## 2011-06-27 NOTE — Assessment & Plan Note (Addendum)
With fever and malaise Frequency is baseline but ua pos Will treat

## 2011-06-27 NOTE — Assessment & Plan Note (Signed)
And general malaise - suspect viral syndrome  Has hx of diverticulitis/ also utis  Check urine and cbc today and update Disc symptomatic care - see instructions on AVS  Update if not starting to improve in a week or if worsening

## 2011-06-27 NOTE — Assessment & Plan Note (Signed)
ua pos for leukocytes - with fever/ malaise-no back pain and only urinary symptom as frequency  tx with septra and cx urine  Update if not starting to improve in a week or if worsening

## 2011-06-28 LAB — URINE CULTURE
Colony Count: NO GROWTH
Organism ID, Bacteria: NO GROWTH

## 2011-07-01 ENCOUNTER — Telehealth: Payer: Self-pay

## 2011-07-01 NOTE — Telephone Encounter (Signed)
Patient advised as instructed via telephone. 

## 2011-07-01 NOTE — Telephone Encounter (Signed)
Ok , stop the medicine- do not take any more and let me know if symptoms do not improve

## 2011-07-01 NOTE — Telephone Encounter (Signed)
Pt took 2nd sulfa tab last night started with N&V& diarrhea. Last vomited 8 pm;last diarrhea midnight. Today pt feels shaky,throat is not sore but feels "husky",head congested.Pt has not eaten this AM but is able to keep liquids.No rash,dizziness,trouble breathing or fever. [facility].

## 2011-07-27 ENCOUNTER — Encounter: Payer: Self-pay | Admitting: Family Medicine

## 2011-07-27 ENCOUNTER — Telehealth: Payer: Self-pay | Admitting: Family Medicine

## 2011-07-27 ENCOUNTER — Ambulatory Visit (INDEPENDENT_AMBULATORY_CARE_PROVIDER_SITE_OTHER): Payer: Medicare Other | Admitting: Family Medicine

## 2011-07-27 VITALS — BP 126/78 | HR 79 | Temp 97.6°F | Ht 62.5 in | Wt 159.2 lb

## 2011-07-27 DIAGNOSIS — N390 Urinary tract infection, site not specified: Secondary | ICD-10-CM

## 2011-07-27 DIAGNOSIS — M545 Low back pain, unspecified: Secondary | ICD-10-CM

## 2011-07-27 DIAGNOSIS — R350 Frequency of micturition: Secondary | ICD-10-CM

## 2011-07-27 LAB — POCT URINALYSIS DIPSTICK
Glucose, UA: NEGATIVE
Ketones, UA: NEGATIVE
Nitrite, UA: NEGATIVE
Protein, UA: NEGATIVE
Spec Grav, UA: 1.015
Urobilinogen, UA: 0.2
pH, UA: 6

## 2011-07-27 LAB — POCT UA - MICROSCOPIC ONLY

## 2011-07-27 MED ORDER — CEPHALEXIN 250 MG PO CAPS: 250.0000 mg | ORAL_CAPSULE | Freq: Two times a day (BID) | ORAL | Status: AC

## 2011-07-27 MED ORDER — AMLODIPINE BESYLATE 5 MG PO TABS: 5.0000 mg | ORAL_TABLET | Freq: Every day | ORAL | Status: DC

## 2011-07-27 NOTE — Assessment & Plan Note (Addendum)
ua dip and spin positive for leukocytes/ rbc and bacteria Puzzling that last cx was negative Rev labs / visit from last time with pt  tx with keflex today Was intol of septra- will update  Disc water update / rest Update if not starting to improve in 2-3 d or if worsening   Will call when culture comes back

## 2011-07-27 NOTE — Progress Notes (Signed)
Subjective:    Patient ID: Dawn Aguirre, female    DOB: 1925-03-11, 78 y.o.   MRN: [id number]  HPI Last visit had pos ua - took septra - made her feel sick- lots of side cx was clear  Never feels like she got 100% better   Has ben shaky and not concentrating well  Balance is shaky- not feeling good at all  Not sleeping well  Cranky  A lot of bloating and bladder pressure Burning to urinate  Is constipated - on and off-that adds to it  No appetite   No n/v No fever   ua is positive today   Patient Active Problem List  Diagnosis  . HYPOTHYROIDISM  . HYPERLIPIDEMIA  . STRESS REACTION, ACUTE, WITH EMOTIONAL DISTURBANCE  . HYPERTENSION  . RAYNAUD'S SYNDROME  . EXTERNAL HEMORRHOIDS  . OTHER ACUTE SINUSITIS  . COPD  . GERD  . GASTRITIS  . DIVERTICULOSIS, COLON  . FIBROCYSTIC BREAST DISEASE  . RHEUMATOID ARTHRITIS  . DISC DISEASE, CERVICAL  . OSTEOPOROSIS  . DYSPNEA ON EXERTION  . COUGH, CHRONIC  . ABDOMINAL BLOATING  . TOBACCO ABUSE, HX OF  . HYSTERECTOMY, HX OF  . Hyperlipidemia  . Aortic stenosis  . Fever  . Urinary frequency  . UTI (lower urinary tract infection)   Past Medical History  Diagnosis Date  . Flatulence, eructation, and gas pain   . Chronic airway obstruction, not elsewhere classified   . Cough   . Degeneration of cervical intervertebral disc   . Diverticulosis of colon (without mention of hemorrhage)   . Other dyspnea and respiratory abnormality   . External hemorrhoids without mention of complication   . Diffuse cystic mastopathy   . Unspecified gastritis and gastroduodenitis without mention of hemorrhage   . Esophageal reflux   . Other and unspecified hyperlipidemia   . Unspecified essential hypertension   . Unspecified hypothyroidism   . Acquired absence of organ, genital organs   . Osteoporosis, unspecified   . Other acute sinusitis   . Raynaud's syndrome   . Rheumatoid arthritis     Sero-negative  . Predominant disturbance of  emotions   . Personal history of tobacco use, presenting hazards to health   . Lichen sclerosus     back/abd/vulvar  . Spinal stenosis   . Colitis, ischemic 07/2000    ? infect  . Mastoiditis of right side 04/2005    admitted  . Fibula fracture 2006  . Hyperlipidemia    Past Surgical History  Procedure Date  . Abdominal hysterectomy 1968  . Dexa 2001    OP,heel  . Cardiollite 02/2002    Neg  . Nasal sinus surgery 05/2002  . Dexa     OP hip T-2.94  . US echocardiography 10/06    normal LVF EF 55-65%  . Nuclear stress test 11/07    Normal   History  Substance Use Topics  . Smoking status: Former Smoker    Quit date: 02/07/1962  . Smokeless tobacco: Not on file  . Alcohol Use: No   Family History  Problem Relation Age of Onset  . Heart attack Father 51  . Coronary artery disease Sister 73    CABG  . Heart attack Brother 55  . Coronary artery disease      GF   Allergies  Allergen Reactions  . Ace Inhibitors     REACTION: cough  . Ezetimibe     REACTION: myalgia  . Ezetimibe-Simvastatin     REACTION:  joints swell  . Moxifloxacin   . Rosuvastatin     REACTION: myalgia  . Septra (Sulfamethoxazole W-Trimethoprim)     Sick feeling   . Simvastatin     REACTION: myalgia  . Triaminic    Current Outpatient Prescriptions on File Prior to Visit  Medication Sig Dispense Refill  . albuterol (PROVENTIL HFA;VENTOLIN HFA) 108 (90 BASE) MCG/ACT inhaler Inhale 2 puffs into the lungs every 6 (six) hours as needed for wheezing.  1 Inhaler  3  . amLODipine (NORVASC) 5 MG tablet Take 1 tablet (5 mg total) by mouth daily.  30 tablet  5  . atorvastatin (LIPITOR) 40 MG tablet Take 40 mg by mouth daily. With low fat snack       . cholecalciferol (VITAMIN D) 1000 UNITS tablet Take 1,000 Units by mouth daily.        Marland Kitchen guaiFENesin (MUCINEX) 600 MG 12 hr tablet Use as directed       . guaiFENesin-codeine (ROBITUSSIN AC) 100-10 MG/5ML syrup Take 5 mLs by mouth 2 (two) times daily as  needed for cough. Sedation precautions  120 mL  0  . HYDROcodone-acetaminophen (VICODIN) 5-500 MG per tablet Take 1 tablet by mouth 2 (two) times daily as needed.        Marland Kitchen levothyroxine (SYNTHROID, LEVOTHROID) 112 MCG tablet Take 1 tablet (112 mcg total) by mouth daily.  30 tablet  11  . olmesartan-hydrochlorothiazide (BENICAR HCT) 40-25 MG per tablet Take 1 tablet by mouth daily.  30 tablet  11  . Probiotic Product (ALIGN PO) Take 1 capsule by mouth every other day.        . vitamin B-12 (CYANOCOBALAMIN) 500 MCG tablet Take 500 mcg by mouth daily.           Review of Systems Review of Systems  Constitutional: Negative for fever, appetite change,  and unexpected weight change. pos for fatigue and malaise Eyes: Negative for pain and visual disturbance.  Respiratory: Negative for cough and shortness of breath.   Cardiovascular: Negative for cp or palpitations    Gastrointestinal: Negative for nausea, diarrhea and pos for constipation occasionallu.  Genitourinary: pos for urgency and frequency. and dysuria Skin: Negative for pallor or rash   Neurological: Negative for weakness, light-headedness, numbness and headaches.  Hematological: Negative for adenopathy. Does not bruise/bleed easily.  Psychiatric/Behavioral: Negative for dysphoric mood. The patient is not nervous/anxious.         Objective:   Physical Exam  Constitutional: She appears well-developed and well-nourished. No distress.  HENT:  Head: Normocephalic and atraumatic.  Mouth/Throat: Oropharynx is clear and moist.  Eyes: Conjunctivae and EOM are normal. Pupils are equal, round, and reactive to light.  Neck: Normal range of motion. Neck supple.  Cardiovascular: Normal rate and regular rhythm.   Murmur heard. Pulmonary/Chest: Effort normal and breath sounds normal.  Abdominal: Soft. Bowel sounds are normal. She exhibits no distension and no mass. There is tenderness. There is no rebound and no guarding.       Mild supra  pubic tenderness without fullness   Musculoskeletal:       No cva tenderness   Lymphadenopathy:    She has no cervical adenopathy.  Neurological: She is alert.  Skin: Skin is warm and dry. No rash noted. No pallor.  Psychiatric: She has a normal mood and affect.          Assessment & Plan:

## 2011-07-27 NOTE — Telephone Encounter (Signed)
Caller: Dawn Aguirre/Patient; PCP: Roxy Manns A.; CB#: [phone]; Call regarding Urinary Symptoms; pt states that within the last month she was treated for a UTI; pt states that for the last 3 weeks she is having increase in frequency; feeling lightheaded and no appetite; decrease energy; BP 158/49 this morning on 07/27/11;  Triaged per Urinary Symptoms-Female Guideline; See in 4 hr s/t UTI sx and low back pain; appt made for today at 10:15am Dr Milinda Antis; will comply

## 2011-07-27 NOTE — Patient Instructions (Addendum)
I think you have a uti - take the keflex (cephalexin) as directed  Drink lots of water  Get some rest  We will call you with culture results  Update me if your symptoms worsen or do not improve in 2-3 days

## 2011-07-28 LAB — URINE CULTURE
Colony Count: NO GROWTH
Organism ID, Bacteria: NO GROWTH

## 2011-10-25 ENCOUNTER — Other Ambulatory Visit: Payer: Self-pay | Admitting: Cardiology

## 2011-10-25 NOTE — Telephone Encounter (Signed)
..   Requested Prescriptions   Pending Prescriptions Disp Refills  . BENICAR HCT 40-25 MG per tablet [Pharmacy Med Name: BENICAR HCT 40-25 MG TABLET] 30 each 9    Sig: TAKE 1 TABLET EVERY DAY

## 2011-11-04 ENCOUNTER — Encounter: Payer: Self-pay | Admitting: Family Medicine

## 2011-11-04 ENCOUNTER — Ambulatory Visit (INDEPENDENT_AMBULATORY_CARE_PROVIDER_SITE_OTHER): Payer: Medicare Other | Admitting: Family Medicine

## 2011-11-04 VITALS — BP 136/78 | HR 78 | Temp 98.4°F | Ht 63.0 in | Wt 160.8 lb

## 2011-11-04 DIAGNOSIS — Z23 Encounter for immunization: Secondary | ICD-10-CM

## 2011-11-04 DIAGNOSIS — K5900 Constipation, unspecified: Secondary | ICD-10-CM

## 2011-11-04 DIAGNOSIS — N390 Urinary tract infection, site not specified: Secondary | ICD-10-CM

## 2011-11-04 DIAGNOSIS — R350 Frequency of micturition: Secondary | ICD-10-CM

## 2011-11-04 DIAGNOSIS — R300 Dysuria: Secondary | ICD-10-CM

## 2011-11-04 LAB — POCT URINALYSIS DIPSTICK
Bilirubin, UA: NEGATIVE
Glucose, UA: NEGATIVE
Ketones, UA: NEGATIVE
Nitrite, UA: NEGATIVE
Protein, UA: NEGATIVE
Spec Grav, UA: <=1.005
Urobilinogen, UA: 0.2
pH, UA: 6.5

## 2011-11-04 MED ORDER — ATORVASTATIN CALCIUM 40 MG PO TABS: 40.0000 mg | ORAL_TABLET | Freq: Every day | ORAL | Status: DC

## 2011-11-04 MED ORDER — LEVOTHYROXINE SODIUM 112 MCG PO TABS: 112.0000 ug | ORAL_TABLET | Freq: Every day | ORAL | Status: DC

## 2011-11-04 NOTE — Progress Notes (Signed)
Subjective:    Patient ID: Dawn Aguirre, female    DOB: 08-22-1925, 78 y.o.   MRN: [id number]  HPI Here with urinary symptoms  More problems with prolapse also Frequent urination- up to every hour - really pushes herself to get through the day Makes her feel bad all over  Is trying to drink a lot of water too  Not burning to urinate  Last night felt bloated , esp in bladder area   Last few ua is pos and cx is neg - very puzzling  ? Whether to go to gyn   Also prune juice to help her with chronic constipation - BM is difficult with bladder issues  Has not tried miralax      As usual - a lot of stressors Cares for her son and husband  Husband does not have dementia but he is difficult to get along with at times   Patient Active Problem List  Diagnosis  . HYPOTHYROIDISM  . HYPERLIPIDEMIA  . STRESS REACTION, ACUTE, WITH EMOTIONAL DISTURBANCE  . HYPERTENSION  . RAYNAUD'S SYNDROME  . EXTERNAL HEMORRHOIDS  . OTHER ACUTE SINUSITIS  . COPD  . GERD  . GASTRITIS  . DIVERTICULOSIS, COLON  . FIBROCYSTIC BREAST DISEASE  . RHEUMATOID ARTHRITIS  . DISC DISEASE, CERVICAL  . OSTEOPOROSIS  . DYSPNEA ON EXERTION  . COUGH, CHRONIC  . ABDOMINAL BLOATING  . TOBACCO ABUSE, HX OF  . HYSTERECTOMY, HX OF  . Hyperlipidemia  . Aortic stenosis  . Fever  . Urinary frequency  . UTI (lower urinary tract infection)   Past Medical History  Diagnosis Date  . Flatulence, eructation, and gas pain   . Chronic airway obstruction, not elsewhere classified   . Cough   . Degeneration of cervical intervertebral disc   . Diverticulosis of colon (without mention of hemorrhage)   . Other dyspnea and respiratory abnormality   . External hemorrhoids without mention of complication   . Diffuse cystic mastopathy   . Unspecified gastritis and gastroduodenitis without mention of hemorrhage   . Esophageal reflux   . Other and unspecified hyperlipidemia   . Unspecified essential hypertension   .  Unspecified hypothyroidism   . Acquired absence of organ, genital organs   . Osteoporosis, unspecified   . Other acute sinusitis   . Raynaud's syndrome   . Rheumatoid arthritis     Sero-negative  . Predominant disturbance of emotions   . Personal history of tobacco use, presenting hazards to health   . Lichen sclerosus     back/abd/vulvar  . Spinal stenosis   . Colitis, ischemic 07/2000    ? infect  . Mastoiditis of right side 04/2005    admitted  . Fibula fracture 2006  . Hyperlipidemia    Past Surgical History  Procedure Date  . Abdominal hysterectomy 1968  . Dexa 2001    OP,heel  . Cardiollite 02/2002    Neg  . Nasal sinus surgery 05/2002  . Dexa     OP hip T-2.94  . US echocardiography 10/06    normal LVF EF 55-65%  . Nuclear stress test 11/07    Normal   History  Substance Use Topics  . Smoking status: Former Smoker    Quit date: 02/07/1962  . Smokeless tobacco: Not on file  . Alcohol Use: No   Family History  Problem Relation Age of Onset  . Heart attack Father 66  . Coronary artery disease Sister 55    CABG  . Heart  attack Brother 47  . Coronary artery disease      GF   Allergies  Allergen Reactions  . Ace Inhibitors     REACTION: cough  . Ezetimibe     REACTION: myalgia  . Ezetimibe-Simvastatin     REACTION: joints swell  . Moxifloxacin   . Rosuvastatin     REACTION: myalgia  . Septra (Sulfamethoxazole W-Trimethoprim)     Sick feeling   . Simvastatin     REACTION: myalgia  . Triaminic    Current Outpatient Prescriptions on File Prior to Visit  Medication Sig Dispense Refill  . amLODipine (NORVASC) 5 MG tablet Take 1 tablet (5 mg total) by mouth daily.  30 tablet  5  . BENICAR HCT 40-25 MG per tablet TAKE 1 TABLET EVERY DAY  30 each  9  . cholecalciferol (VITAMIN D) 1000 UNITS tablet Take 1,000 Units by mouth daily.        Marland Kitchen albuterol (PROVENTIL HFA;VENTOLIN HFA) 108 (90 BASE) MCG/ACT inhaler Inhale 2 puffs into the lungs every 6 (six) hours  as needed for wheezing.  1 Inhaler  3  . atorvastatin (LIPITOR) 40 MG tablet Take 40 mg by mouth daily. With low fat snack       . guaiFENesin (MUCINEX) 600 MG 12 hr tablet Use as directed       . guaiFENesin-codeine (ROBITUSSIN AC) 100-10 MG/5ML syrup Take 5 mLs by mouth 2 (two) times daily as needed for cough. Sedation precautions  120 mL  0  . HYDROcodone-acetaminophen (VICODIN) 5-500 MG per tablet Take 1 tablet by mouth 2 (two) times daily as needed.        Marland Kitchen levothyroxine (SYNTHROID, LEVOTHROID) 112 MCG tablet Take 1 tablet (112 mcg total) by mouth daily.  30 tablet  11  . Probiotic Product (ALIGN PO) Take 1 capsule by mouth every other day.        . vitamin B-12 (CYANOCOBALAMIN) 500 MCG tablet Take 500 mcg by mouth daily.            Last week had a brief episode of abd pain -never figured out why   Patient Active Problem List  Diagnosis  . HYPOTHYROIDISM  . HYPERLIPIDEMIA  . STRESS REACTION, ACUTE, WITH EMOTIONAL DISTURBANCE  . HYPERTENSION  . RAYNAUD'S SYNDROME  . EXTERNAL HEMORRHOIDS  . OTHER ACUTE SINUSITIS  . COPD  . GERD  . GASTRITIS  . DIVERTICULOSIS, COLON  . FIBROCYSTIC BREAST DISEASE  . RHEUMATOID ARTHRITIS  . DISC DISEASE, CERVICAL  . OSTEOPOROSIS  . DYSPNEA ON EXERTION  . COUGH, CHRONIC  . ABDOMINAL BLOATING  . TOBACCO ABUSE, HX OF  . HYSTERECTOMY, HX OF  . Hyperlipidemia  . Aortic stenosis  . Fever  . Urinary frequency  . UTI (lower urinary tract infection)   Past Medical History  Diagnosis Date  . Flatulence, eructation, and gas pain   . Chronic airway obstruction, not elsewhere classified   . Cough   . Degeneration of cervical intervertebral disc   . Diverticulosis of colon (without mention of hemorrhage)   . Other dyspnea and respiratory abnormality   . External hemorrhoids without mention of complication   . Diffuse cystic mastopathy   . Unspecified gastritis and gastroduodenitis without mention of hemorrhage   . Esophageal reflux   . Other  and unspecified hyperlipidemia   . Unspecified essential hypertension   . Unspecified hypothyroidism   . Acquired absence of organ, genital organs   . Osteoporosis, unspecified   . Other acute  sinusitis   . Raynaud's syndrome   . Rheumatoid arthritis     Sero-negative  . Predominant disturbance of emotions   . Personal history of tobacco use, presenting hazards to health   . Lichen sclerosus     back/abd/vulvar  . Spinal stenosis   . Colitis, ischemic 07/2000    ? infect  . Mastoiditis of right side 04/2005    admitted  . Fibula fracture 2006  . Hyperlipidemia    Past Surgical History  Procedure Date  . Abdominal hysterectomy 1968  . Dexa 2001    OP,heel  . Cardiollite 02/2002    Neg  . Nasal sinus surgery 05/2002  . Dexa     OP hip T-2.94  . US echocardiography 10/06    normal LVF EF 55-65%  . Nuclear stress test 11/07    Normal   History  Substance Use Topics  . Smoking status: Former Smoker    Quit date: 02/07/1962  . Smokeless tobacco: Not on file  . Alcohol Use: No   Family History  Problem Relation Age of Onset  . Heart attack Father 14  . Coronary artery disease Sister 22    CABG  . Heart attack Brother 55  . Coronary artery disease      GF   Allergies  Allergen Reactions  . Ace Inhibitors     REACTION: cough  . Ezetimibe     REACTION: myalgia  . Ezetimibe-Simvastatin     REACTION: joints swell  . Moxifloxacin   . Rosuvastatin     REACTION: myalgia  . Septra (Sulfamethoxazole W-Trimethoprim)     Sick feeling   . Simvastatin     REACTION: myalgia  . Triaminic    Current Outpatient Prescriptions on File Prior to Visit  Medication Sig Dispense Refill  . amLODipine (NORVASC) 5 MG tablet Take 1 tablet (5 mg total) by mouth daily.  30 tablet  5  . BENICAR HCT 40-25 MG per tablet TAKE 1 TABLET EVERY DAY  30 each  9  . cholecalciferol (VITAMIN D) 1000 UNITS tablet Take 1,000 Units by mouth daily.        Marland Kitchen albuterol (PROVENTIL HFA;VENTOLIN HFA) 108  (90 BASE) MCG/ACT inhaler Inhale 2 puffs into the lungs every 6 (six) hours as needed for wheezing.  1 Inhaler  3  . atorvastatin (LIPITOR) 40 MG tablet Take 40 mg by mouth daily. With low fat snack       . guaiFENesin (MUCINEX) 600 MG 12 hr tablet Use as directed       . guaiFENesin-codeine (ROBITUSSIN AC) 100-10 MG/5ML syrup Take 5 mLs by mouth 2 (two) times daily as needed for cough. Sedation precautions  120 mL  0  . HYDROcodone-acetaminophen (VICODIN) 5-500 MG per tablet Take 1 tablet by mouth 2 (two) times daily as needed.        Marland Kitchen levothyroxine (SYNTHROID, LEVOTHROID) 112 MCG tablet Take 1 tablet (112 mcg total) by mouth daily.  30 tablet  11  . Probiotic Product (ALIGN PO) Take 1 capsule by mouth every other day.        . vitamin B-12 (CYANOCOBALAMIN) 500 MCG tablet Take 500 mcg by mouth daily.             Review of Systems    Review of Systems  Constitutional: Negative for fever, appetite change, fatigue and unexpected weight change.  Eyes: Negative for pain and visual disturbance.  Respiratory: Negative for cough and shortness of breath.   Cardiovascular:  Negative for cp or palpitations    Gastrointestinal: Negative for nausea, diarrhea and constipation.  Genitourinary: pos for urgency and frequency. pos for bladder discomfort and prolapse , neg for dysuria or blood in urine Skin: Negative for pallor or rash   Neurological: Negative for weakness, light-headedness, numbness and headaches.  Hematological: Negative for adenopathy. Does not bruise/bleed easily.  Psychiatric/Behavioral: Negative for dysphoric mood. The patient is not nervous/anxious.      Objective:   Physical Exam  Constitutional: She appears well-developed and well-nourished. No distress.  HENT:  Head: Normocephalic and atraumatic.  Mouth/Throat: Oropharynx is clear and moist.  Eyes: Conjunctivae normal and EOM are normal. Pupils are equal, round, and reactive to light. No scleral icterus.  Neck: Normal  range of motion. Neck supple.  Cardiovascular: Normal rate and regular rhythm.   Pulmonary/Chest: Effort normal and breath sounds normal.  Abdominal: Soft. Bowel sounds are normal. She exhibits no distension and no mass. There is tenderness. There is no rebound and no guarding.       Mild suprapubic tenderness without fullness  Musculoskeletal:       No cva tenderness   Neurological: She is alert.  Skin: Skin is warm and dry. No rash noted. No erythema. No pallor.  Psychiatric: She has a normal mood and affect.          Assessment & Plan:

## 2011-11-04 NOTE — Patient Instructions (Addendum)
We will do urology referral at check out  We will do urine culture and let you know result (if positive will treat with antibiotic) Get miralax and start using every day -- for constipation (prune juice is ok too, keep using it) Makes sure you drink enough water  If worse let me know  Flu shot today

## 2011-11-06 LAB — URINE CULTURE
Colony Count: NO GROWTH
Organism ID, Bacteria: NO GROWTH

## 2011-11-06 LAB — POCT UA - MICROSCOPIC ONLY
Casts, Ur, LPF, POC: 0
Yeast, UA: 0

## 2011-11-06 NOTE — Assessment & Plan Note (Addendum)
Ongoing bladder discomfort/ utis/ frequency and urgency  Prolapse and constipation are aggrivating this  Pt interested in further eval and treatment  Urine cx today- ua is borderline (last 2 cx are neg) Update if not starting to improve in a week or if worsening

## 2011-11-06 NOTE — Assessment & Plan Note (Signed)
See comment for uti Ref to urol for eval  Multi factorial

## 2011-11-06 NOTE — Assessment & Plan Note (Signed)
This may be worsening urinary symptoms and bloated  Adv to continue prune juice and inc fluids in general Also will try miralax otc daily- titrate to effect

## 2012-04-23 ENCOUNTER — Ambulatory Visit: Payer: Medicare Other | Admitting: Family Medicine

## 2012-04-24 ENCOUNTER — Encounter: Payer: Self-pay | Admitting: Family Medicine

## 2012-04-24 ENCOUNTER — Ambulatory Visit (INDEPENDENT_AMBULATORY_CARE_PROVIDER_SITE_OTHER): Payer: Medicare Other | Admitting: Family Medicine

## 2012-04-24 VITALS — BP 148/84 | HR 85 | Temp 98.6°F | Ht 63.0 in | Wt 161.2 lb

## 2012-04-24 DIAGNOSIS — K5900 Constipation, unspecified: Secondary | ICD-10-CM

## 2012-04-24 DIAGNOSIS — M810 Age-related osteoporosis without current pathological fracture: Secondary | ICD-10-CM

## 2012-04-24 DIAGNOSIS — M069 Rheumatoid arthritis, unspecified: Secondary | ICD-10-CM

## 2012-04-24 DIAGNOSIS — I10 Essential (primary) hypertension: Secondary | ICD-10-CM

## 2012-04-24 DIAGNOSIS — M545 Low back pain, unspecified: Secondary | ICD-10-CM

## 2012-04-24 DIAGNOSIS — E039 Hypothyroidism, unspecified: Secondary | ICD-10-CM

## 2012-04-24 DIAGNOSIS — E785 Hyperlipidemia, unspecified: Secondary | ICD-10-CM

## 2012-04-24 DIAGNOSIS — N390 Urinary tract infection, site not specified: Secondary | ICD-10-CM

## 2012-04-24 LAB — CBC WITH DIFFERENTIAL/PLATELET
Basophils Absolute: 0 10*3/uL (ref 0.0–0.1)
Basophils Relative: 0.3 % (ref 0.0–3.0)
Eosinophils Absolute: 0.2 10*3/uL (ref 0.0–0.7)
Eosinophils Relative: 3.2 % (ref 0.0–5.0)
HCT: 36.8 % (ref 36.0–46.0)
Hemoglobin: 12.5 g/dL (ref 12.0–15.0)
Lymphocytes Relative: 28.5 % (ref 12.0–46.0)
Lymphs Abs: 1.6 10*3/uL (ref 0.7–4.0)
MCHC: 33.9 g/dL (ref 30.0–36.0)
MCV: 91.6 fl (ref 78.0–100.0)
Monocytes Absolute: 0.4 10*3/uL (ref 0.1–1.0)
Monocytes Relative: 6.4 % (ref 3.0–12.0)
Neutro Abs: 3.4 10*3/uL (ref 1.4–7.7)
Neutrophils Relative %: 61.6 % (ref 43.0–77.0)
Platelets: 231 10*3/uL (ref 150.0–400.0)
RBC: 4.01 Mil/uL (ref 3.87–5.11)
RDW: 12.4 % (ref 11.5–14.6)
WBC: 5.5 10*3/uL (ref 4.5–10.5)

## 2012-04-24 LAB — POCT URINALYSIS DIPSTICK
Bilirubin, UA: NEGATIVE
Glucose, UA: NEGATIVE
Ketones, UA: NEGATIVE
Nitrite, UA: NEGATIVE
Protein, UA: NEGATIVE
Spec Grav, UA: 1.01
Urobilinogen, UA: 0.2
pH, UA: 7

## 2012-04-24 LAB — COMPREHENSIVE METABOLIC PANEL
ALT: 24 U/L (ref 0–35)
AST: 24 U/L (ref 0–37)
Albumin: 4.2 g/dL (ref 3.5–5.2)
Alkaline Phosphatase: 62 U/L (ref 39–117)
BUN: 19 mg/dL (ref 6–23)
CO2: 25 mEq/L (ref 19–32)
Calcium: 9 mg/dL (ref 8.4–10.5)
Chloride: 102 mEq/L (ref 96–112)
Creatinine, Ser: 1 mg/dL (ref 0.4–1.2)
GFR: 57.33 mL/min — ABNORMAL LOW (ref >60.00)
Glucose, Bld: 92 mg/dL (ref 70–99)
Potassium: 4 mEq/L (ref 3.5–5.1)
Sodium: 136 mEq/L (ref 135–145)
Total Bilirubin: 0.7 mg/dL (ref 0.3–1.2)
Total Protein: 7.3 g/dL (ref 6.0–8.3)

## 2012-04-24 LAB — POCT UA - MICROSCOPIC ONLY
Casts, Ur, LPF, POC: 0
Yeast, UA: 0

## 2012-04-24 LAB — LIPID PANEL
Cholesterol: 200 mg/dL (ref 0–200)
HDL: 62.2 mg/dL (ref >39.00)
LDL Cholesterol: 118 mg/dL — ABNORMAL HIGH (ref 0–99)
Total CHOL/HDL Ratio: 3
Triglycerides: 99 mg/dL (ref 0.0–149.0)
VLDL: 19.8 mg/dL (ref 0.0–40.0)

## 2012-04-24 LAB — TSH: TSH: 3.63 u[IU]/mL (ref 0.35–5.50)

## 2012-04-24 NOTE — Assessment & Plan Note (Signed)
On atorvastatin - cannot help but wonder if this is adding to widespread joint and myofasical pain  Will hold it for 2 wk and update Disc low sat fat diet  Lab today

## 2012-04-24 NOTE — Assessment & Plan Note (Signed)
bp labile - may have to do with pain / lack of sleep and stressors -will check lab today and monitor closely

## 2012-04-24 NOTE — Assessment & Plan Note (Signed)
Hold atorvastain for pain and update Lab today

## 2012-04-24 NOTE — Progress Notes (Signed)
  Subjective:    Patient ID: Dawn Aguirre, female    DOB: 10-Dec-1925, 79 y.o.   MRN: [id number]  HPI    Review of Systems Review of Systems  Constitutional: Negative for fever, appetite change,  and unexpected weight change.  Eyes: Negative for pain and visual disturbance.  ENT pos for post nasal drip/ neg for sinus pain  Respiratory: Negative for cough and shortness of breath.   Cardiovascular: Negative for cp or palpitations    Gastrointestinal: Negative for nausea, diarrhea and constipation.  Genitourinary: Negative for urgency and frequency. pos for incontinence from cystocele Skin: Negative for pallor or rash  pos for red spots on skin of feet Neurological: Negative for weakness, light-headedness, numbness and headaches. pos for generalized weakness at times and one episode of tremor MSK pos for widespread joint and myofascial pain  Hematological: Negative for adenopathy. Does not bruise/bleed easily.  Psychiatric/Behavioral: Negative for dysphoric mood. The patient is  nervous/anxious.  (much stress)       Objective:   Physical Exam  Constitutional: She appears well-developed and well-nourished. No distress.  Well appearing elderly female  HENT:  Head: Normocephalic and atraumatic.  Mouth/Throat: Oropharynx is clear and moist. No oropharyngeal exudate.  Eyes: Conjunctivae and EOM are normal. Pupils are equal, round, and reactive to light. Right eye exhibits no discharge. Left eye exhibits no discharge. No scleral icterus.  Neck: Normal range of motion. Neck supple. No JVD present. Carotid bruit is not present. No thyromegaly present.  Cardiovascular: Normal rate, regular rhythm and intact distal pulses.  Exam reveals no gallop.   Murmur heard. Pulmonary/Chest: Effort normal and breath sounds normal. No respiratory distress. She has no wheezes. She has no rales.  No crackles   Abdominal: Soft. Bowel sounds are normal. She exhibits no distension, no abdominal bruit, no  pulsatile midline mass and no mass. There is no hepatosplenomegaly. There is no tenderness. There is no rebound, no guarding and no CVA tenderness.  Musculoskeletal: She exhibits tenderness. She exhibits no edema.  Diffuse joint tenderness  some ulnar deviation of fingers with enl of MCP joints  Lymphadenopathy:    She has no cervical adenopathy.  Neurological: She is alert. She has normal reflexes. She displays no atrophy and no tremor. No cranial nerve deficit or sensory deficit. She exhibits normal muscle tone. Coordination and gait normal.  No focal cerebellar signs  Skin: Skin is warm and dry. No rash noted. There is erythema. No pallor.  Few areas of erythema/ hyperpigmentation on top of feet  Psychiatric: She has a normal mood and affect.  At times admits she is nervous- overall talkative and pleasant and mentally sharp          Assessment & Plan:

## 2012-04-24 NOTE — Assessment & Plan Note (Signed)
With more and more widespread pain - worse at night causing sleep dysfunction and deformity of hands  Disc poss it is time to consider immune mod tx - she will f/u with her rheumatologist  She continues to avoid pain medicine as much as possible

## 2012-04-24 NOTE — Patient Instructions (Addendum)
Hold lipitor (generic) for 2 weeks to see if pain gets better Follow up with Dr Jon Billings to discuss pain and options to treat rheumatoid disease  For constipation - miralax - up to twice daily - get on a schedule that works for you Also more water intake! Urine culture today Labs today  Go back to taking B12 over the counter  Take your vitamin D

## 2012-04-24 NOTE — Assessment & Plan Note (Signed)
Check D level today   

## 2012-04-24 NOTE — Assessment & Plan Note (Signed)
Check tsh today-pt is more fatigued and also constipated In addition -one episode of tremor that may or may not be related

## 2012-04-24 NOTE — Assessment & Plan Note (Signed)
This is worse again- making her rectocele symptoms more pronounced and causing discomfort in back and abd  Recommend starting miralax regimen up to bid - and update if not imp (she has been able to control this in the past) Also emph imp of drinking more water

## 2012-04-24 NOTE — Progress Notes (Signed)
  Subjective:    Patient ID: Dawn Aguirre, female    DOB: 1925/09/16, 79 y.o.   MRN: [id number]  HPI Here for several complaints today including bp issues/tremor/ back pain/ trouble sleeping  She has a lot of pain- all over/ arthritis related and this keeps her up at night  Bursitis in her hip is worse  Has not seen Dr Jon Billings for cortisone shots for this many times  She declines narcotics, and has tried tramadol in the past  Has rheumatoid arthritis  All joints hurt  Has some red spots on the top of her feet   Is on lipitor - ? If this causes her pain   Has no energy   Is time to check her thyroid - has had tremor and constipation     Yesterday had a bad episode of tremors all over - shaking - her husband held on to her for a while   ua today is pos for blood and leukocytes  She saw the urologist  Does not feel like a bladder sling would be worth while  Has a rectocele that is problematic when constipated  ? If not drinking enough water  Goes a week without bm at times  She drinks prune juice       Review of Systems     Objective:   Physical Exam        Assessment & Plan:

## 2012-04-24 NOTE — Assessment & Plan Note (Signed)
ua is positive-sent for cx , is not having new sympt except for back pain  Has hx of pos ua with neg cx in past and has seen urology for this and cystocele Will tx if pos cx

## 2012-04-25 ENCOUNTER — Telehealth: Payer: Self-pay | Admitting: *Deleted

## 2012-04-25 LAB — VITAMIN D 25 HYDROXY (VIT D DEFICIENCY, FRACTURES): Vit D, 25-Hydroxy: 24 ng/mL — ABNORMAL LOW (ref 30–89)

## 2012-04-25 MED ORDER — TRAMADOL HCL 50 MG PO TABS: 50.0000 mg | ORAL_TABLET | Freq: Three times a day (TID) | ORAL | Status: DC | PRN

## 2012-04-25 NOTE — Telephone Encounter (Signed)
Tramadol is fine - watch out for sedation - she has taken it before and done well  Please call in or send elect

## 2012-04-25 NOTE — Telephone Encounter (Signed)
I called pt to advise her of lab results and she wanted me to ask you to prescribe something for her pain pt said she has had tramadol in the past and that has worked. Pt said she has pain in her lower back, shoulders and legs, please advise

## 2012-04-25 NOTE — Telephone Encounter (Signed)
Pt notified Rx sent to pharmacy

## 2012-04-26 LAB — URINE CULTURE
Colony Count: NO GROWTH
Organism ID, Bacteria: NO GROWTH

## 2012-09-07 ENCOUNTER — Encounter: Payer: Self-pay | Admitting: Family Medicine

## 2012-09-07 ENCOUNTER — Ambulatory Visit (INDEPENDENT_AMBULATORY_CARE_PROVIDER_SITE_OTHER)
Admission: RE | Admit: 2012-09-07 | Discharge: 2012-09-07 | Disposition: A | Discharge status: Home / Self Care | Payer: Medicare Other | Source: Ambulatory Visit | Attending: Family Medicine | Admitting: Family Medicine

## 2012-09-07 ENCOUNTER — Ambulatory Visit (INDEPENDENT_AMBULATORY_CARE_PROVIDER_SITE_OTHER): Payer: Medicare Other | Admitting: Family Medicine

## 2012-09-07 VITALS — BP 110/78 | HR 70 | Temp 98.2°F | Wt 154.5 lb

## 2012-09-07 DIAGNOSIS — M545 Low back pain, unspecified: Secondary | ICD-10-CM

## 2012-09-07 DIAGNOSIS — R8290 Unspecified abnormal findings in urine: Secondary | ICD-10-CM

## 2012-09-07 DIAGNOSIS — R82998 Other abnormal findings in urine: Secondary | ICD-10-CM

## 2012-09-07 LAB — POCT URINALYSIS DIPSTICK
Glucose, UA: NEGATIVE
Nitrite, UA: NEGATIVE
Protein, UA: NEGATIVE
Spec Grav, UA: 1.025
Urobilinogen, UA: 0.2
pH, UA: 6

## 2012-09-07 MED ORDER — TRAMADOL HCL 50 MG PO TABS: ORAL_TABLET | ORAL | Status: DC

## 2012-09-07 MED ORDER — CYCLOBENZAPRINE HCL 10 MG PO TABS: 10.0000 mg | ORAL_TABLET | Freq: Three times a day (TID) | ORAL | Status: DC | PRN

## 2012-09-07 NOTE — Patient Instructions (Addendum)
Xray of lumbar spine today  Use ice or heat on your back - whatever feels better  If you suddenly get numbness or weakness - let me know  Use tramadol as needed -up to 2 pills three times per day  Flexeril is a muscle relaxer - use that as needed -watch out for sedation with each of these medicines  Follow up with me in about a week  Leave a urine specimen on the way out

## 2012-09-07 NOTE — Progress Notes (Signed)
Subjective:    Patient ID: Dawn Aguirre, female    DOB: 08-Oct-1925, 79 y.o.   MRN: [id number]  HPI Here with low back pain since Tuesday  She carried some books and was using the stairs a lot the prior weekend  Started in L side and rad to the right  Most comfortable to stand and lean forward on something Hurts a lot to lift her L leg   Cannot sleep at night  Has frequent urination baseline  No numbness or weakness   She took tramadol 1 every 8 hours    Hx of deg disc cervical Also spinal stenosis -in neck  No significant hx of LS problems   Has had bursitis of hip on R side   Patient Active Problem List   Diagnosis Date Noted  . Constipation 11/04/2011  . UTI (lower urinary tract infection) 06/27/2011  . Aortic stenosis 08/31/2010  . Hyperlipidemia   . OTHER ACUTE SINUSITIS 07/02/2009  . GASTRITIS 04/01/2009  . ABDOMINAL BLOATING 04/01/2009  . STRESS REACTION, ACUTE, WITH EMOTIONAL DISTURBANCE 10/12/2007  . DISC DISEASE, CERVICAL 10/12/2007  . HYPOTHYROIDISM 01/22/2007  . RAYNAUD'S SYNDROME 01/22/2007  . EXTERNAL HEMORRHOIDS 01/22/2007  . COPD 01/22/2007  . DIVERTICULOSIS, COLON 01/22/2007  . FIBROCYSTIC BREAST DISEASE 01/22/2007  . OSTEOPOROSIS 01/22/2007  . HYPERLIPIDEMIA 12/16/2006  . HYPERTENSION 12/16/2006  . GERD 12/16/2006  . RHEUMATOID ARTHRITIS 12/16/2006  . DYSPNEA ON EXERTION 12/16/2006  . COUGH, CHRONIC 12/16/2006  . TOBACCO ABUSE, HX OF 12/16/2006  . HYSTERECTOMY, HX OF 12/16/2006   Past Medical History  Diagnosis Date  . Flatulence, eructation, and gas pain   . Chronic airway obstruction, not elsewhere classified   . Cough   . Degeneration of cervical intervertebral disc   . Diverticulosis of colon (without mention of hemorrhage)   . Other dyspnea and respiratory abnormality   . External hemorrhoids without mention of complication   . Diffuse cystic mastopathy   . Unspecified gastritis and gastroduodenitis without mention of hemorrhage    . Esophageal reflux   . Other and unspecified hyperlipidemia   . Unspecified essential hypertension   . Unspecified hypothyroidism   . Acquired absence of organ, genital organs   . Osteoporosis, unspecified   . Other acute sinusitis   . Raynaud's syndrome   . Rheumatoid arthritis(714.0)     Sero-negative  . Predominant disturbance of emotions   . Personal history of tobacco use, presenting hazards to health   . Lichen sclerosus     back/abd/vulvar  . Spinal stenosis   . Colitis, ischemic 07/2000    ? infect  . Mastoiditis of right side 04/2005    admitted  . Fibula fracture 2006  . Hyperlipidemia    Past Surgical History  Procedure Laterality Date  . Abdominal hysterectomy  1968  . Dexa  2001    OP,heel  . Cardiollite  02/2002    Neg  . Nasal sinus surgery  05/2002  . Dexa      OP hip T-2.94  . US echocardiography  10/06    normal LVF EF 55-65%  . Nuclear stress test  11/07    Normal   History  Substance Use Topics  . Smoking status: Former Smoker    Quit date: 02/07/1962  . Smokeless tobacco: Not on file  . Alcohol Use: No   Family History  Problem Relation Age of Onset  . Heart attack Father 27  . Coronary artery disease Sister 9    CABG  .  Heart attack Brother 55  . Coronary artery disease      GF   Allergies  Allergen Reactions  . Ace Inhibitors     REACTION: cough  . Ezetimibe     REACTION: myalgia  . Ezetimibe-Simvastatin     REACTION: joints swell  . Moxifloxacin   . Rosuvastatin     REACTION: myalgia  . Septra (Sulfamethoxazole W-Trimethoprim)     Sick feeling   . Simvastatin     REACTION: myalgia  . Triaminic    Current Outpatient Prescriptions on File Prior to Visit  Medication Sig Dispense Refill  . BENICAR HCT 40-25 MG per tablet TAKE 1 TABLET EVERY DAY  30 each  9  . cholecalciferol (VITAMIN D) 1000 UNITS tablet Take 1,000 Units by mouth daily.        Marland Kitchen levothyroxine (SYNTHROID, LEVOTHROID) 112 MCG tablet Take 1 tablet (112 mcg  total) by mouth daily.  90 tablet  3  . traMADol (ULTRAM) 50 MG tablet Take 1 tablet (50 mg total) by mouth every 8 (eight) hours as needed for pain.  30 tablet  2  . albuterol (PROVENTIL HFA;VENTOLIN HFA) 108 (90 BASE) MCG/ACT inhaler Inhale 2 puffs into the lungs every 6 (six) hours as needed for wheezing.  1 Inhaler  3  . amLODipine (NORVASC) 5 MG tablet Take 1 tablet (5 mg total) by mouth daily.  30 tablet  5  . atorvastatin (LIPITOR) 40 MG tablet Take 1 tablet (40 mg total) by mouth daily. With low fat snack  90 tablet  3   No current facility-administered medications on file prior to visit.      Review of Systems Review of Systems  Constitutional: Negative for fever, appetite change, fatigue and unexpected weight change.  Eyes: Negative for pain and visual disturbance.  Respiratory: Negative for cough and shortness of breath.   Cardiovascular: Negative for cp or palpitations    Gastrointestinal: Negative for nausea, diarrhea and constipation.  Genitourinary: pos for urgency and frequency. (cystocele and rectocele-is baseline for her) Skin: Negative for pallor or rash   MSK pos for low back pain - worse to lie and sit / with radiation to L leg  Neurological: Negative for weakness, light-headedness, numbness and headaches.  Hematological: Negative for adenopathy. Does not bruise/bleed easily.  Psychiatric/Behavioral: Negative for dysphoric mood. The patient is not nervous/anxious.         Objective:   Physical Exam  Constitutional: She appears well-developed and well-nourished. No distress.  Uncomfortable with back pain but not distressed   HENT:  Head: Normocephalic and atraumatic.  Mouth/Throat: Oropharynx is clear and moist.  Eyes: Conjunctivae and EOM are normal. Pupils are equal, round, and reactive to light. Right eye exhibits no discharge. Left eye exhibits no discharge. No scleral icterus.  Neck: Normal range of motion. Neck supple. No JVD present. Carotid bruit is not  present. No thyromegaly present.  Cardiovascular: Normal rate and regular rhythm.   Pulmonary/Chest: Effort normal and breath sounds normal.  Abdominal: She exhibits no abdominal bruit.  Musculoskeletal: She exhibits tenderness. She exhibits no edema.  Tender over L3-L4 Very tender over L lumbar musculature and SI joint Nl rom hip  SLR on L pos for back pain -not leg pain  Loss of lordosis noted in LS  Lymphadenopathy:    She has no cervical adenopathy.  Neurological: She is alert. She has normal strength and normal reflexes. She displays no atrophy. No sensory deficit. She exhibits normal muscle tone. Coordination normal.  Skin: Skin is warm and dry. No rash noted.  Psychiatric: She has a normal mood and affect.          Assessment & Plan:

## 2012-09-09 LAB — URINE CULTURE
Colony Count: NO GROWTH
Organism ID, Bacteria: NO GROWTH

## 2012-09-09 NOTE — Assessment & Plan Note (Signed)
Xray today Some radiculopathy symptoms to her leg - without numbness or weakness  Has tramadol for pain  Added flexeril to use with caution - along with heat on L lumbar musculature Given handout  ua as well- no urine symptoms however and pain is position

## 2012-09-09 NOTE — Assessment & Plan Note (Signed)
Sent for culture Do not think that back pain has a urinary etiology

## 2012-09-14 ENCOUNTER — Ambulatory Visit (INDEPENDENT_AMBULATORY_CARE_PROVIDER_SITE_OTHER): Payer: Medicare Other | Admitting: Family Medicine

## 2012-09-14 ENCOUNTER — Encounter: Payer: Self-pay | Admitting: Family Medicine

## 2012-09-14 VITALS — BP 146/88 | HR 80 | Temp 98.9°F | Ht 63.0 in | Wt 153.2 lb

## 2012-09-14 DIAGNOSIS — M545 Low back pain, unspecified: Secondary | ICD-10-CM

## 2012-09-14 DIAGNOSIS — H8110 Benign paroxysmal vertigo, unspecified ear: Secondary | ICD-10-CM | POA: Insufficient documentation

## 2012-09-14 DIAGNOSIS — K5900 Constipation, unspecified: Secondary | ICD-10-CM

## 2012-09-14 MED ORDER — SCOPOLAMINE 1 MG/3DAYS TD PT72: 1.0000 | MEDICATED_PATCH | TRANSDERMAL | Status: DC

## 2012-09-14 NOTE — Patient Instructions (Addendum)
Try some heat on your back (gently) to keep muscles relaxed  After your trip - we may consider some physical therapy (or referral to orthopedics) For motion sickness / vertigo-here is a px for the scop patch (or you can use dramamine over the counter)- and remember it can cause sedation and constipation  If back pain worsens-let me know

## 2012-09-14 NOTE — Progress Notes (Signed)
Subjective:    Patient ID: Dawn Aguirre, female    DOB: 08/08/1925, 79 y.o.   MRN: [id number]  HPI Here for f/u of back pain   Her pain is improved - but not gone  Used the flexeril -and it constipated her somewhat -that is improved Is generally bloated  Xray LS showed some deg disc dz as well as some bone spurring  She wants to give it more time  She has not tried heat on her back yet    Now back pain goes to both legs-now mostly the right one (it was previously the left)  Going on a cruise    Last night her shoulder muscles were very tight and she had some vertigo also  Feels much better today   Patient Active Problem List   Diagnosis Date Noted  . Low back pain 09/07/2012  . Abnormal urinalysis 09/07/2012  . Constipation 11/04/2011  . UTI (lower urinary tract infection) 06/27/2011  . Aortic stenosis 08/31/2010  . Hyperlipidemia   . OTHER ACUTE SINUSITIS 07/02/2009  . GASTRITIS 04/01/2009  . ABDOMINAL BLOATING 04/01/2009  . STRESS REACTION, ACUTE, WITH EMOTIONAL DISTURBANCE 10/12/2007  . DISC DISEASE, CERVICAL 10/12/2007  . HYPOTHYROIDISM 01/22/2007  . RAYNAUD'S SYNDROME 01/22/2007  . EXTERNAL HEMORRHOIDS 01/22/2007  . COPD 01/22/2007  . DIVERTICULOSIS, COLON 01/22/2007  . FIBROCYSTIC BREAST DISEASE 01/22/2007  . OSTEOPOROSIS 01/22/2007  . HYPERLIPIDEMIA 12/16/2006  . HYPERTENSION 12/16/2006  . GERD 12/16/2006  . RHEUMATOID ARTHRITIS 12/16/2006  . DYSPNEA ON EXERTION 12/16/2006  . COUGH, CHRONIC 12/16/2006  . TOBACCO ABUSE, HX OF 12/16/2006  . HYSTERECTOMY, HX OF 12/16/2006   Past Medical History  Diagnosis Date  . Flatulence, eructation, and gas pain   . Chronic airway obstruction, not elsewhere classified   . Cough   . Degeneration of cervical intervertebral disc   . Diverticulosis of colon (without mention of hemorrhage)   . Other dyspnea and respiratory abnormality   . External hemorrhoids without mention of complication   . Diffuse cystic  mastopathy   . Unspecified gastritis and gastroduodenitis without mention of hemorrhage   . Esophageal reflux   . Other and unspecified hyperlipidemia   . Unspecified essential hypertension   . Unspecified hypothyroidism   . Acquired absence of organ, genital organs   . Osteoporosis, unspecified   . Other acute sinusitis   . Raynaud's syndrome   . Rheumatoid arthritis(714.0)     Sero-negative  . Predominant disturbance of emotions   . Personal history of tobacco use, presenting hazards to health   . Lichen sclerosus     back/abd/vulvar  . Spinal stenosis   . Colitis, ischemic 07/2000    ? infect  . Mastoiditis of right side 04/2005    admitted  . Fibula fracture 2006  . Hyperlipidemia    Past Surgical History  Procedure Laterality Date  . Abdominal hysterectomy  1968  . Dexa  2001    OP,heel  . Cardiollite  02/2002    Neg  . Nasal sinus surgery  05/2002  . Dexa      OP hip T-2.94  . US echocardiography  10/06    normal LVF EF 55-65%  . Nuclear stress test  11/07    Normal   History  Substance Use Topics  . Smoking status: Former Smoker    Quit date: 02/07/1962  . Smokeless tobacco: Not on file  . Alcohol Use: No   Family History  Problem Relation Age of Onset  . Heart  attack Father 19  . Coronary artery disease Sister 19    CABG  . Heart attack Brother 55  . Coronary artery disease      GF   Allergies  Allergen Reactions  . Ace Inhibitors     REACTION: cough  . Ezetimibe     REACTION: myalgia  . Ezetimibe-Simvastatin     REACTION: joints swell  . Moxifloxacin   . Rosuvastatin     REACTION: myalgia  . Septra (Sulfamethoxazole W-Trimethoprim)     Sick feeling   . Simvastatin     REACTION: myalgia  . Triaminic    Current Outpatient Prescriptions on File Prior to Visit  Medication Sig Dispense Refill  . albuterol (PROVENTIL HFA;VENTOLIN HFA) 108 (90 BASE) MCG/ACT inhaler Inhale 2 puffs into the lungs every 6 (six) hours as needed for wheezing.  1  Inhaler  3  . amLODipine (NORVASC) 5 MG tablet Take 1 tablet (5 mg total) by mouth daily.  30 tablet  5  . atorvastatin (LIPITOR) 40 MG tablet Take 1 tablet (40 mg total) by mouth daily. With low fat snack  90 tablet  3  . BENICAR HCT 40-25 MG per tablet TAKE 1 TABLET EVERY DAY  30 each  9  . cholecalciferol (VITAMIN D) 1000 UNITS tablet Take 1,000 Units by mouth daily.        . cyclobenzaprine (FLEXERIL) 10 MG tablet Take 1 tablet (10 mg total) by mouth 3 (three) times daily as needed for muscle spasms.  30 tablet  1  . levothyroxine (SYNTHROID, LEVOTHROID) 112 MCG tablet Take 1 tablet (112 mcg total) by mouth daily.  90 tablet  3  . traMADol (ULTRAM) 50 MG tablet 1-2 pills by mouth up to three times daily as needed for pain  90 tablet  0   No current facility-administered medications on file prior to visit.    Review of Systems Review of Systems  Constitutional: Negative for fever, appetite change, fatigue and unexpected weight change.  Eyes: Negative for pain and visual disturbance.  Respiratory: Negative for cough and shortness of breath.   Cardiovascular: Negative for cp or palpitations    Gastrointestinal: Negative for nausea, diarrhea and constipation.  Genitourinary: Negative for urgency and frequency.  Skin: Negative for pallor or rash   MSK pos for back pain with rad to legs-overall imp, pos for tight muscles in neck Neurological: Negative for weakness, light-headedness, numbness and headaches. pos for vertigo last night that is gone now Hematological: Negative for adenopathy. Does not bruise/bleed easily.  Psychiatric/Behavioral: Negative for dysphoric mood. The patient is not nervous/anxious.         Objective:   Physical Exam  Constitutional: She appears well-developed and well-nourished. No distress.  HENT:  Head: Normocephalic and atraumatic.  Eyes: Conjunctivae and EOM are normal. Pupils are equal, round, and reactive to light.  No nystagmus  Neck: Normal range of  motion. Neck supple.  Nl rom No bony tenderness  Cardiovascular: Normal rate and regular rhythm.   Murmur heard. Pulmonary/Chest: Effort normal and breath sounds normal. No respiratory distress. She has no wheezes. She has no rales.  Abdominal: Soft. Bowel sounds are normal.  Musculoskeletal: She exhibits no edema and no tenderness.  No LS tenderness Mild perilumbar spasm bilaterally Nl rom  Nl gait  Lymphadenopathy:    She has no cervical adenopathy.  Neurological: She is alert. She has normal reflexes. No cranial nerve deficit. She exhibits normal muscle tone. Coordination normal.  Skin: Skin is warm  and dry. No rash noted. No erythema. No pallor.  Psychiatric: She has a normal mood and affect.          Assessment & Plan:

## 2012-09-16 NOTE — Assessment & Plan Note (Signed)
Ongoing Worse with med for back pain Disc regime to help

## 2012-09-16 NOTE — Assessment & Plan Note (Signed)
Overall much improved Rev XR with deg changes-disc and spine- but no fractures  Flexeril makes her constipated - dealing with that  Urine cx was neg If no further imp will consider PT or ortho consult

## 2012-09-16 NOTE — Assessment & Plan Note (Signed)
Had a bout last night that is totally resolved  Disc poss for motion sickness on cruise upcoming Given px for scop patch or opt of getting dramamine otc --hopefully will need neither  Rev side eff incl sedation and constipation

## 2012-11-18 ENCOUNTER — Other Ambulatory Visit: Payer: Self-pay | Admitting: Family Medicine

## 2012-11-22 ENCOUNTER — Ambulatory Visit (INDEPENDENT_AMBULATORY_CARE_PROVIDER_SITE_OTHER): Payer: Medicare Other

## 2012-11-22 DIAGNOSIS — Z23 Encounter for immunization: Secondary | ICD-10-CM

## 2012-12-07 ENCOUNTER — Telehealth: Payer: Self-pay

## 2012-12-07 ENCOUNTER — Other Ambulatory Visit: Payer: Self-pay | Admitting: Cardiology

## 2012-12-07 NOTE — Telephone Encounter (Signed)
Noted. Agree.

## 2012-12-07 NOTE — Telephone Encounter (Signed)
Pt left v/m that her BP today was 197/98 earlier and 180/88 this afternoon. I called and pt was not at home but spoke with pts husband and he said pt had been out of benicar for ? # of days. Pt was getting benicar from pharmacy today and would take med and monitor BP. Pt not having CP, SOB, H/A or dizziness. Pt will cb to discuss further and can speak with CAN if after 5 when pt gets home.

## 2012-12-09 NOTE — Telephone Encounter (Signed)
Please check in with her to see how bp is now-thanks

## 2012-12-10 NOTE — Telephone Encounter (Signed)
Pt said since starting back on the benicar her BP has been a lot better, it's still a little high but not nearly as high as it was on Friday, pt said she has been checking it at home TID and if it goes back up like it did before she will call and schedule a f/u appt with Dr. Milinda Antis but she said it's okay for now

## 2012-12-25 ENCOUNTER — Encounter: Payer: Self-pay | Admitting: Family Medicine

## 2012-12-25 ENCOUNTER — Ambulatory Visit (INDEPENDENT_AMBULATORY_CARE_PROVIDER_SITE_OTHER): Payer: Medicare Other | Admitting: Family Medicine

## 2012-12-25 ENCOUNTER — Ambulatory Visit (INDEPENDENT_AMBULATORY_CARE_PROVIDER_SITE_OTHER)
Admission: RE | Admit: 2012-12-25 | Discharge: 2012-12-25 | Disposition: A | Discharge status: Home / Self Care | Payer: Medicare Other | Source: Ambulatory Visit | Attending: Family Medicine | Admitting: Family Medicine

## 2012-12-25 VITALS — BP 102/58 | HR 62 | Temp 98.1°F | Ht 63.0 in | Wt 142.5 lb

## 2012-12-25 DIAGNOSIS — R0789 Other chest pain: Secondary | ICD-10-CM | POA: Insufficient documentation

## 2012-12-25 DIAGNOSIS — R071 Chest pain on breathing: Secondary | ICD-10-CM

## 2012-12-25 DIAGNOSIS — J019 Acute sinusitis, unspecified: Secondary | ICD-10-CM | POA: Insufficient documentation

## 2012-12-25 DIAGNOSIS — K219 Gastro-esophageal reflux disease without esophagitis: Secondary | ICD-10-CM

## 2012-12-25 MED ORDER — AMOXICILLIN-POT CLAVULANATE 875-125 MG PO TABS: 1.0000 | ORAL_TABLET | Freq: Two times a day (BID) | ORAL | Status: DC

## 2012-12-25 MED ORDER — RANITIDINE HCL 150 MG PO CAPS: 150.0000 mg | ORAL_CAPSULE | Freq: Two times a day (BID) | ORAL | Status: DC

## 2012-12-25 NOTE — Patient Instructions (Signed)
Stop advil or aleve Take tylenol 2 pills up to every 4 hours as needed for pain/ aches and fever  Drink fluids  Take augmentin for sinus infection  Take zantac to settle down stomach acid  Use heat on your back to relax the muscles Chest xray today Update me if worse or not starting to improve in the next week

## 2012-12-25 NOTE — Progress Notes (Signed)
Pre-visit discussion using our clinic review tool. No additional management support is needed unless otherwise documented below in the visit note.  

## 2012-12-25 NOTE — Progress Notes (Signed)
Subjective:    Patient ID: Dawn Aguirre, female    DOB: Apr 23, 1925, 79 y.o.   MRN: [id number]  HPI Here for uri symptoms  Going on for some time - started with R ear pain Her son was sick also with uri /bronchitis   Stuffy nose / mild st/ tight cough  Very achey - and exhaustion  Has not had a fever - but hands and feet are cold / ? Some chills  ? If her RA is acting up -no swelling in her joints that is new  Non productive  Mucous from head is bright yellow  Some facial pain - ? Perhaps a sinus infection   R shoulder blade area is hurting and legs are generally sore  Making it difficult for her to move around  Just a few days   Has lost 9 lb- has lost her appetitie during all this  Nausea  after drinking water (can eat ok)  She has not been on anything for acid reflux   Has been very busy entertaining large groups of people and has a crowd coming for thanksgiving Takes full time care of her son with Down's Syndrome and her husband is over 90  Much stress to keep going    Patient Active Problem List   Diagnosis Date Noted  . Benign paroxysmal positional vertigo 09/14/2012  . Low back pain 09/07/2012  . Abnormal urinalysis 09/07/2012  . Constipation 11/04/2011  . UTI (lower urinary tract infection) 06/27/2011  . Aortic stenosis 08/31/2010  . Hyperlipidemia   . GASTRITIS 04/01/2009  . ABDOMINAL BLOATING 04/01/2009  . STRESS REACTION, ACUTE, WITH EMOTIONAL DISTURBANCE 10/12/2007  . DISC DISEASE, CERVICAL 10/12/2007  . HYPOTHYROIDISM 01/22/2007  . RAYNAUD'S SYNDROME 01/22/2007  . EXTERNAL HEMORRHOIDS 01/22/2007  . COPD 01/22/2007  . DIVERTICULOSIS, COLON 01/22/2007  . FIBROCYSTIC BREAST DISEASE 01/22/2007  . OSTEOPOROSIS 01/22/2007  . HYPERLIPIDEMIA 12/16/2006  . HYPERTENSION 12/16/2006  . GERD 12/16/2006  . RHEUMATOID ARTHRITIS 12/16/2006  . DYSPNEA ON EXERTION 12/16/2006  . COUGH, CHRONIC 12/16/2006  . TOBACCO ABUSE, HX OF 12/16/2006  . HYSTERECTOMY, HX OF  12/16/2006   Past Medical History  Diagnosis Date  . Flatulence, eructation, and gas pain   . Chronic airway obstruction, not elsewhere classified   . Cough   . Degeneration of cervical intervertebral disc   . Diverticulosis of colon (without mention of hemorrhage)   . Other dyspnea and respiratory abnormality   . External hemorrhoids without mention of complication   . Diffuse cystic mastopathy   . Unspecified gastritis and gastroduodenitis without mention of hemorrhage   . Esophageal reflux   . Other and unspecified hyperlipidemia   . Unspecified essential hypertension   . Unspecified hypothyroidism   . Acquired absence of organ, genital organs   . Osteoporosis, unspecified   . Other acute sinusitis   . Raynaud's syndrome   . Rheumatoid arthritis(714.0)     Sero-negative  . Predominant disturbance of emotions   . Personal history of tobacco use, presenting hazards to health   . Lichen sclerosus     back/abd/vulvar  . Spinal stenosis   . Colitis, ischemic 07/2000    ? infect  . Mastoiditis of right side 04/2005    admitted  . Fibula fracture 2006  . Hyperlipidemia    Past Surgical History  Procedure Laterality Date  . Abdominal hysterectomy  1968  . Dexa  2001    OP,heel  . Cardiollite  02/2002    Neg  .  Nasal sinus surgery  05/2002  . Dexa      OP hip T-2.94  . US echocardiography  10/06    normal LVF EF 55-65%  . Nuclear stress test  11/07    Normal   History  Substance Use Topics  . Smoking status: Former Smoker    Quit date: 02/07/1962  . Smokeless tobacco: Not on file  . Alcohol Use: No   Family History  Problem Relation Age of Onset  . Heart attack Father 71  . Coronary artery disease Sister 52    CABG  . Heart attack Brother 55  . Coronary artery disease      GF   Allergies  Allergen Reactions  . Ace Inhibitors     REACTION: cough  . Ezetimibe     REACTION: myalgia  . Ezetimibe-Simvastatin     REACTION: joints swell  . Moxifloxacin   .  Rosuvastatin     REACTION: myalgia  . Septra [Sulfamethoxazole-Trimethoprim]     Sick feeling   . Simvastatin     REACTION: myalgia  . Triaminic    Current Outpatient Prescriptions on File Prior to Visit  Medication Sig Dispense Refill  . amLODipine (NORVASC) 5 MG tablet Take 1 tablet (5 mg total) by mouth daily.  30 tablet  5  . atorvastatin (LIPITOR) 40 MG tablet Take 1 tablet (40 mg total) by mouth daily. With low fat snack  90 tablet  3  . BENICAR HCT 40-25 MG per tablet TAKE 1 TABLET BY MOUTH EVERY DAY  30 tablet  0  . cholecalciferol (VITAMIN D) 1000 UNITS tablet Take 1,000 Units by mouth daily.        . cyclobenzaprine (FLEXERIL) 10 MG tablet Take 1 tablet (10 mg total) by mouth 3 (three) times daily as needed for muscle spasms.  30 tablet  1  . levothyroxine (SYNTHROID, LEVOTHROID) 112 MCG tablet TAKE 1 TABLET BY MOUTH DAILY  90 tablet  1   No current facility-administered medications on file prior to visit.     Review of Systems Review of Systems  Constitutional: Negative for fever, pos for chills and pos for wt loss with decreased appetite and fatigue/malaise  ENT pos for cong with ear pain and ST and sinus pain  Eyes: Negative for pain and visual disturbance.  Respiratory: Negative for wheeze  and shortness of breath.   Cardiovascular: Negative for cp or palpitations    Gastrointestinal: Negative for diarrhea or blood in stool, pos for constipation and nausea when she drinks water  Genitourinary: Negative for urgency and frequency.  Skin: Negative for pallor or rash   MSK pos for muscle aches  Neurological: Negative for weakness, light-headedness, numbness and headaches.  Hematological: Negative for adenopathy. Does not bruise/bleed easily.  Psychiatric/Behavioral: Negative for dysphoric mood. The patient is not nervous/anxious.  pos for stressors        Objective:   Physical Exam  Constitutional: She appears well-developed and well-nourished. No distress.  HENT:   Head: Normocephalic and atraumatic.  Right Ear: External ear normal.  Left Ear: External ear normal.  Nares are injected and congested  bilat mild maxillary sinus tenderness TMs dull  Clear post nasal drip   Eyes: Conjunctivae and EOM are normal. Pupils are equal, round, and reactive to light. Right eye exhibits no discharge. Left eye exhibits no discharge. No scleral icterus.  Neck: Normal range of motion. Neck supple. No JVD present. Carotid bruit is not present. No thyromegaly present.  Cardiovascular: Normal  rate, regular rhythm and intact distal pulses.  Exam reveals no gallop.   Murmur heard. Pulmonary/Chest: Effort normal and breath sounds normal. No respiratory distress. She has no wheezes. She has no rales. She exhibits tenderness.  Harsh /distant bs No wheeze or rhonchi Ant chest wall is mildly tender without crepitus or skin changes   Abdominal: Soft. Bowel sounds are normal. She exhibits no distension and no mass. There is no tenderness. There is no rebound and no guarding.  Musculoskeletal: She exhibits tenderness. She exhibits no edema.  No acute joint change  Tender medial to L scapula - muscular/ back  No spinous process tenderness Nl rom   Lymphadenopathy:    She has no cervical adenopathy.  Neurological: She is alert. She has normal reflexes. No cranial nerve deficit. She exhibits normal muscle tone. Coordination normal.  Skin: Skin is warm and dry. No rash noted. No erythema. No pallor.  Psychiatric: She has a normal mood and affect.          Assessment & Plan:

## 2012-12-25 NOTE — Assessment & Plan Note (Signed)
With some cough/ uri symptoms and hx of copd  Also L sided back pain  cxr today

## 2012-12-25 NOTE — Assessment & Plan Note (Signed)
I feel this and some nsaid induced gastritis may have inc nausea lately inst to stop nsaids and use tylenol instead  Start ranitidine 150 bid and update if no imp  Consider EGD if water induced nausea/dysphasia continue

## 2012-12-25 NOTE — Assessment & Plan Note (Signed)
With uri symptoms/ body aches  tx with augmentin  Disc symptomatic care - see instructions on AVS  Update if not starting to improve in a week or if worsening   Also cxr today

## 2013-02-04 ENCOUNTER — Ambulatory Visit (INDEPENDENT_AMBULATORY_CARE_PROVIDER_SITE_OTHER): Payer: Medicare Other | Admitting: Family Medicine

## 2013-02-04 ENCOUNTER — Ambulatory Visit: Payer: Medicare Other | Admitting: Family Medicine

## 2013-02-04 ENCOUNTER — Telehealth: Payer: Self-pay

## 2013-02-04 ENCOUNTER — Encounter: Payer: Self-pay | Admitting: Family Medicine

## 2013-02-04 VITALS — BP 148/70 | HR 93 | Temp 98.0°F | Ht 63.0 in | Wt 152.5 lb

## 2013-02-04 DIAGNOSIS — M545 Low back pain, unspecified: Secondary | ICD-10-CM

## 2013-02-04 DIAGNOSIS — R682 Dry mouth, unspecified: Secondary | ICD-10-CM | POA: Insufficient documentation

## 2013-02-04 DIAGNOSIS — K117 Disturbances of salivary secretion: Secondary | ICD-10-CM

## 2013-02-04 LAB — POCT URINALYSIS DIPSTICK
Bilirubin, UA: NEGATIVE
Glucose, UA: NEGATIVE
Ketones, UA: NEGATIVE
Nitrite, UA: NEGATIVE
Protein, UA: NEGATIVE
Spec Grav, UA: <=1.005
Urobilinogen, UA: 0.2
pH, UA: 6

## 2013-02-04 MED ORDER — CYCLOBENZAPRINE HCL 10 MG PO TABS: 10.0000 mg | ORAL_TABLET | Freq: Three times a day (TID) | ORAL | Status: DC | PRN

## 2013-02-04 MED ORDER — OLMESARTAN MEDOXOMIL-HCTZ 40-25 MG PO TABS: 1.0000 | ORAL_TABLET | Freq: Every day | ORAL | Status: DC

## 2013-02-04 NOTE — Assessment & Plan Note (Signed)
In light of known autoimmune dz - want to r/o poss sjogrens Will refer back to her rheumatologist for a visit  Disc good fluid intake

## 2013-02-04 NOTE — Patient Instructions (Signed)
For your back -take flexeril/ cyclobenzaprine each night  I will do a referral to get you in with rheumatology earlier - because of back pain and dry mouth  Use heat on back when you can  Also tylenol is ok as directed

## 2013-02-04 NOTE — Progress Notes (Signed)
Subjective:    Patient ID: Dawn Aguirre, female    DOB: 1925/02/12, 79 y.o.   MRN: [id number]  HPI Here with back pain - last night it became severe again - both sides of low back  Could not get comfortable  Difficult to get out of bed  She took flexeril  Was out of nsaid - but took that several days ago  She has tramadol - but that makes her dizzy  PT was recommended -she had no time to go   Not sleeping from stressors and pain  Takes full time care of son and husband and runs a household at 81  She has hx of LS spinal stenosis and also trochanteric bursitis    No urinary symptoms She stays constipated - takes miralax for this  Has bladder prolapse  Relieving constipation does not help her back   Also having some bad dryness of throat - has woken up "gasping" for air  Not a sore throat  She has had to use an old rescue inhaler and advair  Also has to drink something - water - and she also vomited once (water) Nothing tastes right  Also very dry mouth  ? If dry eyes  Next rheumatologist visit is in March No hx of sjogrens in the past   Has had a very busy holiday season - then became exhausted  Not a lot of time to take care of herself   Patient Active Problem List   Diagnosis Date Noted  . Dry mouth 02/04/2013  . Acute sinusitis 12/25/2012  . Chest wall pain 12/25/2012  . Benign paroxysmal positional vertigo 09/14/2012  . Low back pain 09/07/2012  . Abnormal urinalysis 09/07/2012  . Constipation 11/04/2011  . UTI (lower urinary tract infection) 06/27/2011  . Aortic stenosis 08/31/2010  . Hyperlipidemia   . GASTRITIS 04/01/2009  . ABDOMINAL BLOATING 04/01/2009  . STRESS REACTION, ACUTE, WITH EMOTIONAL DISTURBANCE 10/12/2007  . DISC DISEASE, CERVICAL 10/12/2007  . HYPOTHYROIDISM 01/22/2007  . RAYNAUD'S SYNDROME 01/22/2007  . EXTERNAL HEMORRHOIDS 01/22/2007  . COPD 01/22/2007  . DIVERTICULOSIS, COLON 01/22/2007  . FIBROCYSTIC BREAST DISEASE 01/22/2007  .  OSTEOPOROSIS 01/22/2007  . HYPERLIPIDEMIA 12/16/2006  . HYPERTENSION 12/16/2006  . GERD 12/16/2006  . RHEUMATOID ARTHRITIS 12/16/2006  . DYSPNEA ON EXERTION 12/16/2006  . COUGH, CHRONIC 12/16/2006  . TOBACCO ABUSE, HX OF 12/16/2006  . HYSTERECTOMY, HX OF 12/16/2006   Past Medical History  Diagnosis Date  . Flatulence, eructation, and gas pain   . Chronic airway obstruction, not elsewhere classified   . Cough   . Degeneration of cervical intervertebral disc   . Diverticulosis of colon (without mention of hemorrhage)   . Other dyspnea and respiratory abnormality   . External hemorrhoids without mention of complication   . Diffuse cystic mastopathy   . Unspecified gastritis and gastroduodenitis without mention of hemorrhage   . Esophageal reflux   . Other and unspecified hyperlipidemia   . Unspecified essential hypertension   . Unspecified hypothyroidism   . Acquired absence of organ, genital organs   . Osteoporosis, unspecified   . Other acute sinusitis   . Raynaud's syndrome   . Rheumatoid arthritis(714.0)     Sero-negative  . Predominant disturbance of emotions   . Personal history of tobacco use, presenting hazards to health   . Lichen sclerosus     back/abd/vulvar  . Spinal stenosis   . Colitis, ischemic 07/2000    ? infect  . Mastoiditis of right  side 04/2005    admitted  . Fibula fracture 2006  . Hyperlipidemia    Past Surgical History  Procedure Laterality Date  . Abdominal hysterectomy  1968  . Dexa  2001    OP,heel  . Cardiollite  02/2002    Neg  . Nasal sinus surgery  05/2002  . Dexa      OP hip T-2.94  . US echocardiography  10/06    normal LVF EF 55-65%  . Nuclear stress test  11/07    Normal   History  Substance Use Topics  . Smoking status: Former Smoker    Quit date: 02/07/1962  . Smokeless tobacco: Not on file  . Alcohol Use: No   Family History  Problem Relation Age of Onset  . Heart attack Father 91  . Coronary artery disease Sister 59      CABG  . Heart attack Brother 55  . Coronary artery disease      GF   Allergies  Allergen Reactions  . Ace Inhibitors     REACTION: cough  . Ezetimibe     REACTION: myalgia  . Ezetimibe-Simvastatin     REACTION: joints swell  . Moxifloxacin   . Rosuvastatin     REACTION: myalgia  . Septra [Sulfamethoxazole-Trimethoprim]     Sick feeling   . Simvastatin     REACTION: myalgia  . Triaminic    Current Outpatient Prescriptions on File Prior to Visit  Medication Sig Dispense Refill  . amLODipine (NORVASC) 5 MG tablet Take 1 tablet (5 mg total) by mouth daily.  30 tablet  5  . atorvastatin (LIPITOR) 40 MG tablet Take 1 tablet (40 mg total) by mouth daily. With low fat snack  90 tablet  3  . cholecalciferol (VITAMIN D) 1000 UNITS tablet Take 1,000 Units by mouth daily.        Marland Kitchen levothyroxine (SYNTHROID, LEVOTHROID) 112 MCG tablet TAKE 1 TABLET BY MOUTH DAILY  90 tablet  1  . ranitidine (ZANTAC) 150 MG capsule Take 1 capsule (150 mg total) by mouth 2 (two) times daily.  60 capsule  3   No current facility-administered medications on file prior to visit.    Review of Systems Review of Systems  Constitutional: Negative for fever, appetite change,  and unexpected weight change. pos for extreme fatigue ENT pos for severely dry mouth and throat with occ choking sensation Eyes: Negative for pain and visual disturbance.  Respiratory: Negative for wheeze  and shortness of breath.   Cardiovascular: Negative for cp or palpitations    Gastrointestinal: Negative for nausea, diarrhea and constipation.  Genitourinary: Negative for urgency and frequency.  Skin: Negative for pallor or rash   MSK pos for joint pain baseline/ pos for low back pain that is worse lately  Neurological: Negative for weakness, light-headedness, numbness and headaches.  Hematological: Negative for adenopathy. Does not bruise/bleed easily.  Psychiatric/Behavioral: Negative for dysphoric mood. The patient is not  nervous/anxious.         Objective:   Physical Exam        Assessment & Plan:

## 2013-02-04 NOTE — Progress Notes (Signed)
Pre-visit discussion using our clinic review tool. No additional management support is needed unless otherwise documented below in the visit note.  

## 2013-02-04 NOTE — Assessment & Plan Note (Signed)
This is worse than usual - esp last pm  Known spinal stenosis  Has not had time to go to PT  Will return to rheumatologist Disc poss of epidural inj She will take flexeril with caution at bedtime to control spasm as well as heat

## 2013-02-04 NOTE — Telephone Encounter (Signed)
Aware I will see her then 

## 2013-02-04 NOTE — Telephone Encounter (Signed)
Pt said has had back pain since Thanksgiving but last night low back pain on both side worsened; pt cannot get comfortable sitting, laying or walking. Today pain level 9. Pt siad also having extremely dry throat on and off for a while, does not feel like swelling and pt can breathe OK. Pt scheduled appt today with Dr Milinda Antis due to back pain.

## 2013-02-05 ENCOUNTER — Ambulatory Visit: Payer: Medicare Other | Admitting: Family Medicine

## 2013-02-17 ENCOUNTER — Emergency Department (HOSPITAL_COMMUNITY)
Admission: EM | Admit: 2013-02-17 | Discharge: 2013-02-17 | Disposition: A | Discharge status: Home / Self Care | Payer: Medicare Other | Attending: Emergency Medicine | Admitting: Emergency Medicine

## 2013-02-17 ENCOUNTER — Encounter (HOSPITAL_COMMUNITY): Payer: Self-pay | Admitting: Emergency Medicine

## 2013-02-17 ENCOUNTER — Emergency Department (HOSPITAL_COMMUNITY): Payer: Medicare Other

## 2013-02-17 DIAGNOSIS — K2990 Gastroduodenitis, unspecified, without bleeding: Secondary | ICD-10-CM

## 2013-02-17 DIAGNOSIS — I10 Essential (primary) hypertension: Secondary | ICD-10-CM | POA: Insufficient documentation

## 2013-02-17 DIAGNOSIS — Z9079 Acquired absence of other genital organ(s): Secondary | ICD-10-CM | POA: Insufficient documentation

## 2013-02-17 DIAGNOSIS — D696 Thrombocytopenia, unspecified: Secondary | ICD-10-CM | POA: Insufficient documentation

## 2013-02-17 DIAGNOSIS — M810 Age-related osteoporosis without current pathological fracture: Secondary | ICD-10-CM | POA: Insufficient documentation

## 2013-02-17 DIAGNOSIS — L940 Localized scleroderma [morphea]: Secondary | ICD-10-CM | POA: Insufficient documentation

## 2013-02-17 DIAGNOSIS — E039 Hypothyroidism, unspecified: Secondary | ICD-10-CM | POA: Insufficient documentation

## 2013-02-17 DIAGNOSIS — K5730 Diverticulosis of large intestine without perforation or abscess without bleeding: Secondary | ICD-10-CM | POA: Insufficient documentation

## 2013-02-17 DIAGNOSIS — N390 Urinary tract infection, site not specified: Secondary | ICD-10-CM | POA: Insufficient documentation

## 2013-02-17 DIAGNOSIS — K219 Gastro-esophageal reflux disease without esophagitis: Secondary | ICD-10-CM | POA: Insufficient documentation

## 2013-02-17 DIAGNOSIS — Z87891 Personal history of nicotine dependence: Secondary | ICD-10-CM | POA: Insufficient documentation

## 2013-02-17 DIAGNOSIS — I7300 Raynaud's syndrome without gangrene: Secondary | ICD-10-CM | POA: Insufficient documentation

## 2013-02-17 DIAGNOSIS — K644 Residual hemorrhoidal skin tags: Secondary | ICD-10-CM | POA: Insufficient documentation

## 2013-02-17 DIAGNOSIS — K2970 Gastritis, unspecified, without bleeding: Secondary | ICD-10-CM | POA: Insufficient documentation

## 2013-02-17 DIAGNOSIS — E785 Hyperlipidemia, unspecified: Secondary | ICD-10-CM | POA: Insufficient documentation

## 2013-02-17 DIAGNOSIS — M069 Rheumatoid arthritis, unspecified: Secondary | ICD-10-CM | POA: Insufficient documentation

## 2013-02-17 DIAGNOSIS — K5900 Constipation, unspecified: Secondary | ICD-10-CM | POA: Insufficient documentation

## 2013-02-17 DIAGNOSIS — N6019 Diffuse cystic mastopathy of unspecified breast: Secondary | ICD-10-CM | POA: Insufficient documentation

## 2013-02-17 LAB — COMPREHENSIVE METABOLIC PANEL
ALT: 21 U/L (ref 0–35)
AST: 20 U/L (ref 0–37)
Albumin: 3.9 g/dL (ref 3.5–5.2)
Alkaline Phosphatase: 66 U/L (ref 39–117)
BUN: 13 mg/dL (ref 6–23)
CO2: 28 mEq/L (ref 19–32)
Calcium: 9.6 mg/dL (ref 8.4–10.5)
Chloride: 100 mEq/L (ref 96–112)
Creatinine, Ser: 0.97 mg/dL (ref 0.50–1.10)
GFR calc Af Amer: 60 mL/min — ABNORMAL LOW (ref >90)
GFR calc non Af Amer: 52 mL/min — ABNORMAL LOW (ref >90)
Glucose, Bld: 111 mg/dL — ABNORMAL HIGH (ref 70–99)
Potassium: 4 mEq/L (ref 3.7–5.3)
Sodium: 138 mEq/L (ref 137–147)
Total Bilirubin: 0.4 mg/dL (ref 0.3–1.2)
Total Protein: 7.6 g/dL (ref 6.0–8.3)

## 2013-02-17 LAB — CBC WITH DIFFERENTIAL/PLATELET
Basophils Absolute: 0 10*3/uL (ref 0.0–0.1)
Basophils Relative: 0 % (ref 0–1)
Eosinophils Absolute: 0.1 10*3/uL (ref 0.0–0.7)
Eosinophils Relative: 1 % (ref 0–5)
HCT: 39.6 % (ref 36.0–46.0)
Hemoglobin: 13.2 g/dL (ref 12.0–15.0)
Lymphocytes Relative: 19 % (ref 12–46)
Lymphs Abs: 1.2 10*3/uL (ref 0.7–4.0)
MCH: 31.4 pg (ref 26.0–34.0)
MCHC: 33.3 g/dL (ref 30.0–36.0)
MCV: 94.1 fL (ref 78.0–100.0)
Monocytes Absolute: 0.6 10*3/uL (ref 0.1–1.0)
Monocytes Relative: 10 % (ref 3–12)
Neutro Abs: 4.3 10*3/uL (ref 1.7–7.7)
Neutrophils Relative %: 69 % (ref 43–77)
Platelets: 80 10*3/uL — ABNORMAL LOW (ref 150–400)
RBC: 4.21 MIL/uL (ref 3.87–5.11)
RDW: 13.2 % (ref 11.5–15.5)
WBC: 6.2 10*3/uL (ref 4.0–10.5)

## 2013-02-17 LAB — URINALYSIS, ROUTINE W REFLEX MICROSCOPIC
Bilirubin Urine: NEGATIVE
Glucose, UA: NEGATIVE mg/dL
Ketones, ur: NEGATIVE mg/dL
Nitrite: NEGATIVE
Protein, ur: NEGATIVE mg/dL
Specific Gravity, Urine: 1.011 (ref 1.005–1.030)
Urobilinogen, UA: 0.2 mg/dL (ref 0.0–1.0)
pH: 5 (ref 5.0–8.0)

## 2013-02-17 LAB — URINE MICROSCOPIC-ADD ON

## 2013-02-17 MED ORDER — IOHEXOL 300 MG/ML  SOLN
20.0000 mL | INTRAMUSCULAR | Status: AC
  Administered 2013-02-17: 20 mL via ORAL

## 2013-02-17 MED ORDER — MORPHINE SULFATE 4 MG/ML IJ SOLN
4.0000 mg | Freq: Once | INTRAMUSCULAR | Status: AC
  Administered 2013-02-17: 4 mg via INTRAVENOUS
  Filled 2013-02-17: qty 1

## 2013-02-17 MED ORDER — ONDANSETRON HCL 4 MG/2ML IJ SOLN
4.0000 mg | Freq: Once | INTRAMUSCULAR | Status: AC
  Administered 2013-02-17: 4 mg via INTRAVENOUS
  Filled 2013-02-17: qty 2

## 2013-02-17 MED ORDER — PEG 3350-KCL-NABCB-NACL-NASULF 236 G PO SOLR: 4000.0000 mL | Freq: Once | ORAL | Status: DC

## 2013-02-17 MED ORDER — IOHEXOL 300 MG/ML  SOLN
100.0000 mL | Freq: Once | INTRAMUSCULAR | Status: AC | PRN
  Administered 2013-02-17: 100 mL via INTRAVENOUS

## 2013-02-17 MED ORDER — CEPHALEXIN 500 MG PO CAPS: 500.0000 mg | ORAL_CAPSULE | Freq: Four times a day (QID) | ORAL | Status: DC

## 2013-02-17 MED ORDER — DEXTROSE 5 % IV SOLN
1.0000 g | INTRAVENOUS | Status: DC
  Administered 2013-02-17: 1 g via INTRAVENOUS
  Filled 2013-02-17: qty 10

## 2013-02-17 MED ORDER — SODIUM CHLORIDE 0.9 % IV SOLN
INTRAVENOUS | Status: DC
  Administered 2013-02-17: 17:00:00 via INTRAVENOUS

## 2013-02-17 NOTE — Discharge Instructions (Signed)
Thrombocytopenia Thrombocytopenia is a condition in which there is an abnormally small number of platelets in your blood. Platelets are also called thrombocytes. Platelets are needed for blood clotting. CAUSES Thrombocytopenia is caused by:   Decreased production of platelets. This can be caused by:  Aplastic anemia in which your bone marrow quits making blood cells.  Cancer in the bone marrow.  Use of certain medicines, including chemotherapy.  Infection in the bone marrow.  Heavy alcohol consumption.  Increased destruction of platelets. This can be caused by:  Certain immune diseases.  Use of certain drugs.  Certain blood clotting disorders.  Certain inherited disorders.  Certain bleeding disorders.  Pregnancy.  Having an enlarged spleen (hypersplenism). In hypersplenism, the spleen gathers up platelets from circulation. This means the platelets are not available to help with blood clotting. The spleen can enlarge due to cirrhosis or other conditions. SYMPTOMS  The symptoms of thrombocytopenia are side effects of poor blood clotting. Some of these are:  Abnormal bleeding.  Nosebleeds.  Heavy menstrual periods.  Blood in the urine or stools.  Purpura. This is a purplish discoloration in the skin produced by small bleeding vessels near the surface of the skin.  Bruising.  A rash that may be petechial. This looks like pinpoint, purplish-red spots on the skin and mucous membranes. It is caused by bleeding from small blood vessels (capillaries). DIAGNOSIS  Your caregiver will make this diagnosis based on your exam and blood tests. Sometimes, a bone marrow study is done to look for the original cells (megakaryocytes) that make platelets. TREATMENT  Treatment depends on the cause of the condition.  Medicines may be given to help protect your platelets from being destroyed.  In some cases, a replacement (transfusion) of platelets may be required to stop or prevent  bleeding.  Sometimes, the spleen must be surgically removed. HOME CARE INSTRUCTIONS   Check the skin and linings inside your mouth for bruising or bleeding as directed by your caregiver.  Check your sputum, urine, and stool for blood as directed by your caregiver.  Do not return to any activities that could cause bumps or bruises until your caregiver says it is okay.  Take extra care not to cut yourself when shaving or when using scissors, needles, knives, and other tools.  Take extra care not to burn yourself when ironing or cooking.  Ask your caregiver if it is okay for you to drink alcohol.  Only take over-the-counter or prescription medicines as directed by your caregiver.  Notify all your caregivers, including dentists and eye doctors, about your condition. SEEK IMMEDIATE MEDICAL CARE IF:   You develop active bleeding from anywhere in your body.  You develop unexplained bruising or bleeding.  You have blood in your sputum, urine, or stool. MAKE SURE YOU:  Understand these instructions.  Will watch your condition.  Will get help right away if you are not doing well or get worse. Document Released: 01/24/2005 Document Revised: 04/18/2011 Document Reviewed: 11/26/2010 [facility] Patient Information 2014 [place], [place]. Constipation, Adult Constipation is when a person has fewer than 3 bowel movements a week; has difficulty having a bowel movement; or has stools that are dry, hard, or larger than normal. As people grow older, constipation is more common. If you try to fix constipation with medicines that make you have a bowel movement (laxatives), the problem may get worse. Long-term laxative use may cause the muscles of the colon to become weak. A low-fiber diet, not taking in enough fluids, and  taking certain medicines may make constipation worse. CAUSES   Certain medicines, such as antidepressants, pain medicine, iron supplements, antacids, and water pills.   Certain  diseases, such as diabetes, irritable bowel syndrome (IBS), thyroid disease, or depression.   Not drinking enough water.   Not eating enough fiber-rich foods.   Stress or travel.  Lack of physical activity or exercise.  Not going to the restroom when there is the urge to have a bowel movement.  Ignoring the urge to have a bowel movement.  Using laxatives too much. SYMPTOMS   Having fewer than 3 bowel movements a week.   Straining to have a bowel movement.   Having hard, dry, or larger than normal stools.   Feeling full or bloated.   Pain in the lower abdomen.  Not feeling relief after having a bowel movement. DIAGNOSIS  Your caregiver will take a medical history and perform a physical exam. Further testing may be done for severe constipation. Some tests may include:   A barium enema X-ray to examine your rectum, colon, and sometimes, your small intestine.  A sigmoidoscopy to examine your lower colon.  A colonoscopy to examine your entire colon. TREATMENT  Treatment will depend on the severity of your constipation and what is causing it. Some dietary treatments include drinking more fluids and eating more fiber-rich foods. Lifestyle treatments may include regular exercise. If these diet and lifestyle recommendations do not help, your caregiver may recommend taking over-the-counter laxative medicines to help you have bowel movements. Prescription medicines may be prescribed if over-the-counter medicines do not work.  HOME CARE INSTRUCTIONS   Increase dietary fiber in your diet, such as fruits, vegetables, whole grains, and beans. Limit high-fat and processed sugars in your diet, such as [place] fries, hamburgers, cookies, candies, and soda.   A fiber supplement may be added to your diet if you cannot get enough fiber from foods.   Drink enough fluids to keep your urine clear or pale yellow.   Exercise regularly or as directed by your caregiver.   Go to the  restroom when you have the urge to go. Do not hold it.  Only take medicines as directed by your caregiver. Do not take other medicines for constipation without talking to your caregiver first. [place] IF:   You have bright red blood in your stool.   Your constipation lasts for more than 4 days or gets worse.   You have abdominal or rectal pain.   You have thin, pencil-like stools.  You have unexplained weight loss. MAKE SURE YOU:   Understand these instructions.  Will watch your condition.  Will get help right away if you are not doing well or get worse. Document Released: 10/23/2003 Document Revised: 04/18/2011 Document Reviewed: 11/05/2012 [facility] Patient Information 2014 [place], [place]. Urinary Tract Infection Urinary tract infections (UTIs) can develop anywhere along your urinary tract. Your urinary tract is your body's drainage system for removing wastes and extra water. Your urinary tract includes two kidneys, two ureters, a bladder, and a urethra. Your kidneys are a pair of bean-shaped organs. Each kidney is about the size of your fist. They are located below your ribs, one on each side of your spine. CAUSES Infections are caused by microbes, which are microscopic organisms, including fungi, viruses, and bacteria. These organisms are so small that they can only be seen through a microscope. Bacteria are the microbes that most commonly cause UTIs. SYMPTOMS  Symptoms of UTIs may vary by age  and gender of the patient and by the location of the infection. Symptoms in young women typically include a frequent and intense urge to urinate and a painful, burning feeling in the bladder or urethra during urination. Older women and men are more likely to be tired, shaky, and weak and have muscle aches and abdominal pain. A fever may mean the infection is in your kidneys. Other symptoms of a kidney infection include pain in your back or sides below the ribs, nausea, and  vomiting. DIAGNOSIS To diagnose a UTI, your caregiver will ask you about your symptoms. Your caregiver also will ask to provide a urine sample. The urine sample will be tested for bacteria and white blood cells. White blood cells are made by your body to help fight infection. TREATMENT  Typically, UTIs can be treated with medication. Because most UTIs are caused by a bacterial infection, they usually can be treated with the use of antibiotics. The choice of antibiotic and length of treatment depend on your symptoms and the type of bacteria causing your infection. HOME CARE INSTRUCTIONS  If you were prescribed antibiotics, take them exactly as your caregiver instructs you. Finish the medication even if you feel better after you have only taken some of the medication.  Drink enough water and fluids to keep your urine clear or pale yellow.  Avoid caffeine, tea, and carbonated beverages. They tend to irritate your bladder.  Empty your bladder often. Avoid holding urine for long periods of time.  Empty your bladder before and after sexual intercourse.  After a bowel movement, women should cleanse from front to back. Use each tissue only once. SEEK MEDICAL CARE IF:   You have back pain.  You develop a fever.  Your symptoms do not begin to resolve within 3 days. SEEK IMMEDIATE MEDICAL CARE IF:   You have severe back pain or lower abdominal pain.  You develop chills.  You have nausea or vomiting.  You have continued burning or discomfort with urination. MAKE SURE YOU:   Understand these instructions.  Will watch your condition.  Will get help right away if you are not doing well or get worse. Document Released: 11/03/2004 Document Revised: 07/26/2011 Document Reviewed: 03/04/2011 [facility] Patient Information 2014 [place].

## 2013-02-17 NOTE — ED Notes (Signed)
Pt returned from CT °

## 2013-02-17 NOTE — ED Provider Notes (Signed)
CSN: [id number]     Arrival date & time 02/17/13  1223 History   First MD Initiated Contact with Patient 02/17/13 1656     Chief Complaint  Patient presents with  . Abdominal Pain   (Consider location/radiation/quality/duration/timing/severity/associated sxs/prior Treatment) Patient is a 80 y.o. female presenting with abdominal pain. The history is provided by the patient and a relative.  Abdominal Pain  patient here complaining of left lower quadrant pain x3 days. She has a history diverticulitis. Pain characterized as sharp and worse with certain positions. Denies any black or bloody stools. No fever or chills. Denies any dysuria but has had some urinary frequency. No flank pain. No vomiting or diarrhea. Did use medications without relief. Nothing makes her symptoms better or  Past Medical History  Diagnosis Date  . Flatulence, eructation, and gas pain   . Chronic airway obstruction, not elsewhere classified   . Cough   . Degeneration of cervical intervertebral disc   . Diverticulosis of colon (without mention of hemorrhage)   . Other dyspnea and respiratory abnormality   . External hemorrhoids without mention of complication   . Diffuse cystic mastopathy   . Unspecified gastritis and gastroduodenitis without mention of hemorrhage   . Esophageal reflux   . Other and unspecified hyperlipidemia   . Unspecified essential hypertension   . Unspecified hypothyroidism   . Acquired absence of organ, genital organs   . Osteoporosis, unspecified   . Other acute sinusitis   . Raynaud's syndrome   . Rheumatoid arthritis(714.0)     Sero-negative  . Predominant disturbance of emotions   . Personal history of tobacco use, presenting hazards to health   . Lichen sclerosus     back/abd/vulvar  . Spinal stenosis   . Colitis, ischemic 07/2000    ? infect  . Mastoiditis of right side 04/2005    admitted  . Fibula fracture 2006  . Hyperlipidemia    Past Surgical History  Procedure Laterality  Date  . Abdominal hysterectomy  1968  . Dexa  2001    OP,heel  . Cardiollite  02/2002    Neg  . Nasal sinus surgery  05/2002  . Dexa      OP hip T-2.94  . US echocardiography  10/06    normal LVF EF 55-65%  . Nuclear stress test  11/07    Normal   Family History  Problem Relation Age of Onset  . Heart attack Father 53  . Coronary artery disease Sister 39    CABG  . Heart attack Brother 55  . Coronary artery disease      GF   History  Substance Use Topics  . Smoking status: Former Smoker    Quit date: 02/07/1962  . Smokeless tobacco: Not on file  . Alcohol Use: No   OB History   Grav Para Term Preterm Abortions TAB SAB Ect Mult Living                 Review of Systems  Gastrointestinal: Positive for abdominal pain.  All other systems reviewed and are negative.    Allergies  Ace inhibitors; Ezetimibe; Ezetimibe-simvastatin; Moxifloxacin; Rosuvastatin; Septra; Simvastatin; and Triaminic  Home Medications   Current Outpatient Rx  Name  Route  Sig  Dispense  Refill  . amLODipine (NORVASC) 5 MG tablet   Oral   Take 1 tablet (5 mg total) by mouth daily.   30 tablet   5   . atorvastatin (LIPITOR) 40 MG tablet  Oral   Take 1 tablet (40 mg total) by mouth daily. With low fat snack   90 tablet   3   . cholecalciferol (VITAMIN D) 1000 UNITS tablet   Oral   Take 1,000 Units by mouth daily.           . cyclobenzaprine (FLEXERIL) 10 MG tablet   Oral   Take 1 tablet (10 mg total) by mouth 3 (three) times daily as needed for muscle spasms.   30 tablet   2   . levothyroxine (SYNTHROID, LEVOTHROID) 112 MCG tablet      TAKE 1 TABLET BY MOUTH DAILY   90 tablet   1   . olmesartan-hydrochlorothiazide (BENICAR HCT) 40-25 MG per tablet   Oral   Take 1 tablet by mouth daily.   90 tablet   3   . ranitidine (ZANTAC) 150 MG capsule   Oral   Take 1 capsule (150 mg total) by mouth 2 (two) times daily.   60 capsule   3    BP 181/75  Pulse 97  Temp(Src) 97.9  F (36.6 C) (Oral)  Resp 16  Wt 145 lb 12.8 oz (66.134 kg)  SpO2 99% Physical Exam  Nursing note and vitals reviewed. Constitutional: She is oriented to person, place, and time. She appears well-developed and well-nourished.  Non-toxic appearance. No distress.  HENT:  Head: Normocephalic and atraumatic.  Eyes: Conjunctivae, EOM and lids are normal. Pupils are equal, round, and reactive to light.  Neck: Normal range of motion. Neck supple. No tracheal deviation present. No mass present.  Cardiovascular: Normal rate, regular rhythm and normal heart sounds.  Exam reveals no gallop.   No murmur heard. Pulmonary/Chest: Effort normal and breath sounds normal. No stridor. No respiratory distress. She has no decreased breath sounds. She has no wheezes. She has no rhonchi. She has no rales.  Abdominal: Soft. Normal appearance and bowel sounds are normal. She exhibits no distension. There is tenderness in the left lower quadrant. There is no rigidity, no rebound, no guarding and no CVA tenderness.    Musculoskeletal: Normal range of motion. She exhibits no edema and no tenderness.  Neurological: She is alert and oriented to person, place, and time. She has normal strength. No cranial nerve deficit or sensory deficit. GCS eye subscore is 4. GCS verbal subscore is 5. GCS motor subscore is 6.  Skin: Skin is warm and dry. No abrasion and no rash noted.  Psychiatric: She has a normal mood and affect. Her speech is normal and behavior is normal.    ED Course  Procedures (including critical care time) Labs Review Labs Reviewed  COMPREHENSIVE METABOLIC PANEL - Abnormal; Notable for the following:    Glucose, Bld 111 (*)    GFR calc non Af Amer 52 (*)    GFR calc Af Amer 60 (*)    All other components within normal limits  CBC WITH DIFFERENTIAL - Abnormal; Notable for the following:    Platelets 80 (*)    All other components within normal limits  URINALYSIS, ROUTINE W REFLEX MICROSCOPIC - Abnormal;  Notable for the following:    APPearance CLOUDY (*)    Hgb urine dipstick MODERATE (*)    Leukocytes, UA LARGE (*)    All other components within normal limits  URINE MICROSCOPIC-ADD ON - Abnormal; Notable for the following:    Squamous Epithelial / LPF MANY (*)    Bacteria, UA MANY (*)    All other components within normal limits  URINE CULTURE   Imaging Review No results found.  EKG Interpretation   None       MDM  No diagnosis found. Patient had abdominal CT that showed constipation as well as epiploic appendigitis. Patient is also treated for urinary tract infection. Suspect her symptoms are from her constipation. She also has mild thrombocytopenia which she was instructed to followup with her Dr. for. She'll be given a prescription for GoLYTELY as well as for Keflex and she will see her Dr. in followup next week    Leota Jacobsen, MD 02/17/13 2001

## 2013-02-17 NOTE — ED Notes (Signed)
Pt is here with LLQ pain and history of diverticulitis.  resports constipation

## 2013-02-17 NOTE — ED Notes (Signed)
Patient transported to CT 

## 2013-02-18 LAB — URINE CULTURE
Colony Count: NO GROWTH
Culture: NO GROWTH

## 2013-02-22 ENCOUNTER — Encounter: Payer: Self-pay | Admitting: Family Medicine

## 2013-02-22 ENCOUNTER — Ambulatory Visit (INDEPENDENT_AMBULATORY_CARE_PROVIDER_SITE_OTHER): Payer: Medicare Other | Admitting: Family Medicine

## 2013-02-22 VITALS — BP 122/76 | HR 87 | Temp 98.4°F | Ht 63.0 in | Wt 146.0 lb

## 2013-02-22 DIAGNOSIS — K5289 Other specified noninfective gastroenteritis and colitis: Secondary | ICD-10-CM

## 2013-02-22 DIAGNOSIS — R1032 Left lower quadrant pain: Secondary | ICD-10-CM

## 2013-02-22 DIAGNOSIS — K529 Noninfective gastroenteritis and colitis, unspecified: Secondary | ICD-10-CM

## 2013-02-22 DIAGNOSIS — K6389 Other specified diseases of intestine: Secondary | ICD-10-CM

## 2013-02-22 DIAGNOSIS — N390 Urinary tract infection, site not specified: Secondary | ICD-10-CM

## 2013-02-22 DIAGNOSIS — D696 Thrombocytopenia, unspecified: Secondary | ICD-10-CM | POA: Insufficient documentation

## 2013-02-22 DIAGNOSIS — K5900 Constipation, unspecified: Secondary | ICD-10-CM

## 2013-02-22 LAB — CBC WITH DIFFERENTIAL/PLATELET
Basophils Absolute: 0 10*3/uL (ref 0.0–0.1)
Basophils Relative: 0.5 % (ref 0.0–3.0)
Eosinophils Absolute: 0.1 10*3/uL (ref 0.0–0.7)
Eosinophils Relative: 1.7 % (ref 0.0–5.0)
HCT: 38.1 % (ref 36.0–46.0)
Hemoglobin: 12.9 g/dL (ref 12.0–15.0)
Lymphocytes Relative: 24.2 % (ref 12.0–46.0)
Lymphs Abs: 1.4 10*3/uL (ref 0.7–4.0)
MCHC: 33.9 g/dL (ref 30.0–36.0)
MCV: 91.9 fl (ref 78.0–100.0)
Monocytes Absolute: 0.6 10*3/uL (ref 0.1–1.0)
Monocytes Relative: 9.9 % (ref 3.0–12.0)
Neutro Abs: 3.7 10*3/uL (ref 1.4–7.7)
Neutrophils Relative %: 63.7 % (ref 43.0–77.0)
Platelets: 91 10*3/uL — ABNORMAL LOW (ref 150.0–400.0)
RBC: 4.15 Mil/uL (ref 3.87–5.11)
RDW: 13.5 % (ref 11.5–14.6)
WBC: 5.8 10*3/uL (ref 4.5–10.5)

## 2013-02-22 MED ORDER — PEG 3350-KCL-NABCB-NACL-NASULF 236 G PO SOLR: 4000.0000 mL | Freq: Once | ORAL | Status: DC

## 2013-02-22 NOTE — Progress Notes (Signed)
Pre-visit discussion using our clinic review tool. No additional management support is needed unless otherwise documented below in the visit note.  

## 2013-02-22 NOTE — Progress Notes (Signed)
Subjective:    Patient ID: Dawn Aguirre, female    DOB: 1925-07-14, 80 y.o.   MRN: [id number]  HPI Here for f/u of L sided abd pain for which she went to the hospital 1/11   Dx with potential uti Urine cx came back neg   Lab Results  Component Value Date   WBC 6.2 02/17/2013   HGB 13.2 02/17/2013   HCT 39.6 02/17/2013   MCV 94.1 02/17/2013   PLT 80* 02/17/2013    CT of abd showed epiploic appendigitis  Constipation    Her pain is improved today - still some tenderness there  She can now at least touch that side and can sit more comfortable  Still bloating  She is worried about ileus   She had problems with L side pain in the past - saw Dr Carlean Purl -and was not happy  The camera broke during her colonoscopy also with him -she was very upset  Also not Dr Oletta Lamas either  She did like Dr Vennie Homans    She has taken the golytely they gave her - and it really cleaned her out on Monday - and this helped quite a bit   Patient Active Problem List   Diagnosis Date Noted  . Thrombocytopenia, unspecified 02/22/2013  . Epiploic appendagitis 02/22/2013  . Abdominal pain, left lower quadrant 02/22/2013  . Dry mouth 02/04/2013  . Acute sinusitis 12/25/2012  . Chest wall pain 12/25/2012  . Benign paroxysmal positional vertigo 09/14/2012  . Low back pain 09/07/2012  . Abnormal urinalysis 09/07/2012  . Constipation 11/04/2011  . UTI (lower urinary tract infection) 06/27/2011  . Aortic stenosis 08/31/2010  . Hyperlipidemia   . GASTRITIS 04/01/2009  . ABDOMINAL BLOATING 04/01/2009  . STRESS REACTION, ACUTE, WITH EMOTIONAL DISTURBANCE 10/12/2007  . [place] DISEASE, CERVICAL 10/12/2007  . HYPOTHYROIDISM 01/22/2007  . RAYNAUD'S SYNDROME 01/22/2007  . EXTERNAL HEMORRHOIDS 01/22/2007  . COPD 01/22/2007  . DIVERTICULOSIS, COLON 01/22/2007  . FIBROCYSTIC BREAST DISEASE 01/22/2007  . OSTEOPOROSIS 01/22/2007  . HYPERLIPIDEMIA 12/16/2006  . HYPERTENSION 12/16/2006  . GERD 12/16/2006  .  RHEUMATOID ARTHRITIS 12/16/2006  . DYSPNEA ON EXERTION 12/16/2006  . COUGH, CHRONIC 12/16/2006  . TOBACCO ABUSE, HX OF 12/16/2006  . HYSTERECTOMY, HX OF 12/16/2006   Past Medical History  Diagnosis Date  . Flatulence, eructation, and gas pain   . Chronic airway obstruction, not elsewhere classified   . Cough   . Degeneration of cervical intervertebral disc   . Diverticulosis of colon (without mention of hemorrhage)   . Other dyspnea and respiratory abnormality   . External hemorrhoids without mention of complication   . Diffuse cystic mastopathy   . Unspecified gastritis and gastroduodenitis without mention of hemorrhage   . Esophageal reflux   . Other and unspecified hyperlipidemia   . Unspecified essential hypertension   . Unspecified hypothyroidism   . Acquired absence of organ, genital organs   . Osteoporosis, unspecified   . Other acute sinusitis   . Raynaud's syndrome   . Rheumatoid arthritis(714.0)     Sero-negative  . Predominant disturbance of emotions   . Personal history of tobacco use, presenting hazards to health   . Lichen sclerosus     back/abd/vulvar  . Spinal stenosis   . Colitis, ischemic 07/2000    ? infect  . Mastoiditis of right side 04/2005    admitted  . Fibula fracture 2006  . Hyperlipidemia    Past Surgical History  Procedure Laterality Date  .  Abdominal hysterectomy  1968  . Dexa  2001    OP,heel  . Cardiollite  02/2002    Neg  . Nasal sinus surgery  05/2002  . Dexa      OP hip T-2.94  . US echocardiography  10/06    normal LVF EF 55-65%  . Nuclear stress test  11/07    Normal   History  Substance Use Topics  . Smoking status: Former Smoker    Quit date: 02/07/1962  . Smokeless tobacco: Not on file  . Alcohol Use: No   Family History  Problem Relation Age of Onset  . Heart attack Father 2  . Coronary artery disease Sister 44    CABG  . Heart attack Brother 65  . Coronary artery disease      GF   Allergies  Allergen  Reactions  . Ace Inhibitors     REACTION: cough  . Ezetimibe     REACTION: myalgia  . Ezetimibe-Simvastatin     REACTION: joints swell  . Moxifloxacin   . Rosuvastatin     REACTION: myalgia  . Septra [Sulfamethoxazole-Trimethoprim]     Sick feeling   . Simvastatin     REACTION: myalgia  . Triaminic    Current Outpatient Prescriptions on File Prior to Visit  Medication Sig Dispense Refill  . cephALEXin (KEFLEX) 500 MG capsule Take 1 capsule (500 mg total) by mouth 4 (four) times daily.  28 capsule  0  . cholecalciferol (VITAMIN D) 1000 UNITS tablet Take 1,000 Units by mouth daily.        . cyclobenzaprine (FLEXERIL) 10 MG tablet Take 10 mg by mouth 3 (three) times daily as needed for muscle spasms.      . diclofenac sodium (VOLTAREN) 1 % GEL Apply 4 g topically daily as needed (for join pain).      Marland Kitchen levothyroxine (SYNTHROID, LEVOTHROID) 112 MCG tablet Take 112 mcg by mouth daily before breakfast.      . olmesartan-hydrochlorothiazide (BENICAR HCT) 40-25 MG per tablet Take 1 tablet by mouth daily.      . traMADol (ULTRAM) 50 MG tablet Take 50 mg by mouth every 6 (six) hours as needed for moderate pain.       No current facility-administered medications on file prior to visit.    Review of Systems Review of Systems  Constitutional: Negative for fever, appetite change, fatigue and unexpected weight change.  Eyes: Negative for pain and visual disturbance.  Respiratory: Negative for cough and shortness of breath.   Cardiovascular: Negative for cp or palpitations    Gastrointestinal: Negative for nausea, diarrhea and blood in stool/ black stool, pos for LLQ pain that is improved  Genitourinary: Negative for urgency and frequency.  Skin: Negative for pallor or rash   Neurological: Negative for weakness, light-headedness, numbness and headaches.  Hematological: Negative for adenopathy. Does not bruise/bleed easily.  Psychiatric/Behavioral: Negative for dysphoric mood. The patient is  not nervous/anxious.         Objective:   Physical Exam  Constitutional: She appears well-developed and well-nourished. No distress.  HENT:  Head: Normocephalic and atraumatic.  Eyes: Conjunctivae and EOM are normal. Pupils are equal, round, and reactive to light. No scleral icterus.  Neck: Normal range of motion. Neck supple.  Cardiovascular: Normal rate.   Pulmonary/Chest: Effort normal and breath sounds normal. No respiratory distress. She has no wheezes. She has no rales.  Abdominal: Soft. Bowel sounds are normal. She exhibits no distension and no mass. There is  no hepatosplenomegaly. There is tenderness in the left lower quadrant. There is no rigidity, no rebound, no guarding, no CVA tenderness, no tenderness at McBurney's point and negative Murphy's sign.  Musculoskeletal: Normal range of motion. She exhibits no edema.  Lymphadenopathy:    She has no cervical adenopathy.  Neurological: She is alert. She has normal reflexes.  Skin: Skin is warm and dry. No rash noted. No erythema. No pallor.  Psychiatric: She has a normal mood and affect.          Assessment & Plan:

## 2013-02-22 NOTE — Patient Instructions (Signed)
We will refer you to GI at check out [facility] today- to re check white blood cell count and also platelet count  Gradually get back to a regular diet  Take the golytely as needed  If your pain worsens in the meantime or if you have a fever let me know    The condition they saw on CT is called epiploic appendigitis     (not appendicitis)

## 2013-02-24 NOTE — Assessment & Plan Note (Signed)
Incidental finding in the ED Re check this today Pt states she does bruise a bit easier lately

## 2013-02-24 NOTE — Assessment & Plan Note (Signed)
Rev ED report and CT with pt - suspect this was cause of pain for the most part - and this has improved much  She will update if her pain returns and in the meantime work to manage constipation the best we can  Ref to GI for further eval- she requested Dr Collene Mares

## 2013-02-24 NOTE — Assessment & Plan Note (Signed)
Rev ED notes/ studies and lab in detail  Likely epiploic appendigitis in addn to constipation  Better after several days and also golytely  Reassuring exam today- tenderness is mild and symptoms are much improved  No diverticulitis on CT and her wbc did not elevate Re check cbc today  (also platelets -which were low) Ref to GI for this and chronic constipation/ bloating  inst to update if symptoms return

## 2013-02-24 NOTE — Assessment & Plan Note (Signed)
Pt was tx for this in ED - but cx returned neg Suspect she has sterile pyuria

## 2013-02-24 NOTE — Assessment & Plan Note (Signed)
This also added to her abd pain  Much imp after use of golytely  Refill given - and will continue to watch (esp if pt is on pain med) She will be going to GI  Rev CT and labs - no diverticulitis apparent

## 2013-02-26 ENCOUNTER — Other Ambulatory Visit: Payer: Self-pay | Admitting: Family Medicine

## 2013-02-26 DIAGNOSIS — D696 Thrombocytopenia, unspecified: Secondary | ICD-10-CM

## 2013-03-11 ENCOUNTER — Other Ambulatory Visit: Payer: Medicare Other

## 2013-03-11 ENCOUNTER — Ambulatory Visit (INDEPENDENT_AMBULATORY_CARE_PROVIDER_SITE_OTHER): Payer: Medicare Other | Admitting: Family Medicine

## 2013-03-11 ENCOUNTER — Telehealth: Payer: Self-pay | Admitting: Family Medicine

## 2013-03-11 ENCOUNTER — Encounter: Payer: Self-pay | Admitting: Family Medicine

## 2013-03-11 VITALS — BP 122/62 | HR 95 | Temp 98.3°F | Ht 63.0 in | Wt 143.5 lb

## 2013-03-11 DIAGNOSIS — N6452 Nipple discharge: Secondary | ICD-10-CM

## 2013-03-11 DIAGNOSIS — H6120 Impacted cerumen, unspecified ear: Secondary | ICD-10-CM

## 2013-03-11 DIAGNOSIS — N6459 Other signs and symptoms in breast: Secondary | ICD-10-CM

## 2013-03-11 NOTE — Patient Instructions (Addendum)
Stop up front for referral to the breast center for mammogram and ultrasound  Your ear looks good after irrigation- it will feel watery for a while

## 2013-03-11 NOTE — Progress Notes (Signed)
Pre-visit discussion using our clinic review tool. No additional management support is needed unless otherwise documented below in the visit note.  

## 2013-03-11 NOTE — Telephone Encounter (Signed)
error 

## 2013-03-11 NOTE — Progress Notes (Signed)
Subjective:    Patient ID: Dawn Aguirre, female    DOB: 24-Jun-1925, 80 y.o.   MRN: [id number]  HPI Pt is here with a breast problem  Saturday - pt notices some spots on blood on her nightgown  Saw that it came from her L nipple Last night -found some on her bra as well   Cannot express itself  No pain No trauma to the ares   She does have a mole on that side   Last mammogram 2013 - had some ductal ectasia  Had masses in it years ago -and had bx in the past  Was not cancer   Last few months has lost wt without dieting  Down from peak of 168 ? A year ago  Has had much stress/ numerous medical problems and a very busy lifestyle   Also -cannot hear well out of L ear  Thinks it may be occluded with wax  No pain or drainage No sinus symptoms   Patient Active Problem List   Diagnosis Date Noted  . Thrombocytopenia, unspecified 02/22/2013  . Epiploic appendagitis 02/22/2013  . Abdominal pain, left lower quadrant 02/22/2013  . Dry mouth 02/04/2013  . Acute sinusitis 12/25/2012  . Chest wall pain 12/25/2012  . Benign paroxysmal positional vertigo 09/14/2012  . Low back pain 09/07/2012  . Abnormal urinalysis 09/07/2012  . Constipation 11/04/2011  . UTI (lower urinary tract infection) 06/27/2011  . Aortic stenosis 08/31/2010  . Hyperlipidemia   . GASTRITIS 04/01/2009  . ABDOMINAL BLOATING 04/01/2009  . STRESS REACTION, ACUTE, WITH EMOTIONAL DISTURBANCE 10/12/2007  . Blades DISEASE, CERVICAL 10/12/2007  . HYPOTHYROIDISM 01/22/2007  . RAYNAUD'S SYNDROME 01/22/2007  . EXTERNAL HEMORRHOIDS 01/22/2007  . COPD 01/22/2007  . DIVERTICULOSIS, COLON 01/22/2007  . FIBROCYSTIC BREAST DISEASE 01/22/2007  . OSTEOPOROSIS 01/22/2007  . HYPERLIPIDEMIA 12/16/2006  . HYPERTENSION 12/16/2006  . GERD 12/16/2006  . RHEUMATOID ARTHRITIS 12/16/2006  . DYSPNEA ON EXERTION 12/16/2006  . COUGH, CHRONIC 12/16/2006  . TOBACCO ABUSE, HX OF 12/16/2006  . HYSTERECTOMY, HX OF 12/16/2006    Past Medical History  Diagnosis Date  . Flatulence, eructation, and gas pain   . Chronic airway obstruction, not elsewhere classified   . Cough   . Degeneration of cervical intervertebral disc   . Diverticulosis of colon (without mention of hemorrhage)   . Other dyspnea and respiratory abnormality   . External hemorrhoids without mention of complication   . Diffuse cystic mastopathy   . Unspecified gastritis and gastroduodenitis without mention of hemorrhage   . Esophageal reflux   . Other and unspecified hyperlipidemia   . Unspecified essential hypertension   . Unspecified hypothyroidism   . Acquired absence of organ, genital organs   . Osteoporosis, unspecified   . Other acute sinusitis   . Raynaud's syndrome   . Rheumatoid arthritis(714.0)     Sero-negative  . Predominant disturbance of emotions   . Personal history of tobacco use, presenting hazards to health   . Lichen sclerosus     back/abd/vulvar  . Spinal stenosis   . Colitis, ischemic 07/2000    ? infect  . Mastoiditis of right side 04/2005    admitted  . Fibula fracture 2006  . Hyperlipidemia    Past Surgical History  Procedure Laterality Date  . Abdominal hysterectomy  1968  . Dexa  2001    OP,heel  . Cardiollite  02/2002    Neg  . Nasal sinus surgery  05/2002  . Dexa  OP hip T-2.94  . US echocardiography  10/06    normal LVF EF 55-65%  . Nuclear stress test  11/07    Normal   History  Substance Use Topics  . Smoking status: Former Smoker    Quit date: 02/07/1962  . Smokeless tobacco: Not on file  . Alcohol Use: No   Family History  Problem Relation Age of Onset  . Heart attack Father 81  . Coronary artery disease Sister 88    CABG  . Heart attack Brother 35  . Coronary artery disease      GF   Allergies  Allergen Reactions  . Ace Inhibitors     REACTION: cough  . Ezetimibe     REACTION: myalgia  . Ezetimibe-Simvastatin     REACTION: joints swell  . Moxifloxacin   . Rosuvastatin      REACTION: myalgia  . Septra [Sulfamethoxazole-Trimethoprim]     Sick feeling   . Simvastatin     REACTION: myalgia  . Triaminic    Current Outpatient Prescriptions on File Prior to Visit  Medication Sig Dispense Refill  . cephALEXin (KEFLEX) 500 MG capsule Take 1 capsule (500 mg total) by mouth 4 (four) times daily.  28 capsule  0  . cholecalciferol (VITAMIN D) 1000 UNITS tablet Take 1,000 Units by mouth daily.        . cyclobenzaprine (FLEXERIL) 10 MG tablet Take 10 mg by mouth 3 (three) times daily as needed for muscle spasms.      . diclofenac sodium (VOLTAREN) 1 % GEL Apply 4 g topically daily as needed (for join pain).      Marland Kitchen levothyroxine (SYNTHROID, LEVOTHROID) 112 MCG tablet Take 112 mcg by mouth daily before breakfast.      . olmesartan-hydrochlorothiazide (BENICAR HCT) 40-25 MG per tablet Take 1 tablet by mouth daily.      . polyethylene glycol (GOLYTELY) 236 G solution Take 4,000 mLs by mouth once.  4000 mL  0  . traMADol (ULTRAM) 50 MG tablet Take 50 mg by mouth every 6 (six) hours as needed for moderate pain.       No current facility-administered medications on file prior to visit.       Review of Systems Review of Systems  Constitutional: Negative for fever, appetite change, fatigue and unexpected weight change.  Eyes: Negative for pain and visual disturbance.  ENT neg for sinus pain or congestion , neg for ear pain  Respiratory: Negative for cough and shortness of breath.   Cardiovascular: Negative for cp or palpitations    Gastrointestinal: Negative for nausea, diarrhea and constipation.  Genitourinary: Negative for urgency and frequency. neg for vaginal bleeding or pelvic pain  Skin: Negative for pallor or rash   MSK pos for arthritis joint pain  Neurological: Negative for weakness, light-headedness, numbness and headaches.  Hematological: Negative for adenopathy. Does not bruise/bleed easily.  Psychiatric/Behavioral: Negative for dysphoric mood. The patient  is not nervous/anxious.         Objective:   Physical Exam  Constitutional: She appears well-developed and well-nourished. No distress.  Well appearing elderly female  HENT:  Head: Normocephalic and atraumatic.  Left Ear: External ear normal.  Cerumen impaction R ear  Clear TM s/p simple ear irrigation  Scant cerumen L ear canal   Eyes: Conjunctivae and EOM are normal. Pupils are equal, round, and reactive to light. Right eye exhibits no discharge. Left eye exhibits no discharge. No scleral icterus.  Neck: Normal range of motion. Neck  supple.  Cardiovascular: Normal rate and regular rhythm.   Pulmonary/Chest: Effort normal and breath sounds normal.  Genitourinary: There is breast discharge and bleeding. No breast swelling or tenderness.  Bloody nipple discharge/ dark (heme pos) from L breast No M / skin changes/ tenderness in eigher breast    Musculoskeletal: She exhibits no edema.  Lymphadenopathy:    She has no cervical adenopathy.  Neurological: She is alert. She has normal reflexes.  Skin: Skin is warm and dry. No rash noted.  Psychiatric: She has a normal mood and affect.          Assessment & Plan:

## 2013-03-11 NOTE — Assessment & Plan Note (Signed)
Eliminated with simple irrigation Pt did experience relief and better hearing  Aftercare discussed

## 2013-03-11 NOTE — Assessment & Plan Note (Signed)
L breast  No M or tenderness Ductal ectasia seen on last mammogram in 2013 Ordered bilat dx mammograms and L [place] - at breast center Pend result

## 2013-03-15 ENCOUNTER — Other Ambulatory Visit: Payer: Self-pay | Admitting: Family Medicine

## 2013-03-18 ENCOUNTER — Other Ambulatory Visit: Payer: Self-pay | Admitting: Family Medicine

## 2013-03-18 ENCOUNTER — Ambulatory Visit
Admission: RE | Admit: 2013-03-18 | Discharge: 2013-03-18 | Disposition: A | Discharge status: Home / Self Care | Payer: Medicare Other | Source: Ambulatory Visit | Attending: Family Medicine | Admitting: Family Medicine

## 2013-03-18 ENCOUNTER — Ambulatory Visit
Admission: RE | Admit: 2013-03-18 | Discharge: 2013-03-18 | Disposition: A | Discharge status: Home / Self Care | Payer: Self-pay | Source: Ambulatory Visit | Attending: Family Medicine | Admitting: Family Medicine

## 2013-03-18 DIAGNOSIS — N6452 Nipple discharge: Secondary | ICD-10-CM

## 2013-04-01 ENCOUNTER — Ambulatory Visit
Admission: RE | Admit: 2013-04-01 | Discharge: 2013-04-01 | Disposition: A | Discharge status: Home / Self Care | Payer: Medicare Other | Source: Ambulatory Visit | Attending: Family Medicine | Admitting: Family Medicine

## 2013-04-01 DIAGNOSIS — N6452 Nipple discharge: Secondary | ICD-10-CM

## 2013-04-03 ENCOUNTER — Ambulatory Visit (INDEPENDENT_AMBULATORY_CARE_PROVIDER_SITE_OTHER): Payer: Medicare Other | Admitting: General Surgery

## 2013-04-03 ENCOUNTER — Encounter (INDEPENDENT_AMBULATORY_CARE_PROVIDER_SITE_OTHER): Payer: Self-pay | Admitting: General Surgery

## 2013-04-03 VITALS — BP 134/76 | HR 78 | Resp 18 | Ht 63.0 in | Wt 144.6 lb

## 2013-04-03 DIAGNOSIS — N63 Unspecified lump in unspecified breast: Secondary | ICD-10-CM

## 2013-04-03 DIAGNOSIS — N6320 Unspecified lump in the left breast, unspecified quadrant: Secondary | ICD-10-CM

## 2013-04-03 NOTE — Progress Notes (Signed)
Patient ID: Dawn Aguirre, female   DOB: December 28, 1925, 80 y.o.   MRN: [id number]  No chief complaint on file.   HPI Dawn Aguirre is a 80 y.o. female.  We are asked to see the patient in consultation by Dr. Glori Bickers to evaluate her for left bloody nipple discharge. The patient is an 80 year old white female who is been experiencing bloody discharge from the left nipple for the last 3 months. The discharge has been spontaneous. It does not occur every day. She denies any breast pain. There has been no discharge from the right nipple. She recently had a left-sided ductogram that showed multiple filling defects in the central left breast just above the nipple. These were not seen by ultrasound and have not been biopsied. The area seems to cover several centimeters based on the ductogram HPI  Past Medical History  Diagnosis Date  . Flatulence, eructation, and gas pain   . Chronic airway obstruction, not elsewhere classified   . Cough   . Degeneration of cervical intervertebral disc   . Diverticulosis of colon (without mention of hemorrhage)   . Other dyspnea and respiratory abnormality   . External hemorrhoids without mention of complication   . Diffuse cystic mastopathy   . Unspecified gastritis and gastroduodenitis without mention of hemorrhage   . Esophageal reflux   . Other and unspecified hyperlipidemia   . Unspecified essential hypertension   . Unspecified hypothyroidism   . Acquired absence of organ, genital organs   . Osteoporosis, unspecified   . Other acute sinusitis   . Raynaud's syndrome   . Rheumatoid arthritis(714.0)     Sero-negative  . Predominant disturbance of emotions   . Personal history of tobacco use, presenting hazards to health   . Lichen sclerosus     back/abd/vulvar  . Spinal stenosis   . Colitis, ischemic 07/2000    ? infect  . Mastoiditis of right side 04/2005    admitted  . Fibula fracture 2006  . Hyperlipidemia     Past Surgical History  Procedure  Laterality Date  . Abdominal hysterectomy  1968  . Dexa  2001    OP,heel  . Cardiollite  02/2002    Neg  . Nasal sinus surgery  05/2002  . Dexa      OP hip T-2.94  . US echocardiography  10/06    normal LVF EF 55-65%  . Nuclear stress test  11/07    Normal    Family History  Problem Relation Age of Onset  . Heart attack Father 48  . Coronary artery disease Sister 70    CABG  . Heart attack Brother 36  . Coronary artery disease      GF    Social History History  Substance Use Topics  . Smoking status: Former Smoker    Quit date: 02/07/1962  . Smokeless tobacco: Not on file  . Alcohol Use: No    Allergies  Allergen Reactions  . Ace Inhibitors     REACTION: cough  . Ezetimibe     REACTION: myalgia  . Ezetimibe-Simvastatin     REACTION: joints swell  . Moxifloxacin   . Rosuvastatin     REACTION: myalgia  . Septra [Sulfamethoxazole-Trimethoprim]     Sick feeling   . Simvastatin     REACTION: myalgia  . Triaminic     Current Outpatient Prescriptions  Medication Sig Dispense Refill  . cephALEXin (KEFLEX) 500 MG capsule Take 1 capsule (500 mg total) by mouth 4 (  four) times daily.  28 capsule  0  . cholecalciferol (VITAMIN D) 1000 UNITS tablet Take 1,000 Units by mouth daily.        . cyclobenzaprine (FLEXERIL) 10 MG tablet Take 10 mg by mouth 3 (three) times daily as needed for muscle spasms.      . diclofenac sodium (VOLTAREN) 1 % GEL Apply 4 g topically daily as needed (for join pain).      Marland Kitchen levothyroxine (SYNTHROID, LEVOTHROID) 112 MCG tablet Take 112 mcg by mouth daily before breakfast.      . Linaclotide (LINZESS) 290 MCG CAPS capsule Take 290 mcg by mouth 1 day or 1 dose. 30 min before breakfast      . olmesartan-hydrochlorothiazide (BENICAR HCT) 40-25 MG per tablet Take 1 tablet by mouth daily.      . polyethylene glycol (GOLYTELY) 236 G solution Take 4,000 mLs by mouth once.  4000 mL  0  . traMADol (ULTRAM) 50 MG tablet Take 50 mg by mouth every 6 (six)  hours as needed for moderate pain.       No current facility-administered medications for this visit.    Review of Systems Review of Systems  Constitutional: Negative.   HENT: Negative.   Eyes: Negative.   Respiratory: Negative.   Cardiovascular: Negative.   Gastrointestinal: Negative.   Endocrine: Negative.   Genitourinary: Negative.   Musculoskeletal: Negative.   Skin: Negative.   Allergic/Immunologic: Negative.   Neurological: Negative.   Hematological: Negative.   Psychiatric/Behavioral: Negative.     Blood pressure 134/76, pulse 78, resp. rate 18, height 5\' 3"  (1.6 m), weight 144 lb 9.6 oz (65.59 kg).  Physical Exam Physical Exam  Constitutional: She is oriented to person, place, and time. She appears well-developed and well-nourished.  HENT:  Head: Normocephalic and atraumatic.  Eyes: Conjunctivae and EOM are normal. Pupils are equal, round, and reactive to light.  Neck: Normal range of motion. Neck supple.  Cardiovascular: Normal rate and regular rhythm.   Murmur heard. Pulmonary/Chest: Effort normal and breath sounds normal.  There is no palpable mass in either breast. There are 2 enlarged palpable firm lymph nodes in the left axilla. I cannot recreate the discharge from the left nipple today.  Abdominal: Soft. Bowel sounds are normal.  Musculoskeletal: Normal range of motion.  Lymphadenopathy:    She has no cervical adenopathy.  Neurological: She is alert and oriented to person, place, and time.  Skin: Skin is warm and dry.  Psychiatric: She has a normal mood and affect. Her behavior is normal.    Data Reviewed As above  Assessment    The patient has bloody left nipple discharge with multiple filling defects in the central 12:00 position of the left breast. This is concerning for DCIS but the area has not been biopsied. She also has a couple of enlarged lymph nodes in the left axilla     Plan    Since these areas are not visible on mammogram or  ultrasound would be very difficult to localize the appropriate area for lumpectomy. I have discussed the situation with the radiologist and given her findings we both feel that the most appropriate next that would be to get an MRI study of her breasts. If these areas are visible then we could biopsy him under MRI guidance and marked with a clip so that we could localize better. This would also be a good study to evaluate the lymph nodes that are palpable as well  TOTH III,Tyriq Moragne S 04/03/2013, 3:15 PM

## 2013-04-03 NOTE — Patient Instructions (Signed)
i will call with next step in work up after consulting with radiologist

## 2013-04-05 ENCOUNTER — Telehealth (INDEPENDENT_AMBULATORY_CARE_PROVIDER_SITE_OTHER): Payer: Self-pay | Admitting: *Deleted

## 2013-04-05 NOTE — Telephone Encounter (Signed)
I spoke with pt and informed her of the appt for her breast MRI at [id number] on 04/12/13 with an arrival time of 8:45am.  She is agreeable with this appt.

## 2013-04-12 ENCOUNTER — Inpatient Hospital Stay: Admission: RE | Admit: 2013-04-12 | Payer: Medicare Other | Source: Ambulatory Visit

## 2013-04-15 ENCOUNTER — Ambulatory Visit
Admission: RE | Admit: 2013-04-15 | Discharge: 2013-04-15 | Disposition: A | Discharge status: Home / Self Care | Payer: Medicare Other | Source: Ambulatory Visit | Attending: General Surgery | Admitting: General Surgery

## 2013-04-15 DIAGNOSIS — N6320 Unspecified lump in the left breast, unspecified quadrant: Secondary | ICD-10-CM

## 2013-04-15 MED ORDER — GADOBENATE DIMEGLUMINE 529 MG/ML IV SOLN
13.0000 mL | Freq: Once | INTRAVENOUS | Status: AC | PRN
  Administered 2013-04-15: 13 mL via INTRAVENOUS

## 2013-04-17 ENCOUNTER — Telehealth (INDEPENDENT_AMBULATORY_CARE_PROVIDER_SITE_OTHER): Payer: Self-pay | Admitting: *Deleted

## 2013-04-17 NOTE — Telephone Encounter (Signed)
Patient called asking about the results of her MRI and scheduling surgery.  Explained that Dr. Marlou Starks is out of the office until Monday so he will not be able to review the MRI until then to make some decisions.  Patient states understanding that she will not receive a call until next week.  Patient is anxious however to get things scheduled.

## 2013-04-22 ENCOUNTER — Other Ambulatory Visit (INDEPENDENT_AMBULATORY_CARE_PROVIDER_SITE_OTHER): Payer: Self-pay | Admitting: General Surgery

## 2013-04-22 ENCOUNTER — Telehealth (INDEPENDENT_AMBULATORY_CARE_PROVIDER_SITE_OTHER): Payer: Self-pay

## 2013-04-22 DIAGNOSIS — N6320 Unspecified lump in the left breast, unspecified quadrant: Secondary | ICD-10-CM

## 2013-04-22 NOTE — Telephone Encounter (Signed)
Called pt with MRI report. Pt has multiple questions and would like Dr Marlou Starks to speak with her daughter since she in in the medical field and knows the appropriate questions to ask. Advised I would give Dr Marlou Starks her daughters number and we would call her this afternoon.

## 2013-04-22 NOTE — Telephone Encounter (Signed)
Message copied by Carlene Coria on Mon Apr 22, 2013  2:59 PM ------      Message from: [facility]      Created: Mon Apr 22, 2013 10:12 AM      Contact: [phone]       Please call with the results of her mri and let her know if she needs a appt ------

## 2013-04-22 NOTE — Telephone Encounter (Signed)
Orders written and given to surgery schedulers for when pt calls back and is ready to schedule.

## 2013-04-23 ENCOUNTER — Other Ambulatory Visit (INDEPENDENT_AMBULATORY_CARE_PROVIDER_SITE_OTHER): Payer: Self-pay | Admitting: General Surgery

## 2013-04-23 DIAGNOSIS — N6320 Unspecified lump in the left breast, unspecified quadrant: Secondary | ICD-10-CM

## 2013-05-07 ENCOUNTER — Telehealth (INDEPENDENT_AMBULATORY_CARE_PROVIDER_SITE_OTHER): Payer: Self-pay | Admitting: General Surgery

## 2013-05-07 NOTE — Telephone Encounter (Signed)
Patient calling in because she has some concerns about her surgery that is scheduled for 05/31/13.  She has had an increase in discharge from the breast and it has become darker in color.  She is not able to sleep, has lost her appetite,  And is losing weight.  She wanted to know if her surgery could be moved up at all now that she is starting to have an increase in symptoms.  I informed her that I would send this message to Dr. Marlou Starks and his assistant to let them know of what is going on.  I also informed her that I am unsure if they would be able to work out anything sooner as far as surgery, but that if Dr. Marlou Starks thinks it is a good idea then we could arrange for this to happen.

## 2013-05-20 ENCOUNTER — Other Ambulatory Visit: Payer: Self-pay | Admitting: *Deleted

## 2013-05-20 MED ORDER — LEVOTHYROXINE SODIUM 112 MCG PO TABS: 112.0000 ug | ORAL_TABLET | Freq: Every day | ORAL | Status: DC

## 2013-05-21 ENCOUNTER — Telehealth: Payer: Self-pay | Admitting: Family Medicine

## 2013-05-21 MED ORDER — ZOLPIDEM TARTRATE 5 MG PO TABS: 5.0000 mg | ORAL_TABLET | Freq: Every evening | ORAL | Status: DC | PRN

## 2013-05-21 NOTE — Telephone Encounter (Signed)
Pt was here with her son and we disc something to help her sleep during a stressful time Disc poss side eff and cautions re: ambien 5 mg and disc how to take it  Given written px to try prn with caution

## 2013-05-21 NOTE — Telephone Encounter (Signed)
Trouble with sleep - stressors and pain - short term Will try ambien 5 mg

## 2013-05-27 ENCOUNTER — Encounter (HOSPITAL_BASED_OUTPATIENT_CLINIC_OR_DEPARTMENT_OTHER): Payer: Self-pay | Admitting: *Deleted

## 2013-05-27 NOTE — Progress Notes (Signed)
Will come in for bmet-ekg-very active-she had been having severe back pain-taking meds-will take care this surgery then see a dr for back-husband and daughter ti cime with her

## 2013-05-30 ENCOUNTER — Telehealth (INDEPENDENT_AMBULATORY_CARE_PROVIDER_SITE_OTHER): Payer: Self-pay | Admitting: General Surgery

## 2013-05-30 ENCOUNTER — Encounter (HOSPITAL_BASED_OUTPATIENT_CLINIC_OR_DEPARTMENT_OTHER)
Admission: RE | Admit: 2013-05-30 | Discharge: 2013-05-30 | Disposition: A | Discharge status: Home / Self Care | Payer: Medicare Other | Source: Ambulatory Visit | Attending: General Surgery | Admitting: General Surgery

## 2013-05-30 ENCOUNTER — Other Ambulatory Visit: Payer: Self-pay

## 2013-05-30 DIAGNOSIS — Z01818 Encounter for other preprocedural examination: Secondary | ICD-10-CM

## 2013-05-30 DIAGNOSIS — Z01812 Encounter for preprocedural laboratory examination: Secondary | ICD-10-CM | POA: Insufficient documentation

## 2013-05-30 LAB — BASIC METABOLIC PANEL
BUN: 25 mg/dL — ABNORMAL HIGH (ref 6–23)
CO2: 26 mEq/L (ref 19–32)
Calcium: 9.4 mg/dL (ref 8.4–10.5)
Chloride: 101 mEq/L (ref 96–112)
Creatinine, Ser: 0.94 mg/dL (ref 0.50–1.10)
GFR calc Af Amer: 62 mL/min — ABNORMAL LOW (ref >90)
GFR calc non Af Amer: 53 mL/min — ABNORMAL LOW (ref >90)
Glucose, Bld: 115 mg/dL — ABNORMAL HIGH (ref 70–99)
Potassium: 4.1 mEq/L (ref 3.7–5.3)
Sodium: 139 mEq/L (ref 137–147)

## 2013-05-30 NOTE — Telephone Encounter (Signed)
Cindy with the hospital called to let us know she was drawing for preop lab work on this patient who is having surgery tomorrow.  Informed us she was running a BMET but that she noticed the patient had a platelet count that was low back in January and she thought it would be best to rerun a new one since it hadnt been rechecked.  She went ahead a drew a vile to also run a CBC prior to surgery.  I informed her that I would go ahead and put this order in epic and route it for Dr. Marlou Starks to sign off on.

## 2013-05-31 ENCOUNTER — Ambulatory Visit (HOSPITAL_BASED_OUTPATIENT_CLINIC_OR_DEPARTMENT_OTHER): Admission: RE | Admit: 2013-05-31 | Payer: Medicare Other | Source: Ambulatory Visit | Admitting: General Surgery

## 2013-05-31 ENCOUNTER — Encounter (HOSPITAL_BASED_OUTPATIENT_CLINIC_OR_DEPARTMENT_OTHER): Payer: Self-pay | Admitting: Anesthesiology

## 2013-05-31 ENCOUNTER — Ambulatory Visit
Admission: RE | Admit: 2013-05-31 | Discharge: 2013-05-31 | Disposition: A | Discharge status: Home / Self Care | Payer: Medicare Other | Source: Ambulatory Visit | Attending: General Surgery | Admitting: General Surgery

## 2013-05-31 ENCOUNTER — Telehealth (INDEPENDENT_AMBULATORY_CARE_PROVIDER_SITE_OTHER): Payer: Self-pay

## 2013-05-31 DIAGNOSIS — N6320 Unspecified lump in the left breast, unspecified quadrant: Secondary | ICD-10-CM

## 2013-05-31 SURGERY — BREAST LUMPECTOMY WITH NEEDLE LOCALIZATION
Anesthesia: General | Site: Breast | Laterality: Left

## 2013-05-31 MED ORDER — FENTANYL CITRATE 0.05 MG/ML IJ SOLN
INTRAMUSCULAR | Status: AC
  Filled 2013-05-31: qty 6

## 2013-05-31 NOTE — Telephone Encounter (Signed)
Called pt and family member answered. I asked if I could speak to her and he said she was busy and would have her call me back. I was trying to explain why her surgery was cancelled today and ask if she wanted to make an appointment for Monday to go over this in person.

## 2013-06-03 ENCOUNTER — Encounter (INDEPENDENT_AMBULATORY_CARE_PROVIDER_SITE_OTHER): Payer: Medicare Other | Admitting: General Surgery

## 2013-06-05 ENCOUNTER — Other Ambulatory Visit: Payer: Self-pay | Admitting: Family Medicine

## 2013-06-05 NOTE — Telephone Encounter (Signed)
Rx called in as prescribed 

## 2013-06-05 NOTE — Telephone Encounter (Signed)
Electronic refill request, please advise  

## 2013-06-05 NOTE — Telephone Encounter (Signed)
Px written for call in   

## 2013-06-07 ENCOUNTER — Encounter: Payer: Self-pay | Admitting: Family Medicine

## 2013-06-07 ENCOUNTER — Encounter: Payer: Self-pay | Admitting: Radiology

## 2013-06-07 ENCOUNTER — Ambulatory Visit (INDEPENDENT_AMBULATORY_CARE_PROVIDER_SITE_OTHER): Payer: Medicare Other | Admitting: Family Medicine

## 2013-06-07 VITALS — BP 138/82 | HR 75 | Temp 98.3°F | Ht 63.0 in | Wt 146.5 lb

## 2013-06-07 DIAGNOSIS — R141 Gas pain: Secondary | ICD-10-CM

## 2013-06-07 DIAGNOSIS — R142 Eructation: Secondary | ICD-10-CM

## 2013-06-07 DIAGNOSIS — N6452 Nipple discharge: Secondary | ICD-10-CM

## 2013-06-07 DIAGNOSIS — M545 Low back pain, unspecified: Secondary | ICD-10-CM

## 2013-06-07 DIAGNOSIS — N6459 Other signs and symptoms in breast: Secondary | ICD-10-CM

## 2013-06-07 DIAGNOSIS — R143 Flatulence: Secondary | ICD-10-CM

## 2013-06-07 MED ORDER — OXYCODONE-ACETAMINOPHEN 5-325 MG PO TABS: 1.0000 | ORAL_TABLET | ORAL | Status: DC | PRN

## 2013-06-07 NOTE — Progress Notes (Signed)
Pre visit review using our clinic review tool, if applicable. No additional management support is needed unless otherwise documented below in the visit note. 

## 2013-06-07 NOTE — Patient Instructions (Signed)
Proceed with work up from Dr Collene Mares Also the pain clinic  I think getting a 2nd opinion from [place] regarding breast issue is a good idea-call back with what you need re: referral  Use oxycodone with caution -do not mix with tramadol  Know it may constipate you also  Take care of yourself

## 2013-06-07 NOTE — Progress Notes (Signed)
Subjective:    Patient ID: Dawn Aguirre, female    DOB: [birth year], 80 y.o.   MRN: [id number]  HPI Here with a lot going on - many issues  Here to ask questions as she proceeds with a plan   Abdominal complaints - she had a colonoscopy upcoming  Had to be put off for several mo due to her breast surgery  She had a lumpectomy planned  The am of the surgery - they could not express any fluid from the nipple  Dawn Aguirre and Dr Marlou Starks  ? Problems with synch her surgery  Did not like the method they will use  Does not think that they communicated well with each other or her and also did not answer her questions   ? If she needs a referral for a [facility] - will call back with that   Has appt with spine care/ pain management office    5/26  (Dr Vira Blanco recommended by Dr Garen Grams) Her back pain is severe-now going down both legs  She walks through the night  ambien did not help sleep at all  Needs pain med to get through the next mo to the spine center  Oxycodone works best -(thinks this will work better than tramadol) - will be very careful with it  Also knows this will constipate her   Last dose was over a week ago  Last dose of tramadol over a week ago  Not taking flexeril   Patient Active Problem List   Diagnosis Date Noted  . Breast discharge 06/07/2013  . Left breast mass 04/03/2013  . Bloody discharge from nipple 03/11/2013  . Cerumen impaction 03/11/2013  . Thrombocytopenia, unspecified 02/22/2013  . Epiploic appendagitis 02/22/2013  . Abdominal pain, left lower quadrant 02/22/2013  . Dry mouth 02/04/2013  . Acute sinusitis 12/25/2012  . Chest wall pain 12/25/2012  . Benign paroxysmal positional vertigo 09/14/2012  . Low back pain 09/07/2012  . Abnormal urinalysis 09/07/2012  . Constipation 11/04/2011  . UTI (lower urinary tract infection) 06/27/2011  . Aortic stenosis 08/31/2010  . Hyperlipidemia   . GASTRITIS 04/01/2009  . ABDOMINAL BLOATING 04/01/2009  .  STRESS REACTION, ACUTE, WITH EMOTIONAL DISTURBANCE 10/12/2007  . [place] DISEASE, CERVICAL 10/12/2007  . HYPOTHYROIDISM 01/22/2007  . RAYNAUD'S SYNDROME 01/22/2007  . EXTERNAL HEMORRHOIDS 01/22/2007  . COPD 01/22/2007  . DIVERTICULOSIS, COLON 01/22/2007  . FIBROCYSTIC BREAST DISEASE 01/22/2007  . OSTEOPOROSIS 01/22/2007  . HYPERLIPIDEMIA 12/16/2006  . HYPERTENSION 12/16/2006  . GERD 12/16/2006  . RHEUMATOID ARTHRITIS 12/16/2006  . DYSPNEA ON EXERTION 12/16/2006  . COUGH, CHRONIC 12/16/2006  . TOBACCO ABUSE, HX OF 12/16/2006  . HYSTERECTOMY, HX OF 12/16/2006   Past Medical History  Diagnosis Date  . Flatulence, eructation, and gas pain   . Chronic airway obstruction, not elsewhere classified   . Cough   . Degeneration of cervical intervertebral disc   . Diverticulosis of colon (without mention of hemorrhage)   . Other dyspnea and respiratory abnormality   . External hemorrhoids without mention of complication   . Diffuse cystic mastopathy   . Unspecified gastritis and gastroduodenitis without mention of hemorrhage   . Esophageal reflux   . Other and unspecified hyperlipidemia   . Unspecified essential hypertension   . Unspecified hypothyroidism   . Acquired absence of organ, genital organs   . Osteoporosis, unspecified   . Other acute sinusitis   . Raynaud's syndrome   . Rheumatoid arthritis(714.0)     Sero-negative  .  Predominant disturbance of emotions   . Personal history of tobacco use, presenting hazards to health   . Lichen sclerosus     back/abd/vulvar  . Spinal stenosis   . Colitis, ischemic 07/2000    ? infect  . Mastoiditis of right side 04/2005    admitted  . Fibula fracture 2006  . Hyperlipidemia   . Wears dentures     top   Past Surgical History  Procedure Laterality Date  . Abdominal hysterectomy  1968  . Dexa  2001    OP,heel  . Cardiollite  02/2002    Neg  . Nasal sinus surgery  05/2002  . Dexa      OP hip T-2.94  . US echocardiography  10/06     normal LVF EF 55-65%  . Nuclear stress test  11/07    Normal  . Tonsillectomy    . Colonoscopy     History  Substance Use Topics  . Smoking status: Former Smoker    Quit date: 02/07/1962  . Smokeless tobacco: Not on file  . Alcohol Use: Yes     Comment: occ wine   Family History  Problem Relation Age of Onset  . Heart attack Father 21  . Coronary artery disease Sister 25    CABG  . Heart attack Brother 34  . Coronary artery disease      GF   Allergies  Allergen Reactions  . Ace Inhibitors     REACTION: cough  . Ezetimibe     REACTION: myalgia  . Ezetimibe-Simvastatin     REACTION: joints swell  . Moxifloxacin   . Rosuvastatin     REACTION: myalgia  . Septra [Sulfamethoxazole-Trimethoprim]     Sick feeling   . Simvastatin     REACTION: myalgia  . Triaminic    Current Outpatient Prescriptions on File Prior to Visit  Medication Sig Dispense Refill  . cholecalciferol (VITAMIN D) 1000 UNITS tablet Take 1,000 Units by mouth daily.        . cyclobenzaprine (FLEXERIL) 10 MG tablet Take 10 mg by mouth 3 (three) times daily as needed for muscle spasms.      . diclofenac sodium (VOLTAREN) 1 % GEL Apply 4 g topically daily as needed (for join pain).      Marland Kitchen docusate sodium (COLACE) 100 MG capsule Take 100 mg by mouth 2 (two) times daily.      Marland Kitchen levothyroxine (SYNTHROID, LEVOTHROID) 112 MCG tablet Take 1 tablet (112 mcg total) by mouth daily before breakfast.  90 tablet  1  . olmesartan-hydrochlorothiazide (BENICAR HCT) 40-25 MG per tablet Take 1 tablet by mouth daily.      . Polyethylene Glycol 3350 (MIRALAX PO) Take by mouth.      . traMADol (ULTRAM) 50 MG tablet TAKE 1 TO 2 TABLETS BY MOUTH UP TO 3 TIMES A DAY AS NEEDED FOR PAIN  90 tablet  0   No current facility-administered medications on file prior to visit.    Patient Active Problem List   Diagnosis Date Noted  . Left breast mass 04/03/2013  . Bloody discharge from nipple 03/11/2013  . Cerumen impaction 03/11/2013    . Thrombocytopenia, unspecified 02/22/2013  . Epiploic appendagitis 02/22/2013  . Abdominal pain, left lower quadrant 02/22/2013  . Dry mouth 02/04/2013  . Acute sinusitis 12/25/2012  . Chest wall pain 12/25/2012  . Benign paroxysmal positional vertigo 09/14/2012  . Low back pain 09/07/2012  . Abnormal urinalysis 09/07/2012  . Constipation 11/04/2011  .  UTI (lower urinary tract infection) 06/27/2011  . Aortic stenosis 08/31/2010  . Hyperlipidemia   . GASTRITIS 04/01/2009  . ABDOMINAL BLOATING 04/01/2009  . STRESS REACTION, ACUTE, WITH EMOTIONAL DISTURBANCE 10/12/2007  . [place] DISEASE, CERVICAL 10/12/2007  . HYPOTHYROIDISM 01/22/2007  . RAYNAUD'S SYNDROME 01/22/2007  . EXTERNAL HEMORRHOIDS 01/22/2007  . COPD 01/22/2007  . DIVERTICULOSIS, COLON 01/22/2007  . FIBROCYSTIC BREAST DISEASE 01/22/2007  . OSTEOPOROSIS 01/22/2007  . HYPERLIPIDEMIA 12/16/2006  . HYPERTENSION 12/16/2006  . GERD 12/16/2006  . RHEUMATOID ARTHRITIS 12/16/2006  . DYSPNEA ON EXERTION 12/16/2006  . COUGH, CHRONIC 12/16/2006  . TOBACCO ABUSE, HX OF 12/16/2006  . HYSTERECTOMY, HX OF 12/16/2006   Past Medical History  Diagnosis Date  . Flatulence, eructation, and gas pain   . Chronic airway obstruction, not elsewhere classified   . Cough   . Degeneration of cervical intervertebral disc   . Diverticulosis of colon (without mention of hemorrhage)   . Other dyspnea and respiratory abnormality   . External hemorrhoids without mention of complication   . Diffuse cystic mastopathy   . Unspecified gastritis and gastroduodenitis without mention of hemorrhage   . Esophageal reflux   . Other and unspecified hyperlipidemia   . Unspecified essential hypertension   . Unspecified hypothyroidism   . Acquired absence of organ, genital organs   . Osteoporosis, unspecified   . Other acute sinusitis   . Raynaud's syndrome   . Rheumatoid arthritis(714.0)     Sero-negative  . Predominant disturbance of emotions   .  Personal history of tobacco use, presenting hazards to health   . Lichen sclerosus     back/abd/vulvar  . Spinal stenosis   . Colitis, ischemic 07/2000    ? infect  . Mastoiditis of right side 04/2005    admitted  . Fibula fracture 2006  . Hyperlipidemia   . Wears dentures     top   Past Surgical History  Procedure Laterality Date  . Abdominal hysterectomy  1968  . Dexa  2001    OP,heel  . Cardiollite  02/2002    Neg  . Nasal sinus surgery  05/2002  . Dexa      OP hip T-2.94  . US echocardiography  10/06    normal LVF EF 55-65%  . Nuclear stress test  11/07    Normal  . Tonsillectomy    . Colonoscopy     History  Substance Use Topics  . Smoking status: Former Smoker    Quit date: 02/07/1962  . Smokeless tobacco: Not on file  . Alcohol Use: Yes     Comment: occ wine   Family History  Problem Relation Age of Onset  . Heart attack Father 30  . Coronary artery disease Sister 39    CABG  . Heart attack Brother 48  . Coronary artery disease      GF   Allergies  Allergen Reactions  . Ace Inhibitors     REACTION: cough  . Ezetimibe     REACTION: myalgia  . Ezetimibe-Simvastatin     REACTION: joints swell  . Moxifloxacin   . Rosuvastatin     REACTION: myalgia  . Septra [Sulfamethoxazole-Trimethoprim]     Sick feeling   . Simvastatin     REACTION: myalgia  . Triaminic    Current Outpatient Prescriptions on File Prior to Visit  Medication Sig Dispense Refill  . cholecalciferol (VITAMIN D) 1000 UNITS tablet Take 1,000 Units by mouth daily.        Marland Kitchen  cyclobenzaprine (FLEXERIL) 10 MG tablet Take 10 mg by mouth 3 (three) times daily as needed for muscle spasms.      . diclofenac sodium (VOLTAREN) 1 % GEL Apply 4 g topically daily as needed (for join pain).      Marland Kitchen docusate sodium (COLACE) 100 MG capsule Take 100 mg by mouth 2 (two) times daily.      Marland Kitchen levothyroxine (SYNTHROID, LEVOTHROID) 112 MCG tablet Take 1 tablet (112 mcg total) by mouth daily before breakfast.   90 tablet  1  . olmesartan-hydrochlorothiazide (BENICAR HCT) 40-25 MG per tablet Take 1 tablet by mouth daily.      . Polyethylene Glycol 3350 (MIRALAX PO) Take by mouth.      . oxyCODONE-acetaminophen (PERCOCET/ROXICET) 5-325 MG per tablet Take by mouth every 4 (four) hours as needed for severe pain.      . traMADol (ULTRAM) 50 MG tablet TAKE 1 TO 2 TABLETS BY MOUTH UP TO 3 TIMES A DAY AS NEEDED FOR PAIN  90 tablet  0  . zolpidem (AMBIEN) 5 MG tablet Take 1 tablet (5 mg total) by mouth at bedtime as needed for sleep.  15 tablet  1   No current facility-administered medications on file prior to visit.    Review of Systems Review of Systems  Constitutional: Negative for fever, appetite change,  and unexpected weight change.  Eyes: Negative for pain and visual disturbance.  Respiratory: Negative for cough and shortness of breath.   Cardiovascular: Negative for cp or palpitations    Gastrointestinal: Negative for nausea, diarrhea and constipation.  Genitourinary: Negative for urgency and frequency. pos for breast nipple discharge that is bloody Skin: Negative for pallor or rash   MSK pos for back pain ongoing/ neg for acutely swollen joints  Neurological: Negative for weakness, light-headedness, numbness and headaches.  Hematological: Negative for adenopathy. Does not bruise/bleed easily.  Psychiatric/Behavioral: Negative for dysphoric mood. The patient is  nervous/anxious.  pos for trouble sleeping due to back pain        Objective:   Physical Exam  Constitutional: She appears well-developed and well-nourished. No distress.  HENT:  Head: Normocephalic and atraumatic.  Eyes: Conjunctivae and EOM are normal. Pupils are equal, round, and reactive to light. Right eye exhibits no discharge. Left eye exhibits no discharge.  Neck: Normal range of motion. Neck supple.  Musculoskeletal: She exhibits no edema.  Slow labored gait due to back pain Limited flex and ext of LS  Skin: Skin is warm  and dry. No rash noted. No erythema. No pallor.  Psychiatric: She has a normal mood and affect.          Assessment & Plan:

## 2013-06-09 NOTE — Assessment & Plan Note (Signed)
Oxycodone px with caution while pt is waiting for her pain clinic/ spine clinic appt upcoming  Disc habit forming pot/ sedation and poss of constipation and she voiced understanding  This will likely help sleep

## 2013-06-09 NOTE — Assessment & Plan Note (Signed)
Pt was unhappy with her experience at [facility] - she wishes to get a 2nd opinion- will call [facility] and let us know if a referral is needed

## 2013-06-09 NOTE — Assessment & Plan Note (Signed)
Ongoing  Constipation as well  Will f/u with Dr Collene Mares for colonosc and further eval after she makes plan for breast issue and has her visit for back pain

## 2013-06-11 ENCOUNTER — Ambulatory Visit (INDEPENDENT_AMBULATORY_CARE_PROVIDER_SITE_OTHER): Payer: Medicare Other | Admitting: General Surgery

## 2013-06-11 ENCOUNTER — Encounter (INDEPENDENT_AMBULATORY_CARE_PROVIDER_SITE_OTHER): Payer: Self-pay | Admitting: General Surgery

## 2013-06-11 VITALS — BP 142/80 | HR 76 | Temp 97.6°F | Ht 63.0 in | Wt 144.8 lb

## 2013-06-11 DIAGNOSIS — N6452 Nipple discharge: Secondary | ICD-10-CM

## 2013-06-11 DIAGNOSIS — N6459 Other signs and symptoms in breast: Secondary | ICD-10-CM

## 2013-06-11 NOTE — Progress Notes (Signed)
Subjective:     Patient ID: Dawn Aguirre, female   DOB: 12/11/1925, 80 y.o.   MRN: [id number]  HPI The patient is an 80 year old white female who's had intermittent bloody drainage from her left nipple. We recently tried to do a ductogram to localize this area so that it could be removed. The ductogram was unsuccessful. I discussed her findings with the radiologist who felt like these intraductal filling defects were very small and most likely benign.  Review of Systems  Constitutional: Negative.   HENT: Negative.   Eyes: Negative.   Respiratory: Negative.   Cardiovascular: Negative.   Gastrointestinal: Negative.   Endocrine: Negative.   Genitourinary: Negative.   Musculoskeletal: Negative.   Skin: Negative.   Allergic/Immunologic: Negative.   Neurological: Negative.   Hematological: Negative.   Psychiatric/Behavioral: Negative.        Objective:   Physical Exam  Constitutional: She is oriented to person, place, and time. She appears well-developed and well-nourished.  HENT:  Head: Normocephalic and atraumatic.  Eyes: Conjunctivae and EOM are normal. Pupils are equal, round, and reactive to light.  Neck: Normal range of motion. Neck supple.  Cardiovascular: Normal rate, regular rhythm and normal heart sounds.   Pulmonary/Chest: Effort normal and breath sounds normal.  Abdominal: Soft. Bowel sounds are normal.  Musculoskeletal: Normal range of motion.  Lymphadenopathy:    She has no cervical adenopathy.  Neurological: She is alert and oriented to person, place, and time.  Skin: Skin is warm and dry.  Psychiatric: She has a normal mood and affect. Her behavior is normal.       Assessment:     The patient most likely has a small area deep in her breast of intraductal papilloma     Plan:     Because of her age and the negative findings on mammogram and ultrasound I think this area is unlikely to cause her harm. Because of this since we could not get a ductogram we will  continue to follow her closely. I will plan to see her back in 6 months with a followup mammogram. She is in agreement with this treatment plan.

## 2013-06-11 NOTE — Patient Instructions (Signed)
Call if discharge becomes excessive

## 2013-06-14 ENCOUNTER — Encounter (INDEPENDENT_AMBULATORY_CARE_PROVIDER_SITE_OTHER): Payer: Medicare Other | Admitting: General Surgery

## 2013-07-05 ENCOUNTER — Encounter: Payer: Self-pay | Admitting: Family Medicine

## 2013-07-30 ENCOUNTER — Other Ambulatory Visit: Payer: Self-pay | Admitting: Pain Medicine

## 2013-07-30 DIAGNOSIS — M542 Cervicalgia: Secondary | ICD-10-CM

## 2013-08-06 ENCOUNTER — Telehealth: Payer: Self-pay | Admitting: Family Medicine

## 2013-08-06 ENCOUNTER — Other Ambulatory Visit: Payer: Medicare Other

## 2013-08-06 MED ORDER — OXYCODONE-ACETAMINOPHEN 5-325 MG PO TABS: 1.0000 | ORAL_TABLET | ORAL | Status: DC | PRN

## 2013-08-06 NOTE — Telephone Encounter (Signed)
Needs refill on oxycodone - seeing ortho/pending shot for back pain with orthopedics Printed and given to pt

## 2013-10-04 ENCOUNTER — Ambulatory Visit: Payer: Medicare Other | Admitting: Family Medicine

## 2013-10-07 ENCOUNTER — Encounter: Payer: Self-pay | Admitting: Family Medicine

## 2013-10-07 ENCOUNTER — Ambulatory Visit (INDEPENDENT_AMBULATORY_CARE_PROVIDER_SITE_OTHER): Payer: Medicare Other | Admitting: Family Medicine

## 2013-10-07 VITALS — BP 142/78 | HR 76 | Temp 98.4°F | Ht 63.0 in | Wt 145.5 lb

## 2013-10-07 DIAGNOSIS — K5901 Slow transit constipation: Secondary | ICD-10-CM

## 2013-10-07 DIAGNOSIS — N390 Urinary tract infection, site not specified: Secondary | ICD-10-CM

## 2013-10-07 DIAGNOSIS — Z87440 Personal history of urinary (tract) infections: Secondary | ICD-10-CM

## 2013-10-07 LAB — POCT URINALYSIS DIPSTICK
Bilirubin, UA: NEGATIVE
Glucose, UA: NEGATIVE
Ketones, UA: NEGATIVE
Nitrite, UA: NEGATIVE
Protein, UA: NEGATIVE
Spec Grav, UA: 1.01
Urobilinogen, UA: 0.2
pH, UA: 6

## 2013-10-07 MED ORDER — CEPHALEXIN 500 MG PO CAPS: 500.0000 mg | ORAL_CAPSULE | Freq: Two times a day (BID) | ORAL | Status: DC

## 2013-10-07 NOTE — Assessment & Plan Note (Signed)
Pt was tx with macrobid  ucx from [place] showed mixed flora Still symptomatic Need to put off mastectomy until we get this under control  Enc fluids

## 2013-10-07 NOTE — Patient Instructions (Signed)
Use the golytely for constipation if needed  Keep up a good fluid intake  I am sending urine for culture  Take the keflex as directed -if any problems, let us know

## 2013-10-07 NOTE — Progress Notes (Signed)
Pre visit review using our clinic review tool, if applicable. No additional management support is needed unless otherwise documented below in the visit note. 

## 2013-10-07 NOTE — Assessment & Plan Note (Signed)
Worse lately on narcotic med  Will see if abx for uti helps (keflex) If not will use the golytely she has  Rev recent labs from [facility]

## 2013-10-07 NOTE — Progress Notes (Signed)
Subjective:    Patient ID: Dawn Aguirre, female    DOB: February 16, 1925, 80 y.o.   MRN: [id number]  HPI Here for malaise /recent uti  Had infection tx by [facility] (has mastectomy coming up)  Antibiotic- just finished yesterday - ? The name of it  Narcotic -percocet  Not on a probiotic   Having a lot of acid reflux - spitting up clear mucous  Full of gas Lot of burping  Is constipated  Drinking lots and lots of water  Also prune juice and correctol , (had a small bm) Not using miralax  She does have some golytely  Has taken prilosec otc which is helpful with acid reflux symptoms    Has not had the sharp pains like diverticulitis in the past -(has had appendigitis) A little pain in LLQ   No fever but temp is on the high side for her (usually 97)  Results for orders placed in visit on 10/07/13  POCT URINALYSIS DIPSTICK      Result Value Ref Range   Color, UA yellow     Clarity, UA clear     Glucose, UA neg.     Bilirubin, UA neg.     Ketones, UA neg.     Spec Grav, UA 1.010     Blood, UA Moderate     pH, UA 6.0     Protein, UA neg.     Urobilinogen, UA 0.2     Nitrite, UA neg.     Leukocytes, UA Trace      Her surgery is scheduled for Wednesday Will have to cancel that for uti again    Patient Active Problem List   Diagnosis Date Noted  . Breast discharge 06/07/2013  . Left breast mass 04/03/2013  . Bloody discharge from nipple 03/11/2013  . Cerumen impaction 03/11/2013  . Thrombocytopenia, unspecified 02/22/2013  . Epiploic appendagitis 02/22/2013  . Abdominal pain, left lower quadrant 02/22/2013  . Dry mouth 02/04/2013  . Acute sinusitis 12/25/2012  . Chest wall pain 12/25/2012  . Benign paroxysmal positional vertigo 09/14/2012  . Low back pain 09/07/2012  . Abnormal urinalysis 09/07/2012  . Constipation 11/04/2011  . UTI (lower urinary tract infection) 06/27/2011  . Aortic stenosis 08/31/2010  . Hyperlipidemia   . GASTRITIS 04/01/2009  . ABDOMINAL  BLOATING 04/01/2009  . STRESS REACTION, ACUTE, WITH EMOTIONAL DISTURBANCE 10/12/2007  . [place] DISEASE, CERVICAL 10/12/2007  . HYPOTHYROIDISM 01/22/2007  . RAYNAUD'S SYNDROME 01/22/2007  . EXTERNAL HEMORRHOIDS 01/22/2007  . COPD 01/22/2007  . DIVERTICULOSIS, COLON 01/22/2007  . FIBROCYSTIC BREAST DISEASE 01/22/2007  . OSTEOPOROSIS 01/22/2007  . HYPERLIPIDEMIA 12/16/2006  . HYPERTENSION 12/16/2006  . GERD 12/16/2006  . RHEUMATOID ARTHRITIS 12/16/2006  . DYSPNEA ON EXERTION 12/16/2006  . COUGH, CHRONIC 12/16/2006  . TOBACCO ABUSE, HX OF 12/16/2006  . HYSTERECTOMY, HX OF 12/16/2006   Past Medical History  Diagnosis Date  . Flatulence, eructation, and gas pain   . Chronic airway obstruction, not elsewhere classified   . Cough   . Degeneration of cervical intervertebral disc   . Diverticulosis of colon (without mention of hemorrhage)   . Other dyspnea and respiratory abnormality   . External hemorrhoids without mention of complication   . Diffuse cystic mastopathy   . Unspecified gastritis and gastroduodenitis without mention of hemorrhage   . Esophageal reflux   . Other and unspecified hyperlipidemia   . Unspecified essential hypertension   . Unspecified hypothyroidism   . Acquired absence of organ, genital organs   .  Osteoporosis, unspecified   . Other acute sinusitis   . Raynaud's syndrome   . Rheumatoid arthritis(714.0)     Sero-negative  . Predominant disturbance of emotions   . Personal history of tobacco use, presenting hazards to health   . Lichen sclerosus     back/abd/vulvar  . Spinal stenosis   . Colitis, ischemic 07/2000    ? infect  . Mastoiditis of right side 04/2005    admitted  . Fibula fracture 2006  . Hyperlipidemia   . Wears dentures     top   Past Surgical History  Procedure Laterality Date  . Abdominal hysterectomy  1968  . Dexa  2001    OP,heel  . Cardiollite  02/2002    Neg  . Nasal sinus surgery  05/2002  . Dexa      OP hip T-2.94  . US  echocardiography  10/06    normal LVF EF 55-65%  . Nuclear stress test  11/07    Normal  . Tonsillectomy    . Colonoscopy     History  Substance Use Topics  . Smoking status: Former Smoker    Quit date: 02/07/1962  . Smokeless tobacco: Not on file  . Alcohol Use: Yes     Comment: occ wine   Family History  Problem Relation Age of Onset  . Heart attack Father 80  . Coronary artery disease Sister 88    CABG  . Heart attack Brother 23  . Coronary artery disease      GF   Allergies  Allergen Reactions  . Ace Inhibitors     REACTION: cough  . Ezetimibe     REACTION: myalgia  . Ezetimibe-Simvastatin     REACTION: joints swell  . Moxifloxacin   . Rosuvastatin     REACTION: myalgia  . Septra [Sulfamethoxazole-Trimethoprim]     Sick feeling   . Simvastatin     REACTION: myalgia  . Triaminic    Current Outpatient Prescriptions on File Prior to Visit  Medication Sig Dispense Refill  . cholecalciferol (VITAMIN D) 1000 UNITS tablet Take 1,000 Units by mouth daily.        . cyclobenzaprine (FLEXERIL) 10 MG tablet Take 10 mg by mouth 3 (three) times daily as needed for muscle spasms.      Marland Kitchen docusate sodium (COLACE) 100 MG capsule Take 100 mg by mouth 2 (two) times daily.      Marland Kitchen levothyroxine (SYNTHROID, LEVOTHROID) 112 MCG tablet Take 1 tablet (112 mcg total) by mouth daily before breakfast.  90 tablet  1  . olmesartan-hydrochlorothiazide (BENICAR HCT) 40-25 MG per tablet Take 1 tablet by mouth daily.      Marland Kitchen oxyCODONE-acetaminophen (PERCOCET/ROXICET) 5-325 MG per tablet Take 1 tablet by mouth every 4 (four) hours as needed for severe pain.  60 tablet  0  . Polyethylene Glycol 3350 (MIRALAX PO) Take by mouth.       No current facility-administered medications on file prior to visit.    Review of Systems Review of Systems  Constitutional: Negative for fever, appetite change, fatigue and unexpected weight change.  Eyes: Negative for pain and visual disturbance.  Respiratory:  Negative for cough and shortness of breath.   Cardiovascular: Negative for cp or palpitations    Gastrointestinal: Negative for nausea, diarrhea and pos for constipation /bloating and abd discomfort  Genitourinary: pos for urgency and frequency. pos for dysuria /neg for hematuria  Skin: Negative for pallor or rash   Neurological: Negative for weakness,  light-headedness, numbness and headaches.  Hematological: Negative for adenopathy. Does not bruise/bleed easily.  Psychiatric/Behavioral: Negative for dysphoric mood. The patient is not nervous/anxious.         Objective:   Physical Exam  Constitutional: She appears well-developed and well-nourished. No distress.  Frail appearing elderly female   HENT:  Head: Normocephalic and atraumatic.  Mouth/Throat: Oropharynx is clear and moist.  Eyes: Conjunctivae and EOM are normal. Pupils are equal, round, and reactive to light. No scleral icterus.  Neck: Normal range of motion. Neck supple.  Cardiovascular: Normal rate and regular rhythm.  Exam reveals no gallop.   Murmur heard. Pulmonary/Chest: Effort normal and breath sounds normal. No respiratory distress. She has no wheezes. She has no rales.  Abdominal: Soft. Bowel sounds are normal. She exhibits no distension and no mass. There is tenderness. There is no rebound and no guarding.  Mild suprapubic tenderness  No cva tenderness   Musculoskeletal: She exhibits no edema.  Lymphadenopathy:    She has no cervical adenopathy.  Neurological: She is alert. She has normal reflexes. No cranial nerve deficit. She exhibits normal muscle tone. Coordination normal.  Skin: Skin is warm and dry. No rash noted. No erythema. No pallor.  Psychiatric: She has a normal mood and affect.  Seems fatigued           Assessment & Plan:   Problem List Items Addressed This Visit     Digestive   Constipation     Worse lately on narcotic med  Will see if abx for uti helps (keflex) If not will use the  golytely she has  Rev recent labs from [facility]      Genitourinary   UTI (lower urinary tract infection)     Pt was tx with macrobid  ucx from [place] showed mixed flora Still symptomatic Need to put off mastectomy until we get this under control  Enc fluids     Relevant Medications      cephALEXin (KEFLEX) capsule   Other Relevant Orders      Urine culture    Other Visit Diagnoses   History of UTI    -  Primary    Relevant Orders       POCT urinalysis dipstick (Completed)

## 2013-10-09 ENCOUNTER — Telehealth: Payer: Self-pay | Admitting: Family Medicine

## 2013-10-09 LAB — URINE CULTURE
Colony Count: NO GROWTH
Organism ID, Bacteria: NO GROWTH

## 2013-10-09 NOTE — Telephone Encounter (Signed)
How is she feeling?

## 2013-10-09 NOTE — Telephone Encounter (Signed)
Pt called and said that she needs UA and Urine Culture results faxed to her doctor at [facility] (result note reflected that) but she also said she is bringing another urine sample by tomorrow and that those results need to be faxed to [facility] as well before Friday.  I told her if we were sending another urine culture off, that would take a couple of days and we would not be able to fax the results before Friday.  She said she did not know if it was for a UA or another culture but that Dr. Glori Bickers was aware.

## 2013-10-10 NOTE — Telephone Encounter (Signed)
Spoke with pt and advise her that her urine cx was neg. Pt said she isn't having any urinary sxs, but someone at [facility] called and told her that her surgery was cancelled due to her UTI, I advise pt that urine was clear and we are faxing over the results, pt said that she doesn't need to drop off another urine sample if her urine was clear she just needs [place] to fax over urine cx. I advise pt urine cx results are faxed and she will call [facility] to make sure that they received them and that her surgery is still on

## 2013-10-18 ENCOUNTER — Telehealth: Payer: Self-pay | Admitting: Family Medicine

## 2013-10-18 ENCOUNTER — Other Ambulatory Visit (INDEPENDENT_AMBULATORY_CARE_PROVIDER_SITE_OTHER): Payer: Medicare Other

## 2013-10-18 DIAGNOSIS — R300 Dysuria: Secondary | ICD-10-CM

## 2013-10-18 DIAGNOSIS — R8290 Unspecified abnormal findings in urine: Secondary | ICD-10-CM

## 2013-10-18 LAB — POCT URINALYSIS DIPSTICK
Bilirubin, UA: NEGATIVE
Glucose, UA: NEGATIVE
Ketones, UA: NEGATIVE
Nitrite, UA: NEGATIVE
Protein, UA: NEGATIVE
Spec Grav, UA: 1.015
Urobilinogen, UA: 0.2
pH, UA: 6.5

## 2013-10-18 NOTE — Telephone Encounter (Signed)
Please have her drop off sample and send for cx : dx recurrent uti  Thanks

## 2013-10-18 NOTE — Telephone Encounter (Signed)
Pt called stated she had surgery on 10/15/13 and still is not feeling well. She thinks she has another uti.  She is feeling flushed and her energy level is low.  She would like to bring in an urine sample to be retest for uti  Please advise

## 2013-10-18 NOTE — Telephone Encounter (Signed)
Pt will drop off urine sample for cx today

## 2013-10-22 ENCOUNTER — Telehealth: Payer: Self-pay | Admitting: Family Medicine

## 2013-10-22 LAB — URINE CULTURE: Colony Count: 75000

## 2013-10-22 MED ORDER — CIPROFLOXACIN HCL 250 MG PO TABS: 250.0000 mg | ORAL_TABLET | Freq: Two times a day (BID) | ORAL | Status: DC

## 2013-10-22 NOTE — Telephone Encounter (Signed)
I sent cipro to her pharmacy She has taken it before  I need to send another urine for culture in a week when she has completed therapy  I hope she is recovering well from surgery

## 2013-10-22 NOTE — Telephone Encounter (Signed)
Pt notified to leave urine sample in 1 week after finishing abx, and advised pt that Rx sent to pharmacy

## 2013-10-22 NOTE — Telephone Encounter (Signed)
Her urine culture is positive for e coli this time and we need to treat it  She cannot take sulfa and her last abx was keflex  Please ask her if she can take cipro or levaquin (if she is aware)  Let me know and I will call in a med

## 2013-10-22 NOTE — Telephone Encounter (Signed)
Pt notified of urine cx results and Dr. Marliss Coots comments. Pt wasn't sure if she could take cipro or levaquin but she said cipro "rings a bell" and she would be willing to try the cipro, pt using [facility] for pharmacy

## 2013-10-22 NOTE — Telephone Encounter (Signed)
Left message with spouse requesting pt to call office back once she returns

## 2013-10-29 ENCOUNTER — Other Ambulatory Visit (INDEPENDENT_AMBULATORY_CARE_PROVIDER_SITE_OTHER): Payer: Medicare Other

## 2013-10-29 DIAGNOSIS — R8290 Unspecified abnormal findings in urine: Secondary | ICD-10-CM

## 2013-10-29 DIAGNOSIS — Z87440 Personal history of urinary (tract) infections: Secondary | ICD-10-CM

## 2013-10-29 LAB — POCT URINALYSIS DIPSTICK
Bilirubin, UA: NEGATIVE
Glucose, UA: NEGATIVE
Ketones, UA: NEGATIVE
Nitrite, UA: NEGATIVE
Spec Grav, UA: 1.015
Urobilinogen, UA: 0.2
pH, UA: 6

## 2013-10-29 NOTE — Telephone Encounter (Signed)
Some wbc in urine (see report)- let her know we sent it for culture and will update with a result

## 2013-10-29 NOTE — Telephone Encounter (Signed)
Pt left v/m; pt dropped off urine specimen; pt finished Cipro and wants to know if UTI is cleared up; pt is to have surgery next week. Pt said if any further questions call pt at [phone].

## 2013-10-29 NOTE — Telephone Encounter (Signed)
Pt.notified

## 2013-10-31 LAB — URINE CULTURE
Colony Count: NO GROWTH
Organism ID, Bacteria: NO GROWTH

## 2013-12-17 ENCOUNTER — Encounter: Payer: Self-pay | Admitting: Family Medicine

## 2013-12-17 ENCOUNTER — Ambulatory Visit (INDEPENDENT_AMBULATORY_CARE_PROVIDER_SITE_OTHER): Payer: Medicare Other | Admitting: Family Medicine

## 2013-12-17 VITALS — BP 150/70 | HR 91 | Temp 98.4°F | Ht 62.0 in | Wt 144.5 lb

## 2013-12-17 DIAGNOSIS — M545 Low back pain, unspecified: Secondary | ICD-10-CM

## 2013-12-17 DIAGNOSIS — N390 Urinary tract infection, site not specified: Secondary | ICD-10-CM

## 2013-12-17 DIAGNOSIS — M5440 Lumbago with sciatica, unspecified side: Secondary | ICD-10-CM

## 2013-12-17 DIAGNOSIS — Z853 Personal history of malignant neoplasm of breast: Secondary | ICD-10-CM

## 2013-12-17 DIAGNOSIS — Z23 Encounter for immunization: Secondary | ICD-10-CM

## 2013-12-17 LAB — POCT URINALYSIS DIPSTICK
Bilirubin, UA: NEGATIVE
Glucose, UA: NEGATIVE
Ketones, UA: NEGATIVE
Nitrite, UA: NEGATIVE
Protein, UA: NEGATIVE
Spec Grav, UA: 1.01
Urobilinogen, UA: 0.2
pH, UA: 6

## 2013-12-17 MED ORDER — OXYCODONE-ACETAMINOPHEN 5-325 MG PO TABS: 1.0000 | ORAL_TABLET | Freq: Every evening | ORAL | Status: DC | PRN

## 2013-12-17 MED ORDER — CIPROFLOXACIN HCL 250 MG PO TABS: 250.0000 mg | ORAL_TABLET | Freq: Two times a day (BID) | ORAL | Status: DC

## 2013-12-17 NOTE — Progress Notes (Signed)
Subjective:    Patient ID: Dawn Aguirre, female    DOB: 1925-11-09, 80 y.o.   MRN: [id number]  HPI Here for f/u or urinary problems  Low back pain -"dull and heavy"  Flu like symptoms  occ stabbing pain in mid back on the left side   Generally feels low energy  Easily light headed and her balance worsens with age  Some dysuria (warm feeling)  Takes a lot of energy for ADL  Also achey all over  No fever in the office today   Chronic back pain  Did see a spine specialist -not a lot of help (spinal stenosis from deg disc dz)   Had 2 breast surgeries since last visit  2nd one for LN - all came back normal /no cancer  Had a tough time with anesthesia the 2nd time and more pain  Recommended going for radiation - but given her age she declines - will continue to follow  Needs a re check in 6 months   She is very pleased with care at [facility]   She has percocet for post op pain - uses at night to help sleep Tylenol does not help     Results for orders placed or performed in visit on 12/17/13  POCT urinalysis dipstick  Result Value Ref Range   Color, UA yellow    Clarity, UA hazy    Glucose, UA neg.    Bilirubin, UA neg.    Ketones, UA neg.    Spec Grav, UA 1.010    Blood, UA Moderate    pH, UA 6.0    Protein, UA neg.    Urobilinogen, UA 0.2    Nitrite, UA neg.    Leukocytes, UA moderate (2+)     9/22 cx neg  One before that was pos  She does have more bladder prolapse lately also      Chemistry      Component Value Date/Time   NA 139 05/30/2013 1200   K 4.1 05/30/2013 1200   CL 101 05/30/2013 1200   CO2 26 05/30/2013 1200   BUN 25* 05/30/2013 1200   CREATININE 0.94 05/30/2013 1200      Component Value Date/Time   CALCIUM 9.4 05/30/2013 1200   ALKPHOS 66 02/17/2013 1234   AST 20 02/17/2013 1234   ALT 21 02/17/2013 1234   BILITOT 0.4 02/17/2013 1234       Patient Active Problem List   Diagnosis Date Noted  . History of breast cancer 12/18/2013  .  Breast discharge 06/07/2013  . Left breast mass 04/03/2013  . Bloody discharge from nipple 03/11/2013  . Cerumen impaction 03/11/2013  . Thrombocytopenia, unspecified 02/22/2013  . Epiploic appendagitis 02/22/2013  . Abdominal pain, left lower quadrant 02/22/2013  . Dry mouth 02/04/2013  . Acute sinusitis 12/25/2012  . Chest wall pain 12/25/2012  . Benign paroxysmal positional vertigo 09/14/2012  . Low back pain 09/07/2012  . Abnormal urinalysis 09/07/2012  . Constipation 11/04/2011  . UTI (lower urinary tract infection) 06/27/2011  . Aortic stenosis 08/31/2010  . Hyperlipidemia   . GASTRITIS 04/01/2009  . ABDOMINAL BLOATING 04/01/2009  . STRESS REACTION, ACUTE, WITH EMOTIONAL DISTURBANCE 10/12/2007  . [place] DISEASE, CERVICAL 10/12/2007  . HYPOTHYROIDISM 01/22/2007  . RAYNAUD'S SYNDROME 01/22/2007  . EXTERNAL HEMORRHOIDS 01/22/2007  . COPD 01/22/2007  . DIVERTICULOSIS, COLON 01/22/2007  . FIBROCYSTIC BREAST DISEASE 01/22/2007  . OSTEOPOROSIS 01/22/2007  . HYPERLIPIDEMIA 12/16/2006  . HYPERTENSION 12/16/2006  . GERD 12/16/2006  .  RHEUMATOID ARTHRITIS 12/16/2006  . DYSPNEA ON EXERTION 12/16/2006  . COUGH, CHRONIC 12/16/2006  . TOBACCO ABUSE, HX OF 12/16/2006  . HYSTERECTOMY, HX OF 12/16/2006   Past Medical History  Diagnosis Date  . Flatulence, eructation, and gas pain   . Chronic airway obstruction, not elsewhere classified   . Cough   . Degeneration of cervical intervertebral disc   . Diverticulosis of colon (without mention of hemorrhage)   . Other dyspnea and respiratory abnormality   . External hemorrhoids without mention of complication   . Diffuse cystic mastopathy   . Unspecified gastritis and gastroduodenitis without mention of hemorrhage   . Esophageal reflux   . Other and unspecified hyperlipidemia   . Unspecified essential hypertension   . Unspecified hypothyroidism   . Acquired absence of organ, genital organs   . Osteoporosis, unspecified   . Other  acute sinusitis   . Raynaud's syndrome   . Rheumatoid arthritis(714.0)     Sero-negative  . Predominant disturbance of emotions   . Personal history of tobacco use, presenting hazards to health   . Lichen sclerosus     back/abd/vulvar  . Spinal stenosis   . Colitis, ischemic 07/2000    ? infect  . Mastoiditis of right side 04/2005    admitted  . Fibula fracture 2006  . Hyperlipidemia   . Wears dentures     top  . Breast cancer     surgery at [facility]   Past Surgical History  Procedure Laterality Date  . Abdominal hysterectomy  1968  . Dexa  2001    OP,heel  . Cardiollite  02/2002    Neg  . Nasal sinus surgery  05/2002  . Dexa      OP hip T-2.94  . US echocardiography  10/06    normal LVF EF 55-65%  . Nuclear stress test  11/07    Normal  . Tonsillectomy    . Colonoscopy     History  Substance Use Topics  . Smoking status: Former Smoker    Quit date: 02/07/1962  . Smokeless tobacco: Not on file  . Alcohol Use: Yes     Comment: occ wine   Family History  Problem Relation Age of Onset  . Heart attack Father 55  . Coronary artery disease Sister 22    CABG  . Heart attack Brother 25  . Coronary artery disease      GF   Allergies  Allergen Reactions  . Ace Inhibitors     REACTION: cough  . Ezetimibe     REACTION: myalgia  . Ezetimibe-Simvastatin     REACTION: joints swell  . Moxifloxacin   . Rosuvastatin     REACTION: myalgia  . Septra [Sulfamethoxazole-Trimethoprim]     Sick feeling   . Simvastatin     REACTION: myalgia  . Triaminic    Current Outpatient Prescriptions on File Prior to Visit  Medication Sig Dispense Refill  . cholecalciferol (VITAMIN D) 1000 UNITS tablet Take 1,000 Units by mouth daily.      . cyclobenzaprine (FLEXERIL) 10 MG tablet Take 10 mg by mouth 3 (three) times daily as needed for muscle spasms.    Marland Kitchen docusate sodium (COLACE) 100 MG capsule Take 100 mg by mouth 2 (two) times daily.    Marland Kitchen levothyroxine (SYNTHROID, LEVOTHROID) 112 MCG  tablet Take 1 tablet (112 mcg total) by mouth daily before breakfast. 90 tablet 1  . olmesartan-hydrochlorothiazide (BENICAR HCT) 40-25 MG per tablet Take 1 tablet by mouth  daily.    . Polyethylene Glycol 3350 (MIRALAX PO) Take by mouth.     No current facility-administered medications on file prior to visit.    Review of Systems Review of Systems  Constitutional: Negative for fever, appetite change,  and unexpected weight change. pos for fatigue and dec in endurance  Eyes: Negative for pain and visual disturbance.  Respiratory: Negative for cough and shortness of breath.   Cardiovascular: Negative for cp or palpitations    Gastrointestinal: Negative for nausea, diarrhea and constipation.  Genitourinary: pos  for urgency and frequency. pos for dysuria/ neg for hematuria/ pos for L flank pain  Skin: Negative for pallor or rash   MSK pos for LS pain  Neurological: Negative for weakness, light-headedness, numbness and headaches.  Hematological: Negative for adenopathy. Does not bruise/bleed easily.  Psychiatric/Behavioral: Negative for dysphoric mood. The patient is not nervous/anxious.         Objective:   Physical Exam  Constitutional: She appears well-developed and well-nourished. No distress.  HENT:  Head: Normocephalic and atraumatic.  Mouth/Throat: Oropharynx is clear and moist.  Eyes: Conjunctivae and EOM are normal. Pupils are equal, round, and reactive to light. Right eye exhibits no discharge. Left eye exhibits no discharge. No scleral icterus.  Neck: Normal range of motion. Neck supple. No JVD present. Carotid bruit is not present. No thyromegaly present.  Cardiovascular: Normal rate, regular rhythm and intact distal pulses.   Murmur heard. Pulmonary/Chest: Effort normal and breath sounds normal. No respiratory distress. She has no wheezes. She has no rales.  Abdominal: Soft. Bowel sounds are normal. She exhibits no distension and no mass. There is no tenderness. There is no  rebound and no guarding.  No cva tenderness  Musculoskeletal: She exhibits tenderness. She exhibits no edema.  Limited LS rom  Tender over lower LS   Lymphadenopathy:    She has no cervical adenopathy.  Neurological: She is alert. She has normal reflexes.  Skin: Skin is warm and dry. No rash noted. No erythema. No pallor.  Psychiatric: She has a normal mood and affect.  Seems fatigued  Has a very positive attitude          Assessment & Plan:   Problem List Items Addressed This Visit      Genitourinary   UTI (lower urinary tract infection)    ua is pos again with some voiding symptoms  tx with cipro cx pending  If neg-consider ref to urology    Relevant Medications      ciprofloxacin (CIPRO) tablet   Other Relevant Orders      Urine culture     Other   History of breast cancer   Low back pain    Ongoing/chronic with spinal stenosis  Also some L flank pain - poss rel to uti Given px for 5-325 percocet to use at night with caution-has helped She is also getting more help around the house  Enc return to spine specialist if she thought it was helpful    Relevant Medications      oxyCODONE-acetaminophen (PERCOCET/ROXICET) 5-325 MG per tablet    Other Visit Diagnoses    Low back pain, non-specific    -  Primary    Relevant Medications       oxyCODONE-acetaminophen (PERCOCET/ROXICET) 5-325 MG per tablet    Other Relevant Orders       POCT urinalysis dipstick (Completed)    Need for influenza vaccination        Relevant Orders  Flu Vaccine QUAD 36+ mos IM (Completed)

## 2013-12-17 NOTE — Progress Notes (Signed)
Pre visit review using our clinic review tool, if applicable. No additional management support is needed unless otherwise documented below in the visit note. 

## 2013-12-17 NOTE — Patient Instructions (Addendum)
Drink lots of water - make a big effort  Take the cipro as directed -if any problems with it let me know Pending urine culture-we will contact you  Flu shot today  Get some rest - get as much help as you can so you can recover  Use percocet at night if needed only if necessary  Get a massage if helpful Update me if no improvement

## 2013-12-18 ENCOUNTER — Encounter: Payer: Self-pay | Admitting: Family Medicine

## 2013-12-18 DIAGNOSIS — Z853 Personal history of malignant neoplasm of breast: Secondary | ICD-10-CM | POA: Insufficient documentation

## 2013-12-18 NOTE — Assessment & Plan Note (Signed)
Ongoing/chronic with spinal stenosis  Also some L flank pain - poss rel to uti Given px for 5-325 percocet to use at night with caution-has helped She is also getting more help around the house  Enc return to spine specialist if she thought it was helpful

## 2013-12-18 NOTE — Assessment & Plan Note (Signed)
ua is pos again with some voiding symptoms  tx with cipro cx pending  If neg-consider ref to urology

## 2013-12-19 ENCOUNTER — Telehealth: Payer: Self-pay | Admitting: Family Medicine

## 2013-12-19 DIAGNOSIS — R8290 Unspecified abnormal findings in urine: Secondary | ICD-10-CM

## 2013-12-19 DIAGNOSIS — R300 Dysuria: Secondary | ICD-10-CM

## 2013-12-19 LAB — URINE CULTURE
Colony Count: NO GROWTH
Organism ID, Bacteria: NO GROWTH

## 2013-12-19 NOTE — Telephone Encounter (Signed)
See note below re: urology referral

## 2013-12-19 NOTE — Telephone Encounter (Signed)
-----   Message from Tammi Sou, [place] sent at 12/19/2013  4:59 PM EST ----- Pt notified of urine cx results pt does want to see a urologist but she would like a few days to research who she would like to see. Pt said we can put referral in but she would like a call from [facility] to let them know where she wants to go before they schedule her an appt

## 2013-12-24 ENCOUNTER — Ambulatory Visit (INDEPENDENT_AMBULATORY_CARE_PROVIDER_SITE_OTHER)
Admission: RE | Admit: 2013-12-24 | Discharge: 2013-12-24 | Disposition: A | Discharge status: Home / Self Care | Payer: Medicare Other | Source: Ambulatory Visit | Attending: Family Medicine | Admitting: Family Medicine

## 2013-12-24 ENCOUNTER — Encounter: Payer: Self-pay | Admitting: Family Medicine

## 2013-12-24 ENCOUNTER — Ambulatory Visit (INDEPENDENT_AMBULATORY_CARE_PROVIDER_SITE_OTHER): Payer: Medicare Other | Admitting: Family Medicine

## 2013-12-24 VITALS — BP 122/74 | HR 80 | Temp 98.3°F | Ht 62.0 in | Wt 145.0 lb

## 2013-12-24 DIAGNOSIS — Y92009 Unspecified place in unspecified non-institutional (private) residence as the place of occurrence of the external cause: Secondary | ICD-10-CM

## 2013-12-24 DIAGNOSIS — W19XXXA Unspecified fall, initial encounter: Secondary | ICD-10-CM

## 2013-12-24 DIAGNOSIS — M5441 Lumbago with sciatica, right side: Secondary | ICD-10-CM

## 2013-12-24 DIAGNOSIS — Y92099 Unspecified place in other non-institutional residence as the place of occurrence of the external cause: Secondary | ICD-10-CM

## 2013-12-24 DIAGNOSIS — R0781 Pleurodynia: Secondary | ICD-10-CM

## 2013-12-24 NOTE — Assessment & Plan Note (Signed)
inj chest wall and back  Long disc re: fall prevention  Will use walker as needed Take up rugs  Not work in garage

## 2013-12-24 NOTE — Progress Notes (Signed)
Subjective:    Patient ID: Dawn Aguirre, female    DOB: 1925/12/06, 80 y.o.   MRN: [id number]  HPI Here for f/u of a fall   She fell in the garage- tripped on a pc of rug rolled out  (the end of it) and fell on a carpet roll  Her family could not get her up - finally her care aid got her up  She was very shaken  She caught herself with R arm and covered her face - keeping face from being injured    Pain on her R side  Ribs/side/back  Is taking percocet for pain even though she does not like to take it  Area is tender to the touch  Can feel it to take a deep breath   Patient Active Problem List   Diagnosis Date Noted  . Dysuria 12/19/2013  . History of breast cancer 12/18/2013  . Breast discharge 06/07/2013  . Left breast mass 04/03/2013  . Bloody discharge from nipple 03/11/2013  . Cerumen impaction 03/11/2013  . Thrombocytopenia, unspecified 02/22/2013  . Epiploic appendagitis 02/22/2013  . Abdominal pain, left lower quadrant 02/22/2013  . Dry mouth 02/04/2013  . Acute sinusitis 12/25/2012  . Chest wall pain 12/25/2012  . Benign paroxysmal positional vertigo 09/14/2012  . Low back pain 09/07/2012  . Abnormal urinalysis 09/07/2012  . Constipation 11/04/2011  . UTI (lower urinary tract infection) 06/27/2011  . Aortic stenosis 08/31/2010  . Hyperlipidemia   . GASTRITIS 04/01/2009  . ABDOMINAL BLOATING 04/01/2009  . STRESS REACTION, ACUTE, WITH EMOTIONAL DISTURBANCE 10/12/2007  . [place] DISEASE, CERVICAL 10/12/2007  . HYPOTHYROIDISM 01/22/2007  . RAYNAUD'S SYNDROME 01/22/2007  . EXTERNAL HEMORRHOIDS 01/22/2007  . COPD 01/22/2007  . DIVERTICULOSIS, COLON 01/22/2007  . FIBROCYSTIC BREAST DISEASE 01/22/2007  . OSTEOPOROSIS 01/22/2007  . HYPERLIPIDEMIA 12/16/2006  . HYPERTENSION 12/16/2006  . GERD 12/16/2006  . RHEUMATOID ARTHRITIS 12/16/2006  . DYSPNEA ON EXERTION 12/16/2006  . COUGH, CHRONIC 12/16/2006  . TOBACCO ABUSE, HX OF 12/16/2006  . HYSTERECTOMY, HX OF  12/16/2006   Past Medical History  Diagnosis Date  . Flatulence, eructation, and gas pain   . Chronic airway obstruction, not elsewhere classified   . Cough   . Degeneration of cervical intervertebral disc   . Diverticulosis of colon (without mention of hemorrhage)   . Other dyspnea and respiratory abnormality   . External hemorrhoids without mention of complication   . Diffuse cystic mastopathy   . Unspecified gastritis and gastroduodenitis without mention of hemorrhage   . Esophageal reflux   . Other and unspecified hyperlipidemia   . Unspecified essential hypertension   . Unspecified hypothyroidism   . Acquired absence of organ, genital organs   . Osteoporosis, unspecified   . Other acute sinusitis   . Raynaud's syndrome   . Rheumatoid arthritis(714.0)     Sero-negative  . Predominant disturbance of emotions   . Personal history of tobacco use, presenting hazards to health   . Lichen sclerosus     back/abd/vulvar  . Spinal stenosis   . Colitis, ischemic 07/2000    ? infect  . Mastoiditis of right side 04/2005    admitted  . Fibula fracture 2006  . Hyperlipidemia   . Wears dentures     top  . Breast cancer     surgery at [facility]   Past Surgical History  Procedure Laterality Date  . Abdominal hysterectomy  1968  . Dexa  2001    OP,heel  . Cardiollite  02/2002    Neg  . Nasal sinus surgery  05/2002  . Dexa      OP hip T-2.94  . US echocardiography  10/06    normal LVF EF 55-65%  . Nuclear stress test  11/07    Normal  . Tonsillectomy    . Colonoscopy     History  Substance Use Topics  . Smoking status: Former Smoker    Quit date: 02/07/1962  . Smokeless tobacco: Not on file  . Alcohol Use: Yes     Comment: occ wine   Family History  Problem Relation Age of Onset  . Heart attack Father 16  . Coronary artery disease Sister 54    CABG  . Heart attack Brother 81  . Coronary artery disease      GF   Allergies  Allergen Reactions  . Ace Inhibitors      REACTION: cough  . Ezetimibe     REACTION: myalgia  . Ezetimibe-Simvastatin     REACTION: joints swell  . Moxifloxacin   . Rosuvastatin     REACTION: myalgia  . Septra [Sulfamethoxazole-Trimethoprim]     Sick feeling   . Simvastatin     REACTION: myalgia  . Triaminic    Current Outpatient Prescriptions on File Prior to Visit  Medication Sig Dispense Refill  . cholecalciferol (VITAMIN D) 1000 UNITS tablet Take 1,000 Units by mouth daily.      . ciprofloxacin (CIPRO) 250 MG tablet Take 1 tablet (250 mg total) by mouth 2 (two) times daily. 10 tablet 0  . cyclobenzaprine (FLEXERIL) 10 MG tablet Take 10 mg by mouth 3 (three) times daily as needed for muscle spasms.    Marland Kitchen docusate sodium (COLACE) 100 MG capsule Take 100 mg by mouth 2 (two) times daily.    Marland Kitchen levothyroxine (SYNTHROID, LEVOTHROID) 112 MCG tablet Take 1 tablet (112 mcg total) by mouth daily before breakfast. 90 tablet 1  . olmesartan-hydrochlorothiazide (BENICAR HCT) 40-25 MG per tablet Take 1 tablet by mouth daily.    Marland Kitchen oxyCODONE-acetaminophen (PERCOCET/ROXICET) 5-325 MG per tablet Take 1 tablet by mouth at bedtime as needed for moderate pain or severe pain. 30 tablet 0  . Polyethylene Glycol 3350 (MIRALAX PO) Take by mouth.    . Probiotic Product (ALIGN PO) Take 1 tablet by mouth daily.     No current facility-administered medications on file prior to visit.      Review of Systems    Review of Systems  Constitutional: Negative for fever, appetite change, fatigue and unexpected weight change.  Eyes: Negative for pain and visual disturbance.  Respiratory: Negative for cough and shortness of breath.   Cardiovascular: Negative for cp or palpitations    Gastrointestinal: Negative for nausea, diarrhea and constipation.  Genitourinary: Negative for urgency and frequency.  Skin: Negative for pallor or rash  MSK pos for back pain and R rib/side pain   Neurological: Negative for weakness, light-headedness, numbness and  headaches.  Hematological: Negative for adenopathy. Does not bruise/bleed easily.  Psychiatric/Behavioral: Negative for dysphoric mood. The patient is not nervous/anxious.      Objective:   Physical Exam  Constitutional: She appears well-developed and well-nourished. No distress.  HENT:  Head: Normocephalic and atraumatic.  Mouth/Throat: Oropharynx is clear and moist.  Eyes: Conjunctivae and EOM are normal. Pupils are equal, round, and reactive to light. No scleral icterus.  Neck: Normal range of motion. Neck supple.  Cardiovascular: Normal rate and regular rhythm.   Pulmonary/Chest: Effort normal and breath  sounds normal. No respiratory distress. She has no wheezes. She has no rales. She exhibits tenderness.  R lateral chest wall tenderness No skin change or crepitus    Abdominal: Soft. Bowel sounds are normal. She exhibits no distension and no mass. There is no tenderness.  Musculoskeletal: She exhibits no edema.  Tender in R lumbar musculature  Mild LS tenderness  Lymphadenopathy:    She has no cervical adenopathy.  Neurological: She is alert. She has normal reflexes.  Skin: Skin is warm and dry. No rash noted. No erythema. No pallor.  Psychiatric: She has a normal mood and affect.          Assessment & Plan:   Problem List Items Addressed This Visit      Other   Fall in home    inj chest wall and back  Long disc re: fall prevention  Will use walker as needed Take up rugs  Not work in garage     Low back pain - Primary    Worse after a fall - in pt who already has spinal stenosis See eval for rib pain  xr to r/o new comp fx     Relevant Orders      DG Lumbar Spine 2-3 Views (Completed)   Rib pain on right side    After a fall - post R ribs and LS  Xray today No sob or red flags Disc rib contusions and fx  Has percocet for pain/ walker/ knows to use caution  Long disc re: fall risk and prevention as well  Disc use of heat  Disc need for deep breaths to  prevent atelectasis and pneumonia     Relevant Orders      DG Ribs Unilateral Right (Completed)

## 2013-12-24 NOTE — Assessment & Plan Note (Signed)
Worse after a fall - in pt who already has spinal stenosis See eval for rib pain  xr to r/o new comp fx

## 2013-12-24 NOTE — Progress Notes (Signed)
Pre visit review using our clinic review tool, if applicable. No additional management support is needed unless otherwise documented below in the visit note. 

## 2013-12-24 NOTE — Patient Instructions (Signed)
xrays today  Take percocet with great caution if needed  Use a walker if needed for balance and for support  Use heat on the painful area 10 minutes at a time  Make sure to take deep breaths at least every 30 minutes (grab a pillow and take a deep breath) - to prevent pneumonia  If you develop cough or fever let me know     Rib Contusion A rib contusion (bruise) can occur by a blow to the chest or by a fall against a hard object. Usually these will be much better in a couple weeks. If X-rays were taken today and there are no broken bones (fractures), the diagnosis of bruising is made. However, broken ribs may not show up for several days, or may be discovered later on a routine X-ray when signs of healing show up. If this happens to you, it does not mean that something was missed on the X-ray, but simply that it did not show up on the first X-rays. Earlier diagnosis will not usually change the treatment. HOME CARE INSTRUCTIONS   Avoid strenuous activity. Be careful during activities and avoid bumping the injured ribs. Activities that pull on the injured ribs and cause pain should be avoided, if possible.  For the first day or two, an ice pack used every 20 minutes while awake may be helpful. Put ice in a plastic bag and put a towel between the bag and the skin.  Eat a normal, well-balanced diet. Drink plenty of fluids to avoid constipation.  Take deep breaths several times a day to keep lungs free of infection. Try to cough several times a day. Splint the injured area with a pillow while coughing to ease pain. Coughing can help prevent pneumonia.  Wear a rib belt or binder only if told to do so by your caregiver. If you are wearing a rib belt or binder, you must do the breathing exercises as directed by your caregiver. If not used properly, rib belts or binders restrict breathing which can lead to pneumonia.  Only take over-the-counter or prescription medicines for pain, discomfort, or fever  as directed by your caregiver. SEEK MEDICAL CARE IF:   You or your child has an oral temperature above 102 F (38.9 C).  Your baby is older than 3 months with a rectal temperature of 100.5 F (38.1 C) or higher for more than 1 day.  You develop a cough, with thick or bloody sputum. SEEK IMMEDIATE MEDICAL CARE IF:   You have difficulty breathing.  You feel sick to your stomach (nausea), have vomiting or belly (abdominal) pain.  You have worsening pain, not controlled with medications, or there is a change in the location of the pain.  You develop sweating or radiation of the pain into the arms, jaw or shoulders, or become light headed or faint.  You or your child has an oral temperature above 102 F (38.9 C), not controlled by medicine.  Your or your baby is older than 3 months with a rectal temperature of 102 F (38.9 C) or higher.  Your baby is 71 months old or younger with a rectal temperature of 100.4 F (38 C) or higher. MAKE SURE YOU:   Understand these instructions.  Will watch your condition.  Will get help right away if you are not doing well or get worse. Document Released: 10/19/2000 Document Revised: 05/21/2012 Document Reviewed: 09/12/2007 [facility] Patient Information 2015 [place], [place]. This information is not intended to replace advice given  to you by your health care provider. Make sure you discuss any questions you have with your health care provider. Rib Contusion A rib contusion (bruise) can occur by a blow to the chest or by a fall against a hard object. Usually these will be much better in a couple weeks. If X-rays were taken today and there are no broken bones (fractures), the diagnosis of bruising is made. However, broken ribs may not show up for several days, or may be discovered later on a routine X-ray when signs of healing show up. If this happens to you, it does not mean that something was missed on the X-ray, but simply that it did not show up on the  first X-rays. Earlier diagnosis will not usually change the treatment. HOME CARE INSTRUCTIONS   Avoid strenuous activity. Be careful during activities and avoid bumping the injured ribs. Activities that pull on the injured ribs and cause pain should be avoided, if possible.  For the first day or two, an ice pack used every 20 minutes while awake may be helpful. Put ice in a plastic bag and put a towel between the bag and the skin.  Eat a normal, well-balanced diet. Drink plenty of fluids to avoid constipation.  Take deep breaths several times a day to keep lungs free of infection. Try to cough several times a day. Splint the injured area with a pillow while coughing to ease pain. Coughing can help prevent pneumonia.  Wear a rib belt or binder only if told to do so by your caregiver. If you are wearing a rib belt or binder, you must do the breathing exercises as directed by your caregiver. If not used properly, rib belts or binders restrict breathing which can lead to pneumonia.  Only take over-the-counter or prescription medicines for pain, discomfort, or fever as directed by your caregiver. SEEK MEDICAL CARE IF:   You or your child has an oral temperature above 102 F (38.9 C).  Your baby is older than 3 months with a rectal temperature of 100.5 F (38.1 C) or higher for more than 1 day.  You develop a cough, with thick or bloody sputum. SEEK IMMEDIATE MEDICAL CARE IF:   You have difficulty breathing.  You feel sick to your stomach (nausea), have vomiting or belly (abdominal) pain.  You have worsening pain, not controlled with medications, or there is a change in the location of the pain.  You develop sweating or radiation of the pain into the arms, jaw or shoulders, or become light headed or faint.  You or your child has an oral temperature above 102 F (38.9 C), not controlled by medicine.  Your or your baby is older than 3 months with a rectal temperature of 102 F (38.9 C)  or higher.  Your baby is 23 months old or younger with a rectal temperature of 100.4 F (38 C) or higher. MAKE SURE YOU:   Understand these instructions.  Will watch your condition.  Will get help right away if you are not doing well or get worse. Document Released: 10/19/2000 Document Revised: 05/21/2012 Document Reviewed: 09/12/2007 [facility] Patient Information 2015 [place], [place]. This information is not intended to replace advice given to you by your health care provider. Make sure you discuss any questions you have with your health care provider.

## 2013-12-24 NOTE — Assessment & Plan Note (Signed)
After a fall - post R ribs and LS  Xray today No sob or red flags Disc rib contusions and fx  Has percocet for pain/ walker/ knows to use caution  Long disc re: fall risk and prevention as well  Disc use of heat  Disc need for deep breaths to prevent atelectasis and pneumonia

## 2014-01-13 ENCOUNTER — Ambulatory Visit (INDEPENDENT_AMBULATORY_CARE_PROVIDER_SITE_OTHER): Payer: Medicare Other | Admitting: Family Medicine

## 2014-01-13 ENCOUNTER — Encounter: Payer: Self-pay | Admitting: Family Medicine

## 2014-01-13 VITALS — BP 136/78 | HR 65 | Temp 98.3°F | Ht 62.0 in | Wt 144.2 lb

## 2014-01-13 DIAGNOSIS — E785 Hyperlipidemia, unspecified: Secondary | ICD-10-CM

## 2014-01-13 DIAGNOSIS — Z0000 Encounter for general adult medical examination without abnormal findings: Secondary | ICD-10-CM | POA: Insufficient documentation

## 2014-01-13 DIAGNOSIS — R0781 Pleurodynia: Secondary | ICD-10-CM

## 2014-01-13 DIAGNOSIS — E039 Hypothyroidism, unspecified: Secondary | ICD-10-CM

## 2014-01-13 DIAGNOSIS — M810 Age-related osteoporosis without current pathological fracture: Secondary | ICD-10-CM

## 2014-01-13 DIAGNOSIS — I10 Essential (primary) hypertension: Secondary | ICD-10-CM

## 2014-01-13 DIAGNOSIS — Z23 Encounter for immunization: Secondary | ICD-10-CM

## 2014-01-13 NOTE — Assessment & Plan Note (Signed)
Will check D level with next lab profile Has had a fall but no fx Disc need for calcium/ vitamin D/ wt bearing exercise and bone density test every 2 y to monitor Disc safety/ fracture risk in detail   Pt declines further bone density tests or tx at this time

## 2014-01-13 NOTE — Assessment & Plan Note (Signed)
Reviewed health habits including diet and exercise and skin cancer prevention Reviewed appropriate screening tests for age  Also reviewed health mt list, fam hx and immunization status , as well as social and family history   See HPI  prevnar vaccine today

## 2014-01-13 NOTE — Assessment & Plan Note (Signed)
Diet controlled Lipid draw planned Rev low sat fat diet

## 2014-01-13 NOTE — Progress Notes (Signed)
Subjective:    Patient ID: Dawn Aguirre, female    DOB: [birth year], 80 y.o.   MRN: [id number]  HPI Here for annual medicare wellness visit along with acute and chronic medical problems  I have personally reviewed the Medicare Annual Wellness questionnaire and have noted 1. The patient's medical and social history 2. Their use of alcohol, tobacco or illicit drugs 3. Their current medications and supplements 4. The patient's functional ability including ADL's, fall risks, home safety risks and hearing or visual             impairment. 5. Diet and physical activities 6. Evidence for depression or mood disorders  The patients weight, height, BMI have been recorded in the chart and visual acuity is per eye clinic.  I have made referrals, counseling and provided education to the patient based review of the above and I have provided the pt with a written personalized care plan for preventive services.  Is stressed this am and exhausted  Trying to fill out her medicare form   See scanned forms.  Routine anticipatory guidance given to patient.  See health maintenance. Colon cancer screening 6/04 - diverticulosis = was told that she needs to have a colonoscopy (Dr Tammi Klippel) Breast cancer screening- had mastectomy -has healed well/ doing ok overall / not a lot of pain - will have breast screening in 6 month  Self breast exam-nothing different overall -no lumps  Flu vaccine 11/15 Tetanus vaccine -unsure when last one was (she may not want it due to age) Pneumovax had about 20 years ago (was over 35)- is interested in prevnar  Zoster vaccine-not interested in it right now   Advance directive- she has that drawn up  Cognitive function addressed- see scanned forms- and if abnormal then additional documentation follows. - she is doing well overall with memory   Has had one fall with a rib injury recently - it was a fluke and she feels her balance is ok   Vision screen = abnormal (L eye is  not good)- will f/u with opthy (will likely have cataract surgery)  PMH and SH reviewed  Meds, vitals, and allergies reviewed.   ROS: See HPI.  Otherwise negative.      bp is stable today  No cp or palpitations or headaches or edema  No side effects to medicines  BP Readings from Last 3 Encounters:  01/13/14 136/78  12/24/13 122/74  12/17/13 150/70     hyperlpidemia  Had pain on statin  Lab Results  Component Value Date   CHOL 200 04/24/2012   HDL 62.20 04/24/2012   LDLCALC 118* 04/24/2012   LDLDIRECT 211.2 07/13/2010   TRIG 99.0 04/24/2012   CHOLHDL 3 04/24/2012   she will schedule a fasting lab appt   Wt is stable with bmi of 26  Osteopenia  dexa 2001-- not interested in dexa in the future (she has fallen and not broken bones) Needs to get back on ca  Due for vit D check  Hypothyroid- schedule her tsh later/ not up for it today   Still battling pain with chest wall - after her fall  She still has a lot of pain  It is over R lower ribs and under them  Had a lot of bruising afterward -with a lump under it -- this keeps her from sleeping and moving around a lot  She keeps heat on it - percocet is not helping much - very uncomfortable   She is working  on fall prevention in the home - took that very seriously  Has a walker beside the bed - helpful when she gets up at night   Patient Active Problem List   Diagnosis Date Noted  . Encounter for Medicare annual wellness exam 01/13/2014  . Rib pain on right side 12/24/2013  . Fall in home 12/24/2013  . Dysuria 12/19/2013  . History of breast cancer 12/18/2013  . Breast discharge 06/07/2013  . Left breast mass 04/03/2013  . Bloody discharge from nipple 03/11/2013  . Cerumen impaction 03/11/2013  . Thrombocytopenia, unspecified 02/22/2013  . Epiploic appendagitis 02/22/2013  . Abdominal pain, left lower quadrant 02/22/2013  . Dry mouth 02/04/2013  . Acute sinusitis 12/25/2012  . Chest wall pain 12/25/2012  .  Benign paroxysmal positional vertigo 09/14/2012  . Low back pain 09/07/2012  . Abnormal urinalysis 09/07/2012  . Constipation 11/04/2011  . UTI (lower urinary tract infection) 06/27/2011  . Aortic stenosis 08/31/2010  . Hyperlipidemia   . GASTRITIS 04/01/2009  . ABDOMINAL BLOATING 04/01/2009  . STRESS REACTION, ACUTE, WITH EMOTIONAL DISTURBANCE 10/12/2007  . [place] DISEASE, CERVICAL 10/12/2007  . Hypothyroidism 01/22/2007  . RAYNAUD'S SYNDROME 01/22/2007  . EXTERNAL HEMORRHOIDS 01/22/2007  . COPD 01/22/2007  . DIVERTICULOSIS, COLON 01/22/2007  . FIBROCYSTIC BREAST DISEASE 01/22/2007  . Osteoporosis 01/22/2007  . HYPERLIPIDEMIA 12/16/2006  . Essential hypertension 12/16/2006  . GERD 12/16/2006  . RHEUMATOID ARTHRITIS 12/16/2006  . DYSPNEA ON EXERTION 12/16/2006  . COUGH, CHRONIC 12/16/2006  . TOBACCO ABUSE, HX OF 12/16/2006  . HYSTERECTOMY, HX OF 12/16/2006   Past Medical History  Diagnosis Date  . Flatulence, eructation, and gas pain   . Chronic airway obstruction, not elsewhere classified   . Cough   . Degeneration of cervical intervertebral disc   . Diverticulosis of colon (without mention of hemorrhage)   . Other dyspnea and respiratory abnormality   . External hemorrhoids without mention of complication   . Diffuse cystic mastopathy   . Unspecified gastritis and gastroduodenitis without mention of hemorrhage   . Esophageal reflux   . Other and unspecified hyperlipidemia   . Unspecified essential hypertension   . Unspecified hypothyroidism   . Acquired absence of organ, genital organs   . Osteoporosis, unspecified   . Other acute sinusitis   . Raynaud's syndrome   . Rheumatoid arthritis(714.0)     Sero-negative  . Predominant disturbance of emotions   . Personal history of tobacco use, presenting hazards to health   . Lichen sclerosus     back/abd/vulvar  . Spinal stenosis   . Colitis, ischemic 07/2000    ? infect  . Mastoiditis of right side 04/2005    admitted   . Fibula fracture 2006  . Hyperlipidemia   . Wears dentures     top  . Breast cancer     surgery at [facility]   Past Surgical History  Procedure Laterality Date  . Abdominal hysterectomy  1968  . Dexa  2001    OP,heel  . Cardiollite  02/2002    Neg  . Nasal sinus surgery  05/2002  . Dexa      OP hip T-2.94  . US echocardiography  10/06    normal LVF EF 55-65%  . Nuclear stress test  11/07    Normal  . Tonsillectomy    . Colonoscopy     History  Substance Use Topics  . Smoking status: Former Smoker    Quit date: 02/07/1962  . Smokeless tobacco: Not on file  .  Alcohol Use: Yes     Comment: occ wine   Family History  Problem Relation Age of Onset  . Heart attack Father 94  . Coronary artery disease Sister 10    CABG  . Heart attack Brother 72  . Coronary artery disease      GF   Allergies  Allergen Reactions  . Ace Inhibitors     REACTION: cough  . Ezetimibe     REACTION: myalgia  . Ezetimibe-Simvastatin     REACTION: joints swell  . Moxifloxacin   . Rosuvastatin     REACTION: myalgia  . Septra [Sulfamethoxazole-Trimethoprim]     Sick feeling   . Simvastatin     REACTION: myalgia  . Triaminic    Current Outpatient Prescriptions on File Prior to Visit  Medication Sig Dispense Refill  . cholecalciferol (VITAMIN D) 1000 UNITS tablet Take 1,000 Units by mouth daily.      . ciprofloxacin (CIPRO) 250 MG tablet Take 1 tablet (250 mg total) by mouth 2 (two) times daily. 10 tablet 0  . cyclobenzaprine (FLEXERIL) 10 MG tablet Take 10 mg by mouth 3 (three) times daily as needed for muscle spasms.    Marland Kitchen docusate sodium (COLACE) 100 MG capsule Take 100 mg by mouth 2 (two) times daily.    Marland Kitchen levothyroxine (SYNTHROID, LEVOTHROID) 112 MCG tablet Take 1 tablet (112 mcg total) by mouth daily before breakfast. 90 tablet 1  . olmesartan-hydrochlorothiazide (BENICAR HCT) 40-25 MG per tablet Take 1 tablet by mouth daily.    Marland Kitchen oxyCODONE-acetaminophen (PERCOCET/ROXICET) 5-325 MG per  tablet Take 1 tablet by mouth at bedtime as needed for moderate pain or severe pain. 30 tablet 0  . Polyethylene Glycol 3350 (MIRALAX PO) Take by mouth.    . Probiotic Product (ALIGN PO) Take 1 tablet by mouth daily.     No current facility-administered medications on file prior to visit.      Review of Systems Review of Systems  Constitutional: Negative for fever, appetite change,  and unexpected weight change. pos for fatigue  Eyes: Negative for pain and visual disturbance.  Respiratory: Negative for cough and shortness of breath.  pos for chest wall pain after a fall  Cardiovascular: Negative for cp or palpitations    Gastrointestinal: Negative for nausea, diarrhea and pos for bloating and chronic constipation  Genitourinary: Negative for urgency and frequency. neg for dysuria  Skin: Negative for pallor or rash   Neurological: Negative for weakness, light-headedness, numbness and headaches.  Hematological: Negative for adenopathy. Does not bruise/bleed easily.  Psychiatric/Behavioral: Negative for dysphoric mood. The patient is not nervous/anxious.         Objective:   Physical Exam  Constitutional: She appears well-developed and well-nourished. No distress.  Appears fatigued   HENT:  Head: Normocephalic and atraumatic.  Right Ear: External ear normal.  Left Ear: External ear normal.  Mouth/Throat: Oropharynx is clear and moist.  Eyes: Conjunctivae and EOM are normal. Pupils are equal, round, and reactive to light. No scleral icterus.  Neck: Normal range of motion. Neck supple. No JVD present. Carotid bruit is not present. No thyromegaly present.  Cardiovascular: Normal rate, regular rhythm and intact distal pulses.  Exam reveals no gallop.   Murmur heard. Pulmonary/Chest: Effort normal and breath sounds normal. No respiratory distress. She has no wheezes. She exhibits tenderness.  Tender in R lower ribs without crepitus   Abdominal: Soft. Bowel sounds are normal. She  exhibits no distension, no abdominal bruit and no mass. There  is no tenderness.  Genitourinary: No breast swelling, tenderness, discharge or bleeding.  Breast exam: No mass, nodules, thickening, tenderness, bulging, retraction, inflamation, nipple discharge or skin changes noted.  No axillary or clavicular LA.    Baseline surgical changes from partial mastectomy on L breast  Well healed   Musculoskeletal: Normal range of motion. She exhibits no edema or tenderness.  Lymphadenopathy:    She has no cervical adenopathy.  Neurological: She is alert. She has normal reflexes. No cranial nerve deficit. She exhibits normal muscle tone. Coordination normal.  Skin: Skin is warm and dry. No rash noted. No erythema. No pallor.  Psychiatric: She has a normal mood and affect.          Assessment & Plan:   Problem List Items Addressed This Visit      Cardiovascular and Mediastinum   Essential hypertension    bp in fair control at this time  BP Readings from Last 1 Encounters:  01/13/14 136/78   No changes needed Disc lifstyle change with low sodium diet and exercise   Labs planned     Relevant Orders      CBC with Differential      Comprehensive metabolic panel      Lipid panel      TSH     Endocrine   Hypothyroidism    No clinical changes  tsh planned  No change in exam     Relevant Orders      TSH     Musculoskeletal and Integument   Osteoporosis    Will check D level with next lab profile Has had a fall but no fx Disc need for calcium/ vitamin D/ wt bearing exercise and bone density test every 2 y to monitor Disc safety/ fracture risk in detail   Pt declines further bone density tests or tx at this time     Relevant Orders      Vit D  25 hydroxy (rtn osteoporosis monitoring)     Other   Encounter for Medicare annual wellness exam - Primary    Reviewed health habits including diet and exercise and skin cancer prevention Reviewed appropriate screening tests for age    Also reviewed health mt list, fam hx and immunization status , as well as social and family history   See HPI  prevnar vaccine today      Hyperlipidemia    Diet controlled Lipid draw planned Rev low sat fat diet     Relevant Orders      Comprehensive metabolic panel      Lipid panel   Rib pain on right side    Still sore after her fall - in ribs and soft tissue  Reassuring exam  If further RUQ pain -would consider ultrasound  Disc use of warm comp and pain med Update if sob or cough      Other Visit Diagnoses    Need for vaccination with 13-polyvalent pneumococcal conjugate vaccine        Relevant Orders       Pneumococcal conjugate vaccine 13-valent (Completed)

## 2014-01-13 NOTE — Assessment & Plan Note (Signed)
No clinical changes  tsh planned  No change in exam

## 2014-01-13 NOTE — Assessment & Plan Note (Signed)
Still sore after her fall - in ribs and soft tissue  Reassuring exam  If further RUQ pain -would consider ultrasound  Disc use of warm comp and pain med Update if sob or cough

## 2014-01-13 NOTE — Assessment & Plan Note (Signed)
bp in fair control at this time  BP Readings from Last 1 Encounters:  01/13/14 136/78   No changes needed Disc lifstyle change with low sodium diet and exercise   Labs planned

## 2014-01-13 NOTE — Patient Instructions (Addendum)
If you decide to get a tetanus shot-you can [facility] it [facility] If you change your mind about a shingles vaccine (If you are interested in a shingles/zoster vaccine - call your insurance to check on coverage,( you should not get it within 1 month of other vaccines) , then call us for a prescription  for it to take to a pharmacy that gives the shot , or make a nurse visit to get it here depending on your coverage  Follow up with Dr Collene Mares as planned  If you want to pursue an ultrasound of right side please let me know    prevnar vaccine today  Schedule fasting labs when able

## 2014-01-14 ENCOUNTER — Telehealth: Payer: Self-pay | Admitting: Family Medicine

## 2014-01-14 NOTE — Telephone Encounter (Signed)
emmi emailed °

## 2014-07-04 ENCOUNTER — Ambulatory Visit (INDEPENDENT_AMBULATORY_CARE_PROVIDER_SITE_OTHER): Payer: Medicare Other | Admitting: Family Medicine

## 2014-07-04 ENCOUNTER — Encounter: Payer: Self-pay | Admitting: Family Medicine

## 2014-07-04 VITALS — BP 116/64 | HR 69 | Temp 98.3°F | Ht 62.0 in | Wt 143.2 lb

## 2014-07-04 DIAGNOSIS — N390 Urinary tract infection, site not specified: Secondary | ICD-10-CM

## 2014-07-04 DIAGNOSIS — R5383 Other fatigue: Secondary | ICD-10-CM | POA: Insufficient documentation

## 2014-07-04 DIAGNOSIS — E039 Hypothyroidism, unspecified: Secondary | ICD-10-CM | POA: Diagnosis not present

## 2014-07-04 DIAGNOSIS — R5382 Chronic fatigue, unspecified: Secondary | ICD-10-CM

## 2014-07-04 DIAGNOSIS — I10 Essential (primary) hypertension: Secondary | ICD-10-CM | POA: Diagnosis not present

## 2014-07-04 DIAGNOSIS — R300 Dysuria: Secondary | ICD-10-CM | POA: Diagnosis not present

## 2014-07-04 LAB — POCT URINALYSIS DIPSTICK
Glucose, UA: NEGATIVE
Nitrite, UA: NEGATIVE
Spec Grav, UA: >=1.03
Urobilinogen, UA: 0.2
pH, UA: 5.5

## 2014-07-04 MED ORDER — CIPROFLOXACIN HCL 250 MG PO TABS: 250.0000 mg | ORAL_TABLET | Freq: Two times a day (BID) | ORAL | Status: DC

## 2014-07-04 MED ORDER — LEVOTHYROXINE SODIUM 112 MCG PO TABS: 112.0000 ug | ORAL_TABLET | Freq: Every day | ORAL | Status: DC

## 2014-07-04 MED ORDER — OLMESARTAN MEDOXOMIL-HCTZ 40-25 MG PO TABS: 1.0000 | ORAL_TABLET | Freq: Every day | ORAL | Status: DC

## 2014-07-04 NOTE — Patient Instructions (Addendum)
Drink water  Take the cipro as directed for urine infection  We will contact you with urine culture result when it returns   Please schedule fasting labs in 1-2 weeks (after being back on medicine)

## 2014-07-04 NOTE — Progress Notes (Signed)
Pre visit review using our clinic review tool, if applicable. No additional management support is needed unless otherwise documented below in the visit note. 

## 2014-07-04 NOTE — Progress Notes (Signed)
Subjective:    Patient ID: Dawn Aguirre, female    DOB: 1925/07/05, 81 y.o.   MRN: [id number]  HPI Here for f/u of chronic medical problems and uti symptoms   Saw urology in Jan  Has chronic micro hematuria Nl cystoscopy   Today UA is pos   Symptoms  Low back pain and achiness in general  No energy at all  Urine stream feels "hot" and uncomfortable  Up all night to the urinating  No blood in urine    Results for orders placed or performed in visit on 07/04/14  POCT urinalysis dipstick  Result Value Ref Range   Color, UA Amber    Clarity, UA Cloudy    Glucose, UA Neg.    Bilirubin, UA Small    Ketones, UA Small    Spec Grav, UA >=1.030    Blood, UA Moderate    pH, UA 5.5    Protein, UA Trace    Urobilinogen, UA 0.2    Nitrite, UA Neg.    Leukocytes, UA large (3+)       Vertigo more easily    miralax for constipation and bloating  Not eating well / stress  Wt is stable with bmi of 26  Does not sleep well at night Caring for son with Down's Syndrome with medical problems- is getting help    bp is stable today  No cp or palpitations or headaches or edema  No side effects to medicines  BP Readings from Last 3 Encounters:  07/04/14 116/64  01/13/14 136/78  12/24/13 122/74     Takes benicar hct   Chemistry      Component Value Date/Time   NA 139 05/30/2013 1200   K 4.1 05/30/2013 1200   CL 101 05/30/2013 1200   CO2 26 05/30/2013 1200   BUN 25* 05/30/2013 1200   CREATININE 0.94 05/30/2013 1200      Component Value Date/Time   CALCIUM 9.4 05/30/2013 1200   ALKPHOS 66 02/17/2013 1234   AST 20 02/17/2013 1234   ALT 21 02/17/2013 1234   BILITOT 0.4 02/17/2013 1234     due for HTN labs    Hypothyroid Lab Results  Component Value Date   TSH 3.63 04/24/2012    Due for check  On levothyroxine  Due for tsh   Patient Active Problem List   Diagnosis Date Noted  . Encounter for Medicare annual wellness exam 01/13/2014  . Rib pain on right  side 12/24/2013  . Fall in home 12/24/2013  . Dysuria 12/19/2013  . History of breast cancer 12/18/2013  . Breast discharge 06/07/2013  . Left breast mass 04/03/2013  . Bloody discharge from nipple 03/11/2013  . Cerumen impaction 03/11/2013  . Thrombocytopenia, unspecified 02/22/2013  . Epiploic appendagitis 02/22/2013  . Abdominal pain, left lower quadrant 02/22/2013  . Dry mouth 02/04/2013  . Chest wall pain 12/25/2012  . Benign paroxysmal positional vertigo 09/14/2012  . Low back pain 09/07/2012  . Abnormal urinalysis 09/07/2012  . Constipation 11/04/2011  . UTI (lower urinary tract infection) 06/27/2011  . Aortic stenosis 08/31/2010  . Hyperlipidemia   . GASTRITIS 04/01/2009  . ABDOMINAL BLOATING 04/01/2009  . STRESS REACTION, ACUTE, WITH EMOTIONAL DISTURBANCE 10/12/2007  . [place] DISEASE, CERVICAL 10/12/2007  . Hypothyroidism 01/22/2007  . RAYNAUD'S SYNDROME 01/22/2007  . EXTERNAL HEMORRHOIDS 01/22/2007  . COPD 01/22/2007  . DIVERTICULOSIS, COLON 01/22/2007  . FIBROCYSTIC BREAST DISEASE 01/22/2007  . Osteoporosis 01/22/2007  . HYPERLIPIDEMIA 12/16/2006  . Essential  hypertension 12/16/2006  . GERD 12/16/2006  . RHEUMATOID ARTHRITIS 12/16/2006  . DYSPNEA ON EXERTION 12/16/2006  . COUGH, CHRONIC 12/16/2006  . TOBACCO ABUSE, HX OF 12/16/2006  . HYSTERECTOMY, HX OF 12/16/2006   Past Medical History  Diagnosis Date  . Flatulence, eructation, and gas pain   . Chronic airway obstruction, not elsewhere classified   . Cough   . Degeneration of cervical intervertebral disc   . Diverticulosis of colon (without mention of hemorrhage)   . Other dyspnea and respiratory abnormality   . External hemorrhoids without mention of complication   . Diffuse cystic mastopathy   . Unspecified gastritis and gastroduodenitis without mention of hemorrhage   . Esophageal reflux   . Other and unspecified hyperlipidemia   . Unspecified essential hypertension   . Unspecified hypothyroidism     . Acquired absence of organ, genital organs   . Osteoporosis, unspecified   . Other acute sinusitis   . Raynaud's syndrome   . Rheumatoid arthritis(714.0)     Sero-negative  . Predominant disturbance of emotions   . Personal history of tobacco use, presenting hazards to health   . Lichen sclerosus     back/abd/vulvar  . Spinal stenosis   . Colitis, ischemic 07/2000    ? infect  . Mastoiditis of right side 04/2005    admitted  . Fibula fracture 2006  . Hyperlipidemia   . Wears dentures     top  . Breast cancer     surgery at [facility]   Past Surgical History  Procedure Laterality Date  . Abdominal hysterectomy  1968  . Dexa  2001    OP,heel  . Cardiollite  02/2002    Neg  . Nasal sinus surgery  05/2002  . Dexa      OP hip T-2.94  . US echocardiography  10/06    normal LVF EF 55-65%  . Nuclear stress test  11/07    Normal  . Tonsillectomy    . Colonoscopy     History  Substance Use Topics  . Smoking status: Former Smoker    Quit date: 02/07/1962  . Smokeless tobacco: Not on file  . Alcohol Use: Yes     Comment: occ wine   Family History  Problem Relation Age of Onset  . Heart attack Father 54  . Coronary artery disease Sister 67    CABG  . Heart attack Brother 45  . Coronary artery disease      GF   Allergies  Allergen Reactions  . Ace Inhibitors     REACTION: cough  . Ezetimibe     REACTION: myalgia  . Ezetimibe-Simvastatin     REACTION: joints swell  . Moxifloxacin   . Rosuvastatin     REACTION: myalgia  . Septra [Sulfamethoxazole-Trimethoprim]     Sick feeling   . Simvastatin     REACTION: myalgia  . Triaminic    Current Outpatient Prescriptions on File Prior to Visit  Medication Sig Dispense Refill  . levothyroxine (SYNTHROID, LEVOTHROID) 112 MCG tablet Take 1 tablet (112 mcg total) by mouth daily before breakfast. 90 tablet 1  . olmesartan-hydrochlorothiazide (BENICAR HCT) 40-25 MG per tablet Take 1 tablet by mouth daily.    . Polyethylene  Glycol 3350 (MIRALAX PO) Take by mouth.    . Probiotic Product (ALIGN PO) Take 1 tablet by mouth daily.     No current facility-administered medications on file prior to visit.    Review of Systems Review of Systems  Constitutional:  Negative for fever, appetite change and unexpected weight change. pos for fatigue and malaise  Eyes: Negative for pain and visual disturbance.  Respiratory: Negative for cough and shortness of breath.   Cardiovascular: Negative for cp or palpitations    Gastrointestinal: Negative for nausea, diarrhea and constipation.  Genitourinary: pos  for urgency and frequency. Pos for dysuria and neg for gross hematuria Skin: Negative for pallor or rash   Neurological: Negative for weakness, light-headedness, numbness and headaches.  Hematological: Negative for adenopathy. Does not bruise/bleed easily.  Psychiatric/Behavioral: Negative for dysphoric mood. The patient is not nervous/anxious.         Objective:   Physical Exam  Constitutional: She appears well-developed and well-nourished. No distress.  overwt  Pt appears fatigued   HENT:  Head: Normocephalic and atraumatic.  Eyes: Conjunctivae and EOM are normal. Pupils are equal, round, and reactive to light.  Neck: Normal range of motion. Neck supple.  Cardiovascular: Normal rate, regular rhythm and normal heart sounds.   Pulmonary/Chest: Effort normal and breath sounds normal.  Abdominal: Soft. Bowel sounds are normal. She exhibits no distension. There is tenderness. There is no rebound.  No cva tenderness  Mild suprapubic tenderness  Musculoskeletal: She exhibits no edema.  Lymphadenopathy:    She has no cervical adenopathy.  Neurological: She is alert.  Skin: No rash noted.  Psychiatric: She has a normal mood and affect.  Pleasant and talkative           Assessment & Plan:   Problem List Items Addressed This Visit    Dysuria - Primary   Relevant Orders   POCT urinalysis dipstick (Completed)    Essential hypertension    bp in fair control at this time  BP Readings from Last 1 Encounters:  07/04/14 116/64   No changes needed Disc lifstyle change with low sodium diet and exercise  Lab due - planned fasting       Relevant Medications   olmesartan-hydrochlorothiazide (BENICAR HCT) 40-25 MG per tablet   Other Relevant Orders   CBC with Differential/Platelet   Comprehensive metabolic panel   Lipid panel   TSH   Fatigue    Suspect multifactorial Caregiver stress  Multiple medical problems at advanced age Also now UTI Will tx  Disc opt for help at home and pt is getting that       Relevant Orders   CBC with Differential/Platelet   Comprehensive metabolic panel   TSH   Vitamin B12   Hypothyroidism    Due for tsh  No change clinically except for fatigue- which could be multifactorial  Lab planned        Relevant Medications   levothyroxine (SYNTHROID, LEVOTHROID) 112 MCG tablet   Other Relevant Orders   TSH   UTI (lower urinary tract infection)    Positive ua   Urinary symptoms and malaise/fatigue and aches  Cover with cipro  Pend cx  Will update Update if not starting to improve in a week or if worsening        Relevant Orders   Urine culture (Completed)

## 2014-07-06 LAB — URINE CULTURE: Colony Count: 8000

## 2014-07-06 NOTE — Assessment & Plan Note (Signed)
Due for tsh  No change clinically except for fatigue- which could be multifactorial  Lab planned

## 2014-07-06 NOTE — Assessment & Plan Note (Addendum)
Positive ua   Urinary symptoms and malaise/fatigue and aches  Cover with cipro  Pend cx  Will update Update if not starting to improve in a week or if worsening

## 2014-07-06 NOTE — Assessment & Plan Note (Signed)
bp in fair control at this time  BP Readings from Last 1 Encounters:  07/04/14 116/64   No changes needed Disc lifstyle change with low sodium diet and exercise  Lab due - planned fasting

## 2014-07-07 NOTE — Assessment & Plan Note (Signed)
Suspect multifactorial Caregiver stress  Multiple medical problems at advanced age Also now UTI Will tx  Disc opt for help at home and pt is getting that

## 2014-07-11 ENCOUNTER — Other Ambulatory Visit (INDEPENDENT_AMBULATORY_CARE_PROVIDER_SITE_OTHER): Payer: Medicare Other

## 2014-07-11 DIAGNOSIS — E538 Deficiency of other specified B group vitamins: Secondary | ICD-10-CM

## 2014-07-11 DIAGNOSIS — R5382 Chronic fatigue, unspecified: Secondary | ICD-10-CM | POA: Diagnosis not present

## 2014-07-11 DIAGNOSIS — M810 Age-related osteoporosis without current pathological fracture: Secondary | ICD-10-CM | POA: Diagnosis not present

## 2014-07-11 DIAGNOSIS — I10 Essential (primary) hypertension: Secondary | ICD-10-CM | POA: Diagnosis not present

## 2014-07-11 DIAGNOSIS — E039 Hypothyroidism, unspecified: Secondary | ICD-10-CM | POA: Diagnosis not present

## 2014-07-11 LAB — CBC WITH DIFFERENTIAL/PLATELET
Basophils Absolute: 0 10*3/uL (ref 0.0–0.1)
Basophils Relative: 0.4 % (ref 0.0–3.0)
Eosinophils Absolute: 0.2 10*3/uL (ref 0.0–0.7)
Eosinophils Relative: 4.4 % (ref 0.0–5.0)
HCT: 36.2 % (ref 36.0–46.0)
Hemoglobin: 12.2 g/dL (ref 12.0–15.0)
Lymphocytes Relative: 33.7 % (ref 12.0–46.0)
Lymphs Abs: 1.9 10*3/uL (ref 0.7–4.0)
MCHC: 33.6 g/dL (ref 30.0–36.0)
MCV: 91.4 fl (ref 78.0–100.0)
Monocytes Absolute: 0.4 10*3/uL (ref 0.1–1.0)
Monocytes Relative: 7.1 % (ref 3.0–12.0)
Neutro Abs: 3 10*3/uL (ref 1.4–7.7)
Neutrophils Relative %: 54.4 % (ref 43.0–77.0)
Platelets: 138 10*3/uL — ABNORMAL LOW (ref 150.0–400.0)
RBC: 3.96 Mil/uL (ref 3.87–5.11)
RDW: 12.8 % (ref 11.5–15.5)
WBC: 5.5 10*3/uL (ref 4.0–10.5)

## 2014-07-11 LAB — COMPREHENSIVE METABOLIC PANEL
ALT: 15 U/L (ref 0–35)
AST: 16 U/L (ref 0–37)
Albumin: 4.1 g/dL (ref 3.5–5.2)
Alkaline Phosphatase: 46 U/L (ref 39–117)
BUN: 18 mg/dL (ref 6–23)
CO2: 29 mEq/L (ref 19–32)
Calcium: 9.3 mg/dL (ref 8.4–10.5)
Chloride: 106 mEq/L (ref 96–112)
Creatinine, Ser: 1.04 mg/dL (ref 0.40–1.20)
GFR: 53.25 mL/min — ABNORMAL LOW (ref >60.00)
Glucose, Bld: 92 mg/dL (ref 70–99)
Potassium: 4 mEq/L (ref 3.5–5.1)
Sodium: 138 mEq/L (ref 135–145)
Total Bilirubin: 0.5 mg/dL (ref 0.2–1.2)
Total Protein: 6.9 g/dL (ref 6.0–8.3)

## 2014-07-11 LAB — LIPID PANEL
Cholesterol: 270 mg/dL — ABNORMAL HIGH (ref 0–200)
HDL: 65.2 mg/dL (ref >39.00)
LDL Cholesterol: 188 mg/dL — ABNORMAL HIGH (ref 0–99)
NonHDL: 204.8
Total CHOL/HDL Ratio: 4
Triglycerides: 85 mg/dL (ref 0.0–149.0)
VLDL: 17 mg/dL (ref 0.0–40.0)

## 2014-07-11 LAB — VITAMIN D 25 HYDROXY (VIT D DEFICIENCY, FRACTURES): VITD: 20.31 ng/mL — ABNORMAL LOW (ref 30.00–100.00)

## 2014-07-11 LAB — VITAMIN B12: Vitamin B-12: 113 pg/mL — ABNORMAL LOW (ref 211–911)

## 2014-07-11 LAB — TSH: TSH: 4.42 u[IU]/mL (ref 0.35–4.50)

## 2014-07-16 ENCOUNTER — Ambulatory Visit (INDEPENDENT_AMBULATORY_CARE_PROVIDER_SITE_OTHER): Payer: Medicare Other | Admitting: Family Medicine

## 2014-07-16 ENCOUNTER — Encounter: Payer: Self-pay | Admitting: Family Medicine

## 2014-07-16 VITALS — BP 128/70 | HR 70 | Temp 98.5°F | Ht 62.0 in | Wt 145.8 lb

## 2014-07-16 DIAGNOSIS — E039 Hypothyroidism, unspecified: Secondary | ICD-10-CM

## 2014-07-16 DIAGNOSIS — R5382 Chronic fatigue, unspecified: Secondary | ICD-10-CM | POA: Diagnosis not present

## 2014-07-16 DIAGNOSIS — M5441 Lumbago with sciatica, right side: Secondary | ICD-10-CM | POA: Diagnosis not present

## 2014-07-16 DIAGNOSIS — E559 Vitamin D deficiency, unspecified: Secondary | ICD-10-CM

## 2014-07-16 DIAGNOSIS — E785 Hyperlipidemia, unspecified: Secondary | ICD-10-CM

## 2014-07-16 DIAGNOSIS — E538 Deficiency of other specified B group vitamins: Secondary | ICD-10-CM | POA: Diagnosis not present

## 2014-07-16 DIAGNOSIS — R8290 Unspecified abnormal findings in urine: Secondary | ICD-10-CM

## 2014-07-16 MED ORDER — CYANOCOBALAMIN 1000 MCG/ML IJ SOLN
1000.0000 ug | Freq: Once | INTRAMUSCULAR | Status: AC
  Administered 2014-07-16: 1000 ug via INTRAMUSCULAR

## 2014-07-16 MED ORDER — ERGOCALCIFEROL 1.25 MG (50000 UT) PO CAPS: 50000.0000 [IU] | ORAL_CAPSULE | ORAL | Status: DC

## 2014-07-16 NOTE — Progress Notes (Signed)
Pre visit review using our clinic review tool, if applicable. No additional management support is needed unless otherwise documented below in the visit note. 

## 2014-07-16 NOTE — Patient Instructions (Signed)
I'm glad you are getting more help at home  Start vitamin B12 orally 1000 mcg once daily  First B12 shot of 4 today (one weekly for 4 weeks) follow up with me in 5 weeks for visit and labs  Also start vitamin D over the counter 2000 iu daily  And px vitamin D 50,000 iu weekly for 12 weeks -this should help get that level up  Follow up with your back doctor about back pain (urine culture is not positive)

## 2014-07-16 NOTE — Progress Notes (Signed)
Subjective:    Patient ID: Dawn Aguirre, female    DOB: November 05, 1925, 81 y.o.   MRN: [id number]  HPI Here for f/u of multiple problems   Fatigue - just dx with B12 def and D def  Still exhausted -totally  Now her hands get numb feeling occasions   Lab Results  Component Value Date   VITAMINB12 113* 07/11/2014   Also D level is 20 - also quite  Is not taking any vitamin D   A lot of work at home  Getting more help   Lab Results  Component Value Date   TSH 4.42 07/11/2014   no change needed   Cholesterol quite high Lab Results  Component Value Date   CHOL 270* 07/11/2014   HDL 65.20 07/11/2014   LDLCALC 188* 07/11/2014   LDLDIRECT 211.2 07/13/2010   TRIG 85.0 07/11/2014   CHOLHDL 4 07/11/2014   cannot take the statins-intolerant   Urine culture - not positive (8000 colonies)  Has uterine prolapse that causes irritation and did not tolerate a pessary  Back pain is likely coming from her back Has DDD  Sees a back specialist   Patient Active Problem List   Diagnosis Date Noted  . B12 deficiency 07/16/2014  . Vitamin D deficiency 07/16/2014  . Fatigue 07/04/2014  . Encounter for Medicare annual wellness exam 01/13/2014  . Rib pain on right side 12/24/2013  . Fall in home 12/24/2013  . Dysuria 12/19/2013  . History of breast cancer 12/18/2013  . Breast discharge 06/07/2013  . Left breast mass 04/03/2013  . Bloody discharge from nipple 03/11/2013  . Cerumen impaction 03/11/2013  . Thrombocytopenia, unspecified 02/22/2013  . Epiploic appendagitis 02/22/2013  . Abdominal pain, left lower quadrant 02/22/2013  . Dry mouth 02/04/2013  . Chest wall pain 12/25/2012  . Benign paroxysmal positional vertigo 09/14/2012  . Low back pain 09/07/2012  . Abnormal urinalysis 09/07/2012  . Constipation 11/04/2011  . UTI (lower urinary tract infection) 06/27/2011  . Aortic stenosis 08/31/2010  . Hyperlipidemia   . GASTRITIS 04/01/2009  . ABDOMINAL BLOATING 04/01/2009    . STRESS REACTION, ACUTE, WITH EMOTIONAL DISTURBANCE 10/12/2007  . [place] DISEASE, CERVICAL 10/12/2007  . Hypothyroidism 01/22/2007  . RAYNAUD'S SYNDROME 01/22/2007  . EXTERNAL HEMORRHOIDS 01/22/2007  . COPD 01/22/2007  . DIVERTICULOSIS, COLON 01/22/2007  . FIBROCYSTIC BREAST DISEASE 01/22/2007  . Osteoporosis 01/22/2007  . HYPERLIPIDEMIA 12/16/2006  . Essential hypertension 12/16/2006  . GERD 12/16/2006  . RHEUMATOID ARTHRITIS 12/16/2006  . DYSPNEA ON EXERTION 12/16/2006  . COUGH, CHRONIC 12/16/2006  . TOBACCO ABUSE, HX OF 12/16/2006  . HYSTERECTOMY, HX OF 12/16/2006   Past Medical History  Diagnosis Date  . Flatulence, eructation, and gas pain   . Chronic airway obstruction, not elsewhere classified   . Cough   . Degeneration of cervical intervertebral disc   . Diverticulosis of colon (without mention of hemorrhage)   . Other dyspnea and respiratory abnormality   . External hemorrhoids without mention of complication   . Diffuse cystic mastopathy   . Unspecified gastritis and gastroduodenitis without mention of hemorrhage   . Esophageal reflux   . Other and unspecified hyperlipidemia   . Unspecified essential hypertension   . Unspecified hypothyroidism   . Acquired absence of organ, genital organs   . Osteoporosis, unspecified   . Other acute sinusitis   . Raynaud's syndrome   . Rheumatoid arthritis(714.0)     Sero-negative  . Predominant disturbance of emotions   . Personal history  of tobacco use, presenting hazards to health   . Lichen sclerosus     back/abd/vulvar  . Spinal stenosis   . Colitis, ischemic 07/2000    ? infect  . Mastoiditis of right side 04/2005    admitted  . Fibula fracture 2006  . Hyperlipidemia   . Wears dentures     top  . Breast cancer     surgery at [facility]   Past Surgical History  Procedure Laterality Date  . Abdominal hysterectomy  1968  . Dexa  2001    OP,heel  . Cardiollite  02/2002    Neg  . Nasal sinus surgery  05/2002  . Dexa       OP hip T-2.94  . US echocardiography  10/06    normal LVF EF 55-65%  . Nuclear stress test  11/07    Normal  . Tonsillectomy    . Colonoscopy     History  Substance Use Topics  . Smoking status: Former Smoker    Quit date: 02/07/1962  . Smokeless tobacco: Not on file  . Alcohol Use: 0.0 oz/week    0 Standard drinks or equivalent per week     Comment: occ wine   Family History  Problem Relation Age of Onset  . Heart attack Father 89  . Coronary artery disease Sister 2    CABG  . Heart attack Brother 50  . Coronary artery disease      GF   Allergies  Allergen Reactions  . Ace Inhibitors     REACTION: cough  . Ezetimibe     REACTION: myalgia  . Ezetimibe-Simvastatin     REACTION: joints swell  . Moxifloxacin   . Rosuvastatin     REACTION: myalgia  . Septra [Sulfamethoxazole-Trimethoprim]     Sick feeling   . Simvastatin     REACTION: myalgia  . Triaminic    Current Outpatient Prescriptions on File Prior to Visit  Medication Sig Dispense Refill  . levothyroxine (SYNTHROID, LEVOTHROID) 112 MCG tablet Take 1 tablet (112 mcg total) by mouth daily before breakfast. 90 tablet 3  . olmesartan-hydrochlorothiazide (BENICAR HCT) 40-25 MG per tablet Take 1 tablet by mouth daily. 90 tablet 3  . Polyethylene Glycol 3350 (MIRALAX PO) Take by mouth.    . Probiotic Product (ALIGN PO) Take 1 tablet by mouth daily.     No current facility-administered medications on file prior to visit.         Review of Systems Review of Systems  Constitutional: Negative for fever, appetite change,  and unexpected weight change.  Eyes: Negative for pain and visual disturbance.  Respiratory: Negative for cough and shortness of breath.   Cardiovascular: Negative for cp or palpitations    Gastrointestinal: Negative for nausea, diarrhea and constipation.  Genitourinary: Negative for urgency and frequency. pos for incontinence and prolapse MSK pos for low back pain  Skin: Negative for  pallor or rash   Neurological: Negative for weakness, light-headedness, numbness and headaches.  Hematological: Negative for adenopathy. Does not bruise/bleed easily.  Psychiatric/Behavioral: Negative for dysphoric mood. The patient is not nervous/anxious.         Objective:   Physical Exam  Constitutional: She appears well-developed and well-nourished. No distress.  slt frail appearing elderly female  HENT:  Head: Normocephalic and atraumatic.  Mouth/Throat: Oropharynx is clear and moist.  Eyes: Conjunctivae and EOM are normal. Pupils are equal, round, and reactive to light. No scleral icterus.  Neck: Normal range of motion. Neck supple.  No JVD present. Carotid bruit is not present. No thyroid mass and no thyromegaly present.  Cardiovascular: Normal rate and regular rhythm.   Pulmonary/Chest: Effort normal and breath sounds normal. No respiratory distress. She has no wheezes. She has no rales.  Abdominal: She exhibits no distension. There is no tenderness.  No suprapubic tenderness or fullness   No cva tenderness   Musculoskeletal: She exhibits no edema.  Poor rom LS   Lymphadenopathy:    She has no cervical adenopathy.  Neurological: She is alert. She has normal reflexes. No cranial nerve deficit. She exhibits normal muscle tone. Coordination normal.  Skin: Skin is warm and dry. No rash noted. No erythema. No pallor.  Psychiatric: She has a normal mood and affect.          Assessment & Plan:   Problem List Items Addressed This Visit    Abnormal urinalysis    Urine cx clear - pt has seen urol  Continue to watch  Suspect back pain is MSK in nature      B12 deficiency    Lab Results  Component Value Date   VITAMINB12 113* 07/11/2014   With symptoms of fatigue and malaise  Will begin series of 4 B12 inj in 4 weeks  Start 1000 mcg otc B12 daily  Re check level in 5 weeks       Relevant Medications   cyanocobalamin ((VITAMIN B-12)) injection 1,000 mcg (Completed)    Fatigue    Suspect multifactorial  Will replace B12 and D  She will f/u with ortho about back pain and joint problems  Continue to follow  F/u planned       Hyperlipidemia    Disc goals for lipids and reasons to control them Rev labs with pt Rev low sat fat diet in detail  LDL remains high- will address further after more acute medical issues       Hypothyroidism - Primary    Stable  Lab Results  Component Value Date   TSH 4.42 07/11/2014    Fatigue but no other clinical changes       Low back pain    Clear urine cx  DDD hx of  Will f/u with orthopedics       Vitamin D deficiency    D level is low  Will begin 2000 iu otc D3 daily  Also 50,000 iu weekly for 12 weeks  Disc imp to bone and overall health

## 2014-07-17 NOTE — Assessment & Plan Note (Signed)
Urine cx clear - pt has seen urol  Continue to watch  Suspect back pain is MSK in nature

## 2014-07-17 NOTE — Assessment & Plan Note (Signed)
Lab Results  Component Value Date   VITAMINB12 113* 07/11/2014   With symptoms of fatigue and malaise  Will begin series of 4 B12 inj in 4 weeks  Start 1000 mcg otc B12 daily  Re check level in 5 weeks

## 2014-07-17 NOTE — Assessment & Plan Note (Signed)
Clear urine cx  DDD hx of  Will f/u with orthopedics

## 2014-07-17 NOTE — Assessment & Plan Note (Signed)
Stable  Lab Results  Component Value Date   TSH 4.42 07/11/2014    Fatigue but no other clinical changes

## 2014-07-17 NOTE — Assessment & Plan Note (Signed)
Disc goals for lipids and reasons to control them Rev labs with pt Rev low sat fat diet in detail  LDL remains high- will address further after more acute medical issues

## 2014-07-17 NOTE — Assessment & Plan Note (Signed)
D level is low  Will begin 2000 iu otc D3 daily  Also 50,000 iu weekly for 12 weeks  Disc imp to bone and overall health

## 2014-07-17 NOTE — Assessment & Plan Note (Signed)
Suspect multifactorial  Will replace B12 and D  She will f/u with ortho about back pain and joint problems  Continue to follow  F/u planned

## 2014-07-23 ENCOUNTER — Ambulatory Visit (INDEPENDENT_AMBULATORY_CARE_PROVIDER_SITE_OTHER): Payer: Medicare Other

## 2014-07-23 DIAGNOSIS — E538 Deficiency of other specified B group vitamins: Secondary | ICD-10-CM

## 2014-07-23 MED ORDER — CYANOCOBALAMIN 1000 MCG/ML IJ SOLN
1000.0000 ug | Freq: Once | INTRAMUSCULAR | Status: AC
  Administered 2014-07-23: 1000 ug via INTRAMUSCULAR

## 2014-07-30 ENCOUNTER — Ambulatory Visit (INDEPENDENT_AMBULATORY_CARE_PROVIDER_SITE_OTHER): Payer: Medicare Other

## 2014-07-30 DIAGNOSIS — E538 Deficiency of other specified B group vitamins: Secondary | ICD-10-CM

## 2014-07-30 MED ORDER — CYANOCOBALAMIN 1000 MCG/ML IJ SOLN
1000.0000 ug | Freq: Once | INTRAMUSCULAR | Status: AC
  Administered 2014-07-30: 1000 ug via INTRAMUSCULAR

## 2014-08-06 ENCOUNTER — Ambulatory Visit (INDEPENDENT_AMBULATORY_CARE_PROVIDER_SITE_OTHER): Payer: Medicare Other | Admitting: *Deleted

## 2014-08-06 DIAGNOSIS — E538 Deficiency of other specified B group vitamins: Secondary | ICD-10-CM

## 2014-08-06 MED ORDER — CYANOCOBALAMIN 1000 MCG/ML IJ SOLN
1000.0000 ug | Freq: Once | INTRAMUSCULAR | Status: AC
  Administered 2014-08-06: 1000 ug via INTRAMUSCULAR

## 2014-08-25 ENCOUNTER — Ambulatory Visit (INDEPENDENT_AMBULATORY_CARE_PROVIDER_SITE_OTHER): Payer: Medicare Other | Admitting: Family Medicine

## 2014-08-25 ENCOUNTER — Encounter: Payer: Self-pay | Admitting: Family Medicine

## 2014-08-25 VITALS — BP 134/70 | HR 73 | Temp 97.6°F | Ht 62.0 in | Wt 143.8 lb

## 2014-08-25 DIAGNOSIS — R5382 Chronic fatigue, unspecified: Secondary | ICD-10-CM

## 2014-08-25 DIAGNOSIS — E559 Vitamin D deficiency, unspecified: Secondary | ICD-10-CM

## 2014-08-25 DIAGNOSIS — E538 Deficiency of other specified B group vitamins: Secondary | ICD-10-CM

## 2014-08-25 LAB — VITAMIN B12: Vitamin B-12: 439 pg/mL (ref 211–911)

## 2014-08-25 NOTE — Progress Notes (Signed)
Subjective:    Patient ID: Dawn Aguirre, female    DOB: 1925-04-25, 81 y.o.   MRN: [id number]  HPI Here for f/u of chronic health problems and fatigue  She was able to go to her grandson's wedding in [place]- and that went very well   Is currently on prednisone for shoulder pain (Dr Lynann Bologna)- just finished yesterday   Getting more help with her son   She is still tired  Stays up until 5 am - writing notes and planning - just not enough hours in the day  Is getting some sleep  Tries to catch up  Her son is coming in soon - to help out also with some paperwork etc    Last visit  Lab Results  Component Value Date   VITAMINB12 113* 07/11/2014   Pt was extremely fatigued rec 4 B12 shots in 4 weeks Not sure if they are helping that much with fatigue (but that is coming from different things)   Due for re check level today  Not eating well/enough - skips meals due to schedule and not much appetite  An egg a day  Some salads    Does not think she is depressed  Perhaps irritable at times  Not tearful or hopeless   Also D level in 20s Given 12 wk course of high dose tx  Also inc 2000 iu daily on her own as well    Wt is down 2 lb with bmi of 26  Patient Active Problem List   Diagnosis Date Noted  . B12 deficiency 07/16/2014  . Vitamin D deficiency 07/16/2014  . Fatigue 07/04/2014  . Encounter for Medicare annual wellness exam 01/13/2014  . Rib pain on right side 12/24/2013  . Fall in home 12/24/2013  . Dysuria 12/19/2013  . History of breast cancer 12/18/2013  . Breast discharge 06/07/2013  . Left breast mass 04/03/2013  . Bloody discharge from nipple 03/11/2013  . Cerumen impaction 03/11/2013  . Thrombocytopenia, unspecified 02/22/2013  . Epiploic appendagitis 02/22/2013  . Abdominal pain, left lower quadrant 02/22/2013  . Dry mouth 02/04/2013  . Chest wall pain 12/25/2012  . Benign paroxysmal positional vertigo 09/14/2012  . Low back pain 09/07/2012  .  Abnormal urinalysis 09/07/2012  . Constipation 11/04/2011  . UTI (lower urinary tract infection) 06/27/2011  . Aortic stenosis 08/31/2010  . Hyperlipidemia   . GASTRITIS 04/01/2009  . ABDOMINAL BLOATING 04/01/2009  . STRESS REACTION, ACUTE, WITH EMOTIONAL DISTURBANCE 10/12/2007  . [place] DISEASE, CERVICAL 10/12/2007  . Hypothyroidism 01/22/2007  . RAYNAUD'S SYNDROME 01/22/2007  . EXTERNAL HEMORRHOIDS 01/22/2007  . COPD 01/22/2007  . DIVERTICULOSIS, COLON 01/22/2007  . FIBROCYSTIC BREAST DISEASE 01/22/2007  . Osteoporosis 01/22/2007  . HYPERLIPIDEMIA 12/16/2006  . Essential hypertension 12/16/2006  . GERD 12/16/2006  . RHEUMATOID ARTHRITIS 12/16/2006  . DYSPNEA ON EXERTION 12/16/2006  . COUGH, CHRONIC 12/16/2006  . TOBACCO ABUSE, HX OF 12/16/2006  . HYSTERECTOMY, HX OF 12/16/2006   Past Medical History  Diagnosis Date  . Flatulence, eructation, and gas pain   . Chronic airway obstruction, not elsewhere classified   . Cough   . Degeneration of cervical intervertebral disc   . Diverticulosis of colon (without mention of hemorrhage)   . Other dyspnea and respiratory abnormality   . External hemorrhoids without mention of complication   . Diffuse cystic mastopathy   . Unspecified gastritis and gastroduodenitis without mention of hemorrhage   . Esophageal reflux   . Other and unspecified hyperlipidemia   .  Unspecified essential hypertension   . Unspecified hypothyroidism   . Acquired absence of organ, genital organs   . Osteoporosis, unspecified   . Other acute sinusitis   . Raynaud's syndrome   . Rheumatoid arthritis(714.0)     Sero-negative  . Predominant disturbance of emotions   . Personal history of tobacco use, presenting hazards to health   . Lichen sclerosus     back/abd/vulvar  . Spinal stenosis   . Colitis, ischemic 07/2000    ? infect  . Mastoiditis of right side 04/2005    admitted  . Fibula fracture 2006  . Hyperlipidemia   . Wears dentures     top  .  Breast cancer     surgery at [facility]   Past Surgical History  Procedure Laterality Date  . Abdominal hysterectomy  1968  . Dexa  2001    OP,heel  . Cardiollite  02/2002    Neg  . Nasal sinus surgery  05/2002  . Dexa      OP hip T-2.94  . US echocardiography  10/06    normal LVF EF 55-65%  . Nuclear stress test  11/07    Normal  . Tonsillectomy    . Colonoscopy     History  Substance Use Topics  . Smoking status: Former Smoker    Quit date: 02/07/1962  . Smokeless tobacco: Not on file  . Alcohol Use: 0.0 oz/week    0 Standard drinks or equivalent per week     Comment: occ wine   Family History  Problem Relation Age of Onset  . Heart attack Father 43  . Coronary artery disease Sister 72    CABG  . Heart attack Brother 42  . Coronary artery disease      GF   Allergies  Allergen Reactions  . Ace Inhibitors     REACTION: cough  . Ezetimibe     REACTION: myalgia  . Ezetimibe-Simvastatin     REACTION: joints swell  . Moxifloxacin   . Rosuvastatin     REACTION: myalgia  . Septra [Sulfamethoxazole-Trimethoprim]     Sick feeling   . Simvastatin     REACTION: myalgia  . Triaminic    Current Outpatient Prescriptions on File Prior to Visit  Medication Sig Dispense Refill  . ergocalciferol (VITAMIN D2) 50000 UNITS capsule Take 1 capsule (50,000 Units total) by mouth once a week. 12 capsule 0  . levothyroxine (SYNTHROID, LEVOTHROID) 112 MCG tablet Take 1 tablet (112 mcg total) by mouth daily before breakfast. 90 tablet 3  . Polyethylene Glycol 3350 (MIRALAX PO) Take by mouth.    . Probiotic Product (ALIGN PO) Take 1 tablet by mouth daily.    Marland Kitchen olmesartan-hydrochlorothiazide (BENICAR HCT) 40-25 MG per tablet Take 1 tablet by mouth daily. (Patient not taking: Reported on 08/25/2014) 90 tablet 3   No current facility-administered medications on file prior to visit.    Review of Systems Review of Systems  Constitutional: Negative for fever, appetite change, and unexpected  weight change.  Eyes: Negative for pain and visual disturbance.  Respiratory: Negative for cough and shortness of breath.   Cardiovascular: Negative for cp or palpitations    Gastrointestinal: Negative for nausea, diarrhea and constipation.  Genitourinary: Negative for urgency and frequency. pos for intermittent symptoms of overactive bladder  MSK pos for chronic back pain  Skin: Negative for pallor or rash   Neurological: Negative for weakness, light-headedness, numbness and headaches.  Hematological: Negative for adenopathy. Does not bruise/bleed easily.  Psychiatric/Behavioral: Negative for dysphoric mood. The patient is not nervous/anxious.         Objective:   Physical Exam  Constitutional: She appears well-developed and well-nourished. No distress.  overwt and well app  HENT:  Head: Normocephalic and atraumatic.  Mouth/Throat: Oropharynx is clear and moist.  Eyes: Conjunctivae and EOM are normal. Pupils are equal, round, and reactive to light.  Neck: Normal range of motion. Neck supple. No JVD present. Carotid bruit is not present. No thyromegaly present.  Cardiovascular: Normal rate, regular rhythm and intact distal pulses.  Exam reveals no gallop.   Murmur heard. Pulmonary/Chest: Effort normal and breath sounds normal. No respiratory distress. She has no wheezes. She has no rales.  No crackles  Abdominal: Soft. Bowel sounds are normal. She exhibits no distension, no abdominal bruit and no mass. There is no tenderness.  Musculoskeletal: She exhibits no edema.  Lymphadenopathy:    She has no cervical adenopathy.  Neurological: She is alert. She has normal reflexes. No sensory deficit.  Skin: Skin is warm and dry. No rash noted.  Psychiatric: She has a normal mood and affect.  Good mood overall  Pt does seem more energetic than at last visit           Assessment & Plan:   Problem List Items Addressed This Visit    B12 deficiency - Primary    Check level today after  4 B12 shots in 4 weeks  Then decide on mt tx  Pt states she is still fatigued but she does seem more energetic today      Relevant Orders   Vitamin B12 (Completed)   Fatigue    Suspect multifactorial  tx B12 def- lab today Also on high dose vit D Suspect stress and lack of time for self care along with chronic pain -all play a role Pt denies symptoms of depression       Vitamin D deficiency    Pt on 12 week high dose weekly tx along with 2000 iu daily Disc imp to bone and overall health

## 2014-08-25 NOTE — Assessment & Plan Note (Signed)
Pt on 12 week high dose weekly tx along with 2000 iu daily Disc imp to bone and overall health

## 2014-08-25 NOTE — Progress Notes (Signed)
Pre visit review using our clinic review tool, if applicable. No additional management support is needed unless otherwise documented below in the visit note. 

## 2014-08-25 NOTE — Assessment & Plan Note (Signed)
Suspect multifactorial  tx B12 def- lab today Also on high dose vit D Suspect stress and lack of time for self care along with chronic pain -all play a role Pt denies symptoms of depression

## 2014-08-25 NOTE — Patient Instructions (Signed)
Keep getting help at home with as much as possible so you have time for health care  B12 level today (let's see what we will do from there)  Continue your vitamin D  Stay active and also try to get enough rest

## 2014-08-25 NOTE — Assessment & Plan Note (Signed)
Check level today after 4 B12 shots in 4 weeks  Then decide on mt tx  Pt states she is still fatigued but she does seem more energetic today

## 2014-09-09 ENCOUNTER — Ambulatory Visit (INDEPENDENT_AMBULATORY_CARE_PROVIDER_SITE_OTHER): Payer: Medicare Other | Admitting: *Deleted

## 2014-09-09 DIAGNOSIS — E538 Deficiency of other specified B group vitamins: Secondary | ICD-10-CM | POA: Diagnosis not present

## 2014-09-09 MED ORDER — CYANOCOBALAMIN 1000 MCG/ML IJ SOLN
1000.0000 ug | Freq: Once | INTRAMUSCULAR | Status: AC
  Administered 2014-09-09: 1000 ug via INTRAMUSCULAR

## 2014-10-14 ENCOUNTER — Ambulatory Visit: Payer: Medicare Other

## 2014-10-14 ENCOUNTER — Ambulatory Visit (INDEPENDENT_AMBULATORY_CARE_PROVIDER_SITE_OTHER): Payer: Medicare Other | Admitting: Family Medicine

## 2014-10-14 ENCOUNTER — Encounter: Payer: Self-pay | Admitting: Family Medicine

## 2014-10-14 VITALS — BP 146/78 | HR 82 | Temp 98.3°F | Ht 62.0 in | Wt 146.8 lb

## 2014-10-14 DIAGNOSIS — R350 Frequency of micturition: Secondary | ICD-10-CM | POA: Diagnosis not present

## 2014-10-14 DIAGNOSIS — R509 Fever, unspecified: Secondary | ICD-10-CM | POA: Diagnosis not present

## 2014-10-14 DIAGNOSIS — E538 Deficiency of other specified B group vitamins: Secondary | ICD-10-CM | POA: Diagnosis not present

## 2014-10-14 DIAGNOSIS — R8290 Unspecified abnormal findings in urine: Secondary | ICD-10-CM

## 2014-10-14 DIAGNOSIS — S0093XA Contusion of unspecified part of head, initial encounter: Secondary | ICD-10-CM

## 2014-10-14 DIAGNOSIS — G8929 Other chronic pain: Secondary | ICD-10-CM | POA: Insufficient documentation

## 2014-10-14 LAB — CBC WITH DIFFERENTIAL/PLATELET
Basophils Absolute: 0 10*3/uL (ref 0.0–0.1)
Basophils Relative: 0.3 % (ref 0.0–3.0)
Eosinophils Absolute: 0.2 10*3/uL (ref 0.0–0.7)
Eosinophils Relative: 2.4 % (ref 0.0–5.0)
HCT: 36.2 % (ref 36.0–46.0)
Hemoglobin: 12.3 g/dL (ref 12.0–15.0)
Lymphocytes Relative: 26.2 % (ref 12.0–46.0)
Lymphs Abs: 1.9 10*3/uL (ref 0.7–4.0)
MCHC: 34 g/dL (ref 30.0–36.0)
MCV: 89.8 fl (ref 78.0–100.0)
Monocytes Absolute: 0.5 10*3/uL (ref 0.1–1.0)
Monocytes Relative: 6.2 % (ref 3.0–12.0)
Neutro Abs: 4.8 10*3/uL (ref 1.4–7.7)
Neutrophils Relative %: 64.9 % (ref 43.0–77.0)
Platelets: 170 10*3/uL (ref 150.0–400.0)
RBC: 4.02 Mil/uL (ref 3.87–5.11)
RDW: 12.7 % (ref 11.5–15.5)
WBC: 7.4 10*3/uL (ref 4.0–10.5)

## 2014-10-14 LAB — POCT URINALYSIS DIPSTICK
Bilirubin, UA: NEGATIVE
Glucose, UA: NEGATIVE
Ketones, UA: NEGATIVE
Nitrite, UA: NEGATIVE
Spec Grav, UA: 1.015
Urobilinogen, UA: 0.2
pH, UA: 6

## 2014-10-14 MED ORDER — CYANOCOBALAMIN 1000 MCG/ML IJ SOLN
1000.0000 ug | Freq: Once | INTRAMUSCULAR | Status: AC
  Administered 2014-10-14: 1000 ug via INTRAMUSCULAR

## 2014-10-14 MED ORDER — TRAMADOL HCL 50 MG PO TABS: ORAL_TABLET | ORAL | Status: DC

## 2014-10-14 NOTE — Assessment & Plan Note (Signed)
B12 shot today. 

## 2014-10-14 NOTE — Patient Instructions (Addendum)
Hopefully tramadol will help pain and sleep  B12 shot today  Lab today for cbc to make sure wbc count is not up from infection  Urinalysis today  Let me know when you are ready for a urology referral If headache returns please let us know

## 2014-10-14 NOTE — Progress Notes (Signed)
Pre visit review using our clinic review tool, if applicable. No additional management support is needed unless otherwise documented below in the visit note. 

## 2014-10-14 NOTE — Assessment & Plan Note (Signed)
R side of head  Had a headache that is now gone Disc fall precaution  If HA returns or dizziness/nausea or other symptoms -enc f/u and would consider CT No neuro s/s today

## 2014-10-14 NOTE — Assessment & Plan Note (Signed)
Now resolved  Check ua - and cx today Also cbc  Reassuring exam

## 2014-10-14 NOTE — Progress Notes (Signed)
Subjective:    Patient ID: Dawn Aguirre, female    DOB: February 05, 1926, 81 y.o.   MRN: [id number]  HPI Here with malaise  She also had a temp of 100 over the weekend   Sleeping poorly due to back pain  Has spinal stenosis  Not ready to see anyone for her back yet   Wiped out on Friday - went to bed with low grade temp  Has taken some tylenol  Very dry /uncomfortable throat - not sore  Bad cough - not productive  Runny nose started today  No sinus pain  Drank water and threw it back up (has been able to keep fluids down since then)  Very tired  Generalized body aches  Headache in the front and back of her head - right now it is gone  A lot of stress  Taking care of her son - getting help -not always enough   He is doing better - out of the wheelchair now     Bladder problems persist   Banged into a door this week -hit her head  Did not hit very hard - but has a bump there   Patient Active Problem List   Diagnosis Date Noted  . B12 deficiency 07/16/2014  . Vitamin D deficiency 07/16/2014  . Fatigue 07/04/2014  . Encounter for Medicare annual wellness exam 01/13/2014  . Rib pain on right side 12/24/2013  . Fall in home 12/24/2013  . Dysuria 12/19/2013  . History of breast cancer 12/18/2013  . Breast discharge 06/07/2013  . Left breast mass 04/03/2013  . Bloody discharge from nipple 03/11/2013  . Cerumen impaction 03/11/2013  . Thrombocytopenia, unspecified 02/22/2013  . Epiploic appendagitis 02/22/2013  . Abdominal pain, left lower quadrant 02/22/2013  . Dry mouth 02/04/2013  . Chest wall pain 12/25/2012  . Benign paroxysmal positional vertigo 09/14/2012  . Low back pain 09/07/2012  . Abnormal urinalysis 09/07/2012  . Constipation 11/04/2011  . UTI (lower urinary tract infection) 06/27/2011  . Aortic stenosis 08/31/2010  . Hyperlipidemia   . GASTRITIS 04/01/2009  . ABDOMINAL BLOATING 04/01/2009  . STRESS REACTION, ACUTE, WITH EMOTIONAL DISTURBANCE  10/12/2007  . [place] DISEASE, CERVICAL 10/12/2007  . Hypothyroidism 01/22/2007  . RAYNAUD'S SYNDROME 01/22/2007  . EXTERNAL HEMORRHOIDS 01/22/2007  . COPD 01/22/2007  . DIVERTICULOSIS, COLON 01/22/2007  . FIBROCYSTIC BREAST DISEASE 01/22/2007  . Osteoporosis 01/22/2007  . HYPERLIPIDEMIA 12/16/2006  . Essential hypertension 12/16/2006  . GERD 12/16/2006  . RHEUMATOID ARTHRITIS 12/16/2006  . DYSPNEA ON EXERTION 12/16/2006  . COUGH, CHRONIC 12/16/2006  . TOBACCO ABUSE, HX OF 12/16/2006  . HYSTERECTOMY, HX OF 12/16/2006   Past Medical History  Diagnosis Date  . Flatulence, eructation, and gas pain   . Chronic airway obstruction, not elsewhere classified   . Cough   . Degeneration of cervical intervertebral disc   . Diverticulosis of colon (without mention of hemorrhage)   . Other dyspnea and respiratory abnormality   . External hemorrhoids without mention of complication   . Diffuse cystic mastopathy   . Unspecified gastritis and gastroduodenitis without mention of hemorrhage   . Esophageal reflux   . Other and unspecified hyperlipidemia   . Unspecified essential hypertension   . Unspecified hypothyroidism   . Acquired absence of organ, genital organs   . Osteoporosis, unspecified   . Other acute sinusitis   . Raynaud's syndrome   . Rheumatoid arthritis(714.0)     Sero-negative  . Predominant disturbance of emotions   . Personal  history of tobacco use, presenting hazards to health   . Lichen sclerosus     back/abd/vulvar  . Spinal stenosis   . Colitis, ischemic 07/2000    ? infect  . Mastoiditis of right side 04/2005    admitted  . Fibula fracture 2006  . Hyperlipidemia   . Wears dentures     top  . Breast cancer     surgery at [facility]   Past Surgical History  Procedure Laterality Date  . Abdominal hysterectomy  1968  . Dexa  2001    OP,heel  . Cardiollite  02/2002    Neg  . Nasal sinus surgery  05/2002  . Dexa      OP hip T-2.94  . US echocardiography  10/06     normal LVF EF 55-65%  . Nuclear stress test  11/07    Normal  . Tonsillectomy    . Colonoscopy     Social History  Substance Use Topics  . Smoking status: Former Smoker    Quit date: 02/07/1962  . Smokeless tobacco: None  . Alcohol Use: 0.0 oz/week    0 Standard drinks or equivalent per week     Comment: occ wine   Family History  Problem Relation Age of Onset  . Heart attack Father 52  . Coronary artery disease Sister 37    CABG  . Heart attack Brother 24  . Coronary artery disease      GF   Allergies  Allergen Reactions  . Ace Inhibitors     REACTION: cough  . Ezetimibe     REACTION: myalgia  . Ezetimibe-Simvastatin     REACTION: joints swell  . Moxifloxacin   . Rosuvastatin     REACTION: myalgia  . Septra [Sulfamethoxazole-Trimethoprim]     Sick feeling   . Simvastatin     REACTION: myalgia  . Triaminic    Current Outpatient Prescriptions on File Prior to Visit  Medication Sig Dispense Refill  . ergocalciferol (VITAMIN D2) 50000 UNITS capsule Take 1 capsule (50,000 Units total) by mouth once a week. 12 capsule 0  . levothyroxine (SYNTHROID, LEVOTHROID) 112 MCG tablet Take 1 tablet (112 mcg total) by mouth daily before breakfast. 90 tablet 3  . Polyethylene Glycol 3350 (MIRALAX PO) Take by mouth.    . Probiotic Product (ALIGN PO) Take 1 tablet by mouth daily.     No current facility-administered medications on file prior to visit.    Review of Systems Review of Systems  Constitutional: pos for malaise and fatigue , pos for fever one day that is now resolved ENT neg for sinus pain, pos for clear rhinorrhea  Eyes: Negative for pain and visual disturbance.  Respiratory: Negative for wheeze  and shortness of breath.   Cardiovascular: Negative for cp or palpitations    Gastrointestinal: Negative for nausea, diarrhea and pos for baseline constipation  Genitourinary: pos for urgency and frequency. Neg for hematuria or dysuria  Skin: Negative for pallor or rash     Neurological: Negative for weakness, light-headedness, numbness and headaches. (HA is resolved) Hematological: Negative for adenopathy. Does not bruise/bleed easily.  Psychiatric/Behavioral: Negative for dysphoric mood. The patient is not nervous/anxious.  pos for stressors and worry       Objective:   Physical Exam  Constitutional: She is oriented to person, place, and time. She appears well-developed and well-nourished. No distress.  Well appearing elderly female   HENT:  Head: Normocephalic and atraumatic.  Right Ear: External ear normal.  Left Ear: External ear normal.  Nose: Nose normal.  Mouth/Throat: Oropharynx is clear and moist. No oropharyngeal exudate.  No sinus tenderness No temporal tenderness  No TMJ tenderness  Eyes: Conjunctivae and EOM are normal. Pupils are equal, round, and reactive to light. Right eye exhibits no discharge. Left eye exhibits no discharge. No scleral icterus.  No nystagmus  Neck: Normal range of motion and full passive range of motion without pain. Neck supple. No JVD present. Carotid bruit is not present. No tracheal deviation present. No thyromegaly present.  Cardiovascular: Normal rate and regular rhythm.   Murmur heard. Pulmonary/Chest: Effort normal and breath sounds normal. No respiratory distress. She has no wheezes. She has no rales. She exhibits no tenderness.  Abdominal: Soft. Bowel sounds are normal. She exhibits no distension and no mass. There is no tenderness.  No suprapubic tenderness or fullness  No cva tenderness   Musculoskeletal: She exhibits no edema or tenderness.  Limited rom knees and LS   Lymphadenopathy:    She has no cervical adenopathy.  Neurological: She is alert and oriented to person, place, and time. She has normal strength and normal reflexes. She displays no atrophy and no tremor. No cranial nerve deficit or sensory deficit. She exhibits normal muscle tone. She displays a negative Romberg sign. Coordination and  gait normal.  No focal cerebellar signs   Skin: Skin is warm and dry. No rash noted. No pallor.  Small contusion on R side of head above temple with ecchymosis  Mildly tender    Psychiatric: Her behavior is normal. Thought content normal. Her mood appears anxious. Her affect is not blunt, not labile and not inappropriate. She does not exhibit a depressed mood.  Seems stressed           Assessment & Plan:   Problem List Items Addressed This Visit      Digestive   B12 deficiency    B12 shot today      Relevant Medications   cyanocobalamin ((VITAMIN B-12)) injection 1,000 mcg (Completed)     Other   Abnormal urinalysis    Pt has intermittent symptoms Has leukocytes in urine  cx sent -to update  Has had low grade temp one day   Enc urol f/u -pt does not want to see last urol but pref Dr Consuella Lose in the future if this is an option  She is not ready for ref yet -pend urine cx       Relevant Orders   Urine culture   Chronic pain - Primary    From arthritis of back (and spinal stenosis) as well as knees Keeping her awake  Will px tramadol-this works for her - but need to watch for constipation and falls She voiced understanding  Will use walker if unsteady as well       Relevant Medications   traMADol (ULTRAM) 50 MG tablet   Contusion of head    R side of head  Had a headache that is now gone Disc fall precaution  If HA returns or dizziness/nausea or other symptoms -enc f/u and would consider CT No neuro s/s today      Elevated temperature    Now resolved  Check ua - and cx today Also cbc  Reassuring exam      Relevant Orders   CBC with Differential/Platelet (Completed)   Urinary frequency    ua is pos for leukocytes  Pend cx  Would like new urologist in the future - she is  not ready for ref now       Relevant Orders   POCT urinalysis dipstick (Completed)

## 2014-10-14 NOTE — Assessment & Plan Note (Signed)
From arthritis of back (and spinal stenosis) as well as knees Keeping her awake  Will px tramadol-this works for her - but need to watch for constipation and falls She voiced understanding  Will use walker if unsteady as well

## 2014-10-14 NOTE — Assessment & Plan Note (Signed)
Pt has intermittent symptoms Has leukocytes in urine  cx sent -to update  Has had low grade temp one day   Enc urol f/u -pt does not want to see last urol but pref Dr Consuella Lose in the future if this is an option  She is not ready for ref yet -pend urine cx

## 2014-10-14 NOTE — Assessment & Plan Note (Signed)
ua is pos for leukocytes  Pend cx  Would like new urologist in the future - she is not ready for ref now

## 2014-10-16 LAB — URINE CULTURE
Colony Count: NO GROWTH
Organism ID, Bacteria: NO GROWTH

## 2014-11-08 ENCOUNTER — Other Ambulatory Visit: Payer: Self-pay | Admitting: Family Medicine

## 2014-11-10 NOTE — Telephone Encounter (Signed)
She finished the 12 week course -so she is done with it Now enc her to take 2000 iu vit D3 over the counter every day

## 2014-11-10 NOTE — Telephone Encounter (Signed)
Rx declined and pt notified why and to get OTC D3 at take 2000 iu qd

## 2014-11-10 NOTE — Telephone Encounter (Signed)
Electronic refill request, last refilled on 07/16/14 #12 with 0 refills, please advise if pt is suppose to keep taking it

## 2014-11-11 ENCOUNTER — Ambulatory Visit (INDEPENDENT_AMBULATORY_CARE_PROVIDER_SITE_OTHER): Payer: Medicare Other | Admitting: Family Medicine

## 2014-11-11 ENCOUNTER — Encounter: Payer: Self-pay | Admitting: Family Medicine

## 2014-11-11 VITALS — BP 144/72 | HR 78 | Temp 98.2°F | Ht 62.0 in | Wt 143.0 lb

## 2014-11-11 DIAGNOSIS — J209 Acute bronchitis, unspecified: Secondary | ICD-10-CM | POA: Diagnosis not present

## 2014-11-11 MED ORDER — BENZONATATE 200 MG PO CAPS: 200.0000 mg | ORAL_CAPSULE | Freq: Three times a day (TID) | ORAL | Status: DC | PRN

## 2014-11-11 MED ORDER — AZITHROMYCIN 250 MG PO TABS: ORAL_TABLET | ORAL | Status: DC

## 2014-11-11 MED ORDER — HYDROCODONE-HOMATROPINE 5-1.5 MG/5ML PO SYRP: 5.0000 mL | ORAL_SOLUTION | Freq: Every evening | ORAL | Status: DC | PRN

## 2014-11-11 NOTE — Progress Notes (Signed)
Pre visit review using our clinic review tool, if applicable. No additional management support is needed unless otherwise documented below in the visit note. 

## 2014-11-11 NOTE — Progress Notes (Signed)
Subjective:    Patient ID: Dawn Aguirre, female    DOB: 29-Jan-1926, 81 y.o.   MRN: [id number]  HPI Here for uri symptoms and malaise   2 weeks -not getting better   Keeping her up at night Chest is sore  Full/deep cough - prod of clear mucous  Feels rotten Also nasal congestion  Throat is irritated No appetite  Sinuses are not hurting but she has had some headaches -on and off   Low grade temp -not over 99   Taking delsym -helps to loosen phlegm   Patient Active Problem List   Diagnosis Date Noted  . Acute bronchitis 11/11/2014  . Chronic pain 10/14/2014  . Urinary frequency 10/14/2014  . Elevated temperature 10/14/2014  . Contusion of head 10/14/2014  . B12 deficiency 07/16/2014  . Vitamin D deficiency 07/16/2014  . Fatigue 07/04/2014  . Encounter for Medicare annual wellness exam 01/13/2014  . Rib pain on right side 12/24/2013  . Fall in home 12/24/2013  . Dysuria 12/19/2013  . History of breast cancer 12/18/2013  . Breast discharge 06/07/2013  . Left breast mass 04/03/2013  . Bloody discharge from nipple 03/11/2013  . Cerumen impaction 03/11/2013  . Thrombocytopenia, unspecified ([place]) 02/22/2013  . Epiploic appendagitis 02/22/2013  . Abdominal pain, left lower quadrant 02/22/2013  . Dry mouth 02/04/2013  . Chest wall pain 12/25/2012  . Benign paroxysmal positional vertigo 09/14/2012  . Low back pain 09/07/2012  . Abnormal urinalysis 09/07/2012  . Constipation 11/04/2011  . UTI (lower urinary tract infection) 06/27/2011  . Aortic stenosis 08/31/2010  . Hyperlipidemia   . GASTRITIS 04/01/2009  . ABDOMINAL BLOATING 04/01/2009  . STRESS REACTION, ACUTE, WITH EMOTIONAL DISTURBANCE 10/12/2007  . [place] DISEASE, CERVICAL 10/12/2007  . Hypothyroidism 01/22/2007  . RAYNAUD'S SYNDROME 01/22/2007  . EXTERNAL HEMORRHOIDS 01/22/2007  . COPD 01/22/2007  . DIVERTICULOSIS, COLON 01/22/2007  . FIBROCYSTIC BREAST DISEASE 01/22/2007  . Osteoporosis 01/22/2007  .  HYPERLIPIDEMIA 12/16/2006  . Essential hypertension 12/16/2006  . GERD 12/16/2006  . RHEUMATOID ARTHRITIS 12/16/2006  . DYSPNEA ON EXERTION 12/16/2006  . COUGH, CHRONIC 12/16/2006  . TOBACCO ABUSE, HX OF 12/16/2006  . HYSTERECTOMY, HX OF 12/16/2006   Past Medical History  Diagnosis Date  . Flatulence, eructation, and gas pain   . Chronic airway obstruction, not elsewhere classified   . Cough   . Degeneration of cervical intervertebral disc   . Diverticulosis of colon (without mention of hemorrhage)   . Other dyspnea and respiratory abnormality   . External hemorrhoids without mention of complication   . Diffuse cystic mastopathy   . Unspecified gastritis and gastroduodenitis without mention of hemorrhage   . Esophageal reflux   . Other and unspecified hyperlipidemia   . Unspecified essential hypertension   . Unspecified hypothyroidism   . Acquired absence of organ, genital organs   . Osteoporosis, unspecified   . Other acute sinusitis   . Raynaud's syndrome   . Rheumatoid arthritis(714.0)     Sero-negative  . Predominant disturbance of emotions   . Personal history of tobacco use, presenting hazards to health   . Lichen sclerosus     back/abd/vulvar  . Spinal stenosis   . Colitis, ischemic ([place]) 07/2000    ? infect  . Mastoiditis of right side 04/2005    admitted  . Fibula fracture 2006  . Hyperlipidemia   . Wears dentures     top  . Breast cancer ([place])     surgery at [facility]  Past Surgical History  Procedure Laterality Date  . Abdominal hysterectomy  1968  . Dexa  2001    OP,heel  . Cardiollite  02/2002    Neg  . Nasal sinus surgery  05/2002  . Dexa      OP hip T-2.94  . US echocardiography  10/06    normal LVF EF 55-65%  . Nuclear stress test  11/07    Normal  . Tonsillectomy    . Colonoscopy     Social History  Substance Use Topics  . Smoking status: Former Smoker    Quit date: 02/07/1962  . Smokeless tobacco: None  . Alcohol Use: 0.0 oz/week    0  Standard drinks or equivalent per week     Comment: occ wine   Family History  Problem Relation Age of Onset  . Heart attack Father 71  . Coronary artery disease Sister 61    CABG  . Heart attack Brother 8  . Coronary artery disease      GF   Allergies  Allergen Reactions  . Ace Inhibitors     REACTION: cough  . Ezetimibe     REACTION: myalgia  . Ezetimibe-Simvastatin     REACTION: joints swell  . Moxifloxacin   . Rosuvastatin     REACTION: myalgia  . Septra [Sulfamethoxazole-Trimethoprim]     Sick feeling   . Simvastatin     REACTION: myalgia  . Triaminic    Current Outpatient Prescriptions on File Prior to Visit  Medication Sig Dispense Refill  . levothyroxine (SYNTHROID, LEVOTHROID) 112 MCG tablet Take 1 tablet (112 mcg total) by mouth daily before breakfast. 90 tablet 3  . Polyethylene Glycol 3350 (MIRALAX PO) Take by mouth.    . Probiotic Product (ALIGN PO) Take 1 tablet by mouth daily.    . traMADol (ULTRAM) 50 MG tablet TAKE 1 TO 2 TABLETS BY MOUTH UP TO 3 TIMES A DAY AS NEEDED FOR PAIN 90 tablet 0   No current facility-administered medications on file prior to visit.    Review of Systems Review of Systems  Constitutional: pos for fatigue and malaise  ENT pos for cong and rhinorrhea and sinus pressure   Eyes: Negative for pain and visual disturbance.  Respiratory: Negative for wheeze  and shortness of breath.   Cardiovascular: Negative for cp or palpitations    Gastrointestinal: Negative for nausea, diarrhea and constipation.  Genitourinary: Negative for urgency and frequency.  Skin: Negative for pallor or rash   Neurological: Negative for weakness, light-headedness, numbness and headaches.  Hematological: Negative for adenopathy. Does not bruise/bleed easily.  Psychiatric/Behavioral: Negative for dysphoric mood. The patient is not nervous/anxious.  pos for stressors        Objective:   Physical Exam  Constitutional: She appears well-developed and  well-nourished. No distress.  Fatigued appearing elderly female with cough  HENT:  Head: Normocephalic and atraumatic.  Right Ear: External ear normal.  Left Ear: External ear normal.  Mouth/Throat: Oropharynx is clear and moist.  Nares are injected and congested  No sinus tenderness Clear rhinorrhea and post nasal drip   Eyes: Conjunctivae and EOM are normal. Pupils are equal, round, and reactive to light. Right eye exhibits no discharge. Left eye exhibits no discharge.  Neck: Normal range of motion. Neck supple.  Cardiovascular: Normal rate and normal heart sounds.   Pulmonary/Chest: Effort normal and breath sounds normal. No respiratory distress. She has no wheezes. She has no rales. She exhibits no tenderness.  Generally harsh  bs  Scant wheeze only at the end of forced exp one time No rales  Few scattered rhonchi Good air exch  Abdominal: She exhibits no distension.  Musculoskeletal: She exhibits no edema.  Lymphadenopathy:    She has no cervical adenopathy.  Neurological: She is alert.  Skin: Skin is warm and dry. No rash noted.  Psychiatric: She has a normal mood and affect.          Assessment & Plan:   Problem List Items Addressed This Visit      Respiratory   Acute bronchitis - Primary    With 2 weeks of symptoms - mainly prod bothersome cough worse at night No reactive airways Cover with zpak in light of length of illness and remote hx of ? Copd Update if wheezing occurs  Tessalon tid for cough  Hycodan for pm cough - with caution of sedation   Disc symptomatic care - see instructions on AVS  Update if not starting to improve in a week or if worsening

## 2014-11-11 NOTE — Patient Instructions (Signed)
Drink lots of fluids and rest  Take zpak for bronchitis  Try tessalon for cough - swallow one pill whole up to every 8 hours  For night time cough -try the hycodan with caution of sedation (it has hydrocodone)  Update if not starting to improve in a week or if worsening

## 2014-11-11 NOTE — Assessment & Plan Note (Signed)
With 2 weeks of symptoms - mainly prod bothersome cough worse at night No reactive airways Cover with zpak in light of length of illness and remote hx of ? Copd Update if wheezing occurs  Tessalon tid for cough  Hycodan for pm cough - with caution of sedation   Disc symptomatic care - see instructions on AVS  Update if not starting to improve in a week or if worsening

## 2014-11-13 ENCOUNTER — Telehealth: Payer: Self-pay | Admitting: Family Medicine

## 2014-11-13 MED ORDER — CEFDINIR 300 MG PO CAPS: 300.0000 mg | ORAL_CAPSULE | Freq: Two times a day (BID) | ORAL | Status: DC

## 2014-11-13 NOTE — Telephone Encounter (Signed)
Please clarify this- is she actually presyncopal? This doesn't sound typical for the medicine since she has taking it prev.  Does she have a rash or typical allergy sx? How is her fluid intake? Let me know.  Thanks.

## 2014-11-13 NOTE — Telephone Encounter (Signed)
Don't take anymore of the zpak I will send cefdinir to her pharmacy- for 5 more days of abx

## 2014-11-13 NOTE — Telephone Encounter (Signed)
Pt notified Rx sent to pharmacy and to start new abx and stop old abx, pt verbalized understanding

## 2014-11-13 NOTE — Telephone Encounter (Signed)
Patient Name: Dawn Aguirre  DOB: 28-May-1925    Initial Comment Caller states, saw the Dr on Monday, she seems to be having a problem with the Zpack Rx, this time her Rx was filled with large tablets, which was dif from the normal form. She now is light headed, dizzy, scratchy throat, itching.    Nurse Assessment  Nurse: Mallie Mussel, RN, Alveta Heimlich Date/Time Eilene Ghazi Time): 11/13/2014 12:39:55 PM  Confirm and document reason for call. If symptomatic, describe symptoms. ---Caller states that she has been prescribed Azithormycin 250mg . She has taken the Zpk before. These are causing her to be dizzy, but not to the point of having to hang on to anything to walk. These pills are larger than what she has taken before. She is being treated for bronchitis. She still has the cough. Denies difficulty breathing. She does not want to take any more of this until she can speak with someone about this. She has also had some itching under both arms. This medication has not bothered her before. She denies dizziness at present. She does not have any symptoms at this time. Advised her that I will forward a message to the office for her and request that they give her a call. She verbalized understanding. She states that is what she wanted.  Has the patient traveled out of the country within the last 30 days? ---Not Applicable  Does the patient have any new or worsening symptoms? ---No     Guidelines    Guideline Title Affirmed Question Affirmed Notes       Final Disposition User   Clinical Call Mallie Mussel, RN, Alveta Heimlich

## 2014-11-13 NOTE — Telephone Encounter (Signed)
Spoke with pt and pt wanted to wait until Dr. Glori Bickers reviews message and sees what she says (is okay waiting until tomorrow, she knows Dr. Glori Bickers is off today)  Pt said she hasn't taking anymore abx today and her sxs are a lot better, pt said at 1st she was dizzy, not presyncopal just dizzy, and she hasn't eaten that much but she has been drinking a lot of water, pt said they have changed pharmacies and thinks she just had a reaction to the z-pak because it looks different then the z-pak she has taken before  Pt said she is still a little shaky feeling but the dizziness is gone and she is feeling better but is requesting a new abx sent to her pharmacy because she still has the cough/congestion that she was seen for

## 2014-12-18 ENCOUNTER — Telehealth: Payer: Self-pay | Admitting: Family Medicine

## 2014-12-18 NOTE — Telephone Encounter (Signed)
Spoke with pt; pt feeling better now, no H/A,dizziness,CPor SOB. Pt took BP while on phone and now BP 162/80. Pt is trying to rest this afternoon. Pt scheduled appt 12/19/14 at 10:15 with Dr Glori Bickers; if pt condition changes or worsens prior to appt pt will go to UC or Ed. Dr Glori Bickers out of office today; will route to Dr Glori Bickers and Dr. Silvio Pate.

## 2014-12-18 NOTE — Telephone Encounter (Signed)
PLEASE NOTE: All timestamps contained within this report are represented as [place] Standard Time. CONFIDENTIALTY NOTICE: This fax transmission is intended only for the addressee. It contains information that is legally privileged, confidential or otherwise protected from use or disclosure. If you are not the intended recipient, you are strictly prohibited from reviewing, disclosing, copying using or disseminating any of this information or taking any action in reliance on or regarding this information. If you have received this fax in error, please notify us immediately by telephone so that we can arrange for its return to [place]. Phone: [phone], Toll-Free: [phone], Fax: [phone] Page: 1 of 2 Call Id: [id number] [place] Patient Name: Dawn Aguirre Gender: Female DOB: 1925-08-19 Age: 81 Y 37 M Return Phone Number: [phone] (Primary), [phone] (Secondary) Address[place] [address] Client [facility] - Client Client Site [place] [facility] [place], [place] Contact Type Call Call Type Triage / Clinical Relationship To Patient Self Appointment Disposition EMR Appointment Not Necessary Info pasted into Epic Yes Return Phone Number [phone] (Primary) Chief Complaint Blood Pressure High Initial Comment Caller States her BP 174/88, has headache PreDisposition Call Doctor Nurse Assessment Nurse: Vallery Sa, RN, Tye [place] Date/Time (Eastern Time): 12/18/2014 12:50:06 PM Confirm and document reason for call. If symptomatic, describe symptoms. ---Caller states her blood pressure was 174/88 this morning and she developed a headache this morning that has gone away. She developed fever this morning (99.0 forehead this morning). No injury in the past 3 days. Has the patient traveled out of the country within the last  30 days? ---No Does the patient have any new or worsening symptoms? ---Yes Will a triage be completed? ---Yes Related visit to physician within the last 2 weeks? ---Yes Does the PT have any chronic conditions? (i.e. diabetes, asthma, etc.) ---Yes List chronic conditions. ---High Blood Pressure in the past, Breast surgery, Shoulder problems, Guidelines Guideline Title Affirmed Question Affirmed Notes Nurse Date/Time (Eastern Time) High Blood Pressure [1] BP # 160 / 100 AND [2] cardiac or neurologic symptoms (e.g., chest pain, difficulty breathing, unsteady gait, blurred vision) [place], RN, Cathy 12/18/2014 12:54:21 PM Disp. Time Eilene Ghazi Time) Disposition Final User 12/18/2014 12:56:40 PM Go to ED Now Yes Vallery Sa, RN, Tye [place] PLEASE NOTE: All timestamps contained within this report are represented as [place] Standard Time. CONFIDENTIALTY NOTICE: This fax transmission is intended only for the addressee. It contains information that is legally privileged, confidential or otherwise protected from use or disclosure. If you are not the intended recipient, you are strictly prohibited from reviewing, disclosing, copying using or disseminating any of this information or taking any action in reliance on or regarding this information. If you have received this fax in error, please notify us immediately by telephone so that we can arrange for its return to [place]. Phone: [phone], Toll-Free: [phone], Fax: [phone] Page: 2 of 2 Call Id: [id number] Caller Understands: Yes Disagree/Comply: Disagree Disagree/Comply Reason: Disagree with instructions Care Advice Given Per Guideline GO TO ED NOW: You need to be seen in the Emergency Department. Go to the ER at ___________ [place] now. Drive carefully. NOTE TO TRIAGER - DRIVING: * Another adult should drive. CALL EMS 911 IF: * Patient passes out, starts acting confused or becomes too weak to stand. * You become worse. CARE ADVICE  given per High Blood Pressure (Adult) guideline. After Care Instructions Given Call Event Type User Date /  Time Description Comments User: Berton Mount, RN Date/Time Eilene Ghazi Time): 12/18/2014 1:05:16 PM Called the office backline and Ariel transferred me to an answering machine. Called back and there was no answer x2. Faxed report and entered into computer. User: Berton Mount, RN Date/Time Eilene Ghazi Time): 12/18/2014 1:10:24 PM Notified Aerial and she states she will notify MD or MD's nurse. Referrals GO TO FACILITY REFUSED

## 2014-12-18 NOTE — Telephone Encounter (Signed)
Dawn Aguirre, Team [employer] RN, called reporting the below information regarding the pt. BP was 174/88, with a headache and unsteady. She declined the ER deposition because she was taking care of an ill family member. She was informed that her PCP and/or RN would be in touch to get an update on how she was doing. Pt's Home phone is [phone] and cell phone [phone].

## 2014-12-18 NOTE — Telephone Encounter (Signed)
Glad to hear she is improved-I will see her then

## 2014-12-18 NOTE — Telephone Encounter (Signed)
Patient Name: Dawn Aguirre DOB: Oct 01, 1925 Initial Comment Caller States her BP 174/88, has headache Nurse Assessment Nurse: Vallery Sa, RN, Cathy Date/Time (Eastern Time): 12/18/2014 12:50:06 PM Confirm and document reason for call. If symptomatic, describe symptoms. ---Caller states her blood pressure was 174/88 this morning and she developed a headache this morning that has gone away. She developed fever this morning (99.0 forehead this morning). No injury in the past 3 days. Has the patient traveled out of the country within the last 30 days? ---No Does the patient have any new or worsening symptoms? ---Yes Will a triage be completed? ---Yes Related visit to physician within the last 2 weeks? ---Yes Does the PT have any chronic conditions? (i.e. diabetes, asthma, etc.) ---Yes List chronic conditions. ---High Blood Pressure in the past, Breast surgery, Shoulder problems, Guidelines Guideline Title Affirmed Question Affirmed Notes High Blood Pressure [1] BP # 160 / 100 AND [2] cardiac or neurologic symptoms (e.g., chest pain, difficulty breathing, unsteady gait, blurred vision) Final Disposition User Go to ED Now Vallery Sa, RN, [employer] the office backline and Ariel transferred me to an Psychologist, prison and probation services. Called back and was on hold for 8 minutes. Faxed report and will continue to try to notify someone via the office backline.  Referrals GO TO FACILITY REFUSED Disagree/Comply: Disagree Disagree/Comply Reason: Disagree with instructions

## 2014-12-19 ENCOUNTER — Ambulatory Visit (INDEPENDENT_AMBULATORY_CARE_PROVIDER_SITE_OTHER): Payer: Medicare Other | Admitting: Family Medicine

## 2014-12-19 ENCOUNTER — Encounter: Payer: Self-pay | Admitting: Family Medicine

## 2014-12-19 VITALS — BP 170/90 | HR 73 | Temp 98.1°F | Ht 62.0 in | Wt 145.5 lb

## 2014-12-19 DIAGNOSIS — R251 Tremor, unspecified: Secondary | ICD-10-CM | POA: Diagnosis not present

## 2014-12-19 DIAGNOSIS — E039 Hypothyroidism, unspecified: Secondary | ICD-10-CM

## 2014-12-19 DIAGNOSIS — I10 Essential (primary) hypertension: Secondary | ICD-10-CM | POA: Diagnosis not present

## 2014-12-19 LAB — COMPREHENSIVE METABOLIC PANEL
ALT: 13 U/L (ref 0–35)
AST: 16 U/L (ref 0–37)
Albumin: 4.1 g/dL (ref 3.5–5.2)
Alkaline Phosphatase: 48 U/L (ref 39–117)
BUN: 18 mg/dL (ref 6–23)
CO2: 30 mEq/L (ref 19–32)
Calcium: 9.3 mg/dL (ref 8.4–10.5)
Chloride: 105 mEq/L (ref 96–112)
Creatinine, Ser: 0.92 mg/dL (ref 0.40–1.20)
GFR: 61.28 mL/min (ref >60.00)
Glucose, Bld: 87 mg/dL (ref 70–99)
Potassium: 4.1 mEq/L (ref 3.5–5.1)
Sodium: 141 mEq/L (ref 135–145)
Total Bilirubin: 0.6 mg/dL (ref 0.2–1.2)
Total Protein: 6.7 g/dL (ref 6.0–8.3)

## 2014-12-19 LAB — TSH: TSH: 1.7 u[IU]/mL (ref 0.35–4.50)

## 2014-12-19 MED ORDER — OLMESARTAN MEDOXOMIL-HCTZ 20-12.5 MG PO TABS: 1.0000 | ORAL_TABLET | Freq: Every day | ORAL | Status: DC

## 2014-12-19 NOTE — Patient Instructions (Signed)
Start benicar 20-12.5 If blood pressures after 1-2 weeks are not improving then increase to 2 pills once daily  Watch sodium in diet  Try to reduce stress/ responsibilities and get some help and rest  Lab today Follow up in 2-4 weeks  Use your walker to prevent falls when needed

## 2014-12-19 NOTE — Progress Notes (Signed)
Subjective:    Patient ID: Dawn Aguirre, female    DOB: 1925/05/17, 81 y.o.   MRN: [id number]  HPI Here for elevated bp   It is very elevated  Headaches Shaky and balance is off  Very stressed/ many stressors and very upset about the election     bp at home running 180s=200 /low 100s  BP Readings from Last 3 Encounters:  12/19/14 170/90  11/11/14 144/72  10/14/14 146/78   Has been on bp med on and off in the past - norvasc, benicar hct  Hx of AS as well - she has been aware of that - exercise intolerance is the same   Neck has hurt/felt full and sweaty   One fall  She is using her walker now more / at home today    Having headaches   Lab Results  Component Value Date   TSH 4.42 07/11/2014     Patient Active Problem List   Diagnosis Date Noted  . Acute bronchitis 11/11/2014  . Chronic pain 10/14/2014  . Urinary frequency 10/14/2014  . Elevated temperature 10/14/2014  . Contusion of head 10/14/2014  . B12 deficiency 07/16/2014  . Vitamin D deficiency 07/16/2014  . Fatigue 07/04/2014  . Encounter for Medicare annual wellness exam 01/13/2014  . Rib pain on right side 12/24/2013  . Fall in home 12/24/2013  . Dysuria 12/19/2013  . History of breast cancer 12/18/2013  . Breast discharge 06/07/2013  . Left breast mass 04/03/2013  . Bloody discharge from nipple 03/11/2013  . Cerumen impaction 03/11/2013  . Thrombocytopenia, unspecified ([place]) 02/22/2013  . Epiploic appendagitis 02/22/2013  . Abdominal pain, left lower quadrant 02/22/2013  . Dry mouth 02/04/2013  . Chest wall pain 12/25/2012  . Benign paroxysmal positional vertigo 09/14/2012  . Low back pain 09/07/2012  . Abnormal urinalysis 09/07/2012  . Constipation 11/04/2011  . UTI (lower urinary tract infection) 06/27/2011  . Aortic stenosis 08/31/2010  . Hyperlipidemia   . GASTRITIS 04/01/2009  . ABDOMINAL BLOATING 04/01/2009  . STRESS REACTION, ACUTE, WITH EMOTIONAL DISTURBANCE 10/12/2007  .  [place] DISEASE, CERVICAL 10/12/2007  . Hypothyroidism 01/22/2007  . RAYNAUD'S SYNDROME 01/22/2007  . EXTERNAL HEMORRHOIDS 01/22/2007  . COPD 01/22/2007  . DIVERTICULOSIS, COLON 01/22/2007  . FIBROCYSTIC BREAST DISEASE 01/22/2007  . Osteoporosis 01/22/2007  . HYPERLIPIDEMIA 12/16/2006  . Essential hypertension 12/16/2006  . GERD 12/16/2006  . RHEUMATOID ARTHRITIS 12/16/2006  . DYSPNEA ON EXERTION 12/16/2006  . COUGH, CHRONIC 12/16/2006  . TOBACCO ABUSE, HX OF 12/16/2006  . HYSTERECTOMY, HX OF 12/16/2006   Past Medical History  Diagnosis Date  . Flatulence, eructation, and gas pain   . Chronic airway obstruction, not elsewhere classified   . Cough   . Degeneration of cervical intervertebral disc   . Diverticulosis of colon (without mention of hemorrhage)   . Other dyspnea and respiratory abnormality   . External hemorrhoids without mention of complication   . Diffuse cystic mastopathy   . Unspecified gastritis and gastroduodenitis without mention of hemorrhage   . Esophageal reflux   . Other and unspecified hyperlipidemia   . Unspecified essential hypertension   . Unspecified hypothyroidism   . Acquired absence of organ, genital organs   . Osteoporosis, unspecified   . Other acute sinusitis   . Raynaud's syndrome   . Rheumatoid arthritis(714.0)     Sero-negative  . Predominant disturbance of emotions   . Personal history of tobacco use, presenting hazards to health   . Lichen sclerosus  back/abd/vulvar  . Spinal stenosis   . Colitis, ischemic ([place]) 07/2000    ? infect  . Mastoiditis of right side 04/2005    admitted  . Fibula fracture 2006  . Hyperlipidemia   . Wears dentures     top  . Breast cancer ([place])     surgery at [facility]   Past Surgical History  Procedure Laterality Date  . Abdominal hysterectomy  1968  . Dexa  2001    OP,heel  . Cardiollite  02/2002    Neg  . Nasal sinus surgery  05/2002  . Dexa      OP hip T-2.94  . US echocardiography  10/06     normal LVF EF 55-65%  . Nuclear stress test  11/07    Normal  . Tonsillectomy    . Colonoscopy     Social History  Substance Use Topics  . Smoking status: Former Smoker    Quit date: 02/07/1962  . Smokeless tobacco: None  . Alcohol Use: 0.0 oz/week    0 Standard drinks or equivalent per week     Comment: occ wine   Family History  Problem Relation Age of Onset  . Heart attack Father 93  . Coronary artery disease Sister 74    CABG  . Heart attack Brother 78  . Coronary artery disease      GF   Allergies  Allergen Reactions  . Ace Inhibitors     REACTION: cough  . Ezetimibe     REACTION: myalgia  . Ezetimibe-Simvastatin     REACTION: joints swell  . Moxifloxacin   . Rosuvastatin     REACTION: myalgia  . Septra [Sulfamethoxazole-Trimethoprim]     Sick feeling   . Simvastatin     REACTION: myalgia  . Triaminic    Current Outpatient Prescriptions on File Prior to Visit  Medication Sig Dispense Refill  . HYDROcodone-homatropine (HYCODAN) 5-1.5 MG/5ML syrup Take 5 mLs by mouth at bedtime as needed for cough (caution of sedation). 120 mL 0  . levothyroxine (SYNTHROID, LEVOTHROID) 112 MCG tablet Take 1 tablet (112 mcg total) by mouth daily before breakfast. 90 tablet 3  . Polyethylene Glycol 3350 (MIRALAX PO) Take by mouth.    . Probiotic Product (ALIGN PO) Take 1 tablet by mouth daily.    . traMADol (ULTRAM) 50 MG tablet TAKE 1 TO 2 TABLETS BY MOUTH UP TO 3 TIMES A DAY AS NEEDED FOR PAIN 90 tablet 0   No current facility-administered medications on file prior to visit.    Review of Systems     Objective:   Physical Exam        Assessment & Plan:

## 2014-12-19 NOTE — Progress Notes (Signed)
Pre visit review using our clinic review tool, if applicable. No additional management support is needed unless otherwise documented below in the visit note. 

## 2014-12-20 DIAGNOSIS — R251 Tremor, unspecified: Secondary | ICD-10-CM | POA: Insufficient documentation

## 2014-12-20 NOTE — Assessment & Plan Note (Signed)
Mainly intentional hand tremor  Suspect worsened by stress and fatigue  Will check lab (incl tsh) since pt is also more anxious and bp is up  Close f/u planned

## 2014-12-20 NOTE — Assessment & Plan Note (Signed)
bp is elevated Poss multifactorial - with stressors/lack of sleep /AS/age  Will start on benicar 20-12.5 daily and titrate to 40-25 if needed (pt has been on this dose before) Disc lifestyle habits  Also getting time for self care  F/u planned  tsh today as well in light of tremor

## 2014-12-20 NOTE — Assessment & Plan Note (Signed)
Worse tremor lately  Check tsh

## 2014-12-23 ENCOUNTER — Encounter: Payer: Self-pay | Admitting: *Deleted

## 2014-12-31 ENCOUNTER — Ambulatory Visit (INDEPENDENT_AMBULATORY_CARE_PROVIDER_SITE_OTHER): Payer: Medicare Other | Admitting: Family Medicine

## 2014-12-31 ENCOUNTER — Encounter: Payer: Self-pay | Admitting: Family Medicine

## 2014-12-31 VITALS — BP 132/82 | HR 87 | Temp 98.3°F | Ht 62.0 in | Wt 143.5 lb

## 2014-12-31 DIAGNOSIS — R079 Chest pain, unspecified: Secondary | ICD-10-CM

## 2014-12-31 DIAGNOSIS — R0789 Other chest pain: Secondary | ICD-10-CM

## 2014-12-31 NOTE — Progress Notes (Signed)
Pre visit review using our clinic review tool, if applicable. No additional management support is needed unless otherwise documented below in the visit note. 

## 2014-12-31 NOTE — Patient Instructions (Signed)
It was nice to see you today.  This appears to be musculoskeletal in nature. Use tylenol and tramadol for your pain.  Your EKG was normal which is reassuring.  Follow up:  Return if symptoms worsen or fail to improve.  Take care  Dr. Lacinda Axon

## 2014-12-31 NOTE — Assessment & Plan Note (Signed)
EKG unremarkable. No signs of ischemia. History and physical exam consistent with MS K chest pain. Advise symptomatic care including Tylenol, tramadol.

## 2014-12-31 NOTE — Progress Notes (Signed)
Subjective:  Patient ID: Dawn Aguirre, female    DOB: [birth year]  Age: 81 y.o. MRN: [id number]  CC: Chest pain  HPI:  81 year old female with a complicated past medical history including rheumatoid arthritis, COPD, hypertension, aortic stenosis, GERD, history of gastritis presents with complaints of chest pain.  Patient reports that she's had chest pain which she thought was indigestion over the past 2-3 days. Pain is located in the mid sternum. She denies any feelings of heartburn. She states it occurs at rest and has been more persistent at night. No associated shortness of breath. Does not appear to be related to exertion. No relieving factors. Does appear to be exacerbated by stress. No reports of fevers, chills, abdominal pain. No other complaints today.  Social Hx   Social History   Social History  . Marital Status: Married    Spouse Name: N/A  . Number of Children: 3  . Years of Education: N/A   Occupational History  . Retired    Social History Main Topics  . Smoking status: Former Smoker    Quit date: 02/07/1962  . Smokeless tobacco: None  . Alcohol Use: 0.0 oz/week    0 Standard drinks or equivalent per week     Comment: occ wine  . Drug Use: No  . Sexual Activity: Not Asked   Other Topics Concern  . 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  Respiratory: Negative for shortness of breath.   Cardiovascular: Positive for chest pain.   Objective:  BP 132/82 mmHg  Pulse 87  Temp(Src) 98.3 F (36.8 C) (Oral)  Ht 5\' 2"  (1.575 m)  Wt 143 lb 8 oz (65.091 kg)  BMI 26.24 kg/m2  SpO2 94%  BP/Weight 12/31/2014 12/19/2014 123456  Systolic BP [id number] [id number]  Diastolic BP 82 90 72  Wt. (Lbs) 143.5 145.5 143  BMI 26.24 26.61 26.15   Physical Exam  Constitutional: She appears well-developed. No distress.  Cardiovascular: Normal rate  and regular rhythm.   3/6 systolic murmur at right 2nd ICS consistent with aortic stenosis.  Pulmonary/Chest: Effort normal. No respiratory distress. She has no wheezes. She has no rales. She exhibits tenderness.  Chest wall tenderness to palpation.  Neurological: She is alert.  Psychiatric: She has a normal mood and affect.  Vitals reviewed.  Lab Results  Component Value Date   WBC 7.4 10/14/2014   HGB 12.3 10/14/2014   HCT 36.2 10/14/2014   PLT 170.0 10/14/2014   GLUCOSE 87 12/19/2014   CHOL 270* 07/11/2014   TRIG 85.0 07/11/2014   HDL 65.20 07/11/2014   LDLDIRECT 211.2 07/13/2010   LDLCALC 188* 07/11/2014   ALT 13 12/19/2014   AST 16 12/19/2014   NA 141 12/19/2014   K 4.1 12/19/2014   CL 105 12/19/2014   CREATININE 0.92 12/19/2014   BUN 18 12/19/2014   CO2 30 12/19/2014   TSH 1.70 12/19/2014   ED ECG REPORT   Date: 12/31/2014  EKG Time: 12:45 PM  Rate: 67  Rhythm: normal sinus rhythm,  unchanged from previous tracings  Axis: Left axis deviation.  Intervals: Normal.   ST&T Change: No.   Narrative Interpretation: Normal sinus rhythm at a rate of 67. No ST or T-wave changes. Normal intervals.  Assessment & Plan:   Problem List Items Addressed This Visit    Chest pain -  Primary    EKG unremarkable. No signs of ischemia. History and physical exam consistent with MS K chest pain. Advise symptomatic care including Tylenol, tramadol.       Relevant Orders   EKG 12-Lead (Completed)      Follow-up: Return if symptoms worsen or fail to improve.  [place]

## 2015-01-19 ENCOUNTER — Ambulatory Visit (INDEPENDENT_AMBULATORY_CARE_PROVIDER_SITE_OTHER): Payer: Medicare Other | Admitting: Family Medicine

## 2015-01-19 ENCOUNTER — Encounter: Payer: Self-pay | Admitting: Family Medicine

## 2015-01-19 VITALS — BP 140/78 | HR 77 | Temp 98.6°F | Ht 62.0 in | Wt 142.8 lb

## 2015-01-19 DIAGNOSIS — I10 Essential (primary) hypertension: Secondary | ICD-10-CM

## 2015-01-19 DIAGNOSIS — J069 Acute upper respiratory infection, unspecified: Secondary | ICD-10-CM

## 2015-01-19 DIAGNOSIS — B9789 Other viral agents as the cause of diseases classified elsewhere: Secondary | ICD-10-CM

## 2015-01-19 LAB — BASIC METABOLIC PANEL
BUN: 18 mg/dL (ref 6–23)
CO2: 26 mEq/L (ref 19–32)
Calcium: 9.3 mg/dL (ref 8.4–10.5)
Chloride: 106 mEq/L (ref 96–112)
Creatinine, Ser: 1 mg/dL (ref 0.40–1.20)
GFR: 55.65 mL/min — ABNORMAL LOW (ref >60.00)
Glucose, Bld: 102 mg/dL — ABNORMAL HIGH (ref 70–99)
Potassium: 4.7 mEq/L (ref 3.5–5.1)
Sodium: 139 mEq/L (ref 135–145)

## 2015-01-19 MED ORDER — HYDROCODONE-HOMATROPINE 5-1.5 MG/5ML PO SYRP: 5.0000 mL | ORAL_SOLUTION | Freq: Every evening | ORAL | Status: DC | PRN

## 2015-01-19 NOTE — Patient Instructions (Signed)
Continue the benicar hct for blood pressure- you are improving  I think you have a viral upper respiratory tract infection (cold)- if symptoms worsen (or worse cough or fever) let me know  Try some mucinex with lots of water to loosen congestion  Warm compress on chest may help Breathe steam as well

## 2015-01-19 NOTE — Assessment & Plan Note (Signed)
Mild/reassuring exam  Disc symptomatic care - see instructions on AVS Update if not starting to improve in a week or if worsening  -rev s/s of pneumonia

## 2015-01-19 NOTE — Progress Notes (Signed)
Subjective:    Patient ID: Dawn Aguirre, female    DOB: 1925-09-07, 81 y.o.   MRN: [id number]  HPI Here for f/u of HTN   BP Readings from Last 3 Encounters:  01/19/15 150/76  12/31/14 132/82  12/19/14 170/90   overall quite improved  Is taking benicar hct 20-12.5 and tolerates well   Saw Dr Marsa Aris for cp on 11/23 -nl EKG  Thought to be musculoskeletal  Reassured  Got her fu shot at [facility] and got a sore arm   Feeling very tired / does not want to get up - she needs more rest  A lot to do - has help but still a lot and a lot of family coming in also  Keeping up with everything at home  Son has a cold   She is coughing a bit  Dragging  Runny nose is not too bad Some chills but not fever  Just does not feel very good   Patient Active Problem List   Diagnosis Date Noted  . Viral URI with cough 01/19/2015  . Chest pain 12/31/2014  . Tremor 12/20/2014  . Chronic pain 10/14/2014  . B12 deficiency 07/16/2014  . Vitamin D deficiency 07/16/2014  . Fatigue 07/04/2014  . Encounter for Medicare annual wellness exam 01/13/2014  . Fall in home 12/24/2013  . History of breast cancer 12/18/2013  . Left breast mass 04/03/2013  . Thrombocytopenia, unspecified ([place]) 02/22/2013  . Epiploic appendagitis 02/22/2013  . Dry mouth 02/04/2013  . Chest wall pain 12/25/2012  . Benign paroxysmal positional vertigo 09/14/2012  . Low back pain 09/07/2012  . Constipation 11/04/2011  . Aortic stenosis 08/31/2010  . Hyperlipidemia   . GASTRITIS 04/01/2009  . STRESS REACTION, ACUTE, WITH EMOTIONAL DISTURBANCE 10/12/2007  . [place] DISEASE, CERVICAL 10/12/2007  . Hypothyroidism 01/22/2007  . RAYNAUD'S SYNDROME 01/22/2007  . EXTERNAL HEMORRHOIDS 01/22/2007  . COPD 01/22/2007  . DIVERTICULOSIS, COLON 01/22/2007  . FIBROCYSTIC BREAST DISEASE 01/22/2007  . Osteoporosis 01/22/2007  . Essential hypertension 12/16/2006  . GERD 12/16/2006  . RHEUMATOID ARTHRITIS 12/16/2006  . DYSPNEA ON EXERTION  12/16/2006  . COUGH, CHRONIC 12/16/2006  . TOBACCO ABUSE, HX OF 12/16/2006  . HYSTERECTOMY, HX OF 12/16/2006   Past Medical History  Diagnosis Date  . Flatulence, eructation, and gas pain   . Chronic airway obstruction, not elsewhere classified   . Cough   . Degeneration of cervical intervertebral disc   . Diverticulosis of colon (without mention of hemorrhage)   . Other dyspnea and respiratory abnormality   . External hemorrhoids without mention of complication   . Diffuse cystic mastopathy   . Unspecified gastritis and gastroduodenitis without mention of hemorrhage   . Esophageal reflux   . Other and unspecified hyperlipidemia   . Unspecified essential hypertension   . Unspecified hypothyroidism   . Acquired absence of organ, genital organs   . Osteoporosis, unspecified   . Other acute sinusitis   . Raynaud's syndrome   . Rheumatoid arthritis(714.0)     Sero-negative  . Predominant disturbance of emotions   . Personal history of tobacco use, presenting hazards to health   . Lichen sclerosus     back/abd/vulvar  . Spinal stenosis   . Colitis, ischemic ([place]) 07/2000    ? infect  . Mastoiditis of right side 04/2005    admitted  . Fibula fracture 2006  . Hyperlipidemia   . Wears dentures     top  . Breast cancer ([place])  surgery at [facility]   Past Surgical History  Procedure Laterality Date  . Abdominal hysterectomy  1968  . Dexa  2001    OP,heel  . Cardiollite  02/2002    Neg  . Nasal sinus surgery  05/2002  . Dexa      OP hip T-2.94  . US echocardiography  10/06    normal LVF EF 55-65%  . Nuclear stress test  11/07    Normal  . Tonsillectomy    . Colonoscopy     Social History  Substance Use Topics  . Smoking status: Former Smoker    Quit date: 02/07/1962  . Smokeless tobacco: None  . Alcohol Use: 0.0 oz/week    0 Standard drinks or equivalent per week     Comment: occ wine   Family History  Problem Relation Age of Onset  . Heart attack Father 72  .  Coronary artery disease Sister 67    CABG  . Heart attack Brother 14  . Coronary artery disease      GF   Allergies  Allergen Reactions  . Ace Inhibitors     REACTION: cough  . Ezetimibe     REACTION: myalgia  . Ezetimibe-Simvastatin     REACTION: joints swell  . Moxifloxacin   . Rosuvastatin     REACTION: myalgia  . Septra [Sulfamethoxazole-Trimethoprim]     Sick feeling   . Simvastatin     REACTION: myalgia  . Triaminic    Current Outpatient Prescriptions on File Prior to Visit  Medication Sig Dispense Refill  . levothyroxine (SYNTHROID, LEVOTHROID) 112 MCG tablet Take 1 tablet (112 mcg total) by mouth daily before breakfast. 90 tablet 3  . olmesartan-hydrochlorothiazide (BENICAR HCT) 20-12.5 MG tablet Take 1 tablet by mouth daily. 30 tablet 11  . Polyethylene Glycol 3350 (MIRALAX PO) Take by mouth.    . Probiotic Product (ALIGN PO) Take 1 tablet by mouth daily.    . traMADol (ULTRAM) 50 MG tablet TAKE 1 TO 2 TABLETS BY MOUTH UP TO 3 TIMES A DAY AS NEEDED FOR PAIN 90 tablet 0   No current facility-administered medications on file prior to visit.    Review of Systems Review of Systems  Constitutional: Negative for fever, appetite change,  and unexpected weight change. pos for fatigue  ENT pos for cong/rhinorrhea and ST Eyes: Negative for pain and visual disturbance.  Respiratory: Negative for wheeze and shortness of breath.   Cardiovascular: Negative for cp or palpitations    Gastrointestinal: Negative for nausea, diarrhea and constipation.  Genitourinary: Negative for urgency and frequency.  Skin: Negative for pallor or rash   Neurological: Negative for weakness, light-headedness, numbness and headaches.  Hematological: Negative for adenopathy. Does not bruise/bleed easily.  Psychiatric/Behavioral: Negative for dysphoric mood. The patient is not nervous/anxious.         Objective:   Physical Exam  Constitutional: She appears well-developed and well-nourished. No  distress.  Well but fatigued appearing   HENT:  Head: Normocephalic and atraumatic.  Right Ear: External ear normal.  Left Ear: External ear normal.  Mouth/Throat: Oropharynx is clear and moist.  Nares are injected and congested  No sinus tenderness Clear rhinorrhea and post nasal drip   Eyes: Conjunctivae and EOM are normal. Pupils are equal, round, and reactive to light. Right eye exhibits no discharge. Left eye exhibits no discharge.  Neck: Normal range of motion. Neck supple. No JVD present. Carotid bruit is not present. No thyromegaly present.  Cardiovascular: Normal rate,  regular rhythm and intact distal pulses.  Exam reveals no gallop.   Murmur heard. Baseline AS M  Pulmonary/Chest: Effort normal and breath sounds normal. No respiratory distress. She has no wheezes. She has no rales. She exhibits tenderness.  No crackles  Mild sternal chest wall tenderness without crepitus or skin change   Abdominal: Soft. Bowel sounds are normal. She exhibits no distension, no abdominal bruit and no mass. There is no tenderness.  Musculoskeletal: She exhibits no edema.  Lymphadenopathy:    She has no cervical adenopathy.  Neurological: She is alert. She has normal reflexes.  Skin: Skin is warm and dry. No rash noted.  Psychiatric: She has a normal mood and affect.          Assessment & Plan:   Problem List Items Addressed This Visit      Cardiovascular and Mediastinum   Essential hypertension - Primary    Improving with benicar hct  BP: 140/78 mmHg  No symptoms  Can inc dose if needed  Disc lifestyle habits - DASH diet /exercise as tolerated  Lab today  Continue to follow       Relevant Orders   Basic metabolic panel (Completed)     Respiratory   Viral URI with cough    Mild/reassuring exam  Disc symptomatic care - see instructions on AVS Update if not starting to improve in a week or if worsening  -rev s/s of pneumonia

## 2015-01-19 NOTE — Assessment & Plan Note (Signed)
Improving with benicar hct  BP: 140/78 mmHg  No symptoms  Can inc dose if needed  Disc lifestyle habits - DASH diet /exercise as tolerated  Lab today  Continue to follow

## 2015-01-19 NOTE — Progress Notes (Signed)
Pre visit review using our clinic review tool, if applicable. No additional management support is needed unless otherwise documented below in the visit note. 

## 2015-01-20 ENCOUNTER — Encounter: Payer: Self-pay | Admitting: *Deleted

## 2015-01-21 ENCOUNTER — Telehealth: Payer: Self-pay

## 2015-01-21 MED ORDER — BENZONATATE 200 MG PO CAPS: 200.0000 mg | ORAL_CAPSULE | Freq: Three times a day (TID) | ORAL | Status: DC | PRN

## 2015-01-21 MED ORDER — AZITHROMYCIN 250 MG PO TABS: ORAL_TABLET | ORAL | Status: DC

## 2015-01-21 NOTE — Telephone Encounter (Signed)
I cannot give her a stronger cough medicine - unfortunately  We can add some tessalon which may help additionally  I want to px abx - zpack  I will send these to her pharmacy  Keep me posted - I would like to get a chest xray at the end of the week if not improving

## 2015-01-21 NOTE — Telephone Encounter (Signed)
Pt notified of Dr. Marliss Coots comments/ instructions and verbalized understanding, pt will start meds today and let us know if she isn't improving by Friday and she will stop by for a chest xray

## 2015-01-21 NOTE — Telephone Encounter (Signed)
Pt was seen on 01/19/15; the cough is keeping pt awake at night and now pt has prod cough with gray phlegm;cough med is not helping cough especially at night; very S/T; no fever, SOB or wheezing. Pt wants to know what to do.

## 2015-03-06 ENCOUNTER — Encounter: Payer: Self-pay | Admitting: Family Medicine

## 2015-03-06 ENCOUNTER — Ambulatory Visit (INDEPENDENT_AMBULATORY_CARE_PROVIDER_SITE_OTHER): Payer: Medicare Other | Admitting: Family Medicine

## 2015-03-06 ENCOUNTER — Ambulatory Visit (INDEPENDENT_AMBULATORY_CARE_PROVIDER_SITE_OTHER)
Admission: RE | Admit: 2015-03-06 | Discharge: 2015-03-06 | Disposition: A | Discharge status: Home / Self Care | Payer: Medicare Other | Source: Ambulatory Visit | Attending: Family Medicine | Admitting: Family Medicine

## 2015-03-06 VITALS — BP 142/84 | HR 82 | Temp 98.2°F | Wt 144.0 lb

## 2015-03-06 DIAGNOSIS — R251 Tremor, unspecified: Secondary | ICD-10-CM

## 2015-03-06 DIAGNOSIS — K219 Gastro-esophageal reflux disease without esophagitis: Secondary | ICD-10-CM

## 2015-03-06 DIAGNOSIS — R140 Abdominal distension (gaseous): Secondary | ICD-10-CM | POA: Diagnosis not present

## 2015-03-06 DIAGNOSIS — G8929 Other chronic pain: Secondary | ICD-10-CM

## 2015-03-06 DIAGNOSIS — K5901 Slow transit constipation: Secondary | ICD-10-CM

## 2015-03-06 DIAGNOSIS — E039 Hypothyroidism, unspecified: Secondary | ICD-10-CM | POA: Diagnosis not present

## 2015-03-06 LAB — COMPREHENSIVE METABOLIC PANEL
ALT: 16 U/L (ref 0–35)
AST: 22 U/L (ref 0–37)
Albumin: 4.4 g/dL (ref 3.5–5.2)
Alkaline Phosphatase: 55 U/L (ref 39–117)
BUN: 15 mg/dL (ref 6–23)
CO2: 31 mEq/L (ref 19–32)
Calcium: 9.7 mg/dL (ref 8.4–10.5)
Chloride: 102 mEq/L (ref 96–112)
Creatinine, Ser: 1.03 mg/dL (ref 0.40–1.20)
GFR: 53.76 mL/min — ABNORMAL LOW (ref >60.00)
Glucose, Bld: 94 mg/dL (ref 70–99)
Potassium: 3.9 mEq/L (ref 3.5–5.1)
Sodium: 139 mEq/L (ref 135–145)
Total Bilirubin: 0.6 mg/dL (ref 0.2–1.2)
Total Protein: 7.2 g/dL (ref 6.0–8.3)

## 2015-03-06 LAB — TSH: TSH: 7.78 u[IU]/mL — ABNORMAL HIGH (ref 0.35–4.50)

## 2015-03-06 MED ORDER — RANITIDINE HCL 150 MG PO CAPS: 150.0000 mg | ORAL_CAPSULE | Freq: Two times a day (BID) | ORAL | Status: DC

## 2015-03-06 MED ORDER — TRAMADOL HCL 50 MG PO TABS: ORAL_TABLET | ORAL | Status: DC

## 2015-03-06 NOTE — Progress Notes (Signed)
Pre visit review using our clinic review tool, if applicable. No additional management support is needed unless otherwise documented below in the visit note. 

## 2015-03-06 NOTE — Progress Notes (Signed)
Subjective:    Patient ID: Dawn Aguirre, female    DOB: 07-Mar-1925, 82 y.o.   MRN: [id number]  HPI Here today for multiple complaints   Has a tremor - R hand and arm  She had several episodes that lasted 15 min that were more severe than usual   Continues to be tired all the time  Does not sleep well   Joint pain/chronic pain continues -ongoing/takes tramadol   Wonders if thyroid needs to be checked Lab Results  Component Value Date   TSH 1.70 12/19/2014     Hx of constipation  Bloated more for 3 days than usual  Uses miralax and prune juice  bm 2-3 times per week  Pain is worse over upper abdomen this time  Not burning  No heartburn   Some feeling of dry throat/mucous in throat/clears throat Cough  No appetite  Makes herself eat - eggs/coffee/toast / sometimes skips lunch   There is much stress  Coordinating her son's care    Was taking tramadol- she does not take every day (out of it for 4 days)  Needs refill    Patient Active Problem List   Diagnosis Date Noted  . Abdominal bloating 03/06/2015  . Viral URI with cough 01/19/2015  . Chest pain 12/31/2014  . Tremor 12/20/2014  . Chronic pain 10/14/2014  . B12 deficiency 07/16/2014  . Vitamin D deficiency 07/16/2014  . Fatigue 07/04/2014  . Encounter for Medicare annual wellness exam 01/13/2014  . Fall in home 12/24/2013  . History of breast cancer 12/18/2013  . Left breast mass 04/03/2013  . Thrombocytopenia, unspecified ([place]) 02/22/2013  . Epiploic appendagitis 02/22/2013  . Dry mouth 02/04/2013  . Chest wall pain 12/25/2012  . Benign paroxysmal positional vertigo 09/14/2012  . Low back pain 09/07/2012  . Constipation 11/04/2011  . Aortic stenosis 08/31/2010  . Hyperlipidemia   . GASTRITIS 04/01/2009  . STRESS REACTION, ACUTE, WITH EMOTIONAL DISTURBANCE 10/12/2007  . [place] DISEASE, CERVICAL 10/12/2007  . Hypothyroidism 01/22/2007  . RAYNAUD'S SYNDROME 01/22/2007  . EXTERNAL HEMORRHOIDS  01/22/2007  . COPD 01/22/2007  . DIVERTICULOSIS, COLON 01/22/2007  . FIBROCYSTIC BREAST DISEASE 01/22/2007  . Osteoporosis 01/22/2007  . Essential hypertension 12/16/2006  . GERD 12/16/2006  . RHEUMATOID ARTHRITIS 12/16/2006  . DYSPNEA ON EXERTION 12/16/2006  . COUGH, CHRONIC 12/16/2006  . TOBACCO ABUSE, HX OF 12/16/2006  . HYSTERECTOMY, HX OF 12/16/2006   Past Medical History  Diagnosis Date  . Flatulence, eructation, and gas pain   . Chronic airway obstruction, not elsewhere classified   . Cough   . Degeneration of cervical intervertebral disc   . Diverticulosis of colon (without mention of hemorrhage)   . Other dyspnea and respiratory abnormality   . External hemorrhoids without mention of complication   . Diffuse cystic mastopathy   . Unspecified gastritis and gastroduodenitis without mention of hemorrhage   . Esophageal reflux   . Other and unspecified hyperlipidemia   . Unspecified essential hypertension   . Unspecified hypothyroidism   . Acquired absence of organ, genital organs   . Osteoporosis, unspecified   . Other acute sinusitis   . Raynaud's syndrome   . Rheumatoid arthritis(714.0)     Sero-negative  . Predominant disturbance of emotions   . Personal history of tobacco use, presenting hazards to health   . Lichen sclerosus     back/abd/vulvar  . Spinal stenosis   . Colitis, ischemic ([place]) 07/2000    ? infect  . Mastoiditis of  right side 04/2005    admitted  . Fibula fracture 2006  . Hyperlipidemia   . Wears dentures     top  . Breast cancer ([place])     surgery at [facility]   Past Surgical History  Procedure Laterality Date  . Abdominal hysterectomy  1968  . Dexa  2001    OP,heel  . Cardiollite  02/2002    Neg  . Nasal sinus surgery  05/2002  . Dexa      OP hip T-2.94  . US echocardiography  10/06    normal LVF EF 55-65%  . Nuclear stress test  11/07    Normal  . Tonsillectomy    . Colonoscopy     Social History  Substance Use Topics  . Smoking  status: Former Smoker    Quit date: 02/07/1962  . Smokeless tobacco: None  . Alcohol Use: 0.0 oz/week    0 Standard drinks or equivalent per week     Comment: occ wine   Family History  Problem Relation Age of Onset  . Heart attack Father 26  . Coronary artery disease Sister 3    CABG  . Heart attack Brother 56  . Coronary artery disease      GF   Allergies  Allergen Reactions  . Ace Inhibitors     REACTION: cough  . Ezetimibe     REACTION: myalgia  . Ezetimibe-Simvastatin     REACTION: joints swell  . Moxifloxacin   . Rosuvastatin     REACTION: myalgia  . Septra [Sulfamethoxazole-Trimethoprim]     Sick feeling   . Simvastatin     REACTION: myalgia  . Triaminic    Current Outpatient Prescriptions on File Prior to Visit  Medication Sig Dispense Refill  . azithromycin (ZITHROMAX Z-PAK) 250 MG tablet Take 2 pills by mouth today and then 1 pill daily for 4 days 6 tablet 0  . benzonatate (TESSALON) 200 MG capsule Take 1 capsule (200 mg total) by mouth 3 (three) times daily as needed for cough (swallow the pill whole- do not bite pill). 30 capsule 1  . HYDROcodone-homatropine (HYCODAN) 5-1.5 MG/5ML syrup Take 5 mLs by mouth at bedtime as needed for cough (caution of sedation). 120 mL 0  . levothyroxine (SYNTHROID, LEVOTHROID) 112 MCG tablet Take 1 tablet (112 mcg total) by mouth daily before breakfast. 90 tablet 3  . olmesartan-hydrochlorothiazide (BENICAR HCT) 20-12.5 MG tablet Take 1 tablet by mouth daily. 30 tablet 11  . Polyethylene Glycol 3350 (MIRALAX PO) Take by mouth.    . Probiotic Product (ALIGN PO) Take 1 tablet by mouth daily.     No current facility-administered medications on file prior to visit.    Review of Systems Review of Systems  Constitutional: Negative for fever,, and unexpected weight change. pos for general malaise and dec appetite ENT neg for dysphagia  Eyes: Negative for pain and visual disturbance.  Respiratory: Negative for cough and shortness  of breath.   Cardiovascular: Negative for cp or palpitations    Gastrointestinal: Negative for nausea, diarrhea and pos for constipation. Pos for bloating and epigastirc pain  Genitourinary: Negative for urgency and pos for baseline pm  frequency.  Skin: Negative for pallor or rash   MSK pos for joint and LS pain chronic  Neurological: Negative for weakness, light-headedness, numbness and headaches.  Hematological: Negative for adenopathy. Does not bruise/bleed easily.  Psychiatric/Behavioral: Negative for dysphoric mood. The patient is nervous/anxious.  pos for mod to severe stressors  Objective:   Physical Exam  Constitutional: She appears well-developed and well-nourished. No distress.  Well appearing but somewhat anxious elderly female  HENT:  Head: Normocephalic and atraumatic.  Mouth/Throat: Oropharynx is clear and moist.  Eyes: Conjunctivae and EOM are normal. Pupils are equal, round, and reactive to light.  Neck: Normal range of motion. Neck supple. No JVD present. Carotid bruit is not present. No thyromegaly present.  Cardiovascular: Normal rate, regular rhythm and intact distal pulses.  Exam reveals no gallop.   Murmur heard. Pulmonary/Chest: Effort normal and breath sounds normal. No respiratory distress. She has no wheezes. She has no rales. She exhibits no tenderness.  No crackles  Abdominal: Soft. Bowel sounds are normal. She exhibits no distension, no pulsatile liver, no abdominal bruit, no pulsatile midline mass and no mass. There is no hepatosplenomegaly. There is tenderness in the epigastric area. There is no rigidity, no rebound, no guarding, no CVA tenderness, no tenderness at McBurney's point and negative Murphy's sign.  Musculoskeletal: She exhibits no edema.  No joint deformity  Limited rom LS   Lymphadenopathy:    She has no cervical adenopathy.  Neurological: She is alert. She has normal reflexes. No cranial nerve deficit. She exhibits normal muscle  tone. Coordination normal.  No tremor today- of note this comes and goes   Skin: Skin is warm and dry. No rash noted. No pallor.  Psychiatric: Her behavior is normal. Thought content normal. Her mood appears anxious. Her affect is not blunt and not labile. Cognition and memory are normal. She does not exhibit a depressed mood.  Attentive Disc stressors freely          Assessment & Plan:   Problem List Items Addressed This Visit      Digestive   Constipation    This is ongoing- and may be source of her bloating  Enc to continue miralax regimen-more aggressively if needed  abd xray today  If no improvement -consider GI f/u Also tx for GERD      GERD    Pt is experiencing epigastric pain/burning as well as globus sensation  This is problematic  Suspect acid reflux  Will begin zantac 150 mg bid and update  Rev GERD diet- what to avoid as well as avoiding pm eating before bed       Relevant Medications   ranitidine (ZANTAC) 150 MG capsule     Endocrine   Hypothyroidism    TSH today in light of trouble sleeping and worse tremor and general malaise  Adj dose prn of levothyroxine       Relevant Orders   TSH (Completed)     Other   Abdominal bloating    Suspect GERD and worsening constipation  abd xr today- r/o bowel obst or other  Will tx with zantac  miralax regimen       Relevant Orders   DG Abd 2 Views (Completed)   Chronic pain - Primary    Ongoing - joint and LS  Refilled tramadol which she uses sparingly with warning of sedation and falls Also has hydrocodone for more severe pain - again with caution  Will f/u ortho if needed       Relevant Medications   traMADol (ULTRAM) 50 MG tablet   Tremor    Episodic in R hand w/o other neuro symptoms  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

## 2015-03-06 NOTE — Patient Instructions (Signed)
Start ranitidine (generic zantac) 150 mg twice daily for stomach acid/ throat symptoms and cough  Continue regimen for constipation Tramadol refilled  Xray of abdomen today  Lab for thyroid and chemistries today  Get regular meals with protein (even small) and increase your fluid intake   Let's see how things look and then make a plan

## 2015-03-08 NOTE — Assessment & Plan Note (Signed)
TSH today in light of trouble sleeping and worse tremor and general malaise  Adj dose prn of levothyroxine

## 2015-03-08 NOTE — Assessment & Plan Note (Signed)
Pt is experiencing epigastric pain/burning as well as globus sensation  This is problematic  Suspect acid reflux  Will begin zantac 150 mg bid and update  Rev GERD diet- what to avoid as well as avoiding pm eating before bed

## 2015-03-08 NOTE — Assessment & Plan Note (Signed)
Suspect GERD and worsening constipation  abd xr today- r/o bowel obst or other  Will tx with zantac  miralax regimen

## 2015-03-08 NOTE — Assessment & Plan Note (Signed)
Ongoing - joint and LS  Refilled tramadol which she uses sparingly with warning of sedation and falls Also has hydrocodone for more severe pain - again with caution  Will f/u ortho if needed

## 2015-03-08 NOTE — Assessment & Plan Note (Signed)
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

## 2015-03-08 NOTE — Assessment & Plan Note (Signed)
This is ongoing- and may be source of her bloating  Enc to continue miralax regimen-more aggressively if needed  abd xray today  If no improvement -consider GI f/u Also tx for GERD

## 2015-03-09 ENCOUNTER — Telehealth: Payer: Self-pay | Admitting: *Deleted

## 2015-03-09 MED ORDER — LEVOTHYROXINE SODIUM 125 MCG PO TABS: 125.0000 ug | ORAL_TABLET | Freq: Every day | ORAL | Status: DC

## 2015-03-09 NOTE — Telephone Encounter (Signed)
-----   Message from [name], MD sent at 03/08/2015  9:15 AM EST ----- tsh is high  Unless she has missed doses- we need to inc her levothyroxine  Current dose 112 mcg  Please change to 125 mcg levothyroxine 1 po QD #30 11 ref  Check tsh in 4-6 wk please  Hope this will help some of her symptoms

## 2015-03-09 NOTE — Telephone Encounter (Signed)
Pt notified of lab results and Dr. Marliss Coots comments. Pt hasn't missed any doses of synthroid so new dose sent to pharmacy and lab appt scheduled

## 2015-04-01 ENCOUNTER — Other Ambulatory Visit: Payer: Self-pay | Admitting: Family Medicine

## 2015-04-01 DIAGNOSIS — I519 Heart disease, unspecified: Principal | ICD-10-CM

## 2015-04-01 DIAGNOSIS — E039 Hypothyroidism, unspecified: Secondary | ICD-10-CM

## 2015-04-07 ENCOUNTER — Other Ambulatory Visit (INDEPENDENT_AMBULATORY_CARE_PROVIDER_SITE_OTHER): Payer: Medicare Other

## 2015-04-07 DIAGNOSIS — E039 Hypothyroidism, unspecified: Secondary | ICD-10-CM

## 2015-04-07 LAB — TSH: TSH: 1.59 u[IU]/mL (ref 0.35–4.50)

## 2015-04-08 ENCOUNTER — Encounter: Payer: Self-pay | Admitting: *Deleted

## 2015-04-24 ENCOUNTER — Encounter: Payer: Self-pay | Admitting: Family Medicine

## 2015-04-24 ENCOUNTER — Ambulatory Visit (INDEPENDENT_AMBULATORY_CARE_PROVIDER_SITE_OTHER): Payer: Medicare Other | Admitting: Family Medicine

## 2015-04-24 VITALS — BP 140/80 | HR 79 | Temp 98.2°F | Ht 62.0 in | Wt 150.2 lb

## 2015-04-24 DIAGNOSIS — R140 Abdominal distension (gaseous): Secondary | ICD-10-CM

## 2015-04-24 DIAGNOSIS — N3000 Acute cystitis without hematuria: Secondary | ICD-10-CM | POA: Diagnosis not present

## 2015-04-24 DIAGNOSIS — F419 Anxiety disorder, unspecified: Secondary | ICD-10-CM | POA: Insufficient documentation

## 2015-04-24 DIAGNOSIS — N390 Urinary tract infection, site not specified: Secondary | ICD-10-CM | POA: Insufficient documentation

## 2015-04-24 DIAGNOSIS — R251 Tremor, unspecified: Secondary | ICD-10-CM

## 2015-04-24 DIAGNOSIS — F418 Other specified anxiety disorders: Secondary | ICD-10-CM | POA: Diagnosis not present

## 2015-04-24 DIAGNOSIS — R339 Retention of urine, unspecified: Secondary | ICD-10-CM | POA: Diagnosis not present

## 2015-04-24 LAB — POC URINALSYSI DIPSTICK (AUTOMATED)
Bilirubin, UA: NEGATIVE
Blood, UA: 25
Glucose, UA: NEGATIVE
Ketones, UA: NEGATIVE
Nitrite, UA: NEGATIVE
Spec Grav, UA: 1.015
Urobilinogen, UA: 0.2
pH, UA: 6

## 2015-04-24 MED ORDER — NITROFURANTOIN MONOHYD MACRO 100 MG PO CAPS: 100.0000 mg | ORAL_CAPSULE | Freq: Two times a day (BID) | ORAL | Status: DC

## 2015-04-24 NOTE — Progress Notes (Signed)
Pre visit review using our clinic review tool, if applicable. No additional management support is needed unless otherwise documented below in the visit note. 

## 2015-04-24 NOTE — Patient Instructions (Signed)
Here are our priorities:   1) urinary symptoms  Let's treat you for a uti and get a culture  If it is negative - we will have to get you to a new urologist  2) GI symptoms  Decide when you are ready to proceed with Dr Collene Mares for a visit - since the things we have done for your symptoms have not worked   3) Anxiety - having a talk and making a plan for treatment including (considering) counseling and then discussing medication choices   4) tremor - we can refer you to a neurologist to help [place] out with this and see if there is treatment to help

## 2015-04-24 NOTE — Progress Notes (Signed)
Subjective:    Patient ID: Dawn Aguirre, female    DOB: [birth year], 82 y.o.   MRN: [id number]  HPI Here with urinary symptoms -for more than 2 weeks   Up every 2 hours going to the bathroom  She feels hot  Nausea-dry heaves  Burns to urinate  No blood in urine  Some lower back pain  Has not seen urology in 1 year   Urine cx 9/16- no growth     Still has intermittent swelling of the abdomen  Uses mirlax - daily and prune juice  Is also making the effort to drink more water  Zantac did not help  She is not ready to go to GI yet however (has seen Dr Collene Mares in the past)      Results for orders placed or performed in visit on 04/24/15  POCT Urinalysis Dipstick (Automated)  Result Value Ref Range   Color, UA Yellow    Clarity, UA Hazy    Glucose, UA Neg.    Bilirubin, UA Neg.    Ketones, UA Neg.    Spec Grav, UA 1.015    Blood, UA 25 Ery/uL    pH, UA 6.0    Protein, UA Trace    Urobilinogen, UA 0.2    Nitrite, UA Neg.    Leukocytes, UA moderate (2+) (A) Negative       Is irritable/edgy and shaky (tremor really bothers her) -- she does use her walker when needed  More unsteady than usual  More aches and pains lately  Not sleeping well - then when she does sleep she sleeps a long time  "out of synch"  Throat is dry Worries about her son - but he is doing so much better (treating his uti helped)   Quite anxious- "not handling things well because she does not feel well" Declines counseling - does not think it would help    She is taking 2 tramadol at night - helps her pain    She is trying to give up some organizational responsibilities (gettting paperwork organized so someone else can take it over) -right now she is finding the person to take over that - likely her nephew  Also getting family copies for family - going back to history/records  Getting her house in order  Is getting more help - cleaning out a garage and 2 attics  Worries about her husband  also -he is 8     Thyroid is ok  Lab Results  Component Value Date   TSH 1.59 04/07/2015    Patient Active Problem List   Diagnosis Date Noted  . UTI (urinary tract infection) 04/24/2015  . Anxiety disorder 04/24/2015  . Abdominal bloating 03/06/2015  . Chest pain 12/31/2014  . Tremor 12/20/2014  . Chronic pain 10/14/2014  . B12 deficiency 07/16/2014  . Vitamin D deficiency 07/16/2014  . Fatigue 07/04/2014  . Encounter for Medicare annual wellness exam 01/13/2014  . Fall in home 12/24/2013  . History of breast cancer 12/18/2013  . Left breast mass 04/03/2013  . Thrombocytopenia, unspecified ([place]) 02/22/2013  . Epiploic appendagitis 02/22/2013  . Dry mouth 02/04/2013  . Chest wall pain 12/25/2012  . Benign paroxysmal positional vertigo 09/14/2012  . Low back pain 09/07/2012  . Constipation 11/04/2011  . Aortic stenosis 08/31/2010  . Hyperlipidemia   . GASTRITIS 04/01/2009  . STRESS REACTION, ACUTE, WITH EMOTIONAL DISTURBANCE 10/12/2007  . [place] DISEASE, CERVICAL 10/12/2007  . Hypothyroidism 01/22/2007  . RAYNAUD'S SYNDROME 01/22/2007  .  EXTERNAL HEMORRHOIDS 01/22/2007  . COPD 01/22/2007  . DIVERTICULOSIS, COLON 01/22/2007  . FIBROCYSTIC BREAST DISEASE 01/22/2007  . Osteoporosis 01/22/2007  . Essential hypertension 12/16/2006  . GERD 12/16/2006  . RHEUMATOID ARTHRITIS 12/16/2006  . DYSPNEA ON EXERTION 12/16/2006  . COUGH, CHRONIC 12/16/2006  . TOBACCO ABUSE, HX OF 12/16/2006  . HYSTERECTOMY, HX OF 12/16/2006   Past Medical History  Diagnosis Date  . Flatulence, eructation, and gas pain   . Chronic airway obstruction, not elsewhere classified   . Cough   . Degeneration of cervical intervertebral disc   . Diverticulosis of colon (without mention of hemorrhage)   . Other dyspnea and respiratory abnormality   . External hemorrhoids without mention of complication   . Diffuse cystic mastopathy   . Unspecified gastritis and gastroduodenitis without mention of  hemorrhage   . Esophageal reflux   . Other and unspecified hyperlipidemia   . Unspecified essential hypertension   . Unspecified hypothyroidism   . Acquired absence of organ, genital organs   . Osteoporosis, unspecified   . Other acute sinusitis   . Raynaud's syndrome   . Rheumatoid arthritis(714.0)     Sero-negative  . Predominant disturbance of emotions   . Personal history of tobacco use, presenting hazards to health   . Lichen sclerosus     back/abd/vulvar  . Spinal stenosis   . Colitis, ischemic ([place]) 07/2000    ? infect  . Mastoiditis of right side 04/2005    admitted  . Fibula fracture 2006  . Hyperlipidemia   . Wears dentures     top  . Breast cancer ([place])     surgery at [facility]   Past Surgical History  Procedure Laterality Date  . Abdominal hysterectomy  1968  . Dexa  2001    OP,heel  . Cardiollite  02/2002    Neg  . Nasal sinus surgery  05/2002  . Dexa      OP hip T-2.94  . US echocardiography  10/06    normal LVF EF 55-65%  . Nuclear stress test  11/07    Normal  . Tonsillectomy    . Colonoscopy     Social History  Substance Use Topics  . Smoking status: Former Smoker    Quit date: 02/07/1962  . Smokeless tobacco: None  . Alcohol Use: 0.0 oz/week    0 Standard drinks or equivalent per week     Comment: occ wine   Family History  Problem Relation Age of Onset  . Heart attack Father 52  . Coronary artery disease Sister 58    CABG  . Heart attack Brother 58  . Coronary artery disease      GF   Allergies  Allergen Reactions  . Ace Inhibitors     REACTION: cough  . Ezetimibe     REACTION: myalgia  . Ezetimibe-Simvastatin     REACTION: joints swell  . Moxifloxacin   . Rosuvastatin     REACTION: myalgia  . Septra [Sulfamethoxazole-Trimethoprim]     Sick feeling   . Simvastatin     REACTION: myalgia  . Triaminic    Current Outpatient Prescriptions on File Prior to Visit  Medication Sig Dispense Refill  . levothyroxine (SYNTHROID,  LEVOTHROID) 125 MCG tablet Take 1 tablet (125 mcg total) by mouth daily. 30 tablet 11  . Polyethylene Glycol 3350 (MIRALAX PO) Take by mouth.    . Probiotic Product (ALIGN PO) Take 1 tablet by mouth daily.    . traMADol (ULTRAM) 50  MG tablet TAKE 1 TO 2 TABLETS BY MOUTH UP TO 3 TIMES A DAY AS NEEDED FOR PAIN 90 tablet 0   No current facility-administered medications on file prior to visit.     Review of Systems Review of Systems  Constitutional: Negative for fever, appetite change, and unexpected weight change.  Eyes: Negative for pain and visual disturbance.  Respiratory: Negative for cough and shortness of breath.   Cardiovascular: Negative for cp or palpitations    Gastrointestinal: Negative for nausea, diarrhea and pos for constipation and bloating and occ epigastric pain .  Genitourinary: pos for urgency and frequency. pos for dysuria and bladder discomfort /neg for hematuria  Skin: Negative for pallor or rash   MSK pos for baseline back pain  Neurological: Negative for weakness, light-headedness, numbness and headaches. pos for tremor  Hematological: Negative for adenopathy. Does not bruise/bleed easily.  Psychiatric/Behavioral: Negative for dysphoric mood. The patient is nervous/anxious.         Objective:   Physical Exam  Constitutional: She appears well-developed and well-nourished. No distress.  Frail appearing elderly female  HENT:  Head: Normocephalic and atraumatic.  Mouth/Throat: Oropharynx is clear and moist.  Eyes: Conjunctivae and EOM are normal. Pupils are equal, round, and reactive to light. No scleral icterus.  Neck: Normal range of motion. Neck supple. No JVD present. Carotid bruit is not present. No thyromegaly present.  Cardiovascular: Normal rate, regular rhythm and intact distal pulses.  Exam reveals no gallop.   Murmur heard. Pulmonary/Chest: Effort normal and breath sounds normal. No respiratory distress. She has no wheezes. She has no rales.  No  crackles  bs are mildly distant   Abdominal: Soft. Bowel sounds are normal. She exhibits no distension, no abdominal bruit, no pulsatile midline mass and no mass. There is no hepatosplenomegaly. There is tenderness in the epigastric area and suprapubic area. There is no rigidity, no rebound, no guarding, no CVA tenderness, no tenderness at McBurney's point and negative Murphy's sign.  Musculoskeletal: She exhibits no edema.  Poor rom of spine  Lymphadenopathy:    She has no cervical adenopathy.  Neurological: She is alert. She has normal reflexes. She displays tremor. She displays no atrophy. No cranial nerve deficit or sensory deficit. She exhibits normal muscle tone. Coordination normal.  No bradykinesia  No cogwheel rigidity Gait is somewhat unsteady but not shuffling  Tremor in both hands today  Skin: Skin is warm and dry. No rash noted.  Psychiatric: Her speech is normal and behavior is normal. Her mood appears anxious. Her affect is not blunt, not labile and not inappropriate. Thought content is not paranoid. Cognition and memory are normal. She does not exhibit a depressed mood. She expresses no homicidal and no suicidal ideation.  Seems moderately anxious today but pleasant and attentive           Assessment & Plan:   Problem List Items Addressed This Visit      Genitourinary   UTI (urinary tract infection) - Primary    Recurrent symptoms - sometimes pos cx and other times neg  Cover with macrobid Pending urine cx Disc water intake  Highly enc to see urology if ucx is neg - she will want a new urologist       Relevant Medications   nitrofurantoin, macrocrystal-monohydrate, (MACROBID) 100 MG capsule   Other Relevant Orders   Urine culture (Completed)     Other   Abdominal bloating    This is ongoing More aggressive tx of constipation  has helped that but not bloating  Zantac has not helped this or symptoms of gastritis  I recommend ref to GI next -she is not ready  but will think about it       Anxiety disorder    Reviewed stressors/ coping techniques/symptoms/ support sources/ tx options and side effects in detail today She declines counseling right now  Rev need to talk to someone or at least write in a journal  Again disc need (at her age) to designate resp of some of her tasks to others to allow self care Rev her worries  She inq about xanax-disc the pot for habit and more importantly falls  Will f/u to disc further-unsure if ssri may be helpful or tricyclic (in light of poor sleep) at low dose >25 minutes spent in face to face time with patient, >50% spent in counselling or coordination of care       Tremor    Hand tremor  Worse with stress and fatigue  No bradycardia or gait changes  Suspect familial  Disc pot ref to neuro in the future to see if they have tx options        Other Visit Diagnoses    Urinary retention        Relevant Orders    POCT Urinalysis Dipstick (Automated) (Completed)

## 2015-04-25 LAB — URINE CULTURE
Colony Count: NO GROWTH
Organism ID, Bacteria: NO GROWTH

## 2015-04-26 NOTE — Assessment & Plan Note (Signed)
This is ongoing More aggressive tx of constipation has helped that but not bloating  Zantac has not helped this or symptoms of gastritis  I recommend ref to GI next -she is not ready but will think about it

## 2015-04-26 NOTE — Assessment & Plan Note (Addendum)
Hand tremor  Worse with stress and fatigue  No bradykinesia  or gait changes  Suspect familial  Disc pot ref to neuro in the future to see if they have tx options

## 2015-04-26 NOTE — Assessment & Plan Note (Signed)
Reviewed stressors/ coping techniques/symptoms/ support sources/ tx options and side effects in detail today She declines counseling right now  Rev need to talk to someone or at least write in a journal  Again disc need (at her age) to designate resp of some of her tasks to others to allow self care Rev her worries  She inq about xanax-disc the pot for habit and more importantly falls  Will f/u to disc further-unsure if ssri may be helpful or tricyclic (in light of poor sleep) at low dose >25 minutes spent in face to face time with patient, >50% spent in counselling or coordination of care

## 2015-04-26 NOTE — Assessment & Plan Note (Signed)
Recurrent symptoms - sometimes pos cx and other times neg  Cover with macrobid Pending urine cx Disc water intake  Highly enc to see urology if ucx is neg - she will want a new urologist

## 2015-07-10 ENCOUNTER — Ambulatory Visit (INDEPENDENT_AMBULATORY_CARE_PROVIDER_SITE_OTHER): Payer: Medicare Other | Admitting: Internal Medicine

## 2015-07-10 ENCOUNTER — Encounter: Payer: Self-pay | Admitting: Internal Medicine

## 2015-07-10 VITALS — BP 140/86 | HR 76 | Temp 98.7°F | Wt 150.0 lb

## 2015-07-10 DIAGNOSIS — F419 Anxiety disorder, unspecified: Secondary | ICD-10-CM

## 2015-07-10 DIAGNOSIS — M79671 Pain in right foot: Secondary | ICD-10-CM

## 2015-07-10 DIAGNOSIS — F418 Other specified anxiety disorders: Secondary | ICD-10-CM | POA: Diagnosis not present

## 2015-07-10 DIAGNOSIS — F329 Major depressive disorder, single episode, unspecified: Secondary | ICD-10-CM

## 2015-07-10 DIAGNOSIS — F32A Depression, unspecified: Secondary | ICD-10-CM

## 2015-07-10 MED ORDER — TRAMADOL HCL 50 MG PO TABS: ORAL_TABLET | ORAL | Status: DC

## 2015-07-10 MED ORDER — ESCITALOPRAM OXALATE 10 MG PO TABS: ORAL_TABLET | ORAL | Status: DC

## 2015-07-10 NOTE — Progress Notes (Signed)
Pre visit review using our clinic review tool, if applicable. No additional management support is needed unless otherwise documented below in the visit note. 

## 2015-07-10 NOTE — Progress Notes (Signed)
Subjective:    Patient ID: Dawn Aguirre, female    DOB: August 16, 1925, 82 y.o.   MRN: [id number]  HPI  Pt presents to the clinic today with c/o right heel pain. This started 2 weeks ago. She describes the pain as sharp and stabbing. The pain does radiate up into her calf. She denies redness, swelling, numbness or tingling. She did get heel inserts for her shoes which have helped. She has also tried heat and ice with minimal relief. She has a RX for Tramadol which she thinks would help, but she reports she has been out of it for 1 month, and does request a refill today.  She is also requesting something for her nerves. She reports she is very anxious and mildly depressed This is relatively new for her. She is about to celebrate her 67th wedding anniversary and is stressed about planning it. She will also be having 59 guest coming in from out a town and a good portion of them will be staying with her. The depression is associated with having to take care of her son, who is chronically ill. The anxiety and depression is causing her to have trouble sleeping at night. She denies SI/HI.  Review of Systems      Past Medical History  Diagnosis Date  . Flatulence, eructation, and gas pain   . Chronic airway obstruction, not elsewhere classified   . Cough   . Degeneration of cervical intervertebral disc   . Diverticulosis of colon (without mention of hemorrhage)   . Other dyspnea and respiratory abnormality   . External hemorrhoids without mention of complication   . Diffuse cystic mastopathy   . Unspecified gastritis and gastroduodenitis without mention of hemorrhage   . Esophageal reflux   . Other and unspecified hyperlipidemia   . Unspecified essential hypertension   . Unspecified hypothyroidism   . Acquired absence of organ, genital organs   . Osteoporosis, unspecified   . Other acute sinusitis   . Raynaud's syndrome   . Rheumatoid arthritis(714.0)     Sero-negative  . Predominant  disturbance of emotions   . Personal history of tobacco use, presenting hazards to health   . Lichen sclerosus     back/abd/vulvar  . Spinal stenosis   . Colitis, ischemic ([place]) 07/2000    ? infect  . Mastoiditis of right side 04/2005    admitted  . Fibula fracture 2006  . Hyperlipidemia   . Wears dentures     top  . Breast cancer ([place])     surgery at [facility]    Current Outpatient Prescriptions  Medication Sig Dispense Refill  . Cholecalciferol (VITAMIN D) 2000 units tablet Take 2,000 Units by mouth daily.    Marland Kitchen levothyroxine (SYNTHROID, LEVOTHROID) 125 MCG tablet Take 1 tablet (125 mcg total) by mouth daily. 30 tablet 11  . Polyethylene Glycol 3350 (MIRALAX PO) Take by mouth.    . Probiotic Product (ALIGN PO) Take 1 tablet by mouth daily.    . traMADol (ULTRAM) 50 MG tablet TAKE 1 TO 2 TABLETS BY MOUTH UP TO 3 TIMES A DAY AS NEEDED FOR PAIN (Patient not taking: Reported on 07/10/2015) 90 tablet 0   No current facility-administered medications for this visit.    Allergies  Allergen Reactions  . Ace Inhibitors     REACTION: cough  . Ezetimibe     REACTION: myalgia  . Ezetimibe-Simvastatin     REACTION: joints swell  . Macrobid [Nitrofurantoin]     ABD  PAIN, ALSO BODY ACHES/PAIN  . Moxifloxacin   . Rosuvastatin     REACTION: myalgia  . Septra [Sulfamethoxazole-Trimethoprim]     Sick feeling   . Simvastatin     REACTION: myalgia  . Triaminic     Family History  Problem Relation Age of Onset  . Heart attack Father 67  . Coronary artery disease Sister 35    CABG  . Heart attack Brother 33  . Coronary artery disease      GF    Social History   Social History  . Marital Status: Married    Spouse Name: N/A  . Number of Children: 3  . Years of Education: N/A   Occupational History  . Retired    Social History Main Topics  . Smoking status: Former Smoker    Quit date: 02/07/1962  . Smokeless tobacco: Not on file  . Alcohol Use: 0.0 oz/week    0 Standard drinks  or equivalent per week     Comment: occ wine  . Drug Use: No  . Sexual Activity: Not on file   Other Topics Concern  . Not on file   Social History Narrative   Retired      Lots of [employer], cares for disabled son     Constitutional: Denies fever, malaise, fatigue, headache or abrupt weight changes.  Musculoskeletal: Pt reports right heel pain. Denies decrease in range of motion, muscle pain or joint pain and swelling.  Skin: Denies redness, rashes, lesions or ulcercations.  Neurological: Denies dizziness, difficulty with memory, difficulty with speech or problems with balance and coordination.  Psych: Pt reports anxiety and depression. Denies SI/HI.  No other specific complaints in a complete review of systems (except as listed in HPI above).  Objective:   Physical Exam   BP 140/86 mmHg  Pulse 76  Temp(Src) 98.7 F (37.1 C) (Oral)  Wt 150 lb (68.04 kg)  SpO2 98% Wt Readings from Last 3 Encounters:  07/10/15 150 lb (68.04 kg)  04/24/15 150 lb 4 oz (68.153 kg)  03/06/15 144 lb (65.318 kg)    General: Appears her stated age, well developed, well nourished in NAD. Musculoskeletal: Normal flexion, extension and rotation of the right heel. No pain with palpation of the achilles tendon. Pain with palpation of the right heel. No difficulty with gait.  Neurological: Alert and oriented.  Psychiatric: She is a little anxious and tearful today.  BMET    Component Value Date/Time   NA 139 03/06/2015 1213   K 3.9 03/06/2015 1213   CL 102 03/06/2015 1213   CO2 31 03/06/2015 1213   GLUCOSE 94 03/06/2015 1213   BUN 15 03/06/2015 1213   CREATININE 1.03 03/06/2015 1213   CALCIUM 9.7 03/06/2015 1213   GFRNONAA 53* 05/30/2013 1200   GFRAA 62* 05/30/2013 1200    Lipid Panel     Component Value Date/Time   CHOL 270* 07/11/2014 0944   TRIG 85.0 07/11/2014 0944   HDL 65.20 07/11/2014 0944   CHOLHDL 4 07/11/2014 0944   VLDL 17.0 07/11/2014 0944   LDLCALC  188* 07/11/2014 0944    CBC    Component Value Date/Time   WBC 7.4 10/14/2014 1440   RBC 4.02 10/14/2014 1440   HGB 12.3 10/14/2014 1440   HCT 36.2 10/14/2014 1440   PLT 170.0 10/14/2014 1440   MCV 89.8 10/14/2014 1440   MCH 31.4 02/17/2013 1234   MCHC 34.0 10/14/2014 1440   RDW 12.7 10/14/2014 1440  LYMPHSABS 1.9 10/14/2014 1440   MONOABS 0.5 10/14/2014 1440   EOSABS 0.2 10/14/2014 1440   BASOSABS 0.0 10/14/2014 1440    Hgb A1C No results found for: HGBA1C      Assessment & Plan:   Right heel pain:  Likely bone spur Continue heel inserts Tramadol refilled today  Anxiety and Depression:  New- support offered today Discussed treatment options Will trial Lexapro 10 mg daily  Follow up with PCP in 1 month Anjolie Majer, NP

## 2015-07-11 NOTE — Patient Instructions (Signed)
Heel Spur  A heel spur is a bony growth that forms on the bottom of your heel bone (calcaneus). Heel spurs are common and do not always cause pain. However, heel spurs often cause inflammation in the strong band of tissue that runs underneath the bone of your foot (plantar fascia). When this happens, you may feel pain on the bottom of your foot, near your heel.   CAUSES   The cause of heel spurs is not completely understood. They may be caused by pressure on the heel. Or, they may stem from the muscle attachments (tendons) near the spur pulling on the heel.   RISK FACTORS  You may be at risk for a heel spur if you:  · Are older than 40.  · Are overweight.  · Have wear and tear arthritis (osteoarthritis).  · Have plantar fascia inflammation.  SIGNS AND SYMPTOMS   Some people have heel spurs but no symptoms. If you do have symptoms, they may include:   · Pain in the bottom of your heel.  · Pain that is worse when you first get out of bed.  · Pain that gets worse after walking or standing.  DIAGNOSIS   Your health care provider may diagnose a heel spur based on your symptoms and a physical exam. You may also have an X-ray of your foot to check for a bony growth coming from the calcaneus.   TREATMENT  Treatment aims to relieve the pain from the heel spur. This may include:  · Stretching exercises.  · Losing weight.  · Wearing specific shoes, inserts, or orthotics for comfort and support.  · Wearing splints at night to properly position your feet.  · Taking over-the-counter medicine to relieve pain.  · Being treated with high-intensity sound waves to break up the heel spur (extracorporeal shock wave therapy).  · Getting steroid injections in your heel to reduce swelling and ease pain.  · Having surgery if your heel spur causes long-term (chronic) pain.  HOME CARE INSTRUCTIONS   · Take medicines only as directed by your health care provider.  · Ask your health care provider if you should use ice or cold packs on the  painful areas of your heel or foot.  · Avoid activities that cause you pain until you recover or as directed by your health care provider.  · Stretch before exercising or being physically active.  · Wear supportive shoes that fit well as directed by your health care provider. You might need to buy new shoes. Wearing old shoes or shoes that do not fit correctly may not provide the support that you need.  · Lose weight if your health care provider thinks you should. This can relieve pressure on your foot that may be causing pain and discomfort.  SEEK MEDICAL CARE IF:   · Your pain continues or gets worse.     This information is not intended to replace advice given to you by your health care provider. Make sure you discuss any questions you have with your health care provider.     Document Released: 03/02/2005 Document Revised: 02/14/2014 Document Reviewed: 03/27/2013  Elsevier Interactive Patient Education ©2016 [employer]

## 2015-07-13 ENCOUNTER — Telehealth: Payer: Self-pay | Admitting: Family Medicine

## 2015-07-13 DIAGNOSIS — E538 Deficiency of other specified B group vitamins: Secondary | ICD-10-CM

## 2015-07-13 DIAGNOSIS — E039 Hypothyroidism, unspecified: Secondary | ICD-10-CM

## 2015-07-13 DIAGNOSIS — E785 Hyperlipidemia, unspecified: Secondary | ICD-10-CM

## 2015-07-13 DIAGNOSIS — D696 Thrombocytopenia, unspecified: Secondary | ICD-10-CM

## 2015-07-13 DIAGNOSIS — E559 Vitamin D deficiency, unspecified: Secondary | ICD-10-CM

## 2015-07-13 DIAGNOSIS — I10 Essential (primary) hypertension: Secondary | ICD-10-CM

## 2015-07-13 NOTE — Telephone Encounter (Signed)
-----   Message from Marchia Bond sent at 07/13/2015  1:42 PM EDT ----- Regarding: Cpx labs Fri 6/9, need orders. Thanks! :-) Please order  future cpx labs for pt's upcoming lab appt. Thanks Aniceto Boss

## 2015-07-17 ENCOUNTER — Other Ambulatory Visit: Payer: Medicare Other

## 2015-07-21 ENCOUNTER — Other Ambulatory Visit (INDEPENDENT_AMBULATORY_CARE_PROVIDER_SITE_OTHER): Payer: Medicare Other

## 2015-07-21 DIAGNOSIS — I10 Essential (primary) hypertension: Secondary | ICD-10-CM

## 2015-07-21 DIAGNOSIS — E559 Vitamin D deficiency, unspecified: Secondary | ICD-10-CM | POA: Diagnosis not present

## 2015-07-21 DIAGNOSIS — E538 Deficiency of other specified B group vitamins: Secondary | ICD-10-CM

## 2015-07-21 DIAGNOSIS — E785 Hyperlipidemia, unspecified: Secondary | ICD-10-CM | POA: Diagnosis not present

## 2015-07-21 DIAGNOSIS — D696 Thrombocytopenia, unspecified: Secondary | ICD-10-CM

## 2015-07-21 DIAGNOSIS — E039 Hypothyroidism, unspecified: Secondary | ICD-10-CM

## 2015-07-21 LAB — COMPREHENSIVE METABOLIC PANEL
ALT: 17 U/L (ref 0–35)
AST: 21 U/L (ref 0–37)
Albumin: 4.3 g/dL (ref 3.5–5.2)
Alkaline Phosphatase: 54 U/L (ref 39–117)
BUN: 18 mg/dL (ref 6–23)
CO2: 26 mEq/L (ref 19–32)
Calcium: 9.2 mg/dL (ref 8.4–10.5)
Chloride: 105 mEq/L (ref 96–112)
Creatinine, Ser: 1.12 mg/dL (ref 0.40–1.20)
GFR: 48.77 mL/min — ABNORMAL LOW (ref >60.00)
Glucose, Bld: 116 mg/dL — ABNORMAL HIGH (ref 70–99)
Potassium: 3.8 mEq/L (ref 3.5–5.1)
Sodium: 140 mEq/L (ref 135–145)
Total Bilirubin: 0.6 mg/dL (ref 0.2–1.2)
Total Protein: 7.1 g/dL (ref 6.0–8.3)

## 2015-07-21 LAB — LIPID PANEL
Cholesterol: 282 mg/dL — ABNORMAL HIGH (ref 0–200)
HDL: 71.8 mg/dL (ref >39.00)
LDL Cholesterol: 195 mg/dL — ABNORMAL HIGH (ref 0–99)
NonHDL: 209.97
Total CHOL/HDL Ratio: 4
Triglycerides: 75 mg/dL (ref 0.0–149.0)
VLDL: 15 mg/dL (ref 0.0–40.0)

## 2015-07-21 LAB — CBC WITH DIFFERENTIAL/PLATELET
Basophils Absolute: 0 10*3/uL (ref 0.0–0.1)
Basophils Relative: 0.5 % (ref 0.0–3.0)
Eosinophils Absolute: 0.3 10*3/uL (ref 0.0–0.7)
Eosinophils Relative: 4.2 % (ref 0.0–5.0)
HCT: 36.2 % (ref 36.0–46.0)
Hemoglobin: 11.9 g/dL — ABNORMAL LOW (ref 12.0–15.0)
Lymphocytes Relative: 38.4 % (ref 12.0–46.0)
Lymphs Abs: 2.4 10*3/uL (ref 0.7–4.0)
MCHC: 33.1 g/dL (ref 30.0–36.0)
MCV: 90.3 fl (ref 78.0–100.0)
Monocytes Absolute: 0.5 10*3/uL (ref 0.1–1.0)
Monocytes Relative: 8 % (ref 3.0–12.0)
Neutro Abs: 3 10*3/uL (ref 1.4–7.7)
Neutrophils Relative %: 48.9 % (ref 43.0–77.0)
Platelets: 162 10*3/uL (ref 150.0–400.0)
RBC: 4.01 Mil/uL (ref 3.87–5.11)
RDW: 13.6 % (ref 11.5–15.5)
WBC: 6.2 10*3/uL (ref 4.0–10.5)

## 2015-07-21 LAB — VITAMIN D 25 HYDROXY (VIT D DEFICIENCY, FRACTURES): VITD: 29.72 ng/mL — ABNORMAL LOW (ref 30.00–100.00)

## 2015-07-21 LAB — VITAMIN B12: Vitamin B-12: 291 pg/mL (ref 211–911)

## 2015-07-21 LAB — TSH: TSH: 15.56 u[IU]/mL — ABNORMAL HIGH (ref 0.35–4.50)

## 2015-07-22 ENCOUNTER — Ambulatory Visit (INDEPENDENT_AMBULATORY_CARE_PROVIDER_SITE_OTHER): Payer: Medicare Other | Admitting: Family Medicine

## 2015-07-22 ENCOUNTER — Encounter: Payer: Self-pay | Admitting: Family Medicine

## 2015-07-22 ENCOUNTER — Ambulatory Visit (INDEPENDENT_AMBULATORY_CARE_PROVIDER_SITE_OTHER): Payer: Medicare Other

## 2015-07-22 VITALS — BP 138/70 | HR 78 | Temp 98.2°F | Ht 61.5 in | Wt 146.8 lb

## 2015-07-22 DIAGNOSIS — D696 Thrombocytopenia, unspecified: Secondary | ICD-10-CM

## 2015-07-22 DIAGNOSIS — Z0000 Encounter for general adult medical examination without abnormal findings: Secondary | ICD-10-CM

## 2015-07-22 DIAGNOSIS — H9190 Unspecified hearing loss, unspecified ear: Secondary | ICD-10-CM | POA: Insufficient documentation

## 2015-07-22 DIAGNOSIS — E039 Hypothyroidism, unspecified: Secondary | ICD-10-CM

## 2015-07-22 DIAGNOSIS — R140 Abdominal distension (gaseous): Secondary | ICD-10-CM

## 2015-07-22 DIAGNOSIS — E785 Hyperlipidemia, unspecified: Secondary | ICD-10-CM

## 2015-07-22 DIAGNOSIS — I10 Essential (primary) hypertension: Secondary | ICD-10-CM

## 2015-07-22 DIAGNOSIS — E538 Deficiency of other specified B group vitamins: Secondary | ICD-10-CM

## 2015-07-22 DIAGNOSIS — Z853 Personal history of malignant neoplasm of breast: Secondary | ICD-10-CM

## 2015-07-22 DIAGNOSIS — R7303 Prediabetes: Secondary | ICD-10-CM | POA: Insufficient documentation

## 2015-07-22 DIAGNOSIS — F418 Other specified anxiety disorders: Secondary | ICD-10-CM

## 2015-07-22 DIAGNOSIS — R739 Hyperglycemia, unspecified: Secondary | ICD-10-CM

## 2015-07-22 DIAGNOSIS — M810 Age-related osteoporosis without current pathological fracture: Secondary | ICD-10-CM

## 2015-07-22 DIAGNOSIS — E559 Vitamin D deficiency, unspecified: Secondary | ICD-10-CM

## 2015-07-22 DIAGNOSIS — H9193 Unspecified hearing loss, bilateral: Secondary | ICD-10-CM

## 2015-07-22 MED ORDER — ESCITALOPRAM OXALATE 20 MG PO TABS: 20.0000 mg | ORAL_TABLET | Freq: Every day | ORAL | Status: DC

## 2015-07-22 MED ORDER — CYANOCOBALAMIN 1000 MCG/ML IJ SOLN
1000.0000 ug | Freq: Once | INTRAMUSCULAR | Status: AC
  Administered 2015-07-22: 1000 ug via INTRAMUSCULAR

## 2015-07-22 NOTE — Progress Notes (Signed)
PCP notes:  Health maintenance:  Mammogram - pt declined  Abnormal screenings:  Hearing - failed Vision - pt has cataracts in left eye Fall risk - hx of fall without injury within previous 12 mths  Patient concerns: Right heel pain and concerns with blood vessels in head  Nurse concerns: None  Next PCP appt: 07/22/2015 @ 1130  I reviewed health advisor's note, was available for consultation, and agree with documentation and plan.

## 2015-07-22 NOTE — Progress Notes (Signed)
Pre visit review using our clinic review tool, if applicable. No additional management support is needed unless otherwise documented below in the visit note. 

## 2015-07-22 NOTE — Patient Instructions (Signed)
Dawn Aguirre , Thank you for taking time to come for your Medicare Wellness Visit. I appreciate your ongoing commitment to your health goals. Please review the following plan we discussed and let me know if I can assist you in the future.   These are the goals we discussed: Goals    . Increase water intake     Starting 07/22/2015, I will drink at least 4-5 glasses per day.        This is a list of the screening recommended for you and due dates:  Health Maintenance  Topic Date Due  . Shingles Vaccine  03/11/2020*  . Tetanus Vaccine  03/11/2020*  . Mammogram  07/22/2023*  . Flu Shot  09/08/2015  . DEXA scan (bone density measurement)  Completed  . Pneumonia vaccines  Completed  *Topic was postponed. The date shown is not the original due date.    Preventive Care for Adults  A healthy lifestyle and preventive care can promote health and wellness. Preventive health guidelines for adults include the following key practices.  . A routine yearly physical is a good way to check with your health care provider about your health and preventive screening. It is a chance to share any concerns and updates on your health and to receive a thorough exam.  . Visit your dentist for a routine exam and preventive care every 6 months. Brush your teeth twice a day and floss once a day. Good oral hygiene prevents tooth decay and gum disease.  . The frequency of eye exams is based on your age, health, family medical history, use  of contact lenses, and other factors. Follow your health care provider's ecommendations for frequency of eye exams.  . Eat a healthy diet. Foods like vegetables, fruits, whole grains, low-fat dairy products, and lean protein foods contain the nutrients you need without too many calories. Decrease your intake of foods high in solid fats, added sugars, and salt. Eat the right amount of calories for you. Get information about a proper diet from your health care provider, if  necessary.  . Regular physical exercise is one of the most important things you can do for your health. Most adults should get at least 150 minutes of moderate-intensity exercise (any activity that increases your heart rate and causes you to sweat) each week. In addition, most adults need muscle-strengthening exercises on 2 or more days a week.  Silver Sneakers may be a benefit available to you. To determine eligibility, you may visit the website: www.silversneakers.com or contact program at 619-404-0834 Mon-Fri between 8AM-8PM.   . Maintain a healthy weight. The body mass index (BMI) is a screening tool to identify possible weight problems. It provides an estimate of body fat based on height and weight. Your health care provider can find your BMI and can help you achieve or maintain a healthy weight.   For adults 20 years and older: ? A BMI below 18.5 is considered underweight. ? A BMI of 18.5 to 24.9 is normal. ? A BMI of 25 to 29.9 is considered overweight. ? A BMI of 30 and above is considered obese.   . Maintain normal blood lipids and cholesterol levels by exercising and minimizing your intake of saturated fat. Eat a balanced diet with plenty of fruit and vegetables. Blood tests for lipids and cholesterol should begin at age 74 and be repeated every 5 years. If your lipid or cholesterol levels are high, you are over 50, or you are at high risk  for heart disease, you may need your cholesterol levels checked more frequently. Ongoing high lipid and cholesterol levels should be treated with medicines if diet and exercise are not working.  . If you smoke, find out from your health care provider how to quit. If you do not use tobacco, please do not start.  . If you choose to drink alcohol, please do not consume more than 2 drinks per day. One drink is considered to be 12 ounces (355 mL) of beer, 5 ounces (148 mL) of wine, or 1.5 ounces (44 mL) of liquor.  . If you are 60-18 years old, ask your  health care provider if you should take aspirin to prevent strokes.  . Use sunscreen. Apply sunscreen liberally and repeatedly throughout the day. You should seek shade when your shadow is shorter than you. Protect yourself by wearing long sleeves, pants, a wide-brimmed hat, and sunglasses year round, whenever you are outdoors.  . Once a month, do a whole body skin exam, using a mirror to look at the skin on your back. Tell your health care provider of new moles, moles that have irregular borders, moles that are larger than a pencil eraser, or moles that have changed in shape or color.

## 2015-07-22 NOTE — Patient Instructions (Addendum)
You need more water Increase your lexapro to 20 mg daily for anxiety (you are taking 10 - so take 2 pills once daily and then the new px will be for 20 mg) Also rest Have Dawn Aguirre start prepping meals for you and also help with medication dosing and reminders  Eat 3- 4 small meals per day = more protein and leafy greens See GI  Dr Collene Mares- when you are ready to discuss constipation  Make your eye doctor appt with Dr Dingeldein  Increase your vitamin D3 over the counter to 4000 iu every day  And B12 shot today  Schedule B12 shots every 6 weeks  Follow up with me in 6 weeks  See Dr Lorelei Pont if your heel continues to bother you  Cholesterol is still high

## 2015-07-22 NOTE — Progress Notes (Signed)
Subjective:    Patient ID: Dawn Aguirre, female    DOB: 1925/07/11, 82 y.o.   MRN: [id number]  HPI Here for health maintenance exam and to review chronic medical problems    Doing ok overall - tired from a lot going on Just had her 35 wedding anniv  Now she gets a little rest   Had AMW with [place] today Some hearing loss is noted worse in the R ear- not bothering her enough to evaluate further  inst to inc her water intake- knows to do this  (making an effort) Had one fall this year -knows what caused it / no injuries/out of state (of note 2 people fell in the same place) Is afraid of falling - is very very careful  Has both a cane and a walker - she uses them if she needs to on worse days  She stays very active    Wt is down 4 lb with bmi of 27 Not much appetite - does eat smaller meals now  Eats an egg every day  Has boost supplements as well if needed and cereal   Declines mammograms (hx of a mammogram)= had been stable at [facility]   Declines zostavax and tetanus shot   dexa 9/01-osteopenia- declines more bone density tests Not currently open to fx prev medication  Ht is decreased  One fall  Broke lower let years ago from a fall  Low D at 89- is on 2000 iu daily (has been on high dose therapy in the past)  Will double that dose   utd other imms   ifob kit 6/12 negative Colonoscopy diverticulosis 04 Does not want to continue screening for colon cancer  Still a lot of constipation (worse in the past 2 weeks with busy schedule) - using miralax at least 1-2 times every day and also trying to inc water  Also prune juice and correctol  Chronic bloating  Would consider seeing GI again - if this continues    Due for an eye exam Has cataracts Will make her own appt  bp is stable today  No cp or palpitations or headaches or edema  No side effects to medicines  BP Readings from Last 3 Encounters:  07/22/15 138/70  07/22/15 138/70  07/10/15 140/86       Glucose  is 116- does not want to limit diet due to small portions  Will watch this   Hx of B12 def Lab Results  Component Value Date   VITAMINB12 291 07/21/2015  had shots in the past  Not taking any oral B12    Hypothyroidism  Pt has no clinical changes No change in energy level/ hair or skin/ edema and no tremor Lab Results  Component Value Date   TSH 15.56* 07/21/2015    Thinks she has missed doses of thyroid medicine  She has been busy and forgotten  Will have her son's caregiver help her with her medication   Hx of thromocytopenia Lab Results  Component Value Date   WBC 6.2 07/21/2015   HGB 11.9* 07/21/2015   HCT 36.2 07/21/2015   MCV 90.3 07/21/2015   PLT 162.0 07/21/2015   diet is not as balanced  Needs more protein and dark leafy greens   Hx of hyperlipidemia Lab Results  Component Value Date   CHOL 282* 07/21/2015   CHOL 270* 07/11/2014   CHOL 200 04/24/2012   Lab Results  Component Value Date   HDL 71.80 07/21/2015   HDL  65.20 07/11/2014   HDL 62.20 04/24/2012   Lab Results  Component Value Date   LDLCALC 195* 07/21/2015   LDLCALC 188* 07/11/2014   LDLCALC 118* 04/24/2012   Lab Results  Component Value Date   TRIG 75.0 07/21/2015   TRIG 85.0 07/11/2014   TRIG 99.0 04/24/2012   Lab Results  Component Value Date   CHOLHDL 4 07/21/2015   CHOLHDL 4 07/11/2014   CHOLHDL 3 04/24/2012   Lab Results  Component Value Date   LDLDIRECT 211.2 07/13/2010   LDLDIRECT 128.6 10/12/2007   LDLDIRECT 175.4 03/07/2007  knows this is high  Diet has not made much of a difference in the past   Patient Active Problem List   Diagnosis Date Noted  . Routine general medical examination at a health care facility 07/22/2015  . Hearing loss 07/22/2015  . Hyperglycemia 07/22/2015  . Anxiety disorder 04/24/2015  . Abdominal bloating 03/06/2015  . Tremor 12/20/2014  . Chronic pain 10/14/2014  . B12 deficiency 07/16/2014  . Vitamin D deficiency 07/16/2014  . Fatigue  07/04/2014  . Encounter for Medicare annual wellness exam 01/13/2014  . Fall in home 12/24/2013  . History of breast cancer 12/18/2013  . Thrombocytopenia, unspecified ([place]) 02/22/2013  . Epiploic appendagitis 02/22/2013  . Dry mouth 02/04/2013  . Chest wall pain 12/25/2012  . Benign paroxysmal positional vertigo 09/14/2012  . Low back pain 09/07/2012  . Constipation 11/04/2011  . Aortic stenosis 08/31/2010  . Hyperlipidemia   . GASTRITIS 04/01/2009  . STRESS REACTION, ACUTE, WITH EMOTIONAL DISTURBANCE 10/12/2007  . [place] DISEASE, CERVICAL 10/12/2007  . Hypothyroidism 01/22/2007  . RAYNAUD'S SYNDROME 01/22/2007  . EXTERNAL HEMORRHOIDS 01/22/2007  . COPD 01/22/2007  . DIVERTICULOSIS, COLON 01/22/2007  . FIBROCYSTIC BREAST DISEASE 01/22/2007  . Osteoporosis 01/22/2007  . Essential hypertension 12/16/2006  . GERD 12/16/2006  . RHEUMATOID ARTHRITIS 12/16/2006  . DYSPNEA ON EXERTION 12/16/2006  . COUGH, CHRONIC 12/16/2006  . TOBACCO ABUSE, HX OF 12/16/2006  . HYSTERECTOMY, HX OF 12/16/2006   Past Medical History  Diagnosis Date  . Flatulence, eructation, and gas pain   . Chronic airway obstruction, not elsewhere classified   . Cough   . Degeneration of cervical intervertebral disc   . Diverticulosis of colon (without mention of hemorrhage)   . Other dyspnea and respiratory abnormality   . External hemorrhoids without mention of complication   . Diffuse cystic mastopathy   . Unspecified gastritis and gastroduodenitis without mention of hemorrhage   . Esophageal reflux   . Other and unspecified hyperlipidemia   . Unspecified essential hypertension   . Unspecified hypothyroidism   . Acquired absence of organ, genital organs   . Osteoporosis, unspecified   . Other acute sinusitis   . Raynaud's syndrome   . Rheumatoid arthritis(714.0)     Sero-negative  . Predominant disturbance of emotions   . Personal history of tobacco use, presenting hazards to health   . Lichen  sclerosus     back/abd/vulvar  . Spinal stenosis   . Colitis, ischemic ([place]) 07/2000    ? infect  . Mastoiditis of right side 04/2005    admitted  . Fibula fracture 2006  . Hyperlipidemia   . Wears dentures     top  . Breast cancer ([place])     surgery at [facility]   Past Surgical History  Procedure Laterality Date  . Abdominal hysterectomy  1968  . Dexa  2001    OP,heel  . Cardiollite  02/2002    Neg  .  Nasal sinus surgery  05/2002  . Dexa      OP hip T-2.94  . US echocardiography  10/06    normal LVF EF 55-65%  . Nuclear stress test  11/07    Normal  . Tonsillectomy    . Colonoscopy     Social History  Substance Use Topics  . Smoking status: Former Smoker    Quit date: 02/07/1962  . Smokeless tobacco: None  . Alcohol Use: 0.0 oz/week    0 Standard drinks or equivalent per week     Comment: occ wine   Family History  Problem Relation Age of Onset  . Heart attack Father 87  . Coronary artery disease Sister 9    CABG  . Heart attack Brother 64  . Coronary artery disease      GF   Allergies  Allergen Reactions  . Ace Inhibitors     REACTION: cough  . Ezetimibe     REACTION: myalgia  . Ezetimibe-Simvastatin     REACTION: joints swell  . Macrobid [Nitrofurantoin]     ABD PAIN, ALSO BODY ACHES/PAIN  . Moxifloxacin   . Rosuvastatin     REACTION: myalgia  . Septra [Sulfamethoxazole-Trimethoprim]     Sick feeling   . Simvastatin     REACTION: myalgia  . Triaminic    Current Outpatient Prescriptions on File Prior to Visit  Medication Sig Dispense Refill  . Cholecalciferol (VITAMIN D) 2000 units tablet Take 2,000 Units by mouth daily.    Marland Kitchen levothyroxine (SYNTHROID, LEVOTHROID) 125 MCG tablet Take 1 tablet (125 mcg total) by mouth daily. 30 tablet 11  . Polyethylene Glycol 3350 (MIRALAX PO) Take by mouth.    . Probiotic Product (ALIGN PO) Take 1 tablet by mouth daily.    . traMADol (ULTRAM) 50 MG tablet TAKE 1 TO 2 TABLETS BY MOUTH UP TO 3 TIMES A DAY AS NEEDED FOR  PAIN (Patient not taking: Reported on 07/22/2015) 90 tablet 0   No current facility-administered medications on file prior to visit.    Review of Systems    Review of Systems  Constitutional: Negative for fever, appetite change, and unexpected weight change. pos for fatigue  Eyes: Negative for pain and visual disturbance.  Respiratory: Negative for cough and shortness of breath.   Cardiovascular: Negative for cp or palpitations    Gastrointestinal: Negative for nausea, diarrhea and pos for chronic constipation.  Genitourinary: Negative for urgency and frequency.  Skin: Negative for pallor or rash   Neurological: Negative for weakness, light-headedness, numbness and headaches. pos for worsening balance and stable tremor  Hematological: Negative for adenopathy. Does not bruise/bleed easily.  Psychiatric/Behavioral: Negative for dysphoric mood. Pos for increasing anxiety    Objective:   Physical Exam  Constitutional: She appears well-developed and well-nourished. No distress.  Well but somewhat frail appearing elderly female  HENT:  Head: Normocephalic and atraumatic.  Right Ear: External ear normal.  Left Ear: External ear normal.  Mouth/Throat: Oropharynx is clear and moist.  Eyes: Conjunctivae and EOM are normal. Pupils are equal, round, and reactive to light. No scleral icterus.  Neck: Normal range of motion. Neck supple. No JVD present. Carotid bruit is not present. No thyromegaly present.  Cardiovascular: Normal rate, regular rhythm and intact distal pulses.  Exam reveals no gallop.   Murmur heard. Pulmonary/Chest: Effort normal and breath sounds normal. No respiratory distress. She has no wheezes. She has no rales. She exhibits no tenderness.  Abdominal: Soft. Bowel sounds are normal. She  exhibits no distension, no abdominal bruit and no mass. There is no tenderness. There is no rebound and no guarding.  Genitourinary: No breast swelling, tenderness, discharge or bleeding.  No  change in breast exam with baseline surgical changes  Musculoskeletal: Normal range of motion. She exhibits no edema or tenderness.  Mild kyphosis   Lymphadenopathy:    She has no cervical adenopathy.  Neurological: She is alert. She has normal reflexes. She displays tremor. No cranial nerve deficit. She exhibits normal muscle tone. Coordination normal.  Skin: Skin is warm and dry. No rash noted. No erythema. No pallor.  Psychiatric: Her speech is normal and behavior is normal. Thought content normal. Her mood appears anxious. She does not exhibit a depressed mood.          Assessment & Plan:   Problem List Items Addressed This Visit      Cardiovascular and Mediastinum   Essential hypertension - Primary    bp in fair control at this time  BP Readings from Last 1 Encounters:  07/22/15 138/70   No changes needed Disc lifstyle change with low sodium diet and exercise  Labs reviewed         Digestive   B12 deficiency    B12 shot today- and will schedule a shot every 6 weeks (pt has f/u in the office then)_ inst pt to start taking 1000 mcg of B12 otc daily        Relevant Medications   cyanocobalamin ((VITAMIN B-12)) injection 1,000 mcg (Completed)     Endocrine   Hypothyroidism    TSH is quite elevated  Suspect due to missed doses of her levothyroxine  Will f/u in 6 wk and re check it then        Nervous and Auditory   Hearing loss    Pt is aware and declines futher eval or tx at this time         Musculoskeletal and Integument   Osteoporosis    Rev fall and fx hx  She declines further eval/dexa or tx at this time Doubled her vit D intake /level is low again  Disc need for calcium/ vitamin D/ wt bearing exercise and bone density test every 2 y to monitor Disc safety/ fracture risk in detail          Other   Vitamin D deficiency    D is low today in pt with hx of OP  Will double her daily dose to 4000 iu daily       Thrombocytopenia, unspecified ([place])     Resolved on labs today  slt anemia - likely due to chronic disease       Routine general medical examination at a health care facility    Reviewed health habits including diet and exercise and skin cancer prevention Reviewed appropriate screening tests for age  Also reviewed health mt list, fam hx and immunization status , as well as social and family history   See HPI Reviewed AMW visit  Declines much screening at this time or further eval of hearing due to age Labs reviewed You need more water Increase your lexapro to 20 mg daily for anxiety (you are taking 10 - so take 2 pills once daily and then the new px will be for 20 mg) Also rest Have Felicia start prepping meals for you and also help with medication dosing and reminders  Eat 3- 4 small meals per day = more protein and leafy greens See GI  Dr Collene Mares- when you are ready to discuss constipation  Make your eye doctor appt with Dr Dingeldein  Increase your vitamin D3 over the counter to 4000 iu every day  And B12 shot today  Schedule B12 shots every 6 weeks      Hyperlipidemia    High lipids Disc goals for lipids and reasons to control them Rev labs with pt Rev low sat fat diet in detail At her age - declines tx and her diet is already fairly good  Will watch for sat fats       Hyperglycemia    Fasting glucose of 116 Disc need to watch sweets/simple sugars in diet  Disc eating 3-4 small meals per day with protein Will plan A1C at next visit       History of breast cancer    Pt is doing well and now declines further screening at her age       Anxiety disorder    Worse lately with age and medical problems  Reviewed stressors/ coping techniques/symptoms/ support sources/ tx options and side effects in detail today  Long disc again re: decreasing her obligations and getting help at home  Will inc her lexapro to 20 mg daily  Discussed expectations of SSRI medication including time to effectiveness and mechanism of  action, also poss of side effects (early and late)- including mental fuzziness, weight or appetite change, nausea and poss of worse dep or anxiety (even suicidal thoughts)  Pt voiced understanding and will stop med and update if this occurs  F/u in 6 wk        Abdominal bloating    With chronic constipation- worse lately due to busy schedule and dec water intake  Pt plans to f/u with GI if symptoms do not improve soon  Also declines colon cancer screening at her age Disc use of miralax and prune juice as well  Pain med may add to her symptoms at times

## 2015-07-22 NOTE — Progress Notes (Signed)
Subjective:   Dawn Aguirre is a 82 y.o. female who presents for Medicare Annual (Subsequent) preventive examination.  Review of Systems:  N/A Cardiac Risk Factors include: advanced age (>23men, >30 women);hypertension;dyslipidemia     Objective:     Vitals: BP 138/70 mmHg  Pulse 78  Temp(Src) 98.2 F (36.8 C) (Oral)  Ht 5' 1.5" (1.562 m)  Wt 146 lb 12 oz (66.565 kg)  BMI 27.28 kg/m2  SpO2 96%  Body mass index is 27.28 kg/(m^2).   Tobacco History  Smoking status  . Former Smoker  . Quit date: 02/07/1962  Smokeless tobacco  . Not on file     Counseling given: No   Past Medical History  Diagnosis Date  . Flatulence, eructation, and gas pain   . Chronic airway obstruction, not elsewhere classified   . Cough   . Degeneration of cervical intervertebral disc   . Diverticulosis of colon (without mention of hemorrhage)   . Other dyspnea and respiratory abnormality   . External hemorrhoids without mention of complication   . Diffuse cystic mastopathy   . Unspecified gastritis and gastroduodenitis without mention of hemorrhage   . Esophageal reflux   . Other and unspecified hyperlipidemia   . Unspecified essential hypertension   . Unspecified hypothyroidism   . Acquired absence of organ, genital organs   . Osteoporosis, unspecified   . Other acute sinusitis   . Raynaud's syndrome   . Rheumatoid arthritis(714.0)     Sero-negative  . Predominant disturbance of emotions   . Personal history of tobacco use, presenting hazards to health   . Lichen sclerosus     back/abd/vulvar  . Spinal stenosis   . Colitis, ischemic ([place]) 07/2000    ? infect  . Mastoiditis of right side 04/2005    admitted  . Fibula fracture 2006  . Hyperlipidemia   . Wears dentures     top  . Breast cancer ([place])     surgery at [facility]   Past Surgical History  Procedure Laterality Date  . Abdominal hysterectomy  1968  . Dexa  2001    OP,heel  . Cardiollite  02/2002    Neg  . Nasal sinus  surgery  05/2002  . Dexa      OP hip T-2.94  . US echocardiography  10/06    normal LVF EF 55-65%  . Nuclear stress test  11/07    Normal  . Tonsillectomy    . Colonoscopy     Family History  Problem Relation Age of Onset  . Heart attack Father 57  . Coronary artery disease Sister 74    CABG  . Heart attack Brother 61  . Coronary artery disease      GF   History  Sexual Activity  . Sexual Activity: No    Outpatient Encounter Prescriptions as of 07/22/2015  Medication Sig  . Cholecalciferol (VITAMIN D) 2000 units tablet Take 2,000 Units by mouth daily.  Marland Kitchen escitalopram (LEXAPRO) 10 MG tablet Take 1/2 tablet daily x 1 week then increase to a whole tablet daily  . levothyroxine (SYNTHROID, LEVOTHROID) 125 MCG tablet Take 1 tablet (125 mcg total) by mouth daily.  . Polyethylene Glycol 3350 (MIRALAX PO) Take by mouth.  . Probiotic Product (ALIGN PO) Take 1 tablet by mouth daily.  . traMADol (ULTRAM) 50 MG tablet TAKE 1 TO 2 TABLETS BY MOUTH UP TO 3 TIMES A DAY AS NEEDED FOR PAIN   No facility-administered encounter medications on file as  of 07/22/2015.    Activities of Daily Living In your present state of health, do you have any difficulty performing the following activities: 07/22/2015  Hearing? N  Difficulty concentrating or making decisions? N  Walking or climbing stairs? N  Dressing or bathing? N  Doing errands, shopping? N  Preparing Food and eating ? N  Using the Toilet? N  In the past six months, have you accidently leaked urine? N  Do you have problems with loss of bowel control? N  Managing your Medications? N  Managing your Finances? N  Housekeeping or managing your Housekeeping? N    Patient Care Team: [name], MD as PCP - General Dr. Sandra Cockayne - Ophthalmology  Assessment:     Hearing Screening   125Hz  250Hz  500Hz  1000Hz  2000Hz  4000Hz  8000Hz   Right ear:   0 0 40 0   Left ear:   0 0 40 40     Exercise Activities and Dietary  recommendations Current Exercise Habits: The patient does not participate in regular exercise at present, Exercise limited by: None identified  Goals    . Increase water intake     Starting 07/22/2015, I will drink at least 4-5 glasses per day.       Fall Risk Fall Risk  07/22/2015 01/13/2014  Falls in the past year? Yes Yes  Number falls in past yr: 1 1  Injury with Fall? No Yes  Follow up Falls evaluation completed -   Depression Screen PHQ 2/9 Scores 07/22/2015 01/13/2014  PHQ - 2 Score 0 0     Cognitive Testing MMSE - Mini Mental State Exam 07/22/2015  Orientation to time 5  Orientation to Place 5  Registration 3  Attention/ Calculation 0  Recall 3  Language- name 2 objects 0  Language- repeat 1  Language- follow 3 step command 3  Language- read & follow direction 0  Write a sentence 0  Copy design 0  Total score 20   PLEASE NOTE: A Mini-Cog screen was completed. Maximum score is 20. A value of 0 denotes this part of Folstein MMSE was not completed or the patient failed this part of the Mini-Cog screening.   Mini-Cog Screening Orientation to Time - Max 5 pts Orientation to Place - Max 5 pts Registration - Max 3 pts Recall - Max 3 pts Language Repeat - Max 1 pts Language Follow 3 Step Command - Max 3 pts  Immunization History  Administered Date(s) Administered  . Influenza Split 11/04/2011  . Influenza Whole 02/10/2006, 10/26/2007  . Influenza,inj,Quad PF,36+ Mos 11/22/2012, 12/17/2013  . Influenza-Unspecified 01/07/2015  . Pneumococcal Conjugate-13 01/13/2014  . Pneumococcal-Unspecified 01/13/1994   Screening Tests Health Maintenance  Topic Date Due  . ZOSTAVAX  03/11/2020 (Originally 04/17/1987)  . TETANUS/TDAP  03/11/2020 (Originally [birth year]1946)  . MAMMOGRAM  07/22/2023 (Originally 04/01/2014)  . INFLUENZA VACCINE  09/08/2015  . DEXA SCAN  Completed  . PNA vac Low Risk Adult  Completed      Plan:     I have personally reviewed and addressed the  Medicare Annual Wellness questionnaire and have noted the following in the patient's chart:  A. Medical and social history B. Use of alcohol, tobacco or illicit drugs  C. Current medications and supplements D. Functional ability and status E.  Nutritional status F.  Physical activity G. Advance directives H. List of other physicians I.  Hospitalizations, surgeries, and ER visits in previous 12 months J.  Vitals K. Screenings to include hearing, vision, cognitive, depression  L. Referrals and appointments - none  In addition, I have reviewed and discussed with patient certain preventive protocols, quality metrics, and best practice recommendations. A written personalized care plan for preventive services as well as general preventive health recommendations were provided to patient.  See attached scanned questionnaire for additional information.   Signed,   Lindell Noe, MHA, BS, LPN Health Advisor

## 2015-07-24 NOTE — Assessment & Plan Note (Signed)
With chronic constipation- worse lately due to busy schedule and dec water intake  Pt plans to f/u with GI if symptoms do not improve soon  Also declines colon cancer screening at her age Disc use of miralax and prune juice as well  Pain med may add to her symptoms at times

## 2015-07-24 NOTE — Assessment & Plan Note (Signed)
High lipids Disc goals for lipids and reasons to control them Rev labs with pt Rev low sat fat diet in detail At her age - declines tx and her diet is already fairly good  Will watch for sat fats

## 2015-07-24 NOTE — Assessment & Plan Note (Signed)
Fasting glucose of 116 Disc need to watch sweets/simple sugars in diet  Disc eating 3-4 small meals per day with protein Will plan A1C at next visit

## 2015-07-24 NOTE — Assessment & Plan Note (Signed)
TSH is quite elevated  Suspect due to missed doses of her levothyroxine  Will f/u in 6 wk and re check it then

## 2015-07-24 NOTE — Assessment & Plan Note (Signed)
Worse lately with age and medical problems  Reviewed stressors/ coping techniques/symptoms/ support sources/ tx options and side effects in detail today  Long disc again re: decreasing her obligations and getting help at home  Will inc her lexapro to 20 mg daily  Discussed expectations of SSRI medication including time to effectiveness and mechanism of action, also poss of side effects (early and late)- including mental fuzziness, weight or appetite change, nausea and poss of worse dep or anxiety (even suicidal thoughts)  Pt voiced understanding and will stop med and update if this occurs  F/u in 6 wk

## 2015-07-24 NOTE — Assessment & Plan Note (Signed)
B12 shot today- and will schedule a shot every 6 weeks (pt has f/u in the office then)_ inst pt to start taking 1000 mcg of B12 otc daily

## 2015-07-24 NOTE — Assessment & Plan Note (Signed)
Pt is doing well and now declines further screening at her age

## 2015-07-24 NOTE — Assessment & Plan Note (Signed)
bp in fair control at this time  BP Readings from Last 1 Encounters:  07/22/15 138/70   No changes needed Disc lifstyle change with low sodium diet and exercise  Labs reviewed

## 2015-07-24 NOTE — Assessment & Plan Note (Signed)
D is low today in pt with hx of OP  Will double her daily dose to 4000 iu daily

## 2015-07-24 NOTE — Assessment & Plan Note (Signed)
Rev fall and fx hx  She declines further eval/dexa or tx at this time Doubled her vit D intake /level is low again  Disc need for calcium/ vitamin D/ wt bearing exercise and bone density test every 2 y to monitor Disc safety/ fracture risk in detail

## 2015-07-24 NOTE — Assessment & Plan Note (Signed)
Resolved on labs today  slt anemia - likely due to chronic disease

## 2015-07-24 NOTE — Assessment & Plan Note (Signed)
Reviewed health habits including diet and exercise and skin cancer prevention Reviewed appropriate screening tests for age  Also reviewed health mt list, fam hx and immunization status , as well as social and family history   See HPI Reviewed [facility] visit  Declines much screening at this time or further eval of hearing due to age Labs reviewed You need more water Increase your lexapro to 20 mg daily for anxiety (you are taking 10 - so take 2 pills once daily and then the new px will be for 20 mg) Also rest Have Felicia start prepping meals for you and also help with medication dosing and reminders  Eat 3- 4 small meals per day = more protein and leafy greens See GI  Dr Collene Mares- when you are ready to discuss constipation  Make your eye doctor appt with Dr Dingeldein  Increase your vitamin D3 over the counter to 4000 iu every day  And B12 shot today  Schedule B12 shots every 6 weeks

## 2015-07-24 NOTE — Assessment & Plan Note (Signed)
Pt is aware and declines futher eval or tx at this time

## 2015-08-05 ENCOUNTER — Encounter: Payer: Self-pay | Admitting: Family Medicine

## 2015-08-05 ENCOUNTER — Ambulatory Visit (INDEPENDENT_AMBULATORY_CARE_PROVIDER_SITE_OTHER): Payer: Medicare Other | Admitting: Family Medicine

## 2015-08-05 VITALS — BP 160/90 | HR 86 | Temp 98.7°F | Wt 145.8 lb

## 2015-08-05 DIAGNOSIS — L200 Besnier's prurigo: Secondary | ICD-10-CM | POA: Diagnosis not present

## 2015-08-05 DIAGNOSIS — L239 Allergic contact dermatitis, unspecified cause: Secondary | ICD-10-CM | POA: Insufficient documentation

## 2015-08-05 MED ORDER — TRIAMCINOLONE ACETONIDE 0.1 % EX CREA: 1.0000 "application " | TOPICAL_CREAM | Freq: Two times a day (BID) | CUTANEOUS | Status: DC

## 2015-08-05 NOTE — Progress Notes (Signed)
Subjective:   Patient ID: Dawn Aguirre, female    DOB: [birth year], 82 y.o.   MRN: [id number]  Dawn Aguirre is a pleasant 82 y.o. year old female pt of Dr. Glori Bickers, new to me, who presents to clinic today with Rash  on 08/05/2015  HPI:  Very itchy areas on her feet bilaterally.  Started yesterday after she was rubbing her dog's back with her feet.  Eyes are also watery and itchy.  Has applied calamine lotion to the areas without much relief.  Current Outpatient Prescriptions on File Prior to Visit  Medication Sig Dispense Refill  . Cholecalciferol (VITAMIN D) 2000 units tablet Take 2,000 Units by mouth daily.    Marland Kitchen escitalopram (LEXAPRO) 20 MG tablet Take 1 tablet (20 mg total) by mouth daily. 30 tablet 11  . levothyroxine (SYNTHROID, LEVOTHROID) 125 MCG tablet Take 1 tablet (125 mcg total) by mouth daily. 30 tablet 11  . Polyethylene Glycol 3350 (MIRALAX PO) Take by mouth.    . Probiotic Product (ALIGN PO) Take 1 tablet by mouth daily.    . traMADol (ULTRAM) 50 MG tablet TAKE 1 TO 2 TABLETS BY MOUTH UP TO 3 TIMES A DAY AS NEEDED FOR PAIN 90 tablet 0   No current facility-administered medications on file prior to visit.    Allergies  Allergen Reactions  . Ace Inhibitors     REACTION: cough  . Ezetimibe     REACTION: myalgia  . Ezetimibe-Simvastatin     REACTION: joints swell  . Macrobid [Nitrofurantoin]     ABD PAIN, ALSO BODY ACHES/PAIN  . Moxifloxacin   . Rosuvastatin     REACTION: myalgia  . Septra [Sulfamethoxazole-Trimethoprim]     Sick feeling   . Simvastatin     REACTION: myalgia  . Triaminic     Past Medical History  Diagnosis Date  . Flatulence, eructation, and gas pain   . Chronic airway obstruction, not elsewhere classified   . Cough   . Degeneration of cervical intervertebral disc   . Diverticulosis of colon (without mention of hemorrhage)   . Other dyspnea and respiratory abnormality   . External hemorrhoids without mention of complication   .  Diffuse cystic mastopathy   . Unspecified gastritis and gastroduodenitis without mention of hemorrhage   . Esophageal reflux   . Other and unspecified hyperlipidemia   . Unspecified essential hypertension   . Unspecified hypothyroidism   . Acquired absence of organ, genital organs   . Osteoporosis, unspecified   . Other acute sinusitis   . Raynaud's syndrome   . Rheumatoid arthritis(714.0)     Sero-negative  . Predominant disturbance of emotions   . Personal history of tobacco use, presenting hazards to health   . Lichen sclerosus     back/abd/vulvar  . Spinal stenosis   . Colitis, ischemic ([place]) 07/2000    ? infect  . Mastoiditis of right side 04/2005    admitted  . Fibula fracture 2006  . Hyperlipidemia   . Wears dentures     top  . Breast cancer ([place])     surgery at [facility]    Past Surgical History  Procedure Laterality Date  . Abdominal hysterectomy  1968  . Dexa  2001    OP,heel  . Cardiollite  02/2002    Neg  . Nasal sinus surgery  05/2002  . Dexa      OP hip T-2.94  . US echocardiography  10/06    normal LVF EF 55-65%  .  Nuclear stress test  11/07    Normal  . Tonsillectomy    . Colonoscopy      Family History  Problem Relation Age of Onset  . Heart attack Father 86  . Coronary artery disease Sister 5    CABG  . Heart attack Brother 20  . Coronary artery disease      GF    Social History   Social History  . Marital Status: Married    Spouse Name: N/A  . Number of Children: 3  . Years of Education: N/A   Occupational History  . Retired    Social History Main Topics  . Smoking status: Former Smoker    Quit date: 02/07/1962  . Smokeless tobacco: Not on file  . Alcohol Use: 0.0 oz/week    0 Standard drinks or equivalent per week     Comment: occ wine  . Drug Use: No  . Sexual Activity: No   Other Topics Concern  . Not on file   Social History Narrative   Retired      [employer] of [employer], cares for disabled son   The  PMH, [place], Social History, Family History, Medications, and allergies have been reviewed in [facility], and have been updated if relevant.   Review of Systems  HENT: Positive for rhinorrhea.   Eyes: Positive for itching.  Musculoskeletal: Negative.   Skin: Positive for rash.  All other systems reviewed and are negative.      Objective:    BP 160/90 mmHg  Pulse 86  Temp(Src) 98.7 F (37.1 C) (Oral)  Wt 145 lb 12 oz (66.112 kg)  SpO2 98%   Physical Exam  Constitutional: She is oriented to person, place, and time. She appears well-developed and well-nourished. No distress.  HENT:  Head: Normocephalic.  Eyes: Conjunctivae are normal.  Cardiovascular: Normal rate.   Pulmonary/Chest: Effort normal.  Musculoskeletal: Normal range of motion.  Neurological: She is alert and oriented to person, place, and time. No cranial nerve deficit.  Skin: Skin is warm. Rash noted. Rash is urticarial. She is not diaphoretic.     Psychiatric: She has a normal mood and affect. Her behavior is normal. Judgment and thought content normal.  Nursing note and vitals reviewed.         Assessment & Plan:   Allergic dermatitis No Follow-up on file.

## 2015-08-05 NOTE — Patient Instructions (Signed)
Great to see you.  Please take zyrtec OTC daily for next several days or until symptoms resolve.  Triamcinolone to the areas twice daily.  Please keep [place] updated.

## 2015-08-05 NOTE — Assessment & Plan Note (Signed)
New- advised OTC zyrtec, eRx sent for topical triamcinolone D/c calamine lotion Advised to not use for more than a week without updating [place] Also wash dog, bedsheets, towels, slippers, etc. Call or return to clinic prn if these symptoms worsen or fail to improve as anticipated. The patient indicates understanding of these issues and agrees with the plan.

## 2015-08-18 IMAGING — CT CT ABD-PELV W/ CM
2 of 5 series · 16 of 46 positions shown, 18 images · IV contrast (omnipaque)
Comparison: 09/10/2004.

CLINICAL DATA: Left lower quadrant abdominal pain. Prior history of
diverticulitis.

EXAM:
CT ABDOMEN AND PELVIS WITH CONTRAST
TECHNIQUE: Multidetector CT imaging of the abdomen and pelvis was performed
using the standard protocol following bolus administration of
intravenous contrast.
CONTRAST:  100mL OMNIPAQUE IOHEXOL 300 MG/ML IV. Oral contrast was
also administered.

[Series 2: abd/ pelvis 5.0 i30f 1 · axial · 0.75mm/px · z∈[+687,+1027]mm · 13 of 77 slices shown, 15 images]
[im 5/77  soft-tissue]
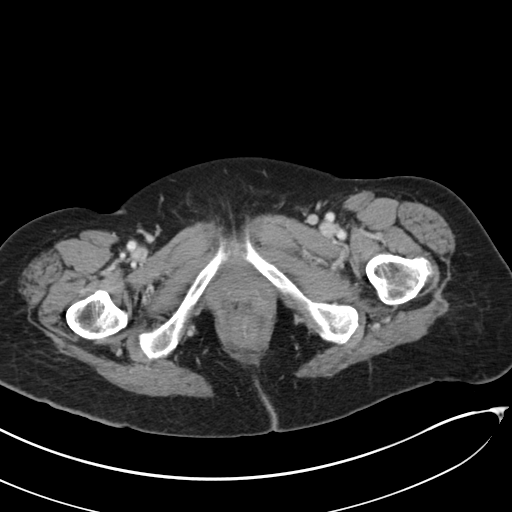
[im 5/77  bone]
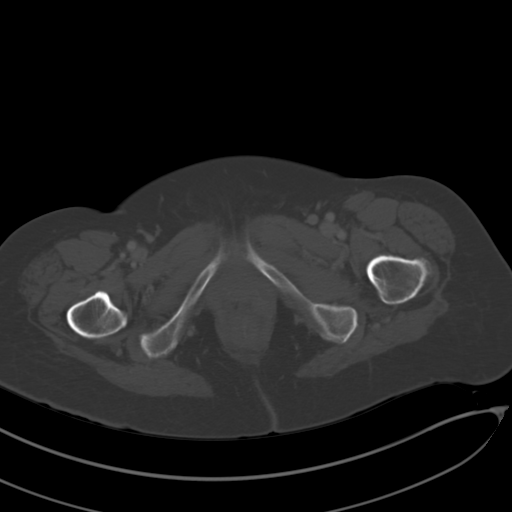
[im 13/77  soft-tissue]
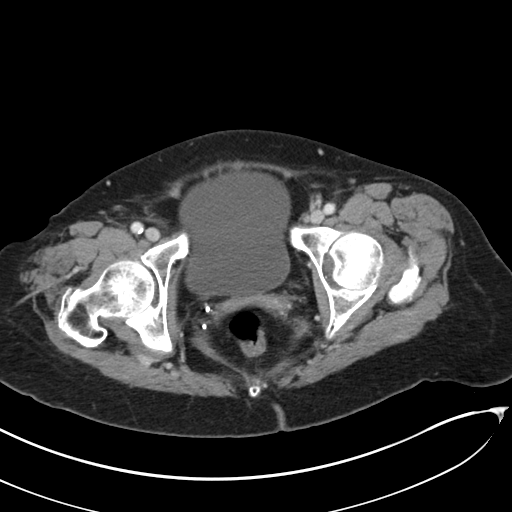
[im 17/77  soft-tissue]
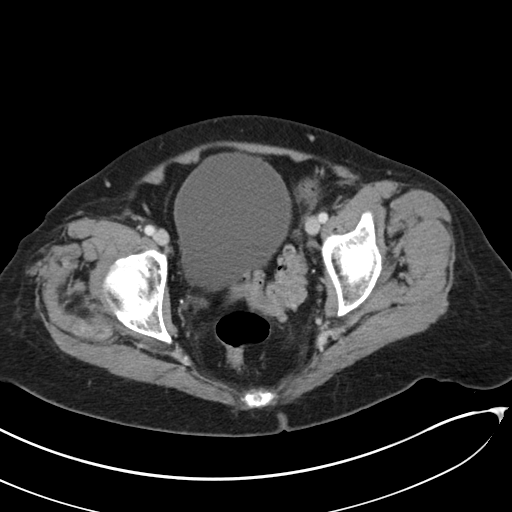
[im 21/77  soft-tissue]
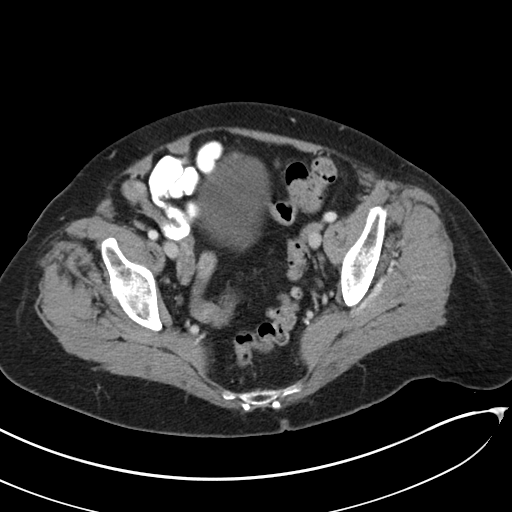
[im 29/77  soft-tissue]
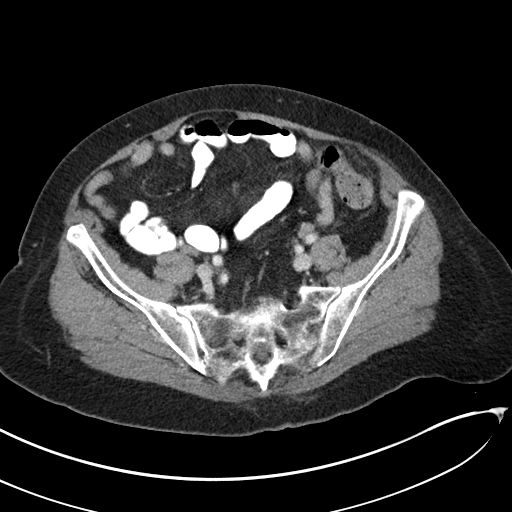
[im 33/77  soft-tissue]
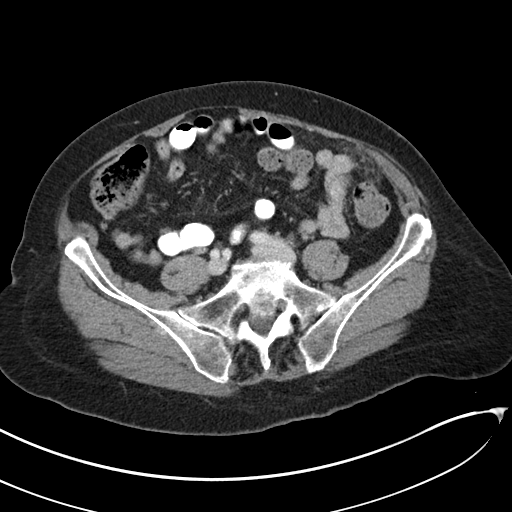
[im 41/77  soft-tissue]
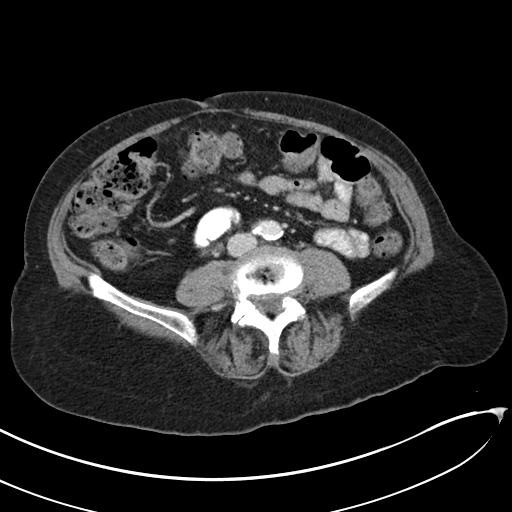
[im 45/77  soft-tissue]
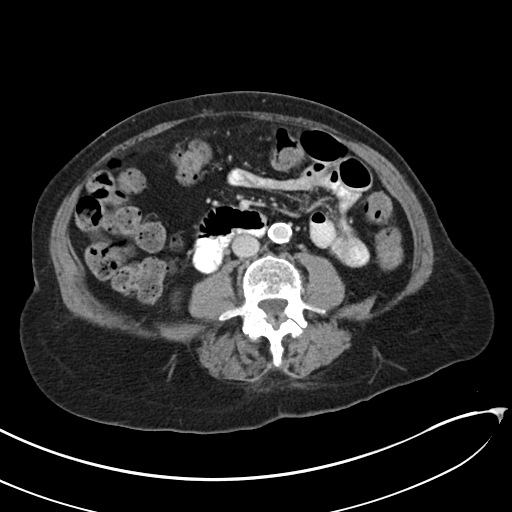
[im 49/77  soft-tissue]
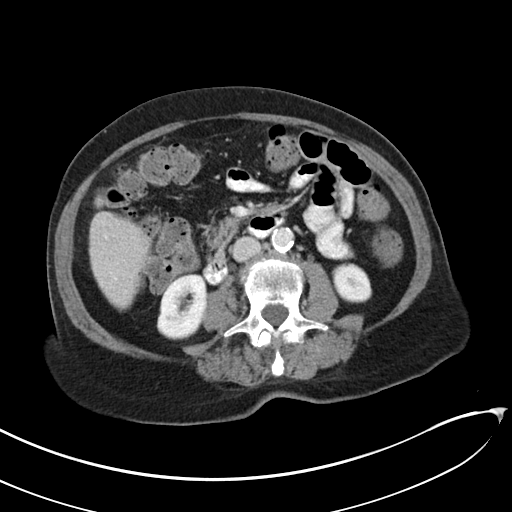
[im 49/77  bone]
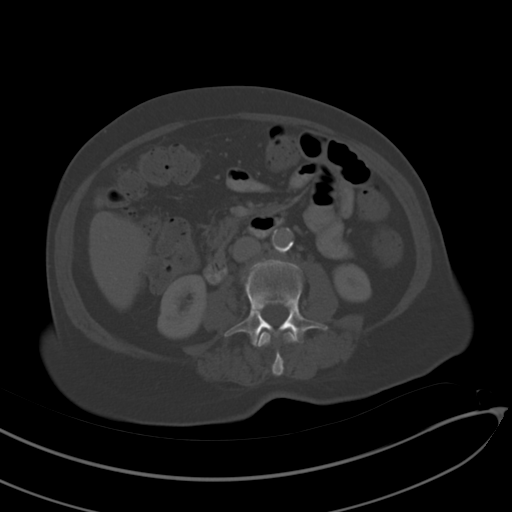
[im 57/77  soft-tissue]
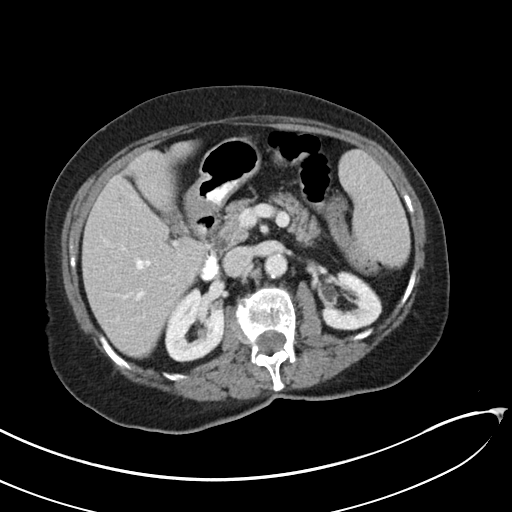
[im 61/77  soft-tissue]
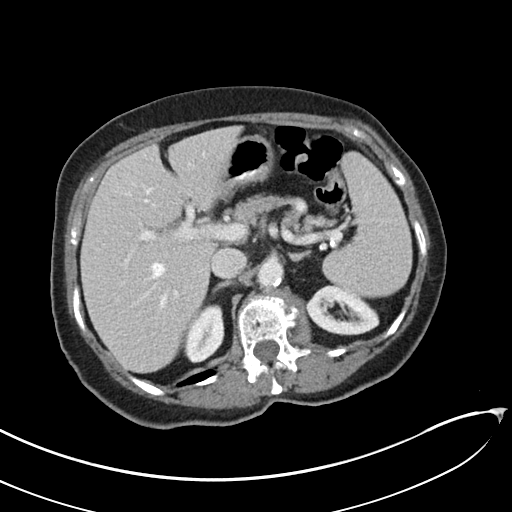
[im 65/77  soft-tissue]
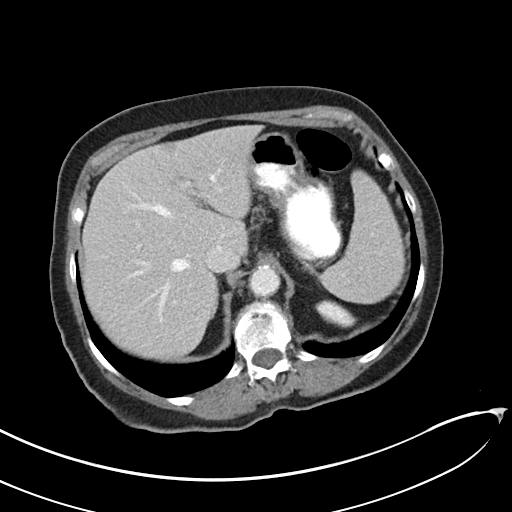
[im 73/77  soft-tissue]
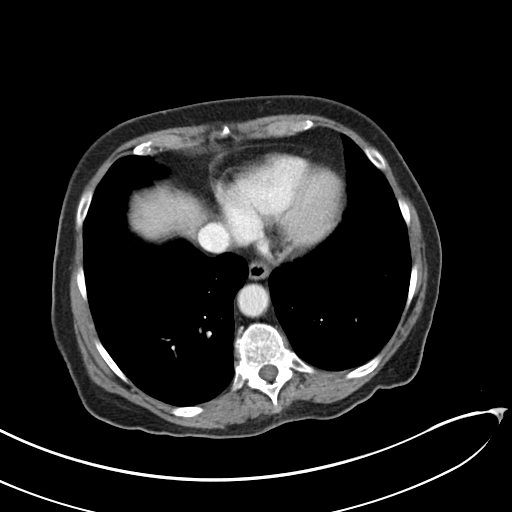

[Series 5: coronal soft tissue · coronal · 0.70mm/px · 3 of 75 slices shown]
[im 25/75  soft-tissue]
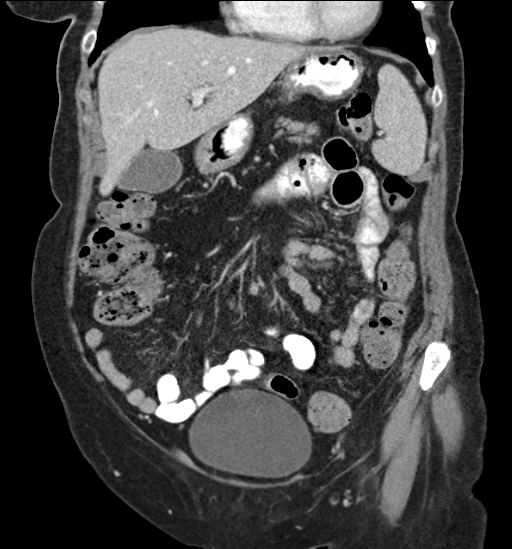
[im 33/75  soft-tissue]
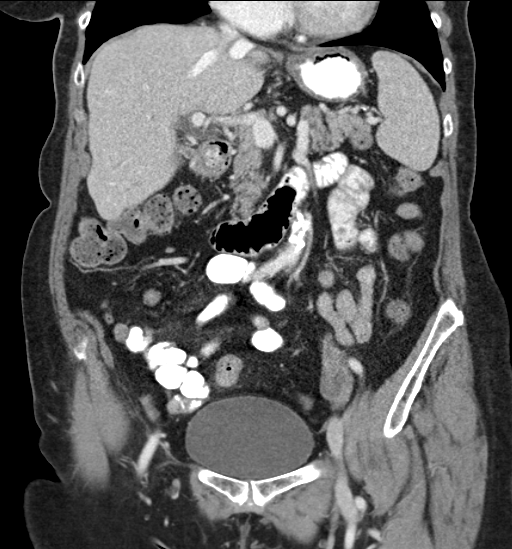
[im 42/75  soft-tissue]
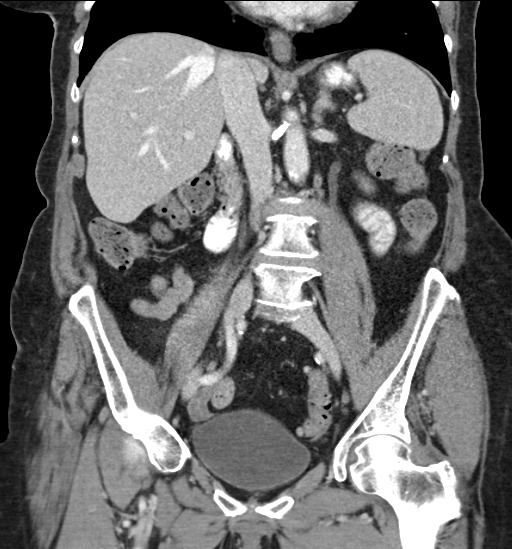

[16 of 46 positions shown; findings below may reference images not displayed]

FINDINGS: Edema/inflammation surrounding the distal descending colon in an
area of where there is no diverticular disease; on the coronal
reformatted images (series 5, image 21), this appears to involve an
epiploic appendage. No extraluminal gas. No abnormal fluid
collection. No ascites. Large stool burden throughout the colon from
cecum to rectum. Diverticulosis involving the sigmoid colon without
evidence of acute inflammation surrounding the sigmoid colon. Normal
appearing small bowel. Stomach decompressed and unremarkable.

Normal appearing liver, spleen, pancreas, adrenal glands, and
gallbladder. Subcentimeter cortical cysts involving both kidneys; no
significant abnormality involving either kidney. No biliary ductal
dilation. Extensive aortoiliofemoral atherosclerosis without
aneurysm. No significant lymphadenopathy.

Uterus surgically absent. Normal appearing ovaries. No free pelvic
fluid. Numerous pelvic phleboliths. Urinary bladder unremarkable.

Bone window images demonstrate degenerative changes involving the
thoracic and lumbar spine, degenerative disc disease diffusely
throughout the lumbar spine, an generalized osseous
demineralization. Minimal scarring involving the visualized lower
lobes. Heart size upper normal.
IMPRESSION: 1. Epiploic appendagitis involving the distal descending colon.
There is no evidence of diverticulosis in the area of inflammation.
2. Sigmoid colon diverticulosis without evidence of acute
diverticulitis in this region.
3. Extensive aortoiliofemoral atherosclerosis without aneurysm.

## 2015-08-26 ENCOUNTER — Encounter: Payer: Self-pay | Admitting: Family Medicine

## 2015-08-26 ENCOUNTER — Ambulatory Visit (INDEPENDENT_AMBULATORY_CARE_PROVIDER_SITE_OTHER)
Admission: RE | Admit: 2015-08-26 | Discharge: 2015-08-26 | Disposition: A | Discharge status: Home / Self Care | Payer: Medicare Other | Source: Ambulatory Visit | Attending: Family Medicine | Admitting: Family Medicine

## 2015-08-26 ENCOUNTER — Ambulatory Visit (INDEPENDENT_AMBULATORY_CARE_PROVIDER_SITE_OTHER): Payer: Medicare Other | Admitting: Family Medicine

## 2015-08-26 VITALS — BP 156/70 | HR 81 | Temp 98.6°F | Ht 61.5 in | Wt 142.8 lb

## 2015-08-26 DIAGNOSIS — J069 Acute upper respiratory infection, unspecified: Secondary | ICD-10-CM

## 2015-08-26 DIAGNOSIS — K5901 Slow transit constipation: Secondary | ICD-10-CM

## 2015-08-26 DIAGNOSIS — I10 Essential (primary) hypertension: Secondary | ICD-10-CM

## 2015-08-26 NOTE — Assessment & Plan Note (Signed)
With low grade temp last week (that is better) Now cough and nasal symptoms  In light of age and malaise / high risk for pneumonia -cxr today  Continue zyrtec Rest/fluids mucinex otc prn

## 2015-08-26 NOTE — Progress Notes (Signed)
Pre visit review using our clinic review tool, if applicable. No additional management support is needed unless otherwise documented below in the visit note. 

## 2015-08-26 NOTE — Assessment & Plan Note (Signed)
bp is up today- pt states it is because she is anxious and not feeling well  Will follow closely

## 2015-08-26 NOTE — Patient Instructions (Addendum)
Stop at check out for referral to GI Drink fluids - update me if you continue to have vomiting  I think you have an upper respiratory illness but let's get a chest xray to make sure you do not have pneumonia  mucinex over the counter is ok for cough and congestion if needed  Zyrtec 10 mg at night for runny nose and post nasal drip   Update if not starting to improve in 1-2 weeks or if worsening

## 2015-08-26 NOTE — Progress Notes (Signed)
Subjective:    Patient ID: Dawn Aguirre, female    DOB: March 18, 1925, 82 y.o.   MRN: [id number]  HPI Here with uri symptoms for about a week  No known sick exposures  low grade temp until Sun- 99-100.0  Very shaky (not unusual) Balance is off worse than usual  This am feeling a bit better but coughing now (no phlegm)  Some n/v this am -just water  Gurgling in stomach  No diarrhea  Some constipation - but that improved today  Just bloating/ not pain   Has not been back to GI yet - plans on it   Some ST and runny and stuffy nose and general mild headache  Sinuses feel full  All mucous is clear   Has not felt great since her rash (? Poison ivy) -in June  No tick bite she knew of  Rash is better   Patient Active Problem List   Diagnosis Date Noted  . URI, acute 08/26/2015  . Allergic dermatitis 08/05/2015  . Routine general medical examination at a health care facility 07/22/2015  . Hearing loss 07/22/2015  . Hyperglycemia 07/22/2015  . Anxiety disorder 04/24/2015  . Abdominal bloating 03/06/2015  . Tremor 12/20/2014  . Chronic pain 10/14/2014  . B12 deficiency 07/16/2014  . Vitamin D deficiency 07/16/2014  . Fatigue 07/04/2014  . Encounter for Medicare annual wellness exam 01/13/2014  . Fall in home 12/24/2013  . History of breast cancer 12/18/2013  . Epiploic appendagitis 02/22/2013  . Dry mouth 02/04/2013  . Chest wall pain 12/25/2012  . Benign paroxysmal positional vertigo 09/14/2012  . Low back pain 09/07/2012  . Constipation 11/04/2011  . Aortic stenosis 08/31/2010  . Hyperlipidemia   . GASTRITIS 04/01/2009  . STRESS REACTION, ACUTE, WITH EMOTIONAL DISTURBANCE 10/12/2007  . [place] DISEASE, CERVICAL 10/12/2007  . Hypothyroidism 01/22/2007  . RAYNAUD'S SYNDROME 01/22/2007  . EXTERNAL HEMORRHOIDS 01/22/2007  . COPD 01/22/2007  . DIVERTICULOSIS, COLON 01/22/2007  . FIBROCYSTIC BREAST DISEASE 01/22/2007  . Osteoporosis 01/22/2007  . Essential  hypertension 12/16/2006  . GERD 12/16/2006  . RHEUMATOID ARTHRITIS 12/16/2006  . DYSPNEA ON EXERTION 12/16/2006  . COUGH, CHRONIC 12/16/2006  . TOBACCO ABUSE, HX OF 12/16/2006  . HYSTERECTOMY, HX OF 12/16/2006   Past Medical History  Diagnosis Date  . Flatulence, eructation, and gas pain   . Chronic airway obstruction, not elsewhere classified   . Cough   . Degeneration of cervical intervertebral disc   . Diverticulosis of colon (without mention of hemorrhage)   . Other dyspnea and respiratory abnormality   . External hemorrhoids without mention of complication   . Diffuse cystic mastopathy   . Unspecified gastritis and gastroduodenitis without mention of hemorrhage   . Esophageal reflux   . Other and unspecified hyperlipidemia   . Unspecified essential hypertension   . Unspecified hypothyroidism   . Acquired absence of organ, genital organs   . Osteoporosis, unspecified   . Other acute sinusitis   . Raynaud's syndrome   . Rheumatoid arthritis(714.0)     Sero-negative  . Predominant disturbance of emotions   . Personal history of tobacco use, presenting hazards to health   . Lichen sclerosus     back/abd/vulvar  . Spinal stenosis   . Colitis, ischemic ([place]) 07/2000    ? infect  . Mastoiditis of right side 04/2005    admitted  . Fibula fracture 2006  . Hyperlipidemia   . Wears dentures     top  . Breast cancer (  [place])     surgery [facility]   Past Surgical History  Procedure Laterality Date  . Abdominal hysterectomy  1968  . Dexa  2001    OP,heel  . Cardiollite  02/2002    Neg  . Nasal sinus surgery  05/2002  . Dexa      OP hip T-2.94  . US echocardiography  10/06    normal LVF EF 55-65%  . Nuclear stress test  11/07    Normal  . Tonsillectomy    . Colonoscopy     Social History  Substance Use Topics  . Smoking status: Former Smoker    Quit date: 02/07/1962  . Smokeless tobacco: None  . Alcohol Use: 0.0 oz/week    0 Standard drinks or equivalent per week      Comment: occ wine   Family History  Problem Relation Age of Onset  . Heart attack Father 32  . Coronary artery disease Sister 83    CABG  . Heart attack Brother 43  . Coronary artery disease      GF   Allergies  Allergen Reactions  . Ace Inhibitors     REACTION: cough  . Ezetimibe     REACTION: myalgia  . Ezetimibe-Simvastatin     REACTION: joints swell  . Macrobid [Nitrofurantoin]     ABD PAIN, ALSO BODY ACHES/PAIN  . Moxifloxacin   . Rosuvastatin     REACTION: myalgia  . Septra [Sulfamethoxazole-Trimethoprim]     Sick feeling   . Simvastatin     REACTION: myalgia  . Triaminic    Current Outpatient Prescriptions on File Prior to Visit  Medication Sig Dispense Refill  . Cholecalciferol (VITAMIN D) 2000 units tablet Take 2,000 Units by mouth daily.    Marland Kitchen escitalopram (LEXAPRO) 20 MG tablet Take 1 tablet (20 mg total) by mouth daily. 30 tablet 11  . levothyroxine (SYNTHROID, LEVOTHROID) 125 MCG tablet Take 1 tablet (125 mcg total) by mouth daily. 30 tablet 11  . Polyethylene Glycol 3350 (MIRALAX PO) Take by mouth.    . Probiotic Product (ALIGN PO) Take 1 tablet by mouth daily.    . traMADol (ULTRAM) 50 MG tablet TAKE 1 TO 2 TABLETS BY MOUTH UP TO 3 TIMES A DAY AS NEEDED FOR PAIN 90 tablet 0  . triamcinolone cream (KENALOG) 0.1 % Apply 1 application topically 2 (two) times daily. 30 g 0   No current facility-administered medications on file prior to visit.    Review of Systems    Review of Systems  Constitutional: Negative for fever, appetite change, and unexpected weight change. pos for fatigue  Eyes: Negative for pain and visual disturbance.  Respiratory: Negative for wheeze and shortness of breath.   Cardiovascular: Negative for cp or palpitations    Gastrointestinal: Negative for nausea, diarrhea and pos for  constipation. neg for further episodes of emesis, neg for black stool or blood in stool Genitourinary: Negative for urgency and frequency. pos for cystocele    Skin: Negative for pallor or rash   Neurological: Negative for weakness, light-headedness, numbness and headaches. pos for tremor and poor balance  Hematological: Negative for adenopathy. Does not bruise/bleed easily.  Psychiatric/Behavioral: Negative for dysphoric mood. The patient is anxious (states "nerves are shot")    Objective:   Physical Exam  Constitutional: She appears well-developed and well-nourished. No distress.  Frail appearing elderly female  HENT:  Head: Normocephalic and atraumatic.  Right Ear: External ear normal.  Left Ear: External ear normal.  Mouth/Throat: Oropharynx is clear and moist.  Nares are injected and congested  No sinus tenderness Clear rhinorrhea and post nasal drip   Eyes: Conjunctivae and EOM are normal. Pupils are equal, round, and reactive to light. Right eye exhibits no discharge. Left eye exhibits no discharge. No scleral icterus.  Neck: Normal range of motion. Neck supple. No JVD present. Carotid bruit is not present. No thyromegaly present.  Cardiovascular: Normal rate, regular rhythm and intact distal pulses.  Exam reveals no gallop.   Murmur heard. Pulmonary/Chest: Effort normal and breath sounds normal. No respiratory distress. She has no wheezes. She has no rales. She exhibits no tenderness.  No crackles  Abdominal: Soft. Bowel sounds are normal. She exhibits no distension, no abdominal bruit and no mass. There is no tenderness. There is no rebound and no guarding.  Musculoskeletal: She exhibits no edema.  Limited rom of L shoulder which she states has been giving her trouble  Lymphadenopathy:    She has no cervical adenopathy.  Neurological: She is alert. She has normal reflexes. No cranial nerve deficit. She exhibits normal muscle tone. Coordination normal.  Mild head tremor noted  Generally poor balance   Skin: Skin is warm and dry. No rash noted. No pallor.  Psychiatric: Her behavior is normal. Thought content normal. Her mood  appears anxious. Her affect is not blunt, not labile and not inappropriate. She does not exhibit a depressed mood.  Pt is mildly anxious   Supportive husband is present       Assessment & Plan:   Problem List Items Addressed This Visit      Cardiovascular and Mediastinum   Essential hypertension    bp is up today- pt states it is because she is anxious and not feeling well  Will follow closely         Respiratory   URI, acute - Primary    With low grade temp last week (that is better) Now cough and nasal symptoms  In light of age and malaise / high risk for pneumonia -cxr today  Continue zyrtec Rest/fluids mucinex otc prn      Relevant Orders   DG Chest 2 View     Digestive   Constipation    Ongoing -pt feels like "there is an obstruction" One large bm this am - with one episode of vomiting fluid also  Improved now  Uses miralax This problem seems to be worsening with time- recommended again she see GI Ref to GI for this/ abd bloating and malaise  Nl bs on exam- do not suspect bowel obst       Relevant Orders   Ambulatory referral to Gastroenterology

## 2015-08-26 NOTE — Assessment & Plan Note (Signed)
Ongoing -pt feels like "there is an obstruction" One large bm this am - with one episode of vomiting fluid also  Improved now  Uses miralax This problem seems to be worsening with time- recommended again she see GI Ref to GI for this/ abd bloating and malaise  Nl bs on exam- do not suspect bowel obst

## 2015-09-02 ENCOUNTER — Encounter: Payer: Self-pay | Admitting: Family Medicine

## 2015-09-02 ENCOUNTER — Ambulatory Visit (INDEPENDENT_AMBULATORY_CARE_PROVIDER_SITE_OTHER): Payer: Medicare Other | Admitting: Family Medicine

## 2015-09-02 ENCOUNTER — Ambulatory Visit (INDEPENDENT_AMBULATORY_CARE_PROVIDER_SITE_OTHER)
Admission: RE | Admit: 2015-09-02 | Discharge: 2015-09-02 | Disposition: A | Discharge status: Home / Self Care | Payer: Medicare Other | Source: Ambulatory Visit | Attending: Family Medicine | Admitting: Family Medicine

## 2015-09-02 VITALS — BP 148/78 | HR 112 | Temp 98.5°F | Ht 61.5 in | Wt 143.0 lb

## 2015-09-02 DIAGNOSIS — M542 Cervicalgia: Secondary | ICD-10-CM

## 2015-09-02 DIAGNOSIS — I10 Essential (primary) hypertension: Secondary | ICD-10-CM

## 2015-09-02 DIAGNOSIS — M79671 Pain in right foot: Secondary | ICD-10-CM

## 2015-09-02 DIAGNOSIS — E538 Deficiency of other specified B group vitamins: Secondary | ICD-10-CM | POA: Diagnosis not present

## 2015-09-02 DIAGNOSIS — R5382 Chronic fatigue, unspecified: Secondary | ICD-10-CM

## 2015-09-02 DIAGNOSIS — E039 Hypothyroidism, unspecified: Secondary | ICD-10-CM | POA: Diagnosis not present

## 2015-09-02 DIAGNOSIS — M25512 Pain in left shoulder: Secondary | ICD-10-CM

## 2015-09-02 DIAGNOSIS — R251 Tremor, unspecified: Secondary | ICD-10-CM

## 2015-09-02 DIAGNOSIS — F418 Other specified anxiety disorders: Secondary | ICD-10-CM

## 2015-09-02 LAB — TSH: TSH: 0.1 u[IU]/mL — ABNORMAL LOW (ref 0.35–4.50)

## 2015-09-02 LAB — VITAMIN B12: Vitamin B-12: 350 pg/mL (ref 211–911)

## 2015-09-02 MED ORDER — CYANOCOBALAMIN 1000 MCG/ML IJ SOLN
1000.0000 ug | Freq: Once | INTRAMUSCULAR | Status: AC
  Administered 2015-09-02: 1000 ug via INTRAMUSCULAR

## 2015-09-02 MED ORDER — TRAMADOL HCL 50 MG PO TABS: ORAL_TABLET | ORAL | 0 refills | Status: DC

## 2015-09-02 NOTE — Assessment & Plan Note (Signed)
Due for a check-now with much more compliant dosing of levothyroxine Pt continues to c/o shaking and malaise/fatigue

## 2015-09-02 NOTE — Progress Notes (Signed)
Subjective:    Patient ID: Dawn Aguirre, female    DOB: Feb 06, 1926, 82 y.o.   MRN: [id number]  HPI Here for f/u of acute and chronic medical problems  Worse shoulder pain lately (L)-keeping her from sleeping   Last seen for uri  Had a chest xray that was clear  Cough is improved  Some runny nose- thinks allergies  Throat is a little scratchy   Still does not feel well in general   PACS Images   Show images for DG Chest 2 View  Study Result   CLINICAL DATA:  82 year old female with a history of cough and low-grade fever  EXAM: CHEST  2 VIEW  COMPARISON:  Chest x-ray 12/24/2013, CT 02/13/2014  FINDINGS: Cardiomediastinal silhouette unchanged in size and contour.  Calcifications of the aortic arch.  Stigmata of emphysema, with increased retrosternal airspace, flattened hemidiaphragms, increased AP diameter, and hyperinflation on the AP view.  No confluent airspace disease. No pneumothorax. No pleural effusion.  Degenerative changes of the spine.  No displaced fracture.  Surgical clips of the left breast.  IMPRESSION: Emphysema without evidence of superimposed acute cardiopulmonary disease.  Aortic atherosclerosis  Signed,  Dulcy Fanny. Earleen Newport, DO   Was ref to GI for ongoing GI issues  Has appt with Dr Collene Mares on 8/1 Back and forth between constipation and diarrhea  Now eating a salad every day for lunch   Almost fainted on friday Was standing in the kitchen  Felt dizzy in her neck and head  Tipped over into the counter - and Felicia caught her (no LOC) Lowered her to her knees and then helped her to bed  Then went to sleep for a long time- overnight  Sleeping a lot over all  Still feels very shaky and weak when she gets up  A good night of sleep is not seeming to help    Still has a tremor  Head just does not feel right also  occ the right side of her head bothers her   Also shoulder and neck pain (wears a neck brace to sleep at  night)  Wt Readings from Last 3 Encounters:  09/02/15 143 lb (64.9 kg)  08/26/15 142 lb 12 oz (64.8 kg)  08/05/15 145 lb 12 oz (66.1 kg)   BP Readings from Last 3 Encounters:  09/02/15 (!) 148/78  08/26/15 (!) 156/70  08/05/15 (!) 160/90    Due for B12 shot today  Lab Results  Component Value Date   VITAMINB12 291 07/21/2015   Hx of hypothyroidism Lab Results  Component Value Date   TSH 15.56 (H) 07/21/2015   she was not taking medicine at the time correctly Has not missed a dose since then   Patient Active Problem List   Diagnosis Date Noted  . Left shoulder pain 09/02/2015  . Neck pain 09/02/2015  . Allergic dermatitis 08/05/2015  . Routine general medical examination at a health care facility 07/22/2015  . Hearing loss 07/22/2015  . Hyperglycemia 07/22/2015  . Anxiety disorder 04/24/2015  . Abdominal bloating 03/06/2015  . Tremor 12/20/2014  . Chronic pain 10/14/2014  . B12 deficiency 07/16/2014  . Vitamin D deficiency 07/16/2014  . Fatigue 07/04/2014  . Encounter for Medicare annual wellness exam 01/13/2014  . Fall in home 12/24/2013  . History of breast cancer 12/18/2013  . Epiploic appendagitis 02/22/2013  . Dry mouth 02/04/2013  . Chest wall pain 12/25/2012  . Benign paroxysmal positional vertigo 09/14/2012  . Low back pain 09/07/2012  .  Constipation 11/04/2011  . Aortic stenosis 08/31/2010  . Hyperlipidemia   . GASTRITIS 04/01/2009  . STRESS REACTION, ACUTE, WITH EMOTIONAL DISTURBANCE 10/12/2007  . [place] DISEASE, CERVICAL 10/12/2007  . Hypothyroidism 01/22/2007  . RAYNAUD'S SYNDROME 01/22/2007  . EXTERNAL HEMORRHOIDS 01/22/2007  . COPD 01/22/2007  . DIVERTICULOSIS, COLON 01/22/2007  . FIBROCYSTIC BREAST DISEASE 01/22/2007  . Osteoporosis 01/22/2007  . Essential hypertension 12/16/2006  . GERD 12/16/2006  . RHEUMATOID ARTHRITIS 12/16/2006  . DYSPNEA ON EXERTION 12/16/2006  . COUGH, CHRONIC 12/16/2006  . TOBACCO ABUSE, HX OF 12/16/2006  .  HYSTERECTOMY, HX OF 12/16/2006   Past Medical History:  Diagnosis Date  . Acquired absence of organ, genital organs   . Breast cancer ([place])    surgery at [facility]  . Chronic airway obstruction, not elsewhere classified   . Colitis, ischemic ([place]) 07/2000   ? infect  . Cough   . Degeneration of cervical intervertebral disc   . Diffuse cystic mastopathy   . Diverticulosis of colon (without mention of hemorrhage)   . Esophageal reflux   . External hemorrhoids without mention of complication   . Fibula fracture 2006  . Flatulence, eructation, and gas pain   . Hyperlipidemia   . Lichen sclerosus    back/abd/vulvar  . Mastoiditis of right side 04/2005   admitted  . Osteoporosis, unspecified   . Other acute sinusitis   . Other and unspecified hyperlipidemia   . Other dyspnea and respiratory abnormality   . Personal history of tobacco use, presenting hazards to health   . Predominant disturbance of emotions   . Raynaud's syndrome   . Rheumatoid arthritis(714.0)    Sero-negative  . Spinal stenosis   . Unspecified essential hypertension   . Unspecified gastritis and gastroduodenitis without mention of hemorrhage   . Unspecified hypothyroidism   . Wears dentures    top   Past Surgical History:  Procedure Laterality Date  . ABDOMINAL HYSTERECTOMY  1968  . Cardiollite  02/2002   Neg  . COLONOSCOPY    . DEXA  2001   OP,heel  . DEXA     OP hip T-2.94  . NASAL SINUS SURGERY  05/2002  . Nuclear Stress Test  11/07   Normal  . TONSILLECTOMY    . US ECHOCARDIOGRAPHY  10/06   normal LVF EF 55-65%   Social History  Substance Use Topics  . Smoking status: Former Smoker    Quit date: 02/07/1962  . Smokeless tobacco: Not on file  . Alcohol use 0.0 oz/week     Comment: occ wine   Family History  Problem Relation Age of Onset  . Heart attack Father 109  . Coronary artery disease Sister 43    CABG  . Heart attack Brother 27  . Coronary artery disease      GF   Allergies  Allergen  Reactions  . Ace Inhibitors     REACTION: cough  . Ezetimibe     REACTION: myalgia  . Ezetimibe-Simvastatin     REACTION: joints swell  . Macrobid [Nitrofurantoin]     ABD PAIN, ALSO BODY ACHES/PAIN  . Moxifloxacin   . Rosuvastatin     REACTION: myalgia  . Septra [Sulfamethoxazole-Trimethoprim]     Sick feeling   . Simvastatin     REACTION: myalgia  . Triaminic    Current Outpatient Prescriptions on File Prior to Visit  Medication Sig Dispense Refill  . Cholecalciferol (VITAMIN D) 2000 units tablet Take 2,000 Units by mouth daily.    Marland Kitchen  escitalopram (LEXAPRO) 20 MG tablet Take 1 tablet (20 mg total) by mouth daily. 30 tablet 11  . levothyroxine (SYNTHROID, LEVOTHROID) 125 MCG tablet Take 1 tablet (125 mcg total) by mouth daily. 30 tablet 11  . Polyethylene Glycol 3350 (MIRALAX PO) Take by mouth.    . Probiotic Product (ALIGN PO) Take 1 tablet by mouth daily.    Marland Kitchen triamcinolone cream (KENALOG) 0.1 % Apply 1 application topically 2 (two) times daily. 30 g 0   No current facility-administered medications on file prior to visit.     Review of Systems Review of Systems  Constitutional: Negative for fever, appetite change,  and unexpected weight change. pos for fatigue and malaise  Eyes: Negative for pain and visual disturbance.  Respiratory: Negative for cough and shortness of breath.   Cardiovascular: Negative for cp or palpitations    Gastrointestinal: Negative for nausea, diarrhea and constipation.  Genitourinary: Negative for urgency and frequency.  Skin: Negative for pallor or rash   Neurological: Negative for weakness, light-headedness, numbness and headaches. Pos for tremor (all over shakiness) and loss of balance  Hematological: Negative for adenopathy. Does not bruise/bleed easily.  Psychiatric/Behavioral: Negative for dysphoric mood. The patient is not nervous/anxious.         Objective:   Physical Exam  Constitutional: She appears well-developed and well-nourished.  No distress.  Frail appearing elderly female  HENT:  Head: Normocephalic and atraumatic.  Mouth/Throat: Oropharynx is clear and moist.  Eyes: Conjunctivae and EOM are normal. Pupils are equal, round, and reactive to light.  Neck: Normal range of motion. Neck supple. No JVD present. Carotid bruit is not present. No thyromegaly present.  Cardiovascular: Normal rate, regular rhythm, normal heart sounds and intact distal pulses.  Exam reveals no gallop.   Pulmonary/Chest: Effort normal and breath sounds normal. No respiratory distress. She has no wheezes. She has no rales.  No crackles  Abdominal: Soft. Bowel sounds are normal. She exhibits no distension, no abdominal bruit and no mass. There is no tenderness.  Musculoskeletal: She exhibits no edema.  L shoulder- tender over acromion  Pain with 90 deg of abduction Pos Hawking/Neer tests  Pain in int/ext rotation No swelling or crepitus  Neck=tender over lower CS Tender over trapezius bilateral  Lymphadenopathy:    She has no cervical adenopathy.  Neurological: She is alert. She has normal reflexes.  Pt has bilateral hand tremor-not pill rolling No bradykinesia Walks very carefully  Skin: Skin is warm and dry. No rash noted.  Psychiatric: Her speech is normal. Her mood appears anxious. Her affect is not blunt and not labile. She is not agitated, not slowed and not withdrawn. Cognition and memory are normal.          Assessment & Plan:   Problem List Items Addressed This Visit      Cardiovascular and Mediastinum   Essential hypertension - Primary    BP continues to be elevated when pt is anxious or in pain  BP: (!) 148/78    Will continue to watch Enc to check at home when she is feeling better also        Digestive   B12 deficiency    Due for B12 shot today Also check level in light of ongoing fatigue       Relevant Orders   Vitamin B12     Endocrine   Hypothyroidism    Due for a check-now with much more  compliant dosing of levothyroxine Pt continues to c/o shaking and  malaise/fatigue      Relevant Orders   TSH     Other   Tremor    Ongoing Pt would like to pursue neuro eval after GI issues and shoulder pain are treated She will let us know  tremor is worse when anxious - whole body Unsure if related to neck pain as well       Neck pain    Acute on chronic  Will address after GI issues and shoulder issues are treated       Left shoulder pain    Limited abduction  Pain over acromion  Suspect tendonitis xr today F/u Dr Lorelei Pont      Relevant Orders   DG Shoulder Left   Fatigue    Ongoing with malaise TSH and B12 level today Also B12 inj   For GI f/u soon  Not sleeping due to shoulder pain - will work that up  Also anxious       Anxiety disorder    On SSRI Stable  At times it makes her malaise/ shakiness worse  Reviewed stressors/ coping techniques/symptoms/ support sources/ tx options and side effects in detail today Disc strategies for avoiding "overdoing it" and general state of feeling overwhelmed        Other Visit Diagnoses    Heel pain, right       Relevant Medications   traMADol (ULTRAM) 50 MG tablet

## 2015-09-02 NOTE — Assessment & Plan Note (Signed)
On SSRI Stable  At times it makes her malaise/ shakiness worse  Reviewed stressors/ coping techniques/symptoms/ support sources/ tx options and side effects in detail today Disc strategies for avoiding "overdoing it" and general state of feeling overwhelmed

## 2015-09-02 NOTE — Patient Instructions (Addendum)
Follow up with GI as planned on Aug first  B12 shot today Labs for thyroid today  Tramadol refilled  Keep a walker with you when you feel weak  Xray of shoulder today and we will make an appt. With Dr Lorelei Pont    Our next step will be to get you to neurology for tremor and neck pain   It is time for your husband to help out with some of your obligations regarding records and paperwork. (or hire someone to help)

## 2015-09-02 NOTE — Assessment & Plan Note (Signed)
Due for B12 shot today Also check level in light of ongoing fatigue

## 2015-09-02 NOTE — Progress Notes (Signed)
Pre visit review using our clinic review tool, if applicable. No additional management support is needed unless otherwise documented below in the visit note. 

## 2015-09-02 NOTE — Assessment & Plan Note (Signed)
Ongoing with malaise TSH and B12 level today Also B12 inj   For GI f/u soon  Not sleeping due to shoulder pain - will work that up  Also anxious

## 2015-09-02 NOTE — Assessment & Plan Note (Signed)
Ongoing Pt would like to pursue neuro eval after GI issues and shoulder pain are treated She will let us know  tremor is worse when anxious - whole body Unsure if related to neck pain as well

## 2015-09-02 NOTE — Assessment & Plan Note (Signed)
BP continues to be elevated when pt is anxious or in pain  BP: (!) 148/78    Will continue to watch Enc to check at home when she is feeling better also

## 2015-09-02 NOTE — Assessment & Plan Note (Signed)
Limited abduction  Pain over acromion  Suspect tendonitis xr today F/u Dr Lorelei Pont

## 2015-09-02 NOTE — Addendum Note (Signed)
Addended by: Marchia Bond on: 09/02/2015 04:18 PM   Modules accepted: Orders

## 2015-09-02 NOTE — Assessment & Plan Note (Signed)
Acute on chronic  Will address after GI issues and shoulder issues are treated

## 2015-09-03 ENCOUNTER — Telehealth: Payer: Self-pay | Admitting: Family Medicine

## 2015-09-03 MED ORDER — LEVOTHYROXINE SODIUM 100 MCG PO TABS: 100.0000 ug | ORAL_TABLET | Freq: Every day | ORAL | 3 refills | Status: DC

## 2015-09-03 NOTE — Telephone Encounter (Signed)
Patient returned Shapale's call. °

## 2015-09-03 NOTE — Telephone Encounter (Signed)
Addressed through results notes (xray)

## 2015-09-03 NOTE — Telephone Encounter (Signed)
B12 is improving  tsh is low- need to reduce her dose  We need to change her levothyroxine dose from 125 to 100 mcg Please call it in  D/c the 125 please  Re check tsh in 6 wk  This could be part of why she is shaky

## 2015-09-04 MED ORDER — LEVOTHYROXINE SODIUM 75 MCG PO TABS: 75.0000 ug | ORAL_TABLET | Freq: Every day | ORAL | 3 refills | Status: DC

## 2015-09-04 MED ORDER — LEVOTHYROXINE SODIUM 100 MCG PO TABS: 100.0000 ug | ORAL_TABLET | Freq: Every day | ORAL | 3 refills | Status: DC

## 2015-09-04 NOTE — Telephone Encounter (Signed)
Do get the B12 shot that day- (please make a nurse appt) thanks

## 2015-09-04 NOTE — Telephone Encounter (Signed)
appt scheduled and pt notified, also pt advise me that she has been taking 112 mcg of synthroid not 125 so she wanted to see if Dr. Glori Bickers still wanted her to take 163mcg. Per Dr. Glori Bickers cancel the 117mcg Rx and send in 26mcg. Done and pharmacy notified which Rx to fill

## 2015-09-04 NOTE — Telephone Encounter (Signed)
Pt notified of lab results and Dr. Marliss Coots comments. Rx sent to pharmacy and f/u lab appt scheduled  Pt wanted me to ask Dr. Glori Bickers should she keep getting B12 inj since b12 level was okay (she would get it the same day as her 6 week lab appt)

## 2015-09-16 ENCOUNTER — Ambulatory Visit: Payer: Medicare Other | Admitting: Family Medicine

## 2015-10-07 ENCOUNTER — Other Ambulatory Visit: Payer: Self-pay | Admitting: Family Medicine

## 2015-10-07 DIAGNOSIS — E039 Hypothyroidism, unspecified: Secondary | ICD-10-CM

## 2015-10-15 ENCOUNTER — Ambulatory Visit (INDEPENDENT_AMBULATORY_CARE_PROVIDER_SITE_OTHER): Payer: Medicare Other | Admitting: Family Medicine

## 2015-10-15 ENCOUNTER — Telehealth: Payer: Self-pay | Admitting: *Deleted

## 2015-10-15 ENCOUNTER — Other Ambulatory Visit (INDEPENDENT_AMBULATORY_CARE_PROVIDER_SITE_OTHER): Payer: Medicare Other

## 2015-10-15 DIAGNOSIS — E538 Deficiency of other specified B group vitamins: Secondary | ICD-10-CM

## 2015-10-15 DIAGNOSIS — E039 Hypothyroidism, unspecified: Secondary | ICD-10-CM

## 2015-10-15 LAB — TSH: TSH: 0.25 u[IU]/mL — ABNORMAL LOW (ref 0.35–4.50)

## 2015-10-15 MED ORDER — CYANOCOBALAMIN 1000 MCG/ML IJ SOLN
1000.0000 ug | Freq: Once | INTRAMUSCULAR | Status: AC
  Administered 2015-10-15: 1000 ug via INTRAMUSCULAR

## 2015-10-15 NOTE — Progress Notes (Signed)
Per 10/14/15 phone note Dr. Glori Bickers ordered b12 inj Q 6 weeks indefinitely.

## 2015-10-15 NOTE — Telephone Encounter (Signed)
-----   Message from [name], MD sent at 10/14/2015  4:34 PM EDT ----- Regarding: RE: B12 order for nurse visit tomorrow I would like her to get a B12 shot every 6 weeks indefinitely -thanks! ----- Message ----- From: Marchia Bond Sent: 10/14/2015   3:27 PM To: [name], MD Subject: B12 order for nurse visit tomorrow             Pt is coming in for b12 shot tomorrow. She recently had b12 labs . How long and how frequent is she to continue getting b12 inj before next labs are due? We have new documentation requirements for nurse visits and I"ll have to document it in the note.  Thanks Aniceto Boss

## 2015-10-16 ENCOUNTER — Telehealth: Payer: Self-pay | Admitting: *Deleted

## 2015-10-16 MED ORDER — LEVOTHYROXINE SODIUM 50 MCG PO TABS: 50.0000 ug | ORAL_TABLET | Freq: Every day | ORAL | 3 refills | Status: DC

## 2015-10-16 NOTE — Telephone Encounter (Signed)
Pt notified of lab results and Dr. Tower's comments Rx sent to pharmacy and lab appt scheduled  

## 2015-10-16 NOTE — Telephone Encounter (Signed)
-----   Message from [name], MD sent at 10/15/2015  5:47 PM EDT ----- TSH is still mildly low  She is on levothyroxine 75 mcg-please confirm that with her If that is correct please call in 50 mcg 1 po qd #30 3 ref  Re check tsh in approx 6 wk  Thanks

## 2015-11-18 ENCOUNTER — Encounter: Payer: Self-pay | Admitting: Family Medicine

## 2015-11-18 ENCOUNTER — Ambulatory Visit (INDEPENDENT_AMBULATORY_CARE_PROVIDER_SITE_OTHER): Payer: Medicare Other | Admitting: Family Medicine

## 2015-11-18 ENCOUNTER — Telehealth: Payer: Self-pay | Admitting: Family Medicine

## 2015-11-18 VITALS — BP 151/66 | HR 66 | Temp 99.2°F | Ht 61.5 in | Wt 138.8 lb

## 2015-11-18 DIAGNOSIS — N3000 Acute cystitis without hematuria: Secondary | ICD-10-CM | POA: Diagnosis not present

## 2015-11-18 DIAGNOSIS — K5901 Slow transit constipation: Secondary | ICD-10-CM

## 2015-11-18 DIAGNOSIS — R350 Frequency of micturition: Secondary | ICD-10-CM | POA: Diagnosis not present

## 2015-11-18 LAB — POC URINALSYSI DIPSTICK (AUTOMATED)
Bilirubin, UA: NEGATIVE
Glucose, UA: NEGATIVE
Ketones, UA: NEGATIVE
Nitrite, UA: NEGATIVE
Spec Grav, UA: >=1.03
Urobilinogen, UA: 0.2
pH, UA: 5.5

## 2015-11-18 MED ORDER — CIPROFLOXACIN HCL 250 MG PO TABS: 250.0000 mg | ORAL_TABLET | Freq: Two times a day (BID) | ORAL | 0 refills | Status: DC

## 2015-11-18 NOTE — Progress Notes (Signed)
Pre visit review using our clinic review tool, if applicable. No additional management support is needed unless otherwise documented below in the visit note. 

## 2015-11-18 NOTE — Patient Instructions (Addendum)
Call Dr Lorie Apley office to get your appt with urology in [place] as soon as possible  Please call me with the name of the doctor you will see so I can send some records  Take tylenol (acetaminophen)  if needed for fever/elevated temperature  Drink lots of water  Take the cipro as directed with food twice daily  Update me if worse or no improvement in several days

## 2015-11-18 NOTE — Telephone Encounter (Signed)
She needs to be seen - please put in with first available -- if her husband drives her we can bring her in with a wheelchair if needed  Also remember to hydrate-drink lots of fluids

## 2015-11-18 NOTE — Telephone Encounter (Signed)
Pt has h/a,body aches, frequency of urine, pt feels to sick and dizzy to come out of the house and wants to know if pt's husband can pick up urine kit to have urine checked without pt being seen.Please advise.

## 2015-11-18 NOTE — Telephone Encounter (Signed)
Patient Name: Dawn Aguirre  DOB: 28-Dec-1925    Initial Comment Caller states has UTI, wants authorization for someone to come and p/u a kit today; pain with urination and frequency;    Nurse Assessment  Nurse: Raphael Gibney, RN, Vanita Ingles Date/Time (Eastern Time): 11/18/2015 11:54:50 AM  Confirm and document reason for call. If symptomatic, describe symptoms. You must click the next button to save text entered. ---Caller states she has UTI. She is having frequent urination with body aches. She is wanting to know if spouse can pick up a urine kit to send out for urine specimen as she does not feel like coming to the office. Temp 99.  Has the patient traveled out of the country within the last 30 days? ---Not Applicable  Does the patient have any new or worsening symptoms? ---Yes  Will a triage be completed? ---Yes  Related visit to physician within the last 2 weeks? ---No  Does the PT have any chronic conditions? (i.e. diabetes, asthma, etc.) ---No  Is this a behavioral health or substance abuse call? ---No     Guidelines    Guideline Title Affirmed Question Affirmed Notes  Urinary Symptoms Urinating more frequently than usual (i.e., frequency)    Final Disposition User   See Physician within 24 Hours Stringer, RN, Vanita Ingles    Comments  pt does not feel like coming in for an appt but wants to know if her spouse can come get a urine kit for a urine specimen. Please call pt back to let her know about urine kit.   Referrals  GO TO FACILITY REFUSED   Disagree/Comply: Comply

## 2015-11-18 NOTE — Progress Notes (Signed)
Subjective:    Patient ID: Dawn Aguirre, female    DOB: 10-12-1925, 82 y.o.   MRN: [id number]  HPI Here for malaise and urinary symptoms as well as headache and generalized body aches and dizziness (unsteady) == started a few days ago   Urinary symptoms- frequency- all night , some dysuria last night  No blood in urine/ no odor  Had a decent urine production  Had the chills last night with body aches   Has "dropped bladder"   Queasy and does not have an appetite   Also head ache and body hurt all over   Is drinking lots of fluids last night and today   Temp is 99.2  UA - wbc and some blood  Results for orders placed or performed in visit on 11/18/15  POCT Urinalysis Dipstick (Automated)  Result Value Ref Range   Color, UA yellow    Clarity, UA clear    Glucose, UA negative    Bilirubin, UA negative    Ketones, UA negative    Spec Grav, UA >=1.030    Blood, UA trace    pH, UA 5.5    Protein, UA trace    Urobilinogen, UA 0.2    Nitrite, UA negative    Leukocytes, UA large (3+) (A) Negative      Wt Readings from Last 3 Encounters:  11/18/15 138 lb 12 oz (62.9 kg)  09/02/15 143 lb (64.9 kg)  08/26/15 142 lb 12 oz (64.8 kg)      Last urine cx  9/16 and then again 3/17 showed no growth -yet both urine samples dipped pos for wbc and blood     Chemistry      Component Value Date/Time   NA 140 07/21/2015 1012   K 3.8 07/21/2015 1012   CL 105 07/21/2015 1012   CO2 26 07/21/2015 1012   BUN 18 07/21/2015 1012   CREATININE 1.12 07/21/2015 1012      Component Value Date/Time   CALCIUM 9.2 07/21/2015 1012   ALKPHOS 54 07/21/2015 1012   AST 21 07/21/2015 1012   ALT 17 07/21/2015 1012   BILITOT 0.6 07/21/2015 1012     Lab Results  Component Value Date   WBC 6.2 07/21/2015   HGB 11.9 (L) 07/21/2015   HCT 36.2 07/21/2015   MCV 90.3 07/21/2015   PLT 162.0 07/21/2015    Lab Results  Component Value Date   VITAMINB12 350 09/02/2015   Last vit D 29.7 in  June   Has seen Dr Collene Mares since last visit for chronic constipation and bloating  Per pt she thought these problems were related to displaced bladder  She sent her to a specialist in [place] (urology for bladder)-to consider pessary or a bladder tack up  Pessary has not worked for her in the past    Patient Active Problem List   Diagnosis Date Noted  . Left shoulder pain 09/02/2015  . Neck pain 09/02/2015  . Allergic dermatitis 08/05/2015  . Routine general medical examination at a health care facility 07/22/2015  . Hearing loss 07/22/2015  . Hyperglycemia 07/22/2015  . UTI (urinary tract infection) 04/24/2015  . Anxiety disorder 04/24/2015  . Abdominal bloating 03/06/2015  . Tremor 12/20/2014  . Chronic pain 10/14/2014  . B12 deficiency 07/16/2014  . Vitamin D deficiency 07/16/2014  . Fatigue 07/04/2014  . Encounter for Medicare annual wellness exam 01/13/2014  . Fall in home 12/24/2013  . History of breast cancer 12/18/2013  . Epiploic  appendagitis 02/22/2013  . Dry mouth 02/04/2013  . Chest wall pain 12/25/2012  . Benign paroxysmal positional vertigo 09/14/2012  . Low back pain 09/07/2012  . Constipation 11/04/2011  . Aortic stenosis 08/31/2010  . Hyperlipidemia   . GASTRITIS 04/01/2009  . STRESS REACTION, ACUTE, WITH EMOTIONAL DISTURBANCE 10/12/2007  . [place] DISEASE, CERVICAL 10/12/2007  . Hypothyroidism 01/22/2007  . RAYNAUD'S SYNDROME 01/22/2007  . EXTERNAL HEMORRHOIDS 01/22/2007  . COPD 01/22/2007  . DIVERTICULOSIS, COLON 01/22/2007  . FIBROCYSTIC BREAST DISEASE 01/22/2007  . Osteoporosis 01/22/2007  . Essential hypertension 12/16/2006  . GERD 12/16/2006  . RHEUMATOID ARTHRITIS 12/16/2006  . DYSPNEA ON EXERTION 12/16/2006  . COUGH, CHRONIC 12/16/2006  . TOBACCO ABUSE, HX OF 12/16/2006  . HYSTERECTOMY, HX OF 12/16/2006   Past Medical History:  Diagnosis Date  . Acquired absence of organ, genital organs   . Breast cancer ([place])    surgery at [facility]  . Chronic  airway obstruction, not elsewhere classified   . Colitis, ischemic ([place]) 07/2000   ? infect  . Cough   . Degeneration of cervical intervertebral disc   . Diffuse cystic mastopathy   . Diverticulosis of colon (without mention of hemorrhage)   . Esophageal reflux   . External hemorrhoids without mention of complication   . Fibula fracture 2006  . Flatulence, eructation, and gas pain   . Hyperlipidemia   . Lichen sclerosus    back/abd/vulvar  . Mastoiditis of right side 04/2005   admitted  . Osteoporosis, unspecified   . Other acute sinusitis   . Other and unspecified hyperlipidemia   . Other dyspnea and respiratory abnormality   . Personal history of tobacco use, presenting hazards to health   . Predominant disturbance of emotions   . Raynaud's syndrome   . Rheumatoid arthritis(714.0)    Sero-negative  . Spinal stenosis   . Unspecified essential hypertension   . Unspecified gastritis and gastroduodenitis without mention of hemorrhage   . Unspecified hypothyroidism   . Wears dentures    top   Past Surgical History:  Procedure Laterality Date  . ABDOMINAL HYSTERECTOMY  1968  . Cardiollite  02/2002   Neg  . COLONOSCOPY    . DEXA  2001   OP,heel  . DEXA     OP hip T-2.94  . NASAL SINUS SURGERY  05/2002  . Nuclear Stress Test  11/07   Normal  . TONSILLECTOMY    . US ECHOCARDIOGRAPHY  10/06   normal LVF EF 55-65%   Social History  Substance Use Topics  . Smoking status: Former Smoker    Quit date: 02/07/1962  . Smokeless tobacco: Not on file  . Alcohol use 0.0 oz/week     Comment: occ wine   Family History  Problem Relation Age of Onset  . Heart attack Father 42  . Coronary artery disease Sister 53    CABG  . Heart attack Brother 15  . Coronary artery disease      GF   Allergies  Allergen Reactions  . Ace Inhibitors     REACTION: cough  . Ezetimibe     REACTION: myalgia  . Ezetimibe-Simvastatin     REACTION: joints swell  . Macrobid [Nitrofurantoin]      ABD PAIN, ALSO BODY ACHES/PAIN  . Moxifloxacin   . Rosuvastatin     REACTION: myalgia  . Septra [Sulfamethoxazole-Trimethoprim]     Sick feeling   . Simvastatin     REACTION: myalgia  . Triaminic    Current  Outpatient Prescriptions on File Prior to Visit  Medication Sig Dispense Refill  . Cholecalciferol (VITAMIN D) 2000 units tablet Take 2,000 Units by mouth daily.    . cyanocobalamin (,VITAMIN B-12,) 1000 MCG/ML injection Inject 1,000 mcg into the muscle every 6 (six) weeks.    Marland Kitchen escitalopram (LEXAPRO) 20 MG tablet Take 1 tablet (20 mg total) by mouth daily. 30 tablet 11  . levothyroxine (SYNTHROID, LEVOTHROID) 50 MCG tablet Take 1 tablet (50 mcg total) by mouth daily. 30 tablet 3  . Polyethylene Glycol 3350 (MIRALAX PO) Take by mouth.    . Probiotic Product (ALIGN PO) Take 1 tablet by mouth daily.    . traMADol (ULTRAM) 50 MG tablet TAKE 1 TO 2 TABLETS BY MOUTH UP TO 3 TIMES A DAY AS NEEDED FOR PAIN 90 tablet 0  . triamcinolone cream (KENALOG) 0.1 % Apply 1 application topically 2 (two) times daily. 30 g 0   No current facility-administered medications on file prior to visit.     Review of Systems    Review of Systems  Constitutional: Negative for fever, appetite change, and unexpected weight change. pos for fatigue and malaise Eyes: Negative for pain and visual disturbance.  Respiratory: Negative for cough and shortness of breath.   Cardiovascular: Negative for cp or palpitations    Gastrointestinal: Negative for nausea, diarrhea and pos for constipation. and bloating Genitourinary: pos for urgency and frequency. pos for cystocele with urinary retention  Skin: Negative for pallor or rash   Neurological: Negative for weakness, light-, numbness and headaches. pos for tremor  Hematological: Negative for adenopathy. Does not bruise/bleed easily.  Psychiatric/Behavioral: Negative for dysphoric mood. The patient is  nervous/anxious.      Objective:   Physical Exam    Constitutional: She appears well-developed and well-nourished. No distress.  Frail appearing elderly female   HENT:  Head: Normocephalic and atraumatic.  Mouth/Throat: Oropharynx is clear and moist.  Eyes: Conjunctivae and EOM are normal. Pupils are equal, round, and reactive to light.  Neck: Normal range of motion. Neck supple.  Cardiovascular: Normal rate, regular rhythm and normal heart sounds.   Pulmonary/Chest: Effort normal and breath sounds normal.  Abdominal: Soft. Bowel sounds are normal. She exhibits no distension. There is tenderness. There is no rebound.  No cva tenderness  Mild suprapubic tenderness  Musculoskeletal: She exhibits no edema.  Lymphadenopathy:    She has no cervical adenopathy.  Neurological: She is alert.  Mild tremor of bilat hands worse when ambulating or feeling anxious   Skin: No rash noted. No pallor.  Psychiatric: She has a normal mood and affect.          Assessment & Plan:   Problem List Items Addressed This Visit      Digestive   Constipation    Per pt -she saw GI who though that her cystocele was interfering with bowel evacuation and she was ref to urol in [facility]- planning that now         Genitourinary   UTI (urinary tract infection) - Primary    UA is pos for leuk and rbc as usual -pending urine cx  Having symptoms incl inc temp and malaise and urinary symptoms  Pending urol visit in [place] for this and large cystocele (per pt) Will cover with cipro  Pending cx Enc fluids  Disc s/s of worse infection - when to call or go to ED      Relevant Orders   Urine culture    Other Visit  Diagnoses    Urinary frequency       Relevant Orders   POCT Urinalysis Dipstick (Automated) (Completed)

## 2015-11-18 NOTE — Telephone Encounter (Signed)
Patient called and I let her know Dr.Tower's comments.  Patient scheduled appointment for today at 5:00 with Dr.Tower.

## 2015-11-19 LAB — URINE CULTURE

## 2015-11-19 NOTE — Assessment & Plan Note (Signed)
Per pt -she saw GI who though that her cystocele was interfering with bowel evacuation and she was ref to urol in [place]- planning that now

## 2015-11-19 NOTE — Assessment & Plan Note (Signed)
UA is pos for leuk and rbc as usual -pending urine cx  Having symptoms incl inc temp and malaise and urinary symptoms  Pending urol visit in [place] for this and large cystocele (per pt) Will cover with cipro  Pending cx Enc fluids  Disc s/s of worse infection - when to call or go to ED

## 2015-11-25 ENCOUNTER — Other Ambulatory Visit (INDEPENDENT_AMBULATORY_CARE_PROVIDER_SITE_OTHER): Payer: Medicare Other

## 2015-11-25 DIAGNOSIS — E038 Other specified hypothyroidism: Secondary | ICD-10-CM

## 2015-11-25 LAB — TSH: TSH: 6.3 u[IU]/mL — ABNORMAL HIGH (ref 0.35–4.50)

## 2015-11-26 ENCOUNTER — Telehealth: Payer: Self-pay | Admitting: *Deleted

## 2015-12-01 ENCOUNTER — Telehealth: Payer: Self-pay

## 2015-12-01 MED ORDER — TRAMADOL HCL 50 MG PO TABS: ORAL_TABLET | ORAL | 0 refills | Status: DC

## 2015-12-01 NOTE — Telephone Encounter (Signed)
Please call in tramadol (use with caution of sedation and constipation) I would not expect her to still have pain from uti - does she think she is not better?  [place] ?

## 2015-12-01 NOTE — Telephone Encounter (Signed)
Pt left v/m; pt was seen 11/18/15; pt wants to know what pt can have for abd pain due to UTI. Also request refill tramadol. Last refilled # 90 on 09/02/15. Pt request [email or web address].

## 2015-12-02 MED ORDER — LEVOTHYROXINE SODIUM 75 MCG PO TABS: 75.0000 ug | ORAL_TABLET | Freq: Every day | ORAL | 1 refills | Status: DC

## 2015-12-02 NOTE — Telephone Encounter (Signed)
She needs to let her GI doctor know that she has abdominal pain - if it worsens in severity (she refers to it as severe)- go to ED after hours if necessary  Is her pain upper or lower (above or below umbilicus)? Is she able to empty her bladder. If she cannot get in to a urologist asap we need to get her a new one around here.  Please repeat urine culture if urinary symptoms are not better.  Thanks

## 2015-12-02 NOTE — Telephone Encounter (Signed)
Pt notified of Dr. Marliss Coots comments/ recommendations and verbalized understanding. Pt will call GI doctor and update them on abd pain, pt said abd pain last night was all over stomach from under breast all the way down but now it's really right around/above her belly button (not real low), pt said she will f/u with Dr. Lorie Apley office about the referral because she wants to stick with the urologist he is referring her to. Pt said she is having problems emptying her bladder she will go to the bathroom but still feels like she is retaining some urine. Pt will drop off a urine sample for culture, pt said she has a prolapse bladder so she knows most of her sxs are from that and that's why she wants to see the specialist Dr. Collene Mares is referring her to because he specializes in this issue

## 2015-12-02 NOTE — Telephone Encounter (Signed)
Thanks - let's re check urine-keep me posted  Do call your GI  If symptoms worsen- go to ED if necessary

## 2015-12-02 NOTE — Telephone Encounter (Signed)
Pt advise

## 2015-12-02 NOTE — Telephone Encounter (Signed)
Rx called in as prescribed, left voicemail requesting pt to call the office back

## 2015-12-02 NOTE — Telephone Encounter (Signed)
Pt returned call. Please call back.

## 2015-12-02 NOTE — Telephone Encounter (Signed)
Med refilled.

## 2015-12-02 NOTE — Telephone Encounter (Signed)
Pt said she isn't sure if her UTI has resolved because she has had severe abd pain pt said it's worse then before to the point it kept her up at night. Pt said that she was referred to urologist in [place] by Dr. Collene Mares and they are working on getting her an appt but she doesn't have one yet, she said she has been busy with her son's Shanon Brow) recent appointments so she didn't have time to get her appt scheduled but she's going to get it scheduled now when they call her

## 2015-12-03 NOTE — Telephone Encounter (Signed)
Husband came and picked up supplies

## 2015-12-03 NOTE — Telephone Encounter (Signed)
Patient called and said she feels too bad to come in and leave a urine sample.  Patient wants to know if she can have  someone  come by and pick up a "kit" and bring it back to the lab.  Please call patient.

## 2015-12-04 ENCOUNTER — Other Ambulatory Visit: Payer: Medicare Other

## 2015-12-04 DIAGNOSIS — Z87440 Personal history of urinary (tract) infections: Secondary | ICD-10-CM

## 2015-12-04 DIAGNOSIS — R339 Retention of urine, unspecified: Secondary | ICD-10-CM

## 2015-12-07 LAB — URINE CULTURE

## 2015-12-07 MED ORDER — CEPHALEXIN 500 MG PO CAPS: 500.0000 mg | ORAL_CAPSULE | Freq: Two times a day (BID) | ORAL | 0 refills | Status: DC

## 2015-12-07 NOTE — Progress Notes (Signed)
Pt's urine culture shows e coli that is resistant to cipro this time (also resistant to sulfa)  I sent in a px for keflex (take twice daily with food) to her pharmacy  Update if no clinical improvement Drink water also

## 2015-12-07 NOTE — Addendum Note (Signed)
Addended by: Loura Pardon A on: 12/07/2015 04:55 PM   Modules accepted: Orders

## 2015-12-21 ENCOUNTER — Telehealth: Payer: Self-pay

## 2015-12-21 NOTE — Telephone Encounter (Signed)
Ok- let me know tomorrow - If we need to do the referral-just give me the name of the doctor  Drink water If fever/ n/v/ severe pain -go to the ED on the off chance you need to be catheterized (I do worry she may not be emptying her bladder well)

## 2015-12-21 NOTE — Telephone Encounter (Signed)
Pt notified of Dr. Marliss Coots comments and recommendations. Pt said she wants to still f/u with Dr. Lorie Apley referral, she will call them in the morning and try to check the status of the referral, if she can't get through to Dr. Lorie Apley office her husband has an appt with the urologist tomorrow and she will "check out that practice" to see if she would like to be referred there. Pt will drop off a urine sample tomorrow and she said that the Keflex didn't help at all her sxs never went away

## 2015-12-21 NOTE — Telephone Encounter (Signed)
Tried to addend the 12/04/15 note but not sure if Dr Glori Bickers received. Pt left v/m; abx has not helped UTI. Pt is more uncomfortable and pt wants to know if can get different med or does pt need appt. Pt requset cb [phone].

## 2015-12-21 NOTE — Progress Notes (Signed)
Pt left v/m; the abx has not helped the UTI; pt is more uncomfortable and wants to know if can get different med or does pt need to be seen. Pt request cb [phone].

## 2015-12-21 NOTE — Telephone Encounter (Signed)
I do not think we can wait much longer for a urology referral - this has been months waiting to even schedule her appt and we need help with her problem  Perhaps we should refer her to urology closer by to start with ?  Also - I want to get another urine culture going to see if the keflex helped the last one- like I said , her options were limited for that infection Did the keflex help at all when she was on it?  Thanks

## 2015-12-21 NOTE — Telephone Encounter (Signed)
Pt notified of Dr. Marliss Coots comments/ instructions and verbalized understanding. Pt will try to get the name of the urologist so we can do an urgent referral if need be tomorrow and she will also drop off a urine sample

## 2015-12-21 NOTE — Telephone Encounter (Signed)
We are limited- her cx was resistant to the cipro/levaquin drugs and sulfa  She cannot take macrobid Was on keflex She needs to see urology-does she have an appt with her new urologist yet?

## 2015-12-21 NOTE — Telephone Encounter (Signed)
Spoke with pt and she said she had a really bad night she was up almost every 3 hrs (her son Shanon Brow was up too every 2 hrs. ), pt said she doesn't have a f/u appt scheduled she advise me that Dr. Collene Mares was suppose to refer her to urologist in [facility] and she called them last week to check the status of the referral and hasn't heard back from them, pt wants to know what to do in the mean time, please advise

## 2015-12-22 ENCOUNTER — Other Ambulatory Visit: Payer: Medicare Other

## 2015-12-22 DIAGNOSIS — R350 Frequency of micturition: Secondary | ICD-10-CM

## 2015-12-22 DIAGNOSIS — Z87440 Personal history of urinary (tract) infections: Secondary | ICD-10-CM

## 2015-12-23 ENCOUNTER — Encounter: Payer: Self-pay | Admitting: Family Medicine

## 2015-12-23 ENCOUNTER — Ambulatory Visit (INDEPENDENT_AMBULATORY_CARE_PROVIDER_SITE_OTHER)
Admission: RE | Admit: 2015-12-23 | Discharge: 2015-12-23 | Disposition: A | Discharge status: Home / Self Care | Payer: Medicare Other | Source: Ambulatory Visit | Attending: Family Medicine | Admitting: Family Medicine

## 2015-12-23 ENCOUNTER — Telehealth: Payer: Self-pay | Admitting: Family Medicine

## 2015-12-23 ENCOUNTER — Ambulatory Visit (INDEPENDENT_AMBULATORY_CARE_PROVIDER_SITE_OTHER): Payer: Medicare Other | Admitting: Family Medicine

## 2015-12-23 VITALS — BP 146/80 | HR 64 | Temp 98.0°F | Ht 61.5 in | Wt 141.4 lb

## 2015-12-23 DIAGNOSIS — R05 Cough: Secondary | ICD-10-CM

## 2015-12-23 DIAGNOSIS — R059 Cough, unspecified: Secondary | ICD-10-CM

## 2015-12-23 MED ORDER — BENZONATATE 200 MG PO CAPS: 200.0000 mg | ORAL_CAPSULE | Freq: Three times a day (TID) | ORAL | 0 refills | Status: DC | PRN

## 2015-12-23 MED ORDER — GUAIFENESIN ER 600 MG PO TB12: 600.0000 mg | ORAL_TABLET | ORAL | Status: DC

## 2015-12-23 NOTE — Progress Notes (Signed)
ucx pending.  Off abx.  Prev on keflex.  H/o bladder prolapse.  Still with burning with urination, no changed with keflex.  Urine is usually clear.    Cough.  Started about 5 days ago.  Sputum production.  Saturday night she coughed through the night. Diffuse aches prev "like I had been crushed by a truck."  Those aches resolved in the meantime.  Cough worse at night.  No fevers.  Some chills.  No ear pain, some rhinorrhea. No ST, other than from coughing.  No rash, no LA noted.  She isn't worse, she may be slightly better.  The first night was clearly the worst night.       L shoulder pain at baseline, this isn't new.    Meds, vitals, and allergies reviewed.   ROS: Per HPI unless specifically indicated in ROS section   GEN: nad, alert and oriented HEENT: mucous membranes moist, TM w/o erythema, nasal exam with slight clear discharge NECK: supple w/o LA CV: rrr. PULM: ctab, no inc wob, no wheeze, no dec in BS, totally normal exam.  ABD: soft, +bs EXT: no edema Speaking in complete sentences.  Zero cough at any point in the exam.

## 2015-12-23 NOTE — Patient Instructions (Signed)
Go to the lab on the way out.  We'll contact you with your xray report. Take mucinex (not mucinex D) in the AM with plenty of water.  Take tessalon at night to help with the cough.  We'll contact you with your culture report when it is available.  Take care.  Glad to see you.

## 2015-12-23 NOTE — Progress Notes (Signed)
Pre visit review using our clinic review tool, if applicable. No additional management support is needed unless otherwise documented below in the visit note. 

## 2015-12-23 NOTE — Telephone Encounter (Signed)
Patient Name: Dawn Aguirre  DOB: [birth year]    Initial Comment Caller states coughing up a lot of mucus, severe coughing spells, pain in abd and chest, also has a UTI   Nurse Assessment  Nurse: Leilani Merl, RN, Heather Date/Time (Eastern Time): 12/23/2015 11:47:30 AM  Confirm and document reason for call. If symptomatic, describe symptoms. You must click the next button to save text entered. ---Caller states coughing up a lot of mucus, severe coughing spells, pain in abd and chest, also has a UTI that she is being treated for currently  Has the patient traveled out of the country within the last 30 days? ---Not Applicable  Does the patient have any new or worsening symptoms? ---Yes  Will a triage be completed? ---Yes  Related visit to physician within the last 2 weeks? ---No  Does the PT have any chronic conditions? (i.e. diabetes, asthma, etc.) ---Yes  List chronic conditions. ---See MR  Is this a behavioral health or substance abuse call? ---No     Guidelines    Guideline Title Affirmed Question Affirmed Notes  Cough - Acute Productive [1] Fever > 101 F (38.3 C) AND [2] age > 23    Final Disposition User   See Physician within 4 Hours (or PCP triage) Leilani Merl, RN, Nira Conn    Comments  Appt made for 2 pm today with Dr. Damita Dunnings.   Referrals  REFERRED TO PCP OFFICE   Disagree/Comply: Comply

## 2015-12-23 NOTE — Telephone Encounter (Signed)
Pt has appt with Dr Damita Dunnings 12/23/15 at [facility].

## 2015-12-24 NOTE — Assessment & Plan Note (Addendum)
Speaking in complete sentences.  Zero cough at any point in the exam.  Lungs clear.  See CXR.  Take mucinex (not mucinex D) in the AM with plenty of water.  Take tessalon at night to help with the cough.  Update [place] as needed.  Didn't start abx.

## 2015-12-25 ENCOUNTER — Telehealth: Payer: Self-pay | Admitting: *Deleted

## 2015-12-25 LAB — URINE CULTURE

## 2015-12-25 MED ORDER — AMOXICILLIN-POT CLAVULANATE 875-125 MG PO TABS: 1.0000 | ORAL_TABLET | Freq: Two times a day (BID) | ORAL | 0 refills | Status: DC

## 2015-12-25 NOTE — Telephone Encounter (Signed)
  Dr. Glori Bickers sent me a message saying :  Urine culture is still positive for e coli  It has multiple abx resistance profile  She has been on keflex  I could give macrobid but it it on her intol list  It is sensitive to augmentin - if agreeable please call in augmentin 875 mg 1 po bid #10 no ref  Does she have urology appt yet- we really need to get her seen!    Called pt and advise of of urine cx and Dr. Marliss Coots recommendations. Rx sent to pharmacy for augmentin.  Pt told me that she called Dr. Lorie Apley office twice and hasn't heard back from them. I offered multiple times for [place] to put a referral in for her to see the urologist and pt declined. She said that she wants to deal with one issue at a time and she just saw Dr. Damita Dunnings with congestion issues and she is still having abd pain and also she has "alot going on with her son" pt said once she gets over all of this stuff she'll go from there but did want to discuss it with Dr. Glori Bickers 1st. Due to holiday week next week pt scheduled appt for 01/04/16

## 2015-12-30 ENCOUNTER — Ambulatory Visit (INDEPENDENT_AMBULATORY_CARE_PROVIDER_SITE_OTHER): Payer: Medicare Other | Admitting: Family Medicine

## 2015-12-30 ENCOUNTER — Encounter: Payer: Self-pay | Admitting: Family Medicine

## 2015-12-30 VITALS — BP 146/72 | HR 73 | Temp 98.3°F | Ht 61.5 in | Wt 142.5 lb

## 2015-12-30 DIAGNOSIS — N8110 Cystocele, unspecified: Secondary | ICD-10-CM | POA: Diagnosis not present

## 2015-12-30 DIAGNOSIS — K2900 Acute gastritis without bleeding: Secondary | ICD-10-CM | POA: Diagnosis not present

## 2015-12-30 DIAGNOSIS — N3000 Acute cystitis without hematuria: Secondary | ICD-10-CM | POA: Diagnosis not present

## 2015-12-30 DIAGNOSIS — K5901 Slow transit constipation: Secondary | ICD-10-CM

## 2015-12-30 MED ORDER — OMEPRAZOLE 20 MG PO CPDR: 20.0000 mg | DELAYED_RELEASE_CAPSULE | Freq: Every day | ORAL | 3 refills | Status: DC

## 2015-12-30 NOTE — Progress Notes (Signed)
Pre visit review using our clinic review tool, if applicable. No additional management support is needed unless otherwise documented below in the visit note. 

## 2015-12-30 NOTE — Patient Instructions (Addendum)
Glad your urine symptoms are a bit improved You need urology attention for the bladder  I will refer you to urology today  Finish your augmentin - take it with food   For stomach- take the omeprazole (to decrease stomach acid)  If this does not help pain or acid refux let me know (zantac did not work in the past)  Take your first dose as soon as you get it  Then one each am   If symptoms suddenly worsen-go to the ER and alert [place]

## 2015-12-30 NOTE — Progress Notes (Signed)
Subjective:    Patient ID: Dawn Aguirre, female    DOB: May 16, 1925, 82 y.o.   MRN: [id number]  HPI  Here for f/u of abdominal pain   Not feeling "at all well"  Tired/sleeping a lot   Pain is coming from stomach area (upper abdomen)  A lot of belching - for 10 minutes at a time , then it helps  Has been on med for acid in the past  Zantac did not help    Constipated - on and off  Taking constipation  Had a good bm today  Prune juice    Coughing up a lot of phlegm   Tramadol is not helping   Stress- her son is not doing well   Last CT was 1/15    Wt Readings from Last 3 Encounters:  12/30/15 142 lb 8 oz (64.6 kg)  12/23/15 141 lb 6.4 oz (64.1 kg)  11/18/15 138 lb 12 oz (62.9 kg)   . Last urine cx showed e coli Was prev on keflex Then changed to augmentin   It is resistant to amp/cipro/gent/levoflox/trimeth and intermied to augmentin and tobramycin  Needs urology appt - waiting too long for ref in [place] She does not want to see Dr Tresa Moore Has had a pessary-that did not work  Thinks the augmentin is helping a little   She has had a hysterectomy   She declines a CT or other imaging study right now   Patient Active Problem List   Diagnosis Date Noted  . Female cystocele 12/30/2015  . Left shoulder pain 09/02/2015  . Neck pain 09/02/2015  . Allergic dermatitis 08/05/2015  . Routine general medical examination at a health care facility 07/22/2015  . Hearing loss 07/22/2015  . Hyperglycemia 07/22/2015  . UTI (urinary tract infection) 04/24/2015  . Anxiety disorder 04/24/2015  . Abdominal bloating 03/06/2015  . Tremor 12/20/2014  . Chronic pain 10/14/2014  . B12 deficiency 07/16/2014  . Vitamin D deficiency 07/16/2014  . Fatigue 07/04/2014  . Encounter for Medicare annual wellness exam 01/13/2014  . Fall in home 12/24/2013  . History of breast cancer 12/18/2013  . Epiploic appendagitis 02/22/2013  . Dry mouth 02/04/2013  . Chest wall pain 12/25/2012  .  Benign paroxysmal positional vertigo 09/14/2012  . Low back pain 09/07/2012  . Constipation 11/04/2011  . Aortic stenosis 08/31/2010  . Hyperlipidemia   . Gastritis 04/01/2009  . STRESS REACTION, ACUTE, WITH EMOTIONAL DISTURBANCE 10/12/2007  . [place] DISEASE, CERVICAL 10/12/2007  . Hypothyroidism 01/22/2007  . RAYNAUD'S SYNDROME 01/22/2007  . EXTERNAL HEMORRHOIDS 01/22/2007  . COPD 01/22/2007  . DIVERTICULOSIS, COLON 01/22/2007  . FIBROCYSTIC BREAST DISEASE 01/22/2007  . Osteoporosis 01/22/2007  . Essential hypertension 12/16/2006  . GERD 12/16/2006  . RHEUMATOID ARTHRITIS 12/16/2006  . DYSPNEA ON EXERTION 12/16/2006  . Cough 12/16/2006  . TOBACCO ABUSE, HX OF 12/16/2006  . HYSTERECTOMY, HX OF 12/16/2006   Past Medical History:  Diagnosis Date  . Acquired absence of organ, genital organs   . Breast cancer ([place])    surgery at [facility]  . Chronic airway obstruction, not elsewhere classified   . Colitis, ischemic ([place]) 07/2000   ? infect  . Cough   . Degeneration of cervical intervertebral disc   . Diffuse cystic mastopathy   . Diverticulosis of colon (without mention of hemorrhage)   . Esophageal reflux   . External hemorrhoids without mention of complication   . Fibula fracture 2006  . Flatulence, eructation, and gas pain   .  Hyperlipidemia   . Lichen sclerosus    back/abd/vulvar  . Mastoiditis of right side 04/2005   admitted  . Osteoporosis, unspecified   . Other acute sinusitis   . Other and unspecified hyperlipidemia   . Other dyspnea and respiratory abnormality   . Personal history of tobacco use, presenting hazards to health   . Predominant disturbance of emotions   . Raynaud's syndrome   . Rheumatoid arthritis(714.0)    Sero-negative  . Spinal stenosis   . Unspecified essential hypertension   . Unspecified gastritis and gastroduodenitis without mention of hemorrhage   . Unspecified hypothyroidism   . Wears dentures    top   Past Surgical History:  Procedure  Laterality Date  . ABDOMINAL HYSTERECTOMY  1968  . Cardiollite  02/2002   Neg  . COLONOSCOPY    . DEXA  2001   OP,heel  . DEXA     OP hip T-2.94  . NASAL SINUS SURGERY  05/2002  . Nuclear Stress Test  11/07   Normal  . TONSILLECTOMY    . US ECHOCARDIOGRAPHY  10/06   normal LVF EF 55-65%   Social History  Substance Use Topics  . Smoking status: Former Smoker    Quit date: 02/07/1962  . Smokeless tobacco: Not on file  . Alcohol use 0.0 oz/week     Comment: occ wine   Family History  Problem Relation Age of Onset  . Heart attack Father 75  . Coronary artery disease Sister 20    CABG  . Heart attack Brother 82  . Coronary artery disease      GF   Allergies  Allergen Reactions  . Ace Inhibitors     REACTION: cough  . Ezetimibe     REACTION: myalgia  . Ezetimibe-Simvastatin     REACTION: joints swell  . Macrobid [Nitrofurantoin]     ABD PAIN, ALSO BODY ACHES/PAIN  . Moxifloxacin   . Rosuvastatin     REACTION: myalgia  . Septra [Sulfamethoxazole-Trimethoprim]     Sick feeling   . Simvastatin     REACTION: myalgia  . Triaminic    Current Outpatient Prescriptions on File Prior to Visit  Medication Sig Dispense Refill  . amoxicillin-clavulanate (AUGMENTIN) 875-125 MG tablet Take 1 tablet by mouth 2 (two) times daily. 10 tablet 0  . benzonatate (TESSALON) 200 MG capsule Take 1 capsule (200 mg total) by mouth 3 (three) times daily as needed for cough. 30 capsule 0  . Cholecalciferol (VITAMIN D) 2000 units tablet Take 2,000 Units by mouth daily.    . cyanocobalamin (,VITAMIN B-12,) 1000 MCG/ML injection Inject 1,000 mcg into the muscle every 6 (six) weeks.    Marland Kitchen escitalopram (LEXAPRO) 20 MG tablet Take 1 tablet (20 mg total) by mouth daily. 30 tablet 11  . guaiFENesin (MUCINEX) 600 MG 12 hr tablet Take 1 tablet (600 mg total) by mouth every morning.    Marland Kitchen levothyroxine (SYNTHROID, LEVOTHROID) 75 MCG tablet Take 1 tablet (75 mcg total) by mouth daily before breakfast. 30  tablet 1  . Polyethylene Glycol 3350 (MIRALAX PO) Take by mouth.    . Probiotic Product (ALIGN PO) Take 1 tablet by mouth daily.    . traMADol (ULTRAM) 50 MG tablet TAKE 1 TO 2 TABLETS BY MOUTH UP TO 3 TIMES A DAY AS NEEDED FOR PAIN 90 tablet 0  . triamcinolone cream (KENALOG) 0.1 % Apply 1 application topically 2 (two) times daily. 30 g 0   No current facility-administered medications on  file prior to visit.     Review of Systems Review of Systems  Constitutional: Negative for fever, appetite change, and unexpected weight change. pos for fatigue and malaise (improved from this am) Eyes: Negative for pain and visual disturbance.  Respiratory: Negative for cough and shortness of breath.   Cardiovascular: Negative for cp or palpitations    Gastrointestinal: Negative for nausea, diarrhea and pos for constipation. pos for epigastric discomfort and burping and bloating Genitourinary: Negative for urgency and frequency.  Skin: Negative for pallor or rash   Neurological: Negative for weakness, light-headedness, numbness and headaches.  Hematological: Negative for adenopathy. Does not bruise/bleed easily.  Psychiatric/Behavioral: Negative for dysphoric mood. The patient is anxious and worried about family issues and inability to keep up with her jobs        Objective:   Physical Exam  Constitutional:  Well appearing elderly female   HENT:  Head: Normocephalic and atraumatic.  Mouth/Throat: Oropharynx is clear and moist.  Eyes: Conjunctivae and EOM are normal. Pupils are equal, round, and reactive to light.  Neck: Normal range of motion. Neck supple.  Cardiovascular: Normal rate and regular rhythm.   Murmur heard. Baseline AS M  Pulmonary/Chest: Effort normal and breath sounds normal. No respiratory distress. She has no wheezes. She has no rales. She exhibits no tenderness.  Abdominal: Soft. Bowel sounds are normal. She exhibits no distension and no mass. There is no hepatosplenomegaly.  There is tenderness in the epigastric area and suprapubic area. There is no rebound, no guarding, no CVA tenderness, no tenderness at McBurney's point and negative Murphy's sign.  Musculoskeletal: She exhibits no edema.  Limited rom of LS  Lymphadenopathy:    She has no cervical adenopathy.  Neurological: No cranial nerve deficit. She exhibits normal muscle tone. Coordination normal.  Skin: Skin is warm and dry. No rash noted. No erythema. No pallor.  Psychiatric: Her mood appears anxious.          Assessment & Plan:   Problem List Items Addressed This Visit      Digestive   Gastritis - Primary    Pt is having more epigastric pain lately  ? If related to her constipation and chronic GI issues that she attributes to her bladder problems (cystocele)  She has been on abx for chronic uti  No nsaid  Zantac has not helped  Will try omeprazole 20 mg daily in am  Also rev diet for gerd/gastritis  Will continue to work on urology appt for the future to help with all problems  Update if not starting to improve in a week or if worsening        Constipation    Ongoing  Per pt- hef GI provider told her she needed to see urologist for severe cystocele before she could work up GI issues (? Cystocele is keeping her from moving bowels)  She continues miralax Had bm this am and feels somewhat better          Genitourinary   UTI (urinary tract infection)    Currently on augmentin for multiple drug resistant /recurrent uti  Working on a urology ref- she wants to see the doctor in [place] that was suggested by Dr Collene Mares- but since she cannot get in touch with the office I think we should refer her to someone more urgently  Ref was made  Will continue to folllow      Relevant Orders   Ambulatory referral to Urology   Female cystocele  Per pt- this is making her unable to move bowels well and causing abd pain as well as recurrent uti  Ref to urology      Relevant Orders   Ambulatory  referral to Urology

## 2016-01-01 NOTE — Assessment & Plan Note (Signed)
Currently on augmentin for multiple drug resistant /recurrent uti  Working on a urology ref- she wants to see the doctor in [place] that was suggested by Dr Collene Mares- but since she cannot get in touch with the office I think we should refer her to someone more urgently  Ref was made  Will continue to folllow

## 2016-01-01 NOTE — Assessment & Plan Note (Signed)
Ongoing  Per pt- hef GI provider told her she needed to see urologist for severe cystocele before she could work up GI issues (? Cystocele is keeping her from moving bowels)  She continues miralax Had bm this am and feels somewhat better

## 2016-01-01 NOTE — Assessment & Plan Note (Signed)
Per pt- this is making her unable to move bowels well and causing abd pain as well as recurrent uti  Ref to urology

## 2016-01-01 NOTE — Assessment & Plan Note (Signed)
Pt is having more epigastric pain lately  ? If related to her constipation and chronic GI issues that she attributes to her bladder problems (cystocele)  She has been on abx for chronic uti  No nsaid  Zantac has not helped  Will try omeprazole 20 mg daily in am  Also rev diet for gerd/gastritis  Will continue to work on urology appt for the future to help with all problems  Update if not starting to improve in a week or if worsening

## 2016-01-04 ENCOUNTER — Ambulatory Visit: Payer: Medicare Other | Admitting: Family Medicine

## 2016-01-06 ENCOUNTER — Other Ambulatory Visit: Payer: Self-pay | Admitting: Family Medicine

## 2016-01-06 DIAGNOSIS — E038 Other specified hypothyroidism: Secondary | ICD-10-CM

## 2016-01-12 ENCOUNTER — Other Ambulatory Visit (INDEPENDENT_AMBULATORY_CARE_PROVIDER_SITE_OTHER): Payer: Medicare Other

## 2016-01-12 DIAGNOSIS — E038 Other specified hypothyroidism: Secondary | ICD-10-CM | POA: Diagnosis not present

## 2016-01-12 LAB — TSH: TSH: 9.87 u[IU]/mL — ABNORMAL HIGH (ref 0.35–4.50)

## 2016-01-13 ENCOUNTER — Ambulatory Visit (INDEPENDENT_AMBULATORY_CARE_PROVIDER_SITE_OTHER): Payer: Medicare Other | Admitting: Family Medicine

## 2016-01-13 ENCOUNTER — Encounter: Payer: Self-pay | Admitting: Family Medicine

## 2016-01-13 VITALS — BP 134/68 | HR 75 | Temp 98.3°F | Ht 61.5 in | Wt 142.2 lb

## 2016-01-13 DIAGNOSIS — F418 Other specified anxiety disorders: Secondary | ICD-10-CM | POA: Diagnosis not present

## 2016-01-13 DIAGNOSIS — K219 Gastro-esophageal reflux disease without esophagitis: Secondary | ICD-10-CM

## 2016-01-13 DIAGNOSIS — G479 Sleep disorder, unspecified: Secondary | ICD-10-CM | POA: Diagnosis not present

## 2016-01-13 DIAGNOSIS — M25512 Pain in left shoulder: Secondary | ICD-10-CM

## 2016-01-13 DIAGNOSIS — G8929 Other chronic pain: Secondary | ICD-10-CM

## 2016-01-13 MED ORDER — ALPRAZOLAM 0.5 MG PO TABS: 0.5000 mg | ORAL_TABLET | Freq: Every evening | ORAL | 1 refills | Status: DC | PRN

## 2016-01-13 MED ORDER — LEVOTHYROXINE SODIUM 75 MCG PO TABS: 75.0000 ug | ORAL_TABLET | Freq: Every day | ORAL | 3 refills | Status: DC

## 2016-01-13 MED ORDER — LEVOTHYROXINE SODIUM 100 MCG PO TABS: 100.0000 ug | ORAL_TABLET | Freq: Every day | ORAL | 3 refills | Status: DC

## 2016-01-13 NOTE — Progress Notes (Signed)
Subjective:    Patient ID: Dawn Aguirre, female    DOB: December 12, 1925, 82 y.o.   MRN: [id number]  HPI Here primarily for sleep problem (caused in part by aches and pains esp L shoulder)  Chronic pain is playing a role  Also a lot of phlegm from her stomach - ? Reflux -has seen GI who wanted her cystocele evaluated before a gi w/u   She goes to bed by 10  Usually falls asleep 4 am  She tosses and turns for the most part and then will get up and go on the computer or walk  Has not read a book recently Tries going back to bed  Sometimes she will drink water or eat a bowl of cereal if she feels hungry   She tried sleeping med otc -? What it was  Has not tried melatonin yet  Does not remember if benadryl has helped  No prescription sleep medicines   Stomach pain keeps her from sleeping Body pain keeps her from sleeping  Anxiety is another factor  Tramadol 1-2 pills up to tid - does not help pain at all  L shoulder is the most painful (cortisone shots with Dr Lynann Bologna) No f/u planned as of yet  Has had a recent xray w/in last 6 mo    No naps No caffeine   Walker by the bed Very careful not to fall    Urology- appt is in jan   3 of 7 nights she cares for her son   Also labs just came back for thyroid-  Lab Results  Component Value Date   TSH 9.87 (H) 01/12/2016   Need to inc dose of levothyroxine  This could also add to fatigue   Patient Active Problem List   Diagnosis Date Noted  . Sleep disorder 01/13/2016  . Female cystocele 12/30/2015  . Left shoulder pain 09/02/2015  . Neck pain 09/02/2015  . Allergic dermatitis 08/05/2015  . Routine general medical examination at a health care facility 07/22/2015  . Hearing loss 07/22/2015  . Hyperglycemia 07/22/2015  . UTI (urinary tract infection) 04/24/2015  . Anxiety disorder 04/24/2015  . Abdominal bloating 03/06/2015  . Tremor 12/20/2014  . Chronic pain 10/14/2014  . B12 deficiency 07/16/2014  . Vitamin D  deficiency 07/16/2014  . Fatigue 07/04/2014  . Encounter for Medicare annual wellness exam 01/13/2014  . Fall in home 12/24/2013  . History of breast cancer 12/18/2013  . Epiploic appendagitis 02/22/2013  . Dry mouth 02/04/2013  . Chest wall pain 12/25/2012  . Benign paroxysmal positional vertigo 09/14/2012  . Low back pain 09/07/2012  . Constipation 11/04/2011  . Aortic stenosis 08/31/2010  . Hyperlipidemia   . Gastritis 04/01/2009  . STRESS REACTION, ACUTE, WITH EMOTIONAL DISTURBANCE 10/12/2007  . [place] DISEASE, CERVICAL 10/12/2007  . Hypothyroidism 01/22/2007  . RAYNAUD'S SYNDROME 01/22/2007  . EXTERNAL HEMORRHOIDS 01/22/2007  . COPD 01/22/2007  . DIVERTICULOSIS, COLON 01/22/2007  . FIBROCYSTIC BREAST DISEASE 01/22/2007  . Osteoporosis 01/22/2007  . Essential hypertension 12/16/2006  . GERD 12/16/2006  . RHEUMATOID ARTHRITIS 12/16/2006  . DYSPNEA ON EXERTION 12/16/2006  . Cough 12/16/2006  . TOBACCO ABUSE, HX OF 12/16/2006  . HYSTERECTOMY, HX OF 12/16/2006   Past Medical History:  Diagnosis Date  . Acquired absence of organ, genital organs   . Breast cancer ([place])    surgery at [facility]  . Chronic airway obstruction, not elsewhere classified   . Colitis, ischemic ([place]) 07/2000   ? infect  .  Cough   . Degeneration of cervical intervertebral disc   . Diffuse cystic mastopathy   . Diverticulosis of colon (without mention of hemorrhage)   . Esophageal reflux   . External hemorrhoids without mention of complication   . Fibula fracture 2006  . Flatulence, eructation, and gas pain   . Hyperlipidemia   . Lichen sclerosus    back/abd/vulvar  . Mastoiditis of right side 04/2005   admitted  . Osteoporosis, unspecified   . Other acute sinusitis   . Other and unspecified hyperlipidemia   . Other dyspnea and respiratory abnormality   . Personal history of tobacco use, presenting hazards to health   . Predominant disturbance of emotions   . Raynaud's syndrome   . Rheumatoid  arthritis(714.0)    Sero-negative  . Spinal stenosis   . Unspecified essential hypertension   . Unspecified gastritis and gastroduodenitis without mention of hemorrhage   . Unspecified hypothyroidism   . Wears dentures    top   Past Surgical History:  Procedure Laterality Date  . ABDOMINAL HYSTERECTOMY  1968  . Cardiollite  02/2002   Neg  . COLONOSCOPY    . DEXA  2001   OP,heel  . DEXA     OP hip T-2.94  . NASAL SINUS SURGERY  05/2002  . Nuclear Stress Test  11/07   Normal  . TONSILLECTOMY    . US ECHOCARDIOGRAPHY  10/06   normal LVF EF 55-65%   Social History  Substance Use Topics  . Smoking status: Former Smoker    Quit date: 02/07/1962  . Smokeless tobacco: Not on file  . Alcohol use 0.0 oz/week     Comment: occ wine   Family History  Problem Relation Age of Onset  . Heart attack Father 35  . Coronary artery disease Sister 61    CABG  . Heart attack Brother 30  . Coronary artery disease      GF   Allergies  Allergen Reactions  . Ace Inhibitors     REACTION: cough  . Ezetimibe     REACTION: myalgia  . Ezetimibe-Simvastatin     REACTION: joints swell  . Macrobid [Nitrofurantoin]     ABD PAIN, ALSO BODY ACHES/PAIN  . Moxifloxacin   . Rosuvastatin     REACTION: myalgia  . Septra [Sulfamethoxazole-Trimethoprim]     Sick feeling   . Simvastatin     REACTION: myalgia  . Triaminic    Current Outpatient Prescriptions on File Prior to Visit  Medication Sig Dispense Refill  . benzonatate (TESSALON) 200 MG capsule Take 1 capsule (200 mg total) by mouth 3 (three) times daily as needed for cough. 30 capsule 0  . Cholecalciferol (VITAMIN D) 2000 units tablet Take 2,000 Units by mouth daily.    . cyanocobalamin (,VITAMIN B-12,) 1000 MCG/ML injection Inject 1,000 mcg into the muscle every 6 (six) weeks.    Marland Kitchen escitalopram (LEXAPRO) 20 MG tablet Take 1 tablet (20 mg total) by mouth daily. 30 tablet 11  . guaiFENesin (MUCINEX) 600 MG 12 hr tablet Take 1 tablet (600 mg  total) by mouth every morning.    Marland Kitchen omeprazole (PRILOSEC) 20 MG capsule Take 1 capsule (20 mg total) by mouth daily. 30 capsule 3  . Polyethylene Glycol 3350 (MIRALAX PO) Take by mouth.    . Probiotic Product (ALIGN PO) Take 1 tablet by mouth daily.    . traMADol (ULTRAM) 50 MG tablet TAKE 1 TO 2 TABLETS BY MOUTH UP TO 3 TIMES A  DAY AS NEEDED FOR PAIN 90 tablet 0  . triamcinolone cream (KENALOG) 0.1 % Apply 1 application topically 2 (two) times daily. 30 g 0   No current facility-administered medications on file prior to visit.     Review of Systems Review of Systems  Constitutional: Negative for fever, appetite change,  and unexpected weight change. pos for fatigue from poor sleep causing problems with concentration  Eyes: Negative for pain and visual disturbance.  Respiratory: Negative for cough and shortness of breath.   Cardiovascular: Negative for cp or palpitations    Gastrointestinal: Negative for nausea, diarrhea . pos for reflux of "mucous" and lots of gas and some constipation  Genitourinary: pos for urgency and frequency.  Skin: Negative for pallor or rash   MSK pos for joint pain /various as well as back pain - currently worse on L shoulder  Neurological: Negative for weakness, light-headedness, numbness and headaches.  Hematological: Negative for adenopathy. Does not bruise/bleed easily.  Psychiatric/Behavioral: Negative for dysphoric mood. Pos for anxiety and excess worry, neg for hopelessness or SI         Objective:   Physical Exam  Constitutional:  Frail appearing elderly female   Exam not done do to very long interview and no time for exam today (was given a short urgent appt w/o knowledge of extent of problems)    Neurological:  Mild tremor hands   Psychiatric: Her speech is normal and behavior is normal. Thought content normal. Her mood appears anxious. Her affect is not blunt, not labile and not inappropriate. Thought content is not paranoid. She does not  exhibit a depressed mood. She expresses no homicidal and no suicidal ideation.  Very tired and somewhat anxious today          Assessment & Plan:   Problem List Items Addressed This Visit      Digestive   GERD    Not helped by omeprazole or zantac  She saw GI (Dr Collene Mares) who is waiting for urology eval of cystocele before she has GI w/u  Since symptoms are keeping her up and her urol appt is in Jan - I adv her to touch base with Dr Collene Mares in the meantime regarding severity of symptoms  In light of this and bloating- offered CT of abd /pelvis and pt declined this         Other   Sleep disorder    Problems with sleep initiation and frequent awakening  Disc sleep hygiene in much detail  She has tried otc products and failed them  Shoulder pain adds to it - tramadol not helping - suggest f/u with her ortho for this (? Needs an injection)  gerd symptoms also add- she is already on omeprazole - needs to check in with /GI as well  For anxiety will try xanax 0.5 mg with great caution (falls and habit) and report back  >25 minutes spent in face to face time with patient, >50% spent in counselling or coordination of care       Left shoulder pain    Keeping her up  Enc pt to f/u with Dr Lynann Bologna for further eval Lu Duffel need injection  Tramadol not helping  Do not feel comfortable giving her narcotic med for this long term issue      Anxiety disorder - Primary    This is adding to sleep problems  Much stress -disc this in detail  Reviewed stressors/ coping techniques/symptoms/ support sources/ tx options and side effects in  detail today  Needs more help at home caring for her son and elderly husband  On lexapro Declines counseling  Will try (with great caution) xanax 0.5 mg at bedtime with fall warning (she keeps walker by the bed)  Disc poss of habit with this as well  She will not use on the nights she cares for her son

## 2016-01-13 NOTE — Progress Notes (Signed)
Pre visit review using our clinic review tool, if applicable. No additional management support is needed unless otherwise documented below in the visit note. 

## 2016-01-13 NOTE — Patient Instructions (Addendum)
Call Dr Lorie Apley office and tell her you are having reflux of mucous at night   When you cannot sleep- read a paper book or play [employer]it[employer] with cards -no screens   Follow up with Dr Lynann Bologna about the shoulder - I cannot give you anything stonger that tramadol   Try the xanax 0.5 mg at bedtime for anxiety to help you sleep  It can increase fall risk- use great caution !  Do not take it when you have to be up with Shanon Brow    Change dose from 75 to 100 mcg daily  Schedule lab in 6 weeks

## 2016-01-14 NOTE — Assessment & Plan Note (Signed)
Problems with sleep initiation and frequent awakening  Disc sleep hygiene in much detail  She has tried otc products and failed them  Shoulder pain adds to it - tramadol not helping - suggest f/u with her ortho for this (? Needs an injection)  gerd symptoms also add- she is already on omeprazole - needs to check in with /GI as well  For anxiety will try xanax 0.5 mg with great caution (falls and habit) and report back  >25 minutes spent in face to face time with patient, >50% spent in counselling or coordination of care

## 2016-01-14 NOTE — Assessment & Plan Note (Signed)
Not helped by omeprazole or zantac  She saw GI (Dr Collene Mares) who is waiting for urology eval of cystocele before she has GI w/u  Since symptoms are keeping her up and her urol appt is in Jan - I adv her to touch base with Dr Collene Mares in the meantime regarding severity of symptoms  In light of this and bloating- offered CT of abd /pelvis and pt declined this

## 2016-01-14 NOTE — Assessment & Plan Note (Signed)
Keeping her up  Enc pt to f/u with Dr Lynann Bologna for further eval Lu Duffel need injection  Tramadol not helping  Do not feel comfortable giving her narcotic med for this long term issue

## 2016-01-14 NOTE — Assessment & Plan Note (Signed)
This is adding to sleep problems  Much stress -disc this in detail  Reviewed stressors/ coping techniques/symptoms/ support sources/ tx options and side effects in detail today  Needs more help at home caring for her son and elderly husband  On lexapro Declines counseling  Will try (with great caution) xanax 0.5 mg at bedtime with fall warning (she keeps walker by the bed)  Disc poss of habit with this as well  She will not use on the nights she cares for her son

## 2016-01-25 ENCOUNTER — Other Ambulatory Visit: Payer: Self-pay | Admitting: Orthopedic Surgery

## 2016-01-25 DIAGNOSIS — M75100 Unspecified rotator cuff tear or rupture of unspecified shoulder, not specified as traumatic: Secondary | ICD-10-CM

## 2016-01-26 ENCOUNTER — Ambulatory Visit
Admission: RE | Admit: 2016-01-26 | Discharge: 2016-01-26 | Disposition: A | Discharge status: Home / Self Care | Payer: Medicare Other | Source: Ambulatory Visit | Attending: Orthopedic Surgery | Admitting: Orthopedic Surgery

## 2016-01-26 DIAGNOSIS — M75100 Unspecified rotator cuff tear or rupture of unspecified shoulder, not specified as traumatic: Secondary | ICD-10-CM

## 2016-02-10 DIAGNOSIS — N952 Postmenopausal atrophic vaginitis: Secondary | ICD-10-CM | POA: Diagnosis not present

## 2016-02-10 DIAGNOSIS — R1030 Lower abdominal pain, unspecified: Secondary | ICD-10-CM | POA: Diagnosis not present

## 2016-02-10 DIAGNOSIS — K5902 Outlet dysfunction constipation: Secondary | ICD-10-CM | POA: Diagnosis not present

## 2016-02-10 DIAGNOSIS — N390 Urinary tract infection, site not specified: Secondary | ICD-10-CM | POA: Diagnosis not present

## 2016-02-10 DIAGNOSIS — N816 Rectocele: Secondary | ICD-10-CM | POA: Diagnosis not present

## 2016-02-26 ENCOUNTER — Other Ambulatory Visit: Payer: Self-pay | Admitting: Obstetrics and Gynecology

## 2016-02-26 DIAGNOSIS — R634 Abnormal weight loss: Secondary | ICD-10-CM

## 2016-02-26 DIAGNOSIS — R1030 Lower abdominal pain, unspecified: Secondary | ICD-10-CM

## 2016-03-02 ENCOUNTER — Telehealth: Payer: Self-pay

## 2016-03-02 DIAGNOSIS — K469 Unspecified abdominal hernia without obstruction or gangrene: Secondary | ICD-10-CM | POA: Diagnosis not present

## 2016-03-02 DIAGNOSIS — N816 Rectocele: Secondary | ICD-10-CM | POA: Diagnosis not present

## 2016-03-02 DIAGNOSIS — I10 Essential (primary) hypertension: Secondary | ICD-10-CM | POA: Diagnosis not present

## 2016-03-02 MED ORDER — AMLODIPINE BESYLATE 5 MG PO TABS: 5.0000 mg | ORAL_TABLET | Freq: Every day | ORAL | 11 refills | Status: DC

## 2016-03-02 NOTE — Telephone Encounter (Signed)
Pt notified of Dr. Marliss Coots comments and verbalized understanding. Rx sent to pharmacy and f/u with pt scheduled for Monday 03/07/16

## 2016-03-02 NOTE — Telephone Encounter (Signed)
See prev note, Pt was seen today at urologist (under care everywhere)

## 2016-03-02 NOTE — Telephone Encounter (Signed)
Her last 3 bp here were as follows BP Readings from Last 3 Encounters:  01/13/16 134/68  12/30/15 (!) 146/72  12/23/15 (!) 146/80   Her bp tends to be higher when anxious/stressedor in pain (she often is)  She does have moderate aortic stenosis and also cannot take ace inhibitors  I know she wants to avoid diuretics in light of urinary problems for now   I would like to try amlodipine cautiously  Please call in amlodipine 5 mg 1 po QD #[phone] ref  She can start it this evening and take in the pm  If any side effects or bp below 90/50 please alert me  Please put her on the schedule sometime Monday to give it a chance to work and we will go from there  I will do the best I can to get it controlled before her surgery

## 2016-03-02 NOTE — Telephone Encounter (Signed)
Pt states she needs to have surgery ASAP it can't wait until next week, they won't do her surgery until you approve her BP. She would like to come see Dr. Glori Bickers.

## 2016-03-04 ENCOUNTER — Encounter (INDEPENDENT_AMBULATORY_CARE_PROVIDER_SITE_OTHER): Payer: Self-pay

## 2016-03-04 ENCOUNTER — Ambulatory Visit
Admission: RE | Admit: 2016-03-04 | Discharge: 2016-03-04 | Disposition: A | Discharge status: Home / Self Care | Payer: Medicare Other | Source: Ambulatory Visit | Attending: Obstetrics and Gynecology | Admitting: Obstetrics and Gynecology

## 2016-03-04 DIAGNOSIS — R1030 Lower abdominal pain, unspecified: Secondary | ICD-10-CM

## 2016-03-04 DIAGNOSIS — K5730 Diverticulosis of large intestine without perforation or abscess without bleeding: Secondary | ICD-10-CM | POA: Diagnosis not present

## 2016-03-04 DIAGNOSIS — R634 Abnormal weight loss: Secondary | ICD-10-CM

## 2016-03-04 MED ORDER — IOPAMIDOL (ISOVUE-300) INJECTION 61%
100.0000 mL | Freq: Once | INTRAVENOUS | Status: AC | PRN
  Administered 2016-03-04: 100 mL via INTRAVENOUS

## 2016-03-07 ENCOUNTER — Ambulatory Visit (INDEPENDENT_AMBULATORY_CARE_PROVIDER_SITE_OTHER): Payer: Medicare HMO | Admitting: Family Medicine

## 2016-03-07 ENCOUNTER — Encounter: Payer: Self-pay | Admitting: Family Medicine

## 2016-03-07 VITALS — BP 130/60 | HR 78 | Temp 98.6°F | Ht 61.5 in | Wt 137.0 lb

## 2016-03-07 DIAGNOSIS — N8110 Cystocele, unspecified: Secondary | ICD-10-CM

## 2016-03-07 DIAGNOSIS — I350 Nonrheumatic aortic (valve) stenosis: Secondary | ICD-10-CM | POA: Diagnosis not present

## 2016-03-07 DIAGNOSIS — I10 Essential (primary) hypertension: Secondary | ICD-10-CM

## 2016-03-07 NOTE — Patient Instructions (Addendum)
Your blood pressure is improved  130/60 today  Take your amlodipine 5 mg daily  If side effects occur please let me know  Good luck with your surgery!

## 2016-03-07 NOTE — Assessment & Plan Note (Signed)
Pt is to have repair on 2/1

## 2016-03-07 NOTE — Progress Notes (Signed)
Pre visit review using our clinic review tool, if applicable. No additional management support is needed unless otherwise documented below in the visit note. 

## 2016-03-07 NOTE — Assessment & Plan Note (Addendum)
Improved with 2.5 mg of amlodipine- inst to go ahead and take 5 mg daily  BP: 130/60   No side effects  She thinks anxiety does raise bp a bit also She has surgery planned for 2/1  Known AS- has had w/u in hte past and no symptoms  Will keep [place] updated

## 2016-03-07 NOTE — Progress Notes (Signed)
Subjective:    Patient ID: Dawn Aguirre, female    DOB: 05-22-1925, 83 y.o.   MRN: [id number]  HPI Here for HTN   Pt is scheduled for urology surgery and bp was up in their office   It is scheduled for feb 1   Wt Readings from Last 3 Encounters:  03/07/16 137 lb (62.1 kg)  01/13/16 142 lb 4 oz (64.5 kg)  12/30/15 142 lb 8 oz (64.6 kg)   BP Readings from Last 3 Encounters:  03/07/16 (!) 150/72  01/13/16 134/68  12/30/15 (!) 146/72   We called in amlodipine 5 mg to take daily  Unfortunately she forgot it today   bp at home have been good   (wrist cuff)  137/57 this am  All readings below [id number] systolic and 90 diastolic    She cannot take ace inhibitors  Pulse Readings from Last 3 Encounters:  03/07/16 78  01/13/16 75  12/30/15 73   Has a hx of aortic stenosis Has been w/u by cardiology   Patient Active Problem List   Diagnosis Date Noted  . Sleep disorder 01/13/2016  . Female cystocele 12/30/2015  . Left shoulder pain 09/02/2015  . Neck pain 09/02/2015  . Allergic dermatitis 08/05/2015  . Routine general medical examination at a health care facility 07/22/2015  . Hearing loss 07/22/2015  . Hyperglycemia 07/22/2015  . Anxiety disorder 04/24/2015  . Abdominal bloating 03/06/2015  . Tremor 12/20/2014  . Chronic pain 10/14/2014  . B12 deficiency 07/16/2014  . Vitamin D deficiency 07/16/2014  . Fatigue 07/04/2014  . Encounter for Medicare annual wellness exam 01/13/2014  . Fall in home 12/24/2013  . History of breast cancer 12/18/2013  . Epiploic appendagitis 02/22/2013  . Dry mouth 02/04/2013  . Chest wall pain 12/25/2012  . Benign paroxysmal positional vertigo 09/14/2012  . Low back pain 09/07/2012  . Constipation 11/04/2011  . Aortic stenosis 08/31/2010  . Hyperlipidemia   . STRESS REACTION, ACUTE, WITH EMOTIONAL DISTURBANCE 10/12/2007  . [place] DISEASE, CERVICAL 10/12/2007  . Hypothyroidism 01/22/2007  . RAYNAUD'S SYNDROME 01/22/2007  . EXTERNAL  HEMORRHOIDS 01/22/2007  . DIVERTICULOSIS, COLON 01/22/2007  . FIBROCYSTIC BREAST DISEASE 01/22/2007  . Osteoporosis 01/22/2007  . Essential hypertension 12/16/2006  . GERD 12/16/2006  . DYSPNEA ON EXERTION 12/16/2006  . Cough 12/16/2006  . TOBACCO ABUSE, HX OF 12/16/2006  . HYSTERECTOMY, HX OF 12/16/2006   Past Medical History:  Diagnosis Date  . Acquired absence of organ, genital organs   . Breast cancer ([place])    surgery at [facility]  . Chronic airway obstruction, not elsewhere classified   . Colitis, ischemic ([place]) 07/2000   ? infect  . Cough   . Degeneration of cervical intervertebral disc   . Diffuse cystic mastopathy   . Diverticulosis of colon (without mention of hemorrhage)   . Esophageal reflux   . External hemorrhoids without mention of complication   . Fibula fracture 2006  . Flatulence, eructation, and gas pain   . Hyperlipidemia   . Lichen sclerosus    back/abd/vulvar  . Mastoiditis of right side 04/2005   admitted  . Osteoporosis, unspecified   . Other acute sinusitis   . Other and unspecified hyperlipidemia   . Other dyspnea and respiratory abnormality   . Personal history of tobacco use, presenting hazards to health   . Predominant disturbance of emotions   . Raynaud's syndrome   . Rheumatoid arthritis(714.0)    Sero-negative  . Spinal stenosis   .  Unspecified essential hypertension   . Unspecified gastritis and gastroduodenitis without mention of hemorrhage   . Unspecified hypothyroidism   . Wears dentures    top   Past Surgical History:  Procedure Laterality Date  . ABDOMINAL HYSTERECTOMY  1968  . Cardiollite  02/2002   Neg  . COLONOSCOPY    . DEXA  2001   OP,heel  . DEXA     OP hip T-2.94  . NASAL SINUS SURGERY  05/2002  . Nuclear Stress Test  11/07   Normal  . TONSILLECTOMY    . US ECHOCARDIOGRAPHY  10/06   normal LVF EF 55-65%   Social History  Substance Use Topics  . Smoking status: Former Smoker    Quit date: 02/07/1962  . Smokeless  tobacco: Never Used  . Alcohol use 0.0 oz/week     Comment: occ wine   Family History  Problem Relation Age of Onset  . Heart attack Father 48  . Coronary artery disease Sister 74    CABG  . Heart attack Brother 45  . Coronary artery disease      GF   Allergies  Allergen Reactions  . Ace Inhibitors     REACTION: cough  . Ezetimibe     REACTION: myalgia  . Ezetimibe-Simvastatin     REACTION: joints swell  . Macrobid [Nitrofurantoin]     ABD PAIN, ALSO BODY ACHES/PAIN  . Moxifloxacin   . Rosuvastatin     REACTION: myalgia  . Septra [Sulfamethoxazole-Trimethoprim]     Sick feeling   . Simvastatin     REACTION: myalgia  . Triaminic    Current Outpatient Prescriptions on File Prior to Visit  Medication Sig Dispense Refill  . ALPRAZolam (XANAX) 0.5 MG tablet Take 1 tablet (0.5 mg total) by mouth at bedtime as needed for anxiety. 30 tablet 1  . amLODipine (NORVASC) 5 MG tablet Take 1 tablet (5 mg total) by mouth daily. 30 tablet 11  . benzonatate (TESSALON) 200 MG capsule Take 1 capsule (200 mg total) by mouth 3 (three) times daily as needed for cough. 30 capsule 0  . Cholecalciferol (VITAMIN D) 2000 units tablet Take 2,000 Units by mouth daily.    . cyanocobalamin (,VITAMIN B-12,) 1000 MCG/ML injection Inject 1,000 mcg into the muscle every 6 (six) weeks.    Marland Kitchen escitalopram (LEXAPRO) 20 MG tablet Take 1 tablet (20 mg total) by mouth daily. 30 tablet 11  . guaiFENesin (MUCINEX) 600 MG 12 hr tablet Take 1 tablet (600 mg total) by mouth every morning.    Marland Kitchen levothyroxine (SYNTHROID, LEVOTHROID) 100 MCG tablet Take 1 tablet (100 mcg total) by mouth daily. 90 tablet 3  . levothyroxine (SYNTHROID, LEVOTHROID) 75 MCG tablet Take 1 tablet (75 mcg total) by mouth daily before breakfast. 30 tablet 3  . omeprazole (PRILOSEC) 20 MG capsule Take 1 capsule (20 mg total) by mouth daily. 30 capsule 3  . Polyethylene Glycol 3350 (MIRALAX PO) Take by mouth.    . Probiotic Product (ALIGN PO) Take 1  tablet by mouth daily.    . traMADol (ULTRAM) 50 MG tablet TAKE 1 TO 2 TABLETS BY MOUTH UP TO 3 TIMES A DAY AS NEEDED FOR PAIN 90 tablet 0  . triamcinolone cream (KENALOG) 0.1 % Apply 1 application topically 2 (two) times daily. 30 g 0   No current facility-administered medications on file prior to visit.     Review of Systems Review of Systems  Constitutional: Negative for fever, appetite change, fatigue and  unexpected weight change.  Eyes: Negative for pain and visual disturbance.  Respiratory: Negative for cough and shortness of breath.   Cardiovascular: Negative for cp or palpitations    Gastrointestinal: Negative for nausea, diarrhea and pos for bloating and constipation.  Genitourinary: Negative for urgency and frequency. pos for incontinence and symptomatic cystocele  Skin: Negative for pallor or rash   Neurological: Negative for weakness, light-headedness, numbness and headaches.  Hematological: Negative for adenopathy. Does not bruise/bleed easily.  Psychiatric/Behavioral: Negative for dysphoric mood. The patient is nervous/anxious.         Objective:   Physical Exam  Constitutional: She appears well-developed and well-nourished. No distress.  Well but fatigued appearing   HENT:  Head: Normocephalic and atraumatic.  Mouth/Throat: Oropharynx is clear and moist.  Eyes: Conjunctivae and EOM are normal. Pupils are equal, round, and reactive to light.  Neck: Normal range of motion. Neck supple. No JVD present. Carotid bruit is not present. No thyromegaly present.  Cardiovascular: Normal rate, regular rhythm and intact distal pulses.  Exam reveals no gallop.   Murmur heard. Baseline systolic M of AS  Pulmonary/Chest: Effort normal and breath sounds normal. No respiratory distress. She has no wheezes. She has no rales.  No crackles  Abdominal: Soft. Bowel sounds are normal. She exhibits no distension, no abdominal bruit and no mass. There is no tenderness.  Musculoskeletal: She  exhibits no edema.  Lymphadenopathy:    She has no cervical adenopathy.  Neurological: She is alert. She has normal reflexes.  Skin: Skin is warm and dry. No rash noted.  Psychiatric: Her mood appears anxious.          Assessment & Plan:   Problem List Items Addressed This Visit      Cardiovascular and Mediastinum   Aortic stenosis    No symptoms  Cardiac w/u in the past  Controlling bp with amlodipine      Essential hypertension - Primary    Improved with 2.5 mg of amlodipine- inst to go ahead and take 5 mg daily  BP: 130/60   No side effects  She thinks anxiety does raise bp a bit also She has surgery planned for 2/1  Known AS- has had w/u in hte past and no symptoms  Will keep [place] updated          Genitourinary   Female cystocele    Pt is to have repair on 2/1

## 2016-03-07 NOTE — Assessment & Plan Note (Signed)
No symptoms  Cardiac w/u in the past  Controlling bp with amlodipine

## 2016-03-09 DIAGNOSIS — K469 Unspecified abdominal hernia without obstruction or gangrene: Secondary | ICD-10-CM | POA: Diagnosis not present

## 2016-03-10 DIAGNOSIS — K5902 Outlet dysfunction constipation: Secondary | ICD-10-CM | POA: Diagnosis not present

## 2016-03-10 DIAGNOSIS — Z79899 Other long term (current) drug therapy: Secondary | ICD-10-CM | POA: Diagnosis not present

## 2016-03-10 DIAGNOSIS — Z23 Encounter for immunization: Secondary | ICD-10-CM | POA: Diagnosis not present

## 2016-03-10 DIAGNOSIS — N816 Rectocele: Secondary | ICD-10-CM | POA: Diagnosis not present

## 2016-03-10 DIAGNOSIS — J449 Chronic obstructive pulmonary disease, unspecified: Secondary | ICD-10-CM | POA: Diagnosis not present

## 2016-03-10 DIAGNOSIS — N815 Vaginal enterocele: Secondary | ICD-10-CM | POA: Diagnosis not present

## 2016-03-10 DIAGNOSIS — Z87891 Personal history of nicotine dependence: Secondary | ICD-10-CM | POA: Diagnosis not present

## 2016-03-10 DIAGNOSIS — I10 Essential (primary) hypertension: Secondary | ICD-10-CM | POA: Diagnosis not present

## 2016-04-04 DIAGNOSIS — M7502 Adhesive capsulitis of left shoulder: Secondary | ICD-10-CM | POA: Diagnosis not present

## 2016-04-25 DIAGNOSIS — J209 Acute bronchitis, unspecified: Secondary | ICD-10-CM | POA: Diagnosis not present

## 2016-04-25 DIAGNOSIS — R05 Cough: Secondary | ICD-10-CM | POA: Diagnosis not present

## 2016-04-29 ENCOUNTER — Encounter (INDEPENDENT_AMBULATORY_CARE_PROVIDER_SITE_OTHER): Payer: Self-pay

## 2016-04-29 ENCOUNTER — Encounter: Payer: Self-pay | Admitting: Family Medicine

## 2016-04-29 ENCOUNTER — Ambulatory Visit (INDEPENDENT_AMBULATORY_CARE_PROVIDER_SITE_OTHER): Payer: Medicare HMO | Admitting: Family Medicine

## 2016-04-29 VITALS — BP 150/70 | HR 77 | Temp 98.7°F | Resp 18 | Ht 61.5 in | Wt 137.0 lb

## 2016-04-29 DIAGNOSIS — J209 Acute bronchitis, unspecified: Secondary | ICD-10-CM | POA: Insufficient documentation

## 2016-04-29 MED ORDER — HYDROCODONE-HOMATROPINE 5-1.5 MG/5ML PO SYRP: 5.0000 mL | ORAL_SOLUTION | Freq: Three times a day (TID) | ORAL | 0 refills | Status: DC | PRN

## 2016-04-29 MED ORDER — AZITHROMYCIN 250 MG PO TABS: ORAL_TABLET | ORAL | 0 refills | Status: DC

## 2016-04-29 MED ORDER — BENZONATATE 200 MG PO CAPS: 200.0000 mg | ORAL_CAPSULE | Freq: Three times a day (TID) | ORAL | 1 refills | Status: DC | PRN

## 2016-04-29 NOTE — Patient Instructions (Signed)
Drink lots of fluids and rest as much as you can  Take the zpak for bronchitis as directed Tessalon for cough  Hycodan for cough at night- with caution of sedation and constipation   Get help so you can get more rest   Update if not starting to improve in a week or if worsening

## 2016-04-29 NOTE — Progress Notes (Signed)
Subjective:    Patient ID: Dawn Aguirre, female    DOB: October 01, 1925, 83 y.o.   MRN: [id number]  HPI  Here for acute cough   Husband started with it last week  She had to take him to the ED last week   Horrible cough keeping her awake  Hoarse  Very tired  All in her chest and throat  Purulent sputum  She used an old hydrocodone cough med last night - helped a little bit   Has to work harder to drink fluids So much to deal with    Wt Readings from Last 3 Encounters:  04/29/16 137 lb (62.1 kg)  03/07/16 137 lb (62.1 kg)  01/13/16 142 lb 4 oz (64.5 kg)   Temp: 98.7 F (37.1 C)   Has been in the hospital with her son (he was in ICU with complex problems) Under tremendous stress    Patient Active Problem List   Diagnosis Date Noted  . Acute bronchitis 04/29/2016  . Sleep disorder 01/13/2016  . Female cystocele 12/30/2015  . Left shoulder pain 09/02/2015  . Neck pain 09/02/2015  . Allergic dermatitis 08/05/2015  . Routine general medical examination at a health care facility 07/22/2015  . Hearing loss 07/22/2015  . Hyperglycemia 07/22/2015  . Anxiety disorder 04/24/2015  . Abdominal bloating 03/06/2015  . Tremor 12/20/2014  . Chronic pain 10/14/2014  . B12 deficiency 07/16/2014  . Vitamin D deficiency 07/16/2014  . Fatigue 07/04/2014  . Encounter for Medicare annual wellness exam 01/13/2014  . Fall in home 12/24/2013  . History of breast cancer 12/18/2013  . Epiploic appendagitis 02/22/2013  . Dry mouth 02/04/2013  . Chest wall pain 12/25/2012  . Benign paroxysmal positional vertigo 09/14/2012  . Low back pain 09/07/2012  . Constipation 11/04/2011  . Aortic stenosis 08/31/2010  . Hyperlipidemia   . STRESS REACTION, ACUTE, WITH EMOTIONAL DISTURBANCE 10/12/2007  . [place] DISEASE, CERVICAL 10/12/2007  . Hypothyroidism 01/22/2007  . RAYNAUD'S SYNDROME 01/22/2007  . EXTERNAL HEMORRHOIDS 01/22/2007  . DIVERTICULOSIS, COLON 01/22/2007  . FIBROCYSTIC BREAST  DISEASE 01/22/2007  . Osteoporosis 01/22/2007  . Essential hypertension 12/16/2006  . GERD 12/16/2006  . DYSPNEA ON EXERTION 12/16/2006  . Cough 12/16/2006  . TOBACCO ABUSE, HX OF 12/16/2006  . HYSTERECTOMY, HX OF 12/16/2006   Past Medical History:  Diagnosis Date  . Acquired absence of organ, genital organs   . Breast cancer ([place])    surgery at [facility]  . Chronic airway obstruction, not elsewhere classified   . Colitis, ischemic ([place]) 07/2000   ? infect  . Cough   . Degeneration of cervical intervertebral disc   . Diffuse cystic mastopathy   . Diverticulosis of colon (without mention of hemorrhage)   . Esophageal reflux   . External hemorrhoids without mention of complication   . Fibula fracture 2006  . Flatulence, eructation, and gas pain   . Hyperlipidemia   . Lichen sclerosus    back/abd/vulvar  . Mastoiditis of right side 04/2005   admitted  . Osteoporosis, unspecified   . Other acute sinusitis   . Other and unspecified hyperlipidemia   . Other dyspnea and respiratory abnormality   . Personal history of tobacco use, presenting hazards to health   . Predominant disturbance of emotions   . Raynaud's syndrome   . Rheumatoid arthritis(714.0)    Sero-negative  . Spinal stenosis   . Unspecified essential hypertension   . Unspecified gastritis and gastroduodenitis without mention of hemorrhage   .  Unspecified hypothyroidism   . Wears dentures    top   Past Surgical History:  Procedure Laterality Date  . ABDOMINAL HYSTERECTOMY  1968  . Cardiollite  02/2002   Neg  . COLONOSCOPY    . DEXA  2001   OP,heel  . DEXA     OP hip T-2.94  . NASAL SINUS SURGERY  05/2002  . Nuclear Stress Test  11/07   Normal  . TONSILLECTOMY    . US ECHOCARDIOGRAPHY  10/06   normal LVF EF 55-65%   Social History  Substance Use Topics  . Smoking status: Former Smoker    Quit date: 02/07/1962  . Smokeless tobacco: Never Used  . Alcohol use 0.0 oz/week     Comment: occ wine   Family  History  Problem Relation Age of Onset  . Heart attack Father 56  . Coronary artery disease Sister 9    CABG  . Heart attack Brother 52  . Coronary artery disease      GF   Allergies  Allergen Reactions  . Ace Inhibitors     REACTION: cough  . Ezetimibe     REACTION: myalgia  . Ezetimibe-Simvastatin     REACTION: joints swell  . Macrobid [Nitrofurantoin]     ABD PAIN, ALSO BODY ACHES/PAIN  . Moxifloxacin   . Rosuvastatin     REACTION: myalgia  . Septra [Sulfamethoxazole-Trimethoprim]     Sick feeling   . Simvastatin     REACTION: myalgia  . Triaminic    Current Outpatient Prescriptions on File Prior to Visit  Medication Sig Dispense Refill  . Cholecalciferol (VITAMIN D) 2000 units tablet Take 2,000 Units by mouth daily.    . cyanocobalamin (,VITAMIN B-12,) 1000 MCG/ML injection Inject 1,000 mcg into the muscle every 6 (six) weeks.    Marland Kitchen levothyroxine (SYNTHROID, LEVOTHROID) 100 MCG tablet Take 1 tablet (100 mcg total) by mouth daily. 90 tablet 3  . Polyethylene Glycol 3350 (MIRALAX PO) Take by mouth.    . traMADol (ULTRAM) 50 MG tablet TAKE 1 TO 2 TABLETS BY MOUTH UP TO 3 TIMES A DAY AS NEEDED FOR PAIN 90 tablet 0  . triamcinolone cream (KENALOG) 0.1 % Apply 1 application topically 2 (two) times daily. 30 g 0   No current facility-administered medications on file prior to visit.     Review of Systems  Constitutional: Positive for appetite change and fatigue. Negative for fever.  HENT: Positive for postnasal drip, rhinorrhea, sinus pressure and sore throat. Negative for congestion, ear pain and sneezing.   Eyes: Negative for pain and discharge.  Respiratory: Positive for cough. Negative for shortness of breath, wheezing and stridor.   Cardiovascular: Negative for chest pain.  Gastrointestinal: Negative for diarrhea, nausea and vomiting.  Genitourinary: Negative for frequency, hematuria and urgency.  Musculoskeletal: Negative for arthralgias and myalgias.  Skin: Negative  for rash.  Neurological: Positive for headaches. Negative for dizziness, weakness and light-headedness.  Psychiatric/Behavioral: Negative for confusion and dysphoric mood.       Objective:   Physical Exam  Constitutional: She appears well-developed and well-nourished. No distress.  Appears fatigued but well   HENT:  Head: Normocephalic and atraumatic.  Right Ear: External ear normal.  Left Ear: External ear normal.  Mouth/Throat: Oropharynx is clear and moist.  Nares are injected and congested  No sinus tenderness Clear rhinorrhea and post nasal drip   Eyes: Conjunctivae and EOM are normal. Pupils are equal, round, and reactive to light. Right eye exhibits no  discharge. Left eye exhibits no discharge.  Neck: Normal range of motion. Neck supple.  Cardiovascular: Normal rate and normal heart sounds.   Pulmonary/Chest: Effort normal and breath sounds normal. No respiratory distress. She has no wheezes. She has no rales. She exhibits no tenderness.  Harsh bs with scattered rhonchi  No rales or wheezes Good air exch  Some upper airway sounds cleared by cough  Lymphadenopathy:    She has no cervical adenopathy.  Neurological: She is alert.  Skin: Skin is warm and dry. No rash noted.  Psychiatric: She has a normal mood and affect.          Assessment & Plan:   Problem List Items Addressed This Visit      Respiratory   Acute bronchitis    Given recent hospital exposure will cover with zpak Reassuring exam  Hycodan for pm cough with caution of sedation and constipation  Tessalon tid for cough  Fluids and rest  Update if not starting to improve in a week or if worsening

## 2016-04-29 NOTE — Progress Notes (Signed)
Pre visit review using our clinic review tool, if applicable. No additional management support is needed unless otherwise documented below in the visit note. 

## 2016-04-30 NOTE — Assessment & Plan Note (Signed)
Given recent hospital exposure will cover with zpak Reassuring exam  Hycodan for pm cough with caution of sedation and constipation  Tessalon tid for cough  Fluids and rest  Update if not starting to improve in a week or if worsening

## 2016-05-05 ENCOUNTER — Telehealth: Payer: Self-pay | Admitting: *Deleted

## 2016-05-05 NOTE — Telephone Encounter (Signed)
Spoke to pt who states she was seen on 3/23 for a cold and her s/s have not improved. She was advised to contact office back with an update and wanted to know if she can be sent in a new medication or if she will need to be seen

## 2016-05-05 NOTE — Telephone Encounter (Signed)
Spoken and notified patient of Dr Marliss Coots comments. Patient stated that patient does not have a fever, a little headache, and coughing some but still have some cough syrup. No wheezing. If feels worse patient will call office.

## 2016-05-05 NOTE — Telephone Encounter (Signed)
It what she has is viral - it may take several weeks or more to get better - but if she is getting worse, that is more concerning  Fever? Headache? How bad is the cough/phlegm and what color? Any wheezing ?

## 2016-05-05 NOTE — Telephone Encounter (Signed)
Please check in on her on Monday , thanks

## 2016-05-09 NOTE — Telephone Encounter (Signed)
Pt said she isn't feeling any better, still has very "deep" bad productive cough, pt said she has bad congestion, no fever, but she is very fatigued, pt is running low on cough meds, she only a small amount of hycodan left and a few tessalon pills but she is struggling with this lingering production cough

## 2016-05-09 NOTE — Telephone Encounter (Signed)
Please get her in with first available for re eval  Please refill her tessalon times one if she wants it -I cannot refill the hycodan since I am out of town Thanks

## 2016-05-10 NOTE — Telephone Encounter (Signed)
Per pt she doesn't want to see anyone else she is okay not getting the hycodan refilled, pt also declined to get a refill of the tessalon because she said they don't work really well, she said she will just take OTC meds and then she wanted to f/u with Dr. Glori Bickers when she returns (didn't want to see any other provider this week), appt scheduled for 05/16/16 (30 min)

## 2016-05-10 NOTE — Telephone Encounter (Signed)
I will see her then but if she worsens I want her to see another provider  Thanks

## 2016-05-11 NOTE — Telephone Encounter (Signed)
Husband reports pt is running errands and will be back appx noon

## 2016-05-16 ENCOUNTER — Encounter: Payer: Self-pay | Admitting: Family Medicine

## 2016-05-16 ENCOUNTER — Encounter (INDEPENDENT_AMBULATORY_CARE_PROVIDER_SITE_OTHER): Payer: Self-pay

## 2016-05-16 ENCOUNTER — Ambulatory Visit (INDEPENDENT_AMBULATORY_CARE_PROVIDER_SITE_OTHER): Payer: Medicare HMO | Admitting: Family Medicine

## 2016-05-16 VITALS — BP 136/68 | HR 50 | Temp 98.4°F | Ht 61.5 in | Wt 135.0 lb

## 2016-05-16 DIAGNOSIS — R4589 Other symptoms and signs involving emotional state: Secondary | ICD-10-CM

## 2016-05-16 DIAGNOSIS — G479 Sleep disorder, unspecified: Secondary | ICD-10-CM | POA: Diagnosis not present

## 2016-05-16 DIAGNOSIS — I10 Essential (primary) hypertension: Secondary | ICD-10-CM

## 2016-05-16 DIAGNOSIS — R251 Tremor, unspecified: Secondary | ICD-10-CM | POA: Diagnosis not present

## 2016-05-16 DIAGNOSIS — E039 Hypothyroidism, unspecified: Secondary | ICD-10-CM

## 2016-05-16 DIAGNOSIS — R69 Illness, unspecified: Secondary | ICD-10-CM | POA: Diagnosis not present

## 2016-05-16 MED ORDER — BUSPIRONE HCL 15 MG PO TABS: 15.0000 mg | ORAL_TABLET | Freq: Two times a day (BID) | ORAL | 11 refills | Status: DC

## 2016-05-16 NOTE — Assessment & Plan Note (Signed)
Overdue for TSH- changed dose in dec  Lab Results  Component Value Date   TSH 9.87 (H) 01/12/2016    On 100 mcg daily- says she is compliant  Lab today  She is feeling more shaky and nervous Has wt loss but also less appetite

## 2016-05-16 NOTE — Progress Notes (Signed)
Pre visit review using our clinic review tool, if applicable. No additional management support is needed unless otherwise documented below in the visit note. 

## 2016-05-16 NOTE — Assessment & Plan Note (Signed)
bp in fair control at this time  BP Readings from Last 1 Encounters:  05/16/16 136/68   No changes needed Disc lifstyle change with low sodium diet and exercise

## 2016-05-16 NOTE — Assessment & Plan Note (Signed)
Reviewed stressors/ coping techniques/symptoms/ support sources/ tx options and side effects in detail today Anxiety symptoms are starting to interfere with her daily fxn and care giving Long disc re caregiver stress (son and husband)  Stressors are starting to level out and she is getting help Disc opt for tx -she declines counseling  Will try buspar 7.5 bid  Discussed expectations of SSRI medication including time to effectiveness and mechanism of action, also poss of side effects (early and late)- including mental fuzziness, weight or appetite change, nausea and poss of worse dep or anxiety (even suicidal thoughts)  Pt voiced understanding and will stop med and update if this occurs   Will also watch for hyponatremia due to her age  >25 minutes spent in face to face time with patient, >50% spent in counselling or coordination of care

## 2016-05-16 NOTE — Assessment & Plan Note (Signed)
Anxiety is worsening this- hope that tx with buspar helps along with dec in stressors

## 2016-05-16 NOTE — Patient Instructions (Addendum)
Start buspar 1/2 pill twice daily for anxiety - stay on it on a schedule  If any intolerable side effects or if you feel worse (more anxious or depressed) then stop it and let me know Take time to rest - have Felicia give you time out frequently and have Lynann Bologna help as much as you can  Getting some sleep would be good  Let's check your thyroid today- you are overdue for a check  Make sure you are getting 64 oz of fluids per day  Eat snacks with protein even if you are not hungry (ensure is fine) If you want to see a counselor please let us know   Think about starting meditation again- it is helpful also   Use your walker when you feel unsteady   Follow up in 4-6 weeks

## 2016-05-16 NOTE — Progress Notes (Signed)
Subjective:    Patient ID: Dawn Aguirre, female    DOB: [birth year], 83 y.o.   MRN: [id number]  HPI Here for multiple health issues   Was last seen with bronchitis- called in the interim with bad cough (I was out of town and she refused to see other providers) Cough continued - but finally getting better now (lingering but slowly getting better)  Generally run down and not much appetite   Wt Readings from Last 3 Encounters:  05/16/16 135 lb (61.2 kg)  04/29/16 137 lb (62.1 kg)  03/07/16 137 lb (62.1 kg)   BP Readings from Last 3 Encounters:  05/16/16 136/68  04/29/16 (!) 150/70  03/07/16 130/60    States she feels very shaky - tremor is much worse with fatigue and anxiety  Under a lot of pressure now (caring for her husband and son)  Stress and anxiety -she is interested in medication for anxiety  Was in "crisis" this weekend removing son from a nursing home but thinks she is handling it ok - she still feels in control   Anxiety is keeping her awake   She has stopped driving for the most part   Lab Results  Component Value Date   WBC 6.2 07/21/2015   HGB 11.9 (L) 07/21/2015   HCT 36.2 07/21/2015   MCV 90.3 07/21/2015   PLT 162.0 07/21/2015   Lab Results  Component Value Date   TSH 9.87 (H) 01/12/2016   she is taking the 100 mcg now -overdue for labs   Lab Results  Component Value Date   VITAMINB12 350 09/02/2015      Chemistry      Component Value Date/Time   NA 140 07/21/2015 1012   K 3.8 07/21/2015 1012   CL 105 07/21/2015 1012   CO2 26 07/21/2015 1012   BUN 18 07/21/2015 1012   CREATININE 1.12 07/21/2015 1012      Component Value Date/Time   CALCIUM 9.2 07/21/2015 1012   ALKPHOS 54 07/21/2015 1012   AST 21 07/21/2015 1012   ALT 17 07/21/2015 1012   BILITOT 0.6 07/21/2015 1012       She had to pull her son out of inpatient rehab (unhappy with his care)  He is starting to do a lot better at home  His caregiver is there  Better and he is  sleeping now    Patient Active Problem List   Diagnosis Date Noted  . Sleep disorder 01/13/2016  . Female cystocele 12/30/2015  . Left shoulder pain 09/02/2015  . Neck pain 09/02/2015  . Allergic dermatitis 08/05/2015  . Routine general medical examination at a health care facility 07/22/2015  . Hearing loss 07/22/2015  . Hyperglycemia 07/22/2015  . Anxiety disorder 04/24/2015  . Abdominal bloating 03/06/2015  . Tremor 12/20/2014  . Chronic pain 10/14/2014  . B12 deficiency 07/16/2014  . Vitamin D deficiency 07/16/2014  . Fatigue 07/04/2014  . Encounter for Medicare annual wellness exam 01/13/2014  . Fall in home 12/24/2013  . History of breast cancer 12/18/2013  . Epiploic appendagitis 02/22/2013  . Dry mouth 02/04/2013  . Chest wall pain 12/25/2012  . Benign paroxysmal positional vertigo 09/14/2012  . Low back pain 09/07/2012  . Constipation 11/04/2011  . Aortic stenosis 08/31/2010  . Hyperlipidemia   . STRESS REACTION, ACUTE, WITH EMOTIONAL DISTURBANCE 10/12/2007  . [place] DISEASE, CERVICAL 10/12/2007  . Hypothyroidism 01/22/2007  . RAYNAUD'S SYNDROME 01/22/2007  . EXTERNAL HEMORRHOIDS 01/22/2007  . DIVERTICULOSIS, COLON 01/22/2007  .  FIBROCYSTIC BREAST DISEASE 01/22/2007  . Osteoporosis 01/22/2007  . Essential hypertension 12/16/2006  . GERD 12/16/2006  . DYSPNEA ON EXERTION 12/16/2006  . Cough 12/16/2006  . TOBACCO ABUSE, HX OF 12/16/2006  . HYSTERECTOMY, HX OF 12/16/2006   Past Medical History:  Diagnosis Date  . Acquired absence of organ, genital organs   . Breast cancer ([place])    surgery at [facility]  . Chronic airway obstruction, not elsewhere classified   . Colitis, ischemic ([place]) 07/2000   ? infect  . Cough   . Degeneration of cervical intervertebral disc   . Diffuse cystic mastopathy   . Diverticulosis of colon (without mention of hemorrhage)   . Esophageal reflux   . External hemorrhoids without mention of complication   . Fibula fracture 2006  .  Flatulence, eructation, and gas pain   . Hyperlipidemia   . Lichen sclerosus    back/abd/vulvar  . Mastoiditis of right side 04/2005   admitted  . Osteoporosis, unspecified   . Other acute sinusitis   . Other and unspecified hyperlipidemia   . Other dyspnea and respiratory abnormality   . Personal history of tobacco use, presenting hazards to health   . Predominant disturbance of emotions   . Raynaud's syndrome   . Rheumatoid arthritis(714.0)    Sero-negative  . Spinal stenosis   . Unspecified essential hypertension   . Unspecified gastritis and gastroduodenitis without mention of hemorrhage   . Unspecified hypothyroidism   . Wears dentures    top   Past Surgical History:  Procedure Laterality Date  . ABDOMINAL HYSTERECTOMY  1968  . Cardiollite  02/2002   Neg  . COLONOSCOPY    . DEXA  2001   OP,heel  . DEXA     OP hip T-2.94  . NASAL SINUS SURGERY  05/2002  . Nuclear Stress Test  11/07   Normal  . TONSILLECTOMY    . US ECHOCARDIOGRAPHY  10/06   normal LVF EF 55-65%   Social History  Substance Use Topics  . Smoking status: Former Smoker    Quit date: 02/07/1962  . Smokeless tobacco: Never Used  . Alcohol use 0.0 oz/week     Comment: occ wine   Family History  Problem Relation Age of Onset  . Heart attack Father 84  . Coronary artery disease Sister 40    CABG  . Heart attack Brother 71  . Coronary artery disease      GF   Allergies  Allergen Reactions  . Ace Inhibitors     REACTION: cough  . Ezetimibe     REACTION: myalgia  . Ezetimibe-Simvastatin     REACTION: joints swell  . Macrobid [Nitrofurantoin]     ABD PAIN, ALSO BODY ACHES/PAIN  . Moxifloxacin   . Rosuvastatin     REACTION: myalgia  . Septra [Sulfamethoxazole-Trimethoprim]     Sick feeling   . Simvastatin     REACTION: myalgia  . Triaminic    Current Outpatient Prescriptions on File Prior to Visit  Medication Sig Dispense Refill  . Cholecalciferol (VITAMIN D) 2000 units tablet Take 2,000  Units by mouth daily.    . cyanocobalamin (,VITAMIN B-12,) 1000 MCG/ML injection Inject 1,000 mcg into the muscle every 6 (six) weeks.    Marland Kitchen levothyroxine (SYNTHROID, LEVOTHROID) 100 MCG tablet Take 1 tablet (100 mcg total) by mouth daily. 90 tablet 3  . Polyethylene Glycol 3350 (MIRALAX PO) Take by mouth.     No current facility-administered medications on file prior to  visit.     Review of Systems Review of Systems  Constitutional: Negative for fever, appetite change,  and unexpected weight change. pos for fatigue and dec in appetite from stress Eyes: Negative for pain and visual disturbance.  Respiratory: Negative for wheeze and shortness of breath.  pos for cough that is gradually improving  Cardiovascular: Negative for cp or palpitations    Gastrointestinal: Negative for nausea, diarrhea and constipation.  Genitourinary: Negative for urgency and frequency.  Skin: Negative for pallor or rash   Neurological: Negative for weakness, light-headedness, numbness and headaches. pos for tremor of head and hands that is worse when tired or stressed  Hematological: Negative for adenopathy. Does not bruise/bleed easily.  Psychiatric/Behavioral: Negative for dysphoric mood. The patient is quite nervous/anxious.         Objective:   Physical Exam  Constitutional: She appears well-developed and well-nourished. No distress.  Frail appearing elderly female -in no distress  HENT:  Head: Normocephalic and atraumatic.  Mouth/Throat: Oropharynx is clear and moist.  Eyes: Conjunctivae and EOM are normal. Pupils are equal, round, and reactive to light.  Neck: Normal range of motion. Neck supple. No JVD present. Carotid bruit is not present. No thyromegaly present.  Cardiovascular: Normal rate, regular rhythm and intact distal pulses.  Exam reveals no gallop.   Murmur heard. Pulmonary/Chest: Effort normal and breath sounds normal. No respiratory distress. She has no wheezes. She has no rales. She  exhibits no tenderness.  No crackles Good air exchange No coughing during visit No rales or rhonchi    Abdominal: Soft. Bowel sounds are normal. She exhibits no distension, no abdominal bruit and no mass. There is no tenderness.  Musculoskeletal: She exhibits no edema or tenderness.  Lymphadenopathy:    She has no cervical adenopathy.  Neurological: She is alert. She has normal reflexes. She displays tremor. No cranial nerve deficit. She exhibits normal muscle tone. Coordination normal.  Mild head tremor noted only when walking No tremor at rest  No bradykinesia or cogwheel rigidity  Nl facial expression   Skin: Skin is warm and dry. No rash noted. No pallor.  Psychiatric: Her behavior is normal. Her mood appears anxious. Her affect is not blunt, not labile and not inappropriate. Her speech is not rapid and/or pressured. She is not agitated and not slowed. Thought content is not paranoid. Cognition and memory are not impaired. She does not exhibit a depressed mood. She expresses no homicidal and no suicidal ideation.  Not tearful Talks candidly about stressors           Assessment & Plan:   Problem List Items Addressed This Visit      Cardiovascular and Mediastinum   Essential hypertension    bp in fair control at this time  BP Readings from Last 1 Encounters:  05/16/16 136/68   No changes needed Disc lifstyle change with low sodium diet and exercise        Relevant Orders   Comprehensive metabolic panel     Endocrine   Hypothyroidism    Overdue for TSH- changed dose in dec  Lab Results  Component Value Date   TSH 9.87 (H) 01/12/2016    On 100 mcg daily- says she is compliant  Lab today  She is feeling more shaky and nervous Has wt loss but also less appetite      Relevant Orders   TSH     Other   Sleep disorder    Anxiety is worsening this- hope that  tx with buspar helps along with dec in stressors       STRESS REACTION, ACUTE, WITH EMOTIONAL  DISTURBANCE    Reviewed stressors/ coping techniques/symptoms/ support sources/ tx options and side effects in detail today Anxiety symptoms are starting to interfere with her daily fxn and care giving Long disc re caregiver stress (son and husband)  Stressors are starting to level out and she is getting help Disc opt for tx -she declines counseling  Will try buspar 7.5 bid  Discussed expectations of SSRI medication including time to effectiveness and mechanism of action, also poss of side effects (early and late)- including mental fuzziness, weight or appetite change, nausea and poss of worse dep or anxiety (even suicidal thoughts)  Pt voiced understanding and will stop med and update if this occurs   Will also watch for hyponatremia due to her age  >25 minutes spent in face to face time with patient, >50% spent in counselling or coordination of care       Tremor - Primary    Head tremor (occ hands) that worsens with anxiety  Intentional /not at rest  No s/s of Parkinsons  Will continue to follow  Hope that tx of anxiety helps  Aware of how this aff balance- she will keep walker with her nearby at home and take out when out of the house

## 2016-05-16 NOTE — Assessment & Plan Note (Addendum)
Head tremor (occ hands) that worsens with anxiety  Intentional /not at rest  No s/s of Parkinsons  Will continue to follow  Hope that tx of anxiety helps  Aware of how this aff balance- she will keep walker with her nearby at home and take out when out of the house

## 2016-05-17 LAB — COMPREHENSIVE METABOLIC PANEL
ALT: 13 U/L (ref 0–35)
AST: 15 U/L (ref 0–37)
Albumin: 4.1 g/dL (ref 3.5–5.2)
Alkaline Phosphatase: 46 U/L (ref 39–117)
BUN: 22 mg/dL (ref 6–23)
CO2: 28 mEq/L (ref 19–32)
Calcium: 9.4 mg/dL (ref 8.4–10.5)
Chloride: 104 mEq/L (ref 96–112)
Creatinine, Ser: 0.98 mg/dL (ref 0.40–1.20)
GFR: 56.78 mL/min — ABNORMAL LOW (ref >60.00)
Glucose, Bld: 94 mg/dL (ref 70–99)
Potassium: 4.7 mEq/L (ref 3.5–5.1)
Sodium: 138 mEq/L (ref 135–145)
Total Bilirubin: 0.4 mg/dL (ref 0.2–1.2)
Total Protein: 7.1 g/dL (ref 6.0–8.3)

## 2016-05-17 LAB — TSH: TSH: 3.55 u[IU]/mL (ref 0.35–4.50)

## 2016-05-18 ENCOUNTER — Encounter: Payer: Self-pay | Admitting: *Deleted

## 2016-06-13 ENCOUNTER — Encounter: Payer: Self-pay | Admitting: Family Medicine

## 2016-06-13 ENCOUNTER — Ambulatory Visit (INDEPENDENT_AMBULATORY_CARE_PROVIDER_SITE_OTHER): Payer: Medicare HMO | Admitting: Family Medicine

## 2016-06-13 DIAGNOSIS — R21 Rash and other nonspecific skin eruption: Secondary | ICD-10-CM | POA: Diagnosis not present

## 2016-06-13 MED ORDER — PREDNISONE 20 MG PO TABS: ORAL_TABLET | ORAL | 0 refills | Status: DC

## 2016-06-13 MED ORDER — TRIAMCINOLONE ACETONIDE 0.5 % EX CREA: 1.0000 "application " | TOPICAL_CREAM | Freq: Three times a day (TID) | CUTANEOUS | 0 refills | Status: DC | PRN

## 2016-06-13 NOTE — Progress Notes (Signed)
Rash and itching started 06/10/16.  Started on the L thumb then moved to other areas, B arms, then ankles. No FCNAVD.  She doesn't feel sick o/w.  She can't sleep.  No new soaps.  No known poison ivy exposure.  No new meds.  No one else has troubles.  She has a dog day goes inside and outside.  She tried mult topical creams, etc.  Tried icing the areas.    Meds, vitals, and allergies reviewed.   ROS: Per HPI unless specifically indicated in ROS section   nad OP wnl, mmm rrr ctab Blanching lesions on the arms and B lower legs, spares the palms.  1 lesion also on the L lower abd.  no ulceration. Extremities without edema

## 2016-06-13 NOTE — Assessment & Plan Note (Signed)
No one else has symptoms. This is not thought to be infectious or contagious. Question if she has poison ivy dermatitis. Unclear if the dog could've had an exposure and then the patient was exposed subsequently. In any event reasonable to try prednisone given the itching. Start prednisone. Use triamcinolone if needed. Routine steroid cautions given. She agrees. Okay for outpatient follow-up.

## 2016-06-13 NOTE — Progress Notes (Signed)
Pre visit review using our clinic review tool, if applicable. No additional management support is needed unless otherwise documented below in the visit note. 

## 2016-06-13 NOTE — Patient Instructions (Signed)
Prednisone with food, cream if needed.  Update [place] as needed.  Take care.  Glad to see you.

## 2016-06-29 ENCOUNTER — Ambulatory Visit (INDEPENDENT_AMBULATORY_CARE_PROVIDER_SITE_OTHER): Payer: Medicare HMO | Admitting: Family Medicine

## 2016-06-29 ENCOUNTER — Encounter: Payer: Self-pay | Admitting: Family Medicine

## 2016-06-29 ENCOUNTER — Telehealth: Payer: Self-pay

## 2016-06-29 VITALS — BP 116/68 | HR 63 | Temp 98.3°F | Ht 61.5 in | Wt 138.5 lb

## 2016-06-29 DIAGNOSIS — R21 Rash and other nonspecific skin eruption: Secondary | ICD-10-CM | POA: Diagnosis not present

## 2016-06-29 DIAGNOSIS — F418 Other specified anxiety disorders: Secondary | ICD-10-CM | POA: Diagnosis not present

## 2016-06-29 DIAGNOSIS — R69 Illness, unspecified: Secondary | ICD-10-CM | POA: Diagnosis not present

## 2016-06-29 MED ORDER — HYDROXYZINE HCL 25 MG PO TABS: 25.0000 mg | ORAL_TABLET | Freq: Three times a day (TID) | ORAL | 1 refills | Status: DC | PRN

## 2016-06-29 MED ORDER — TRIAMCINOLONE ACETONIDE 0.5 % EX CREA: 1.0000 "application " | TOPICAL_CREAM | Freq: Three times a day (TID) | CUTANEOUS | 0 refills | Status: DC | PRN

## 2016-06-29 NOTE — Assessment & Plan Note (Signed)
Pt was improving initially with buspar and dec in stressors  Now prednisone has made her more anxious  Reassured this would improve  Will monitor  Continue the buspar

## 2016-06-29 NOTE — Telephone Encounter (Signed)
PA completed on CoverMyMeds.   Awaiting Decision.

## 2016-06-29 NOTE — Telephone Encounter (Signed)
Pt left v/m that she could not pick up hydroxyzine until more info was given by [facility]. I spoke with [facility] at [place] and prior auth for hydroxyzine is needed. Dollie has faxed request to [facility]. Pt request cb.

## 2016-06-29 NOTE — Telephone Encounter (Signed)
PA approved effective 02/06/2016 through 02/06/2017.  Mrs. IXL notified by telephone.  [facility] notified of approval via fax.

## 2016-06-29 NOTE — Progress Notes (Signed)
Subjective:    Patient ID: Dawn Aguirre, female    DOB: 05/18/1925, 83 y.o.   MRN: [id number]  HPI Here for symptoms of poison ivy dermatitis   Itchy rash started on 5/4 on thumb and then spread to other areas Saw Dr Damita Dunnings and diag with poss contact derm (? If could have contracted poison ivy from her dog)  She was px prednisone  No better Intense itching  Does not feel well   A little nausea   Using ice at night to reduce itching  No antihistamines  Picked up a first aid cream-no help Using the triamcinolone  Also calamine lotion   She has it on her arms/not hands  Now all the way up her ams  Some red spots - come and go (some spots stay and some don't) It had extended to feet  Prev spot on thumb is gone  Some on her back   Face itched but no rash None on scalp   Outdoors very little  Uses dial soap  No new fragrance or creams or detergents  Has some mild pollen allergies   No hx of hives in the past     Worst on L foot L wrist R elbow and upper arm      Patient Active Problem List   Diagnosis Date Noted  . Rash 06/13/2016  . Sleep disorder 01/13/2016  . Female cystocele 12/30/2015  . Left shoulder pain 09/02/2015  . Neck pain 09/02/2015  . Allergic dermatitis 08/05/2015  . Routine general medical examination at a health care facility 07/22/2015  . Hearing loss 07/22/2015  . Hyperglycemia 07/22/2015  . Anxiety disorder 04/24/2015  . Abdominal bloating 03/06/2015  . Tremor 12/20/2014  . Chronic pain 10/14/2014  . B12 deficiency 07/16/2014  . Vitamin D deficiency 07/16/2014  . Fatigue 07/04/2014  . Encounter for Medicare annual wellness exam 01/13/2014  . Fall in home 12/24/2013  . History of breast cancer 12/18/2013  . Epiploic appendagitis 02/22/2013  . Dry mouth 02/04/2013  . Chest wall pain 12/25/2012  . Benign paroxysmal positional vertigo 09/14/2012  . Low back pain 09/07/2012  . Constipation 11/04/2011  . Aortic stenosis  08/31/2010  . Hyperlipidemia   . STRESS REACTION, ACUTE, WITH EMOTIONAL DISTURBANCE 10/12/2007  . [place] DISEASE, CERVICAL 10/12/2007  . Hypothyroidism 01/22/2007  . RAYNAUD'S SYNDROME 01/22/2007  . EXTERNAL HEMORRHOIDS 01/22/2007  . DIVERTICULOSIS, COLON 01/22/2007  . FIBROCYSTIC BREAST DISEASE 01/22/2007  . Osteoporosis 01/22/2007  . Essential hypertension 12/16/2006  . GERD 12/16/2006  . DYSPNEA ON EXERTION 12/16/2006  . Cough 12/16/2006  . TOBACCO ABUSE, HX OF 12/16/2006  . HYSTERECTOMY, HX OF 12/16/2006   Past Medical History:  Diagnosis Date  . Acquired absence of organ, genital organs   . Breast cancer ([place])    surgery at [facility]  . Chronic airway obstruction, not elsewhere classified   . Colitis, ischemic ([place]) 07/2000   ? infect  . Cough   . Degeneration of cervical intervertebral disc   . Diffuse cystic mastopathy   . Diverticulosis of colon (without mention of hemorrhage)   . Esophageal reflux   . External hemorrhoids without mention of complication   . Fibula fracture 2006  . Flatulence, eructation, and gas pain   . Hyperlipidemia   . Lichen sclerosus    back/abd/vulvar  . Mastoiditis of right side 04/2005   admitted  . Osteoporosis, unspecified   . Other acute sinusitis   . Other and unspecified hyperlipidemia   .  Other dyspnea and respiratory abnormality   . Personal history of tobacco use, presenting hazards to health   . Predominant disturbance of emotions   . Raynaud's syndrome   . Rheumatoid arthritis(714.0)    Sero-negative  . Spinal stenosis   . Unspecified essential hypertension   . Unspecified gastritis and gastroduodenitis without mention of hemorrhage   . Unspecified hypothyroidism   . Wears dentures    top   Past Surgical History:  Procedure Laterality Date  . ABDOMINAL HYSTERECTOMY  1968  . Cardiollite  02/2002   Neg  . COLONOSCOPY    . DEXA  2001   OP,heel  . DEXA     OP hip T-2.94  . NASAL SINUS SURGERY  05/2002  . Nuclear Stress  Test  11/07   Normal  . TONSILLECTOMY    . US ECHOCARDIOGRAPHY  10/06   normal LVF EF 55-65%   Social History  Substance Use Topics  . Smoking status: Former Smoker    Quit date: 02/07/1962  . Smokeless tobacco: Never Used  . Alcohol use 0.0 oz/week     Comment: occ wine   Family History  Problem Relation Age of Onset  . Heart attack Father 54  . Coronary artery disease Sister 10       CABG  . Heart attack Brother 40  . Coronary artery disease Unknown        GF   Allergies  Allergen Reactions  . Ace Inhibitors     REACTION: cough  . Ezetimibe     REACTION: myalgia  . Ezetimibe-Simvastatin     REACTION: joints swell  . Macrobid [Nitrofurantoin]     ABD PAIN, ALSO BODY ACHES/PAIN  . Moxifloxacin   . Rosuvastatin     REACTION: myalgia  . Septra [Sulfamethoxazole-Trimethoprim]     Sick feeling   . Simvastatin     REACTION: myalgia  . Triaminic    Current Outpatient Prescriptions on File Prior to Visit  Medication Sig Dispense Refill  . busPIRone (BUSPAR) 15 MG tablet Take 1 tablet (15 mg total) by mouth 2 (two) times daily. 30 tablet 11  . Cholecalciferol (VITAMIN D) 2000 units tablet Take 2,000 Units by mouth daily.    . cyanocobalamin (,VITAMIN B-12,) 1000 MCG/ML injection Inject 1,000 mcg into the muscle every 6 (six) weeks.    Marland Kitchen levothyroxine (SYNTHROID, LEVOTHROID) 100 MCG tablet Take 1 tablet (100 mcg total) by mouth daily. 90 tablet 3  . Polyethylene Glycol 3350 (MIRALAX PO) Take by mouth.    . predniSONE (DELTASONE) 20 MG tablet 2 tabs a day for 5 days then 1 tab a day for 5 days, with food. 15 tablet 0   No current facility-administered medications on file prior to visit.     Review of Systems Review of Systems  Constitutional: Negative for fever, appetite change, fatigue and unexpected weight change.  Eyes: Negative for pain and visual disturbance.  Respiratory: Negative for cough and shortness of breath.   Cardiovascular: Negative for cp or palpitations     Gastrointestinal: Negative for , diarrhea and constipation. pos for dyspepsia Genitourinary: Negative for urgency and frequency.  Skin: Negative for pallor and pos for itchy rash   Neurological: Negative for weakness, light-headedness, numbness and headaches.  Hematological: Negative for adenopathy. Does not bruise/bleed easily.  Psychiatric/Behavioral: Negative for dysphoric mood. Pos for anxiety with tremor         Objective:   Physical Exam  Constitutional: She appears well-developed and well-nourished. No distress.  Well appearing but anxious elderly female  HENT:  Head: Normocephalic and atraumatic.  Mouth/Throat: Oropharynx is clear and moist. No oropharyngeal exudate.  Eyes: Conjunctivae and EOM are normal. Pupils are equal, round, and reactive to light. Right eye exhibits no discharge. Left eye exhibits no discharge. No scleral icterus.  Neck: Normal range of motion. Neck supple.  Cardiovascular: Normal rate and regular rhythm.   Pulmonary/Chest: Effort normal and breath sounds normal. She has no wheezes.  Abdominal: Soft. Bowel sounds are normal. She exhibits no distension. There is no tenderness.  Lymphadenopathy:    She has no cervical adenopathy.  Neurological: She is alert. She displays tremor.  Skin: Skin is warm and dry. Rash noted.  Nickel to quarter size whelps (non blanching) noted on R wrist/arm/elbow area, L wrist . L dorsal foot  Few areas of bruised excoriation on L foot   No evidence of infection  Some scratch marks No rash on back/neck/scalp or face (though she c/o of itching everywhere)  Psychiatric: Her mood appears anxious.          Assessment & Plan:   Problem List Items Addressed This Visit      Musculoskeletal and Integument   Rash    Pt returns for re eval today  Claims no improvement with prednisone and itching is intense Some features or urticaria (lesions somewhat resemble whelps)- and per pt can come and go  Not vesicular  She is  having significant side eff of prednisone (tremor/anxiety/irritable) Suspect allergic in nature -but no angioedema  Will try hydroxyzine 25 for itch with great caution of sedation and falls Also zyrtec 10 mg daily  Continue triamcinolone  Update if not starting to improve in a week or if worsening  Would consider derm ref  See avs for sympt care  >25 minutes spent in face to face time with patient, >50% spent in counselling or coordination of care - we reviewed previous note and disc above in detail -making a long term plan        Other   Anxiety disorder    Pt was improving initially with buspar and dec in stressors  Now prednisone has made her more anxious  Reassured this would improve  Will monitor  Continue the buspar

## 2016-06-29 NOTE — Patient Instructions (Addendum)
Your rash looks allergic - it somewhat resembles hives but I'm not entirely sure  If you worsen/develop fever or other symptoms let us know  Prednisone may be causing hot flashes/anxiety/shakiness and trouble sleeping-this will get better as it leaves your system  Get zyrtec 10 mg and take 1 pill each day Try the hydroxyzine (very strong antihistamine) up to three times daily and watch out for sedation and fall risk Keep cool  Continue the triamcinolone cream   Switch soap to dove Use only unscented products and avoid perfume  Switch to a laundry detergent that is "free"= no color or fragrance  Don't use fabric softener  If not improved in the several days we will set you up with dermatology

## 2016-06-29 NOTE — Assessment & Plan Note (Addendum)
Pt returns for re eval today  Claims no improvement with prednisone and itching is intense Some features or urticaria (lesions somewhat resemble whelps)- and per pt can come and go  Not vesicular  She is having significant side eff of prednisone (tremor/anxiety/irritable) Suspect allergic in nature -but no angioedema  Will try hydroxyzine 25 for itch with great caution of sedation and falls Also zyrtec 10 mg daily  Continue triamcinolone  Update if not starting to improve in a week or if worsening  Would consider derm ref  See avs for sympt care  >25 minutes spent in face to face time with patient, >50% spent in counselling or coordination of care - we reviewed previous note and disc above in detail -making a long term plan

## 2016-09-09 ENCOUNTER — Encounter: Payer: Self-pay | Admitting: Family Medicine

## 2016-09-09 ENCOUNTER — Ambulatory Visit (INDEPENDENT_AMBULATORY_CARE_PROVIDER_SITE_OTHER): Payer: Medicare HMO | Admitting: Family Medicine

## 2016-09-09 VITALS — BP 114/68 | HR 72 | Temp 98.1°F | Ht 61.5 in | Wt 137.5 lb

## 2016-09-09 DIAGNOSIS — R5381 Other malaise: Secondary | ICD-10-CM

## 2016-09-09 DIAGNOSIS — R5383 Other fatigue: Secondary | ICD-10-CM | POA: Diagnosis not present

## 2016-09-09 DIAGNOSIS — R350 Frequency of micturition: Secondary | ICD-10-CM

## 2016-09-09 DIAGNOSIS — K5901 Slow transit constipation: Secondary | ICD-10-CM

## 2016-09-09 LAB — POC URINALSYSI DIPSTICK (AUTOMATED)
Bilirubin, UA: NEGATIVE
Glucose, UA: NEGATIVE
Ketones, UA: NEGATIVE
Leukocytes, UA: NEGATIVE
Nitrite, UA: NEGATIVE
Protein, UA: NEGATIVE
Spec Grav, UA: 1.025 (ref 1.010–1.025)
Urobilinogen, UA: 0.2 E.U./dL
pH, UA: 5.5 (ref 5.0–8.0)

## 2016-09-09 MED ORDER — CEPHALEXIN 500 MG PO CAPS: 500.0000 mg | ORAL_CAPSULE | Freq: Three times a day (TID) | ORAL | 0 refills | Status: DC

## 2016-09-09 NOTE — Assessment & Plan Note (Signed)
Improved since her urological surgery overall -but still chronic and takes miralax and stool softeners No abd tenderness today

## 2016-09-09 NOTE — Progress Notes (Signed)
Subjective:    Patient ID: Dawn Aguirre, female    DOB: 01-06-1926, 83 y.o.   MRN: [id number]  HPI Here for general malaise -thinks she may have a uti   Yesterday felt chills  Ached all over  No energy at all  99 temp yesterday  She was in bed all day yesterday   Had a little nasal congestion- improved today  More frequent urination yesterday  ? If she may have uti  No change in urine appearance   Results for orders placed or performed in visit on 09/09/16  POCT Urinalysis Dipstick (Automated)  Result Value Ref Range   Color, UA yellow    Clarity, UA clear    Glucose, UA neg    Bilirubin, UA neg    Ketones, UA neg    Spec Grav, UA 1.025 1.010 - 1.025   Blood, UA 2+    pH, UA 5.5 5.0 - 8.0   Protein, UA neg    Urobilinogen, UA 0.2 0.2 or 1.0 E.U./dL   Nitrite, UA neg    Leukocytes, UA Negative Negative      Constipation - has taken correctol and prune juice  Still uses miralax  Still has bm but not as much as she wants to  [facility] on and off    Her cystocele surgery improved that but did not get rid of it   Her L shoulder bothers her more (has had injections in it)    Wt Readings from Last 3 Encounters:  09/09/16 137 lb 8 oz (62.4 kg)  06/29/16 138 lb 8 oz (62.8 kg)  06/13/16 139 lb 8 oz (63.3 kg)   BP Readings from Last 3 Encounters:  09/09/16 114/68  06/29/16 116/68  06/13/16 (!) 150/80   Pulse Readings from Last 3 Encounters:  09/09/16 72  06/29/16 63  06/13/16 78      Chemistry      Component Value Date/Time   NA 138 05/16/2016 1624   K 4.7 05/16/2016 1624   CL 104 05/16/2016 1624   CO2 28 05/16/2016 1624   BUN 22 05/16/2016 1624   CREATININE 0.98 05/16/2016 1624      Component Value Date/Time   CALCIUM 9.4 05/16/2016 1624   ALKPHOS 46 05/16/2016 1624   AST 15 05/16/2016 1624   ALT 13 05/16/2016 1624   BILITOT 0.4 05/16/2016 1624      Lab Results  Component Value Date   WBC 6.2 07/21/2015   HGB 11.9 (L) 07/21/2015   HCT  36.2 07/21/2015   MCV 90.3 07/21/2015   PLT 162.0 07/21/2015   Lab Results  Component Value Date   TSH 3.55 05/16/2016    Last glucose was 94 in April   Last vit D 29   Patient Active Problem List   Diagnosis Date Noted  . Urine frequency 09/09/2016  . Rash 06/13/2016  . Sleep disorder 01/13/2016  . Female cystocele 12/30/2015  . Left shoulder pain 09/02/2015  . Neck pain 09/02/2015  . Allergic dermatitis 08/05/2015  . Routine general medical examination at a health care facility 07/22/2015  . Hearing loss 07/22/2015  . Hyperglycemia 07/22/2015  . Anxiety disorder 04/24/2015  . Abdominal bloating 03/06/2015  . Tremor 12/20/2014  . Chronic pain 10/14/2014  . B12 deficiency 07/16/2014  . Vitamin D deficiency 07/16/2014  . Fatigue 07/04/2014  . Encounter for Medicare annual wellness exam 01/13/2014  . Fall in home 12/24/2013  . History of breast cancer 12/18/2013  . Epiploic appendagitis  02/22/2013  . Dry mouth 02/04/2013  . Chest wall pain 12/25/2012  . Benign paroxysmal positional vertigo 09/14/2012  . Low back pain 09/07/2012  . Constipation 11/04/2011  . Aortic stenosis 08/31/2010  . Hyperlipidemia   . STRESS REACTION, ACUTE, WITH EMOTIONAL DISTURBANCE 10/12/2007  . [place] DISEASE, CERVICAL 10/12/2007  . Hypothyroidism 01/22/2007  . RAYNAUD'S SYNDROME 01/22/2007  . EXTERNAL HEMORRHOIDS 01/22/2007  . DIVERTICULOSIS, COLON 01/22/2007  . FIBROCYSTIC BREAST DISEASE 01/22/2007  . Osteoporosis 01/22/2007  . Essential hypertension 12/16/2006  . GERD 12/16/2006  . DYSPNEA ON EXERTION 12/16/2006  . Cough 12/16/2006  . TOBACCO ABUSE, HX OF 12/16/2006  . HYSTERECTOMY, HX OF 12/16/2006   Past Medical History:  Diagnosis Date  . Acquired absence of organ, genital organs   . Breast cancer ([place])    surgery at [facility]  . Chronic airway obstruction, not elsewhere classified   . Colitis, ischemic ([place]) 07/2000   ? infect  . Cough   . Degeneration of cervical intervertebral  disc   . Diffuse cystic mastopathy   . Diverticulosis of colon (without mention of hemorrhage)   . Esophageal reflux   . External hemorrhoids without mention of complication   . Fibula fracture 2006  . Flatulence, eructation, and gas pain   . Hyperlipidemia   . Lichen sclerosus    back/abd/vulvar  . Mastoiditis of right side 04/2005   admitted  . Osteoporosis, unspecified   . Other acute sinusitis   . Other and unspecified hyperlipidemia   . Other dyspnea and respiratory abnormality   . Personal history of tobacco use, presenting hazards to health   . Predominant disturbance of emotions   . Raynaud's syndrome   . Rheumatoid arthritis(714.0)    Sero-negative  . Spinal stenosis   . Unspecified essential hypertension   . Unspecified gastritis and gastroduodenitis without mention of hemorrhage   . Unspecified hypothyroidism   . Wears dentures    top   Past Surgical History:  Procedure Laterality Date  . ABDOMINAL HYSTERECTOMY  1968  . Cardiollite  02/2002   Neg  . COLONOSCOPY    . DEXA  2001   OP,heel  . DEXA     OP hip T-2.94  . NASAL SINUS SURGERY  05/2002  . Nuclear Stress Test  11/07   Normal  . TONSILLECTOMY    . US ECHOCARDIOGRAPHY  10/06   normal LVF EF 55-65%   Social History  Substance Use Topics  . Smoking status: Former Smoker    Quit date: 02/07/1962  . Smokeless tobacco: Never Used  . Alcohol use 0.0 oz/week     Comment: occ wine   Family History  Problem Relation Age of Onset  . Heart attack Father 42  . Coronary artery disease Sister 82       CABG  . Heart attack Brother 14  . Coronary artery disease Unknown        GF   Allergies  Allergen Reactions  . Ace Inhibitors     REACTION: cough  . Ezetimibe     REACTION: myalgia  . Ezetimibe-Simvastatin     REACTION: joints swell  . Macrobid [Nitrofurantoin]     ABD PAIN, ALSO BODY ACHES/PAIN  . Moxifloxacin   . Rosuvastatin     REACTION: myalgia  . Septra [Sulfamethoxazole-Trimethoprim]      Sick feeling   . Simvastatin     REACTION: myalgia  . Triaminic    Current Outpatient Prescriptions on File Prior to Visit  Medication Sig  Dispense Refill  . busPIRone (BUSPAR) 15 MG tablet Take 1 tablet (15 mg total) by mouth 2 (two) times daily. 30 tablet 11  . Cholecalciferol (VITAMIN D) 2000 units tablet Take 2,000 Units by mouth daily.    . cyanocobalamin (,VITAMIN B-12,) 1000 MCG/ML injection Inject 1,000 mcg into the muscle every 6 (six) weeks.    . hydrOXYzine (ATARAX/VISTARIL) 25 MG tablet Take 1 tablet (25 mg total) by mouth 3 (three) times daily as needed. For severe itching, caution of sedation 30 tablet 1  . levothyroxine (SYNTHROID, LEVOTHROID) 100 MCG tablet Take 1 tablet (100 mcg total) by mouth daily. 90 tablet 3  . Polyethylene Glycol 3350 (MIRALAX PO) Take by mouth.    . triamcinolone cream (KENALOG) 0.5 % Apply 1 application topically 3 (three) times daily as needed. 30 g 0   No current facility-administered medications on file prior to visit.      Review of Systems Review of Systems  Constitutional: Negative for  unexpected weight change. pos for poor appetite/malaise/fatigue  Eyes: Negative for pain and visual disturbance.  ENT neg for rhinorrhea/st or sinus pain  Respiratory: Negative for cough and shortness of breath.   Cardiovascular: Negative for cp or palpitations    Gastrointestinal: Negative for nausea, diarrhea and pos for chronic constipation.  Genitourinary: pos for urgency and frequency/ neg for dysuria or hematuria or flank pain  Skin: Negative for pallor or rash   Neurological: Negative for weakness, light-headedness, numbness and headaches. pos for "shaky" and unsteady feeling when she is ill  Hematological: Negative for adenopathy. Does not bruise/bleed easily.  Psychiatric/Behavioral: Negative for dysphoric mood. The patient is usually somewhat  nervous/anxious.         Objective:   Physical Exam  Constitutional: She appears well-developed and  well-nourished. No distress.  Frail appearing elderly female   HENT:  Head: Normocephalic and atraumatic.  Right Ear: External ear normal.  Left Ear: External ear normal.  Nose: Nose normal.  Mouth/Throat: Oropharynx is clear and moist.  Nares are boggy No sinus tenderness  Eyes: Pupils are equal, round, and reactive to light. Conjunctivae and EOM are normal. Right eye exhibits no discharge. Left eye exhibits no discharge. No scleral icterus.  Neck: Normal range of motion. Neck supple. No JVD present. Carotid bruit is not present. No thyromegaly present.  Cardiovascular: Normal rate, regular rhythm and intact distal pulses.  Exam reveals no gallop.   Murmur heard. Baseline AS murmur  Pulmonary/Chest: Effort normal and breath sounds normal. No respiratory distress. She has no wheezes. She has no rales. She exhibits no tenderness.  Abdominal: Soft. Bowel sounds are normal. She exhibits no distension and no mass. There is no tenderness. There is no rebound and no guarding.  No suprapubic tenderness or fullness   No cva tenderness   Musculoskeletal: She exhibits no edema or tenderness.  Lymphadenopathy:    She has no cervical adenopathy.  Neurological: She is alert. She has normal reflexes. No cranial nerve deficit. She exhibits normal muscle tone. Coordination normal.  Mild hand tremor when she is anxious   Skin: Skin is warm and dry. No rash noted. No erythema. No pallor.  No rash or tick bites noted   Psychiatric: Her mood appears anxious. Her affect is not blunt, not labile and not inappropriate. She does not exhibit a depressed mood.  Mildly anxious but attentive and pleasant Supportive husband present           Assessment & Plan:

## 2016-09-09 NOTE — Patient Instructions (Addendum)
Use your walker if you feel unsteady  I think you have a uti  We will get a urine culture to see for sure   Aim for 64 oz of fluids per day   Take the cephalexin (keflex) as directed   Update if not starting to improve in a several days or if worsening  - if much worse over the weekend alert [place] and go to urgent care or ER

## 2016-09-09 NOTE — Assessment & Plan Note (Signed)
Worse since yesterday  ua with rbc  Hx of utis -she thinks this is the cause/ frequent urination  tx with keflex  Rest/fluids Pend urine cx  Update if worse or no imp  -inst to alert [place] and seek care outside office this weekend if worse or if new symptoms develop

## 2016-09-09 NOTE — Assessment & Plan Note (Signed)
Since yesterday with malaise and fatigue and poor appetite  Suspect uti  She has not had one since her urologic surgery for cystocele  ua pos for rbc but not wbc and sg is 1.025  Enc water intake as usual   Sent for cx Begin keflex 500 tid until cx returns  She will update if no interim improvement of symptoms or if worse/ esp fever and malaise

## 2016-09-10 LAB — URINE CULTURE: Organism ID, Bacteria: NO GROWTH

## 2016-09-12 ENCOUNTER — Telehealth: Payer: Self-pay | Admitting: Family Medicine

## 2016-09-12 DIAGNOSIS — R5383 Other fatigue: Secondary | ICD-10-CM

## 2016-09-12 DIAGNOSIS — R5382 Chronic fatigue, unspecified: Secondary | ICD-10-CM

## 2016-09-12 DIAGNOSIS — E538 Deficiency of other specified B group vitamins: Secondary | ICD-10-CM

## 2016-09-12 DIAGNOSIS — E559 Vitamin D deficiency, unspecified: Secondary | ICD-10-CM

## 2016-09-12 DIAGNOSIS — R5381 Other malaise: Secondary | ICD-10-CM

## 2016-09-12 DIAGNOSIS — R739 Hyperglycemia, unspecified: Secondary | ICD-10-CM

## 2016-09-12 DIAGNOSIS — E039 Hypothyroidism, unspecified: Secondary | ICD-10-CM

## 2016-09-12 NOTE — Telephone Encounter (Signed)
Labs are ordered 

## 2016-09-12 NOTE — Telephone Encounter (Signed)
-----   Message from Modena Nunnery, [place] sent at 09/12/2016 11:09 AM EDT ----- Spoke to pt and informed her of results. States she is "not feeling the best." lab appt scheduled for 8/7

## 2016-09-13 ENCOUNTER — Other Ambulatory Visit (INDEPENDENT_AMBULATORY_CARE_PROVIDER_SITE_OTHER): Payer: Medicare HMO

## 2016-09-13 DIAGNOSIS — R5381 Other malaise: Secondary | ICD-10-CM

## 2016-09-13 DIAGNOSIS — E559 Vitamin D deficiency, unspecified: Secondary | ICD-10-CM

## 2016-09-13 DIAGNOSIS — E039 Hypothyroidism, unspecified: Secondary | ICD-10-CM

## 2016-09-13 DIAGNOSIS — R739 Hyperglycemia, unspecified: Secondary | ICD-10-CM | POA: Diagnosis not present

## 2016-09-13 DIAGNOSIS — R5383 Other fatigue: Secondary | ICD-10-CM

## 2016-09-13 DIAGNOSIS — R5382 Chronic fatigue, unspecified: Secondary | ICD-10-CM

## 2016-09-13 DIAGNOSIS — E538 Deficiency of other specified B group vitamins: Secondary | ICD-10-CM | POA: Diagnosis not present

## 2016-09-13 LAB — CBC WITH DIFFERENTIAL/PLATELET
Basophils Absolute: 0 10*3/uL (ref 0.0–0.1)
Basophils Relative: 0.5 % (ref 0.0–3.0)
Eosinophils Absolute: 0.3 10*3/uL (ref 0.0–0.7)
Eosinophils Relative: 5.1 % — ABNORMAL HIGH (ref 0.0–5.0)
HCT: 35.6 % — ABNORMAL LOW (ref 36.0–46.0)
Hemoglobin: 11.7 g/dL — ABNORMAL LOW (ref 12.0–15.0)
Lymphocytes Relative: 37.5 % (ref 12.0–46.0)
Lymphs Abs: 1.9 10*3/uL (ref 0.7–4.0)
MCHC: 32.9 g/dL (ref 30.0–36.0)
MCV: 89.1 fl (ref 78.0–100.0)
Monocytes Absolute: 0.4 10*3/uL (ref 0.1–1.0)
Monocytes Relative: 8.2 % (ref 3.0–12.0)
Neutro Abs: 2.4 10*3/uL (ref 1.4–7.7)
Neutrophils Relative %: 48.7 % (ref 43.0–77.0)
Platelets: 167 10*3/uL (ref 150.0–400.0)
RBC: 4 Mil/uL (ref 3.87–5.11)
RDW: 13.3 % (ref 11.5–15.5)
WBC: 4.9 10*3/uL (ref 4.0–10.5)

## 2016-09-13 LAB — COMPREHENSIVE METABOLIC PANEL
ALT: 14 U/L (ref 0–35)
AST: 15 U/L (ref 0–37)
Albumin: 4.1 g/dL (ref 3.5–5.2)
Alkaline Phosphatase: 46 U/L (ref 39–117)
BUN: 19 mg/dL (ref 6–23)
CO2: 28 mEq/L (ref 19–32)
Calcium: 9.3 mg/dL (ref 8.4–10.5)
Chloride: 104 mEq/L (ref 96–112)
Creatinine, Ser: 1.05 mg/dL (ref 0.40–1.20)
GFR: 52.4 mL/min — ABNORMAL LOW (ref >60.00)
Glucose, Bld: 102 mg/dL — ABNORMAL HIGH (ref 70–99)
Potassium: 4 mEq/L (ref 3.5–5.1)
Sodium: 140 mEq/L (ref 135–145)
Total Bilirubin: 0.5 mg/dL (ref 0.2–1.2)
Total Protein: 6.9 g/dL (ref 6.0–8.3)

## 2016-09-13 LAB — SEDIMENTATION RATE: Sed Rate: 11 mm/hr (ref 0–30)

## 2016-09-13 LAB — TSH: TSH: 4.56 u[IU]/mL — ABNORMAL HIGH (ref 0.35–4.50)

## 2016-09-13 LAB — VITAMIN D 25 HYDROXY (VIT D DEFICIENCY, FRACTURES): VITD: 29.01 ng/mL — ABNORMAL LOW (ref 30.00–100.00)

## 2016-09-13 LAB — VITAMIN B12: Vitamin B-12: 364 pg/mL (ref 211–911)

## 2016-09-13 LAB — HEMOGLOBIN A1C: Hgb A1c MFr Bld: 5.8 % (ref 4.6–6.5)

## 2016-09-20 DIAGNOSIS — M810 Age-related osteoporosis without current pathological fracture: Secondary | ICD-10-CM | POA: Diagnosis not present

## 2016-09-20 DIAGNOSIS — Z79890 Hormone replacement therapy (postmenopausal): Secondary | ICD-10-CM | POA: Diagnosis not present

## 2016-09-20 DIAGNOSIS — R011 Cardiac murmur, unspecified: Secondary | ICD-10-CM | POA: Diagnosis not present

## 2016-09-20 DIAGNOSIS — D519 Vitamin B12 deficiency anemia, unspecified: Secondary | ICD-10-CM | POA: Diagnosis not present

## 2016-09-20 DIAGNOSIS — Z0000 Encounter for general adult medical examination without abnormal findings: Secondary | ICD-10-CM | POA: Diagnosis not present

## 2016-09-20 DIAGNOSIS — Z87891 Personal history of nicotine dependence: Secondary | ICD-10-CM | POA: Diagnosis not present

## 2016-09-20 DIAGNOSIS — E784 Other hyperlipidemia: Secondary | ICD-10-CM | POA: Diagnosis not present

## 2016-09-20 DIAGNOSIS — M13812 Other specified arthritis, left shoulder: Secondary | ICD-10-CM | POA: Diagnosis not present

## 2016-09-20 DIAGNOSIS — K5900 Constipation, unspecified: Secondary | ICD-10-CM | POA: Diagnosis not present

## 2016-09-20 DIAGNOSIS — Z8669 Personal history of other diseases of the nervous system and sense organs: Secondary | ICD-10-CM | POA: Diagnosis not present

## 2016-09-20 DIAGNOSIS — Z6825 Body mass index (BMI) 25.0-25.9, adult: Secondary | ICD-10-CM | POA: Diagnosis not present

## 2016-09-20 DIAGNOSIS — Z9889 Other specified postprocedural states: Secondary | ICD-10-CM | POA: Diagnosis not present

## 2016-09-20 DIAGNOSIS — E039 Hypothyroidism, unspecified: Secondary | ICD-10-CM | POA: Diagnosis not present

## 2016-10-18 ENCOUNTER — Encounter: Payer: Self-pay | Admitting: Family Medicine

## 2016-10-18 ENCOUNTER — Ambulatory Visit (INDEPENDENT_AMBULATORY_CARE_PROVIDER_SITE_OTHER): Payer: Medicare HMO | Admitting: Family Medicine

## 2016-10-18 VITALS — BP 126/68 | HR 73 | Temp 98.5°F | Ht 61.5 in | Wt 141.8 lb

## 2016-10-18 DIAGNOSIS — R109 Unspecified abdominal pain: Secondary | ICD-10-CM | POA: Diagnosis not present

## 2016-10-18 DIAGNOSIS — K5901 Slow transit constipation: Secondary | ICD-10-CM

## 2016-10-18 DIAGNOSIS — F418 Other specified anxiety disorders: Secondary | ICD-10-CM

## 2016-10-18 DIAGNOSIS — R21 Rash and other nonspecific skin eruption: Secondary | ICD-10-CM | POA: Diagnosis not present

## 2016-10-18 DIAGNOSIS — R69 Illness, unspecified: Secondary | ICD-10-CM | POA: Diagnosis not present

## 2016-10-18 DIAGNOSIS — G8929 Other chronic pain: Secondary | ICD-10-CM | POA: Diagnosis not present

## 2016-10-18 MED ORDER — BUSPIRONE HCL 30 MG PO TABS: 30.0000 mg | ORAL_TABLET | Freq: Two times a day (BID) | ORAL | 11 refills | Status: DC

## 2016-10-18 NOTE — Progress Notes (Signed)
Subjective:    Patient ID: Dawn Aguirre, female    DOB: 03-Nov-1925, 83 y.o.   MRN: [id number]  HPI  Here for multiple complaints  Had a rash last week on arms/legs and face -itchy  Splotches / firey red in color  Is clearing up  Took hydroxyzine  Also anti itch cream ? If exposed to something   Her anxiety is worse  Taking buspar 15 mg bid and is open to inc dose  Stressors are getting worse - care of her down's syndrome child /difficult - unpredictable (he is up all night wandering) Her "nerves are shot"  She is also trying to get too much done in the house / getting rid of records and cleaning out   Making effort to drink more water   She failed to f/u with urology and GI   (too busy)   Urinary symptoms are better   She still has pains in her stomach (travels to different area)  Constipation is off and on  Sees Dr Collene Mares Overdue for a visit      Chronic neck stiffness with headache    Wt Readings from Last 3 Encounters:  10/18/16 141 lb 12 oz (64.3 kg)  09/09/16 137 lb 8 oz (62.4 kg)  06/29/16 138 lb 8 oz (62.8 kg)   26.35 kg/m  BP Readings from Last 3 Encounters:  10/18/16 126/68  09/09/16 114/68  06/29/16 116/68    Patient Active Problem List   Diagnosis Date Noted  . Chronic abdominal pain 10/18/2016  . Urine frequency 09/09/2016  . Malaise and fatigue 09/09/2016  . Rash 06/13/2016  . Sleep disorder 01/13/2016  . Female cystocele 12/30/2015  . Left shoulder pain 09/02/2015  . Neck pain 09/02/2015  . Routine general medical examination at a health care facility 07/22/2015  . Hearing loss 07/22/2015  . Hyperglycemia 07/22/2015  . Anxiety disorder 04/24/2015  . Tremor 12/20/2014  . Chronic pain 10/14/2014  . B12 deficiency 07/16/2014  . Vitamin D deficiency 07/16/2014  . Fatigue 07/04/2014  . Encounter for Medicare annual wellness exam 01/13/2014  . Fall in home 12/24/2013  . History of breast cancer 12/18/2013  . Epiploic appendagitis  02/22/2013  . Dry mouth 02/04/2013  . Chest wall pain 12/25/2012  . Benign paroxysmal positional vertigo 09/14/2012  . Low back pain 09/07/2012  . Constipation 11/04/2011  . Aortic stenosis 08/31/2010  . Hyperlipidemia   . STRESS REACTION, ACUTE, WITH EMOTIONAL DISTURBANCE 10/12/2007  . [place] DISEASE, CERVICAL 10/12/2007  . Hypothyroidism 01/22/2007  . RAYNAUD'S SYNDROME 01/22/2007  . EXTERNAL HEMORRHOIDS 01/22/2007  . DIVERTICULOSIS, COLON 01/22/2007  . FIBROCYSTIC BREAST DISEASE 01/22/2007  . Osteoporosis 01/22/2007  . Essential hypertension 12/16/2006  . GERD 12/16/2006  . DYSPNEA ON EXERTION 12/16/2006  . Cough 12/16/2006  . TOBACCO ABUSE, HX OF 12/16/2006  . HYSTERECTOMY, HX OF 12/16/2006   Past Medical History:  Diagnosis Date  . Acquired absence of organ, genital organs   . Breast cancer ([place])    surgery at [facility]  . Chronic airway obstruction, not elsewhere classified   . Colitis, ischemic ([place]) 07/2000   ? infect  . Cough   . Degeneration of cervical intervertebral disc   . Diffuse cystic mastopathy   . Diverticulosis of colon (without mention of hemorrhage)   . Esophageal reflux   . External hemorrhoids without mention of complication   . Fibula fracture 2006  . Flatulence, eructation, and gas pain   . Hyperlipidemia   . Lichen  sclerosus    back/abd/vulvar  . Mastoiditis of right side 04/2005   admitted  . Osteoporosis, unspecified   . Other acute sinusitis   . Other and unspecified hyperlipidemia   . Other dyspnea and respiratory abnormality   . Personal history of tobacco use, presenting hazards to health   . Predominant disturbance of emotions   . Raynaud's syndrome   . Rheumatoid arthritis(714.0)    Sero-negative  . Spinal stenosis   . Unspecified essential hypertension   . Unspecified gastritis and gastroduodenitis without mention of hemorrhage   . Unspecified hypothyroidism   . Wears dentures    top   Past Surgical History:  Procedure Laterality  Date  . ABDOMINAL HYSTERECTOMY  1968  . Cardiollite  02/2002   Neg  . COLONOSCOPY    . DEXA  2001   OP,heel  . DEXA     OP hip T-2.94  . NASAL SINUS SURGERY  05/2002  . Nuclear Stress Test  11/07   Normal  . TONSILLECTOMY    . US ECHOCARDIOGRAPHY  10/06   normal LVF EF 55-65%   Social History  Substance Use Topics  . Smoking status: Former Smoker    Quit date: 02/07/1962  . Smokeless tobacco: Never Used  . Alcohol use 0.0 oz/week     Comment: occ wine   Family History  Problem Relation Age of Onset  . Heart attack Father 84  . Coronary artery disease Sister 16       CABG  . Heart attack Brother 60  . Coronary artery disease Unknown        GF   Allergies  Allergen Reactions  . Ace Inhibitors     REACTION: cough  . Ezetimibe     REACTION: myalgia  . Ezetimibe-Simvastatin     REACTION: joints swell  . Macrobid [Nitrofurantoin]     ABD PAIN, ALSO BODY ACHES/PAIN  . Moxifloxacin   . Rosuvastatin     REACTION: myalgia  . Septra [Sulfamethoxazole-Trimethoprim]     Sick feeling   . Simvastatin     REACTION: myalgia  . Triaminic    Current Outpatient Prescriptions on File Prior to Visit  Medication Sig Dispense Refill  . Cholecalciferol (VITAMIN D) 2000 units tablet Take 2,000 Units by mouth daily.    . cyanocobalamin (,VITAMIN B-12,) 1000 MCG/ML injection Inject 1,000 mcg into the muscle every 6 (six) weeks.    . hydrOXYzine (ATARAX/VISTARIL) 25 MG tablet Take 1 tablet (25 mg total) by mouth 3 (three) times daily as needed. For severe itching, caution of sedation 30 tablet 1  . levothyroxine (SYNTHROID, LEVOTHROID) 100 MCG tablet Take 1 tablet (100 mcg total) by mouth daily. 90 tablet 3  . Polyethylene Glycol 3350 (MIRALAX PO) Take by mouth.    . triamcinolone cream (KENALOG) 0.5 % Apply 1 application topically 3 (three) times daily as needed. 30 g 0   No current facility-administered medications on file prior to visit.     Review of Systems  Constitutional:  Positive for fatigue. Negative for activity change, appetite change, fever and unexpected weight change.  HENT: Negative for congestion, ear pain, rhinorrhea, sinus pressure and sore throat.   Eyes: Negative for pain, redness and visual disturbance.  Respiratory: Negative for cough, shortness of breath and wheezing.   Cardiovascular: Negative for chest pain, palpitations and leg swelling.  Gastrointestinal: Positive for abdominal pain and constipation. Negative for abdominal distention, anal bleeding, blood in stool, diarrhea, nausea, rectal pain and vomiting.  Endocrine:  Negative for polydipsia and polyuria.  Genitourinary: Negative for dysuria, frequency and urgency.  Musculoskeletal: Positive for arthralgias and back pain. Negative for myalgias.  Skin: Negative for pallor and rash.  Allergic/Immunologic: Negative for environmental allergies.  Neurological: Positive for tremors. Negative for dizziness, syncope and headaches.  Hematological: Negative for adenopathy. Does not bruise/bleed easily.  Psychiatric/Behavioral: Positive for sleep disturbance. Negative for decreased concentration, dysphoric mood and suicidal ideas. The patient is nervous/anxious.        Objective:   Physical Exam  Constitutional: She appears well-developed and well-nourished. No distress.  Well appearing female (looks younger than stated age)   HENT:  Head: Normocephalic and atraumatic.  Mouth/Throat: Oropharynx is clear and moist.  Eyes: Pupils are equal, round, and reactive to light. Conjunctivae and EOM are normal.  Neck: Normal range of motion. Neck supple. No JVD present. Carotid bruit is not present. No thyromegaly present.  Cardiovascular: Normal rate, regular rhythm, normal heart sounds and intact distal pulses.  Exam reveals no gallop.   Pulmonary/Chest: Effort normal and breath sounds normal. No respiratory distress. She has no wheezes. She has no rales.  No crackles  Abdominal: Soft. Bowel sounds are  normal. She exhibits no distension, no pulsatile liver, no abdominal bruit, no ascites, no pulsatile midline mass and no mass. There is no hepatosplenomegaly. There is tenderness in the right upper quadrant, epigastric area and left upper quadrant. There is no rigidity, no rebound, no guarding, no CVA tenderness, no tenderness at McBurney's point and negative Murphy's sign.  No suprapubic tenderness or fullness      Musculoskeletal: She exhibits no edema or tenderness.  Lymphadenopathy:    She has no cervical adenopathy.  Neurological: She is alert. She has normal reflexes. No cranial nerve deficit. She exhibits normal muscle tone. Coordination normal.  Mild hand tremor    Skin: Skin is warm and dry. No rash noted. No pallor.  2 small areas of whelp like lesions -on each lower arm  No vesicles/erythema or other rash  sks diffusely   Psychiatric: Her behavior is normal. Thought content normal. Her mood appears anxious. Her affect is not blunt, not labile and not inappropriate. Her speech is tangential. Thought content is not paranoid. Cognition and memory are normal. She does not exhibit a depressed mood. She expresses no homicidal and no suicidal ideation.  Tangential - pt states she feels overwhelmed with her responsibilities at home and feels the strong need to keep track of everything despite fatigue and poor sleep            Assessment & Plan:   Problem List Items Addressed This Visit      Digestive   Constipation    This has improved since her cystocele repair but not resolved  Also c/o of continued upper abd pain (unhelped in the past by H2 blocker/ anti spasmotics and ppi) Ref to GI (Dr Collene Mares) for continued eval and tx       Relevant Orders   Ambulatory referral to Gastroenterology     Musculoskeletal and Integument   Rash    Pt developed new itchy rash on extremities last week  This is almost completely resolved with anti itch cream  Few areas or redness remain Pt  wonders if it could be related to anxiety or exposure to allergen  Will continue to follow         Other   Anxiety disorder    Worsened lately in face of stressors including care of her son (  and lack of sleep), little to no help from husband, large financial and household obligations despite advanced age  Reviewed stressors/ coping techniques/symptoms/ support sources/ tx options and side effects in detail today   In the interest of trouble shooting-adv her to hire care for her son every night instead of 4 nights per week (he has downs syndrome with dementia)  Also to talk to her husband regarding help with household tasks   Will inc buspar to 30 mg bid as tolerated (if helpful can then inc to tid if needed) She will report back  Also I am eager to refer her to counseling- she is not yet interested but I enc her to consider it       Chronic abdominal pain - Primary    Ongoing upper abdominal pain with intermittent constipation   Not helped in the past by H2 blocker or ppi or anti spasmatic  Will ref to GI (Dr Collene Mares) for further eval       Relevant Orders   Ambulatory referral to Gastroenterology

## 2016-10-18 NOTE — Assessment & Plan Note (Signed)
Pt developed new itchy rash on extremities last week  This is almost completely resolved with anti itch cream  Few areas or redness remain Pt wonders if it could be related to anxiety or exposure to allergen  Will continue to follow

## 2016-10-18 NOTE — Assessment & Plan Note (Signed)
This has improved since her cystocele repair but not resolved  Also c/o of continued upper abd pain (unhelped in the past by H2 blocker/ anti spasmotics and ppi) Ref to GI (Dr Collene Mares) for continued eval and tx

## 2016-10-18 NOTE — Patient Instructions (Addendum)
You may need more frequent overnight care for Dawn Aguirre so you can get rest   We will refer you to your GI for ongoing GI problems   Increase your buspar to 30 mg twice daily for anxiety  Let me know how it goes in 2 weeks - we can increase it to three times daily  If any side effects or problems please alert me  When you are ready to see a counselor please let me know   Get your husband to help you more at home

## 2016-10-18 NOTE — Assessment & Plan Note (Addendum)
Ongoing upper abdominal pain with intermittent constipation   Not helped in the past by H2 blocker or ppi or anti spasmatic  Will ref to GI (Dr Collene Mares) for further eval

## 2016-10-18 NOTE — Assessment & Plan Note (Signed)
Worsened lately in face of stressors including care of her son (and lack of sleep), little to no help from husband, large financial and household obligations despite advanced age  Reviewed stressors/ coping techniques/symptoms/ support sources/ tx options and side effects in detail today   In the interest of trouble shooting-adv her to hire care for her son every night instead of 4 nights per week (he has downs syndrome with dementia)  Also to talk to her husband regarding help with household tasks   Will inc buspar to 30 mg bid as tolerated (if helpful can then inc to tid if needed) She will report back  Also I am eager to refer her to counseling- she is not yet interested but I enc her to consider it

## 2016-10-25 DIAGNOSIS — Z1211 Encounter for screening for malignant neoplasm of colon: Secondary | ICD-10-CM | POA: Diagnosis not present

## 2016-10-25 DIAGNOSIS — R1033 Periumbilical pain: Secondary | ICD-10-CM | POA: Diagnosis not present

## 2016-10-25 DIAGNOSIS — K5730 Diverticulosis of large intestine without perforation or abscess without bleeding: Secondary | ICD-10-CM | POA: Diagnosis not present

## 2016-10-25 DIAGNOSIS — Z800 Family history of malignant neoplasm of digestive organs: Secondary | ICD-10-CM | POA: Diagnosis not present

## 2016-10-31 ENCOUNTER — Telehealth: Payer: Self-pay | Admitting: Obstetrics and Gynecology

## 2016-10-31 NOTE — Telephone Encounter (Signed)
Called and left a message for patient to call back to schedule a new patient doctor referral for cystocele. She requests a call back next week because she has house guests this week.

## 2016-11-07 NOTE — Telephone Encounter (Signed)
Called and left a message for patient to call back to schedule a new patient doctor referral. °

## 2016-11-11 ENCOUNTER — Other Ambulatory Visit: Payer: Self-pay | Admitting: *Deleted

## 2016-11-11 MED ORDER — LEVOTHYROXINE SODIUM 100 MCG PO TABS: 100.0000 ug | ORAL_TABLET | Freq: Every day | ORAL | 1 refills | Status: DC

## 2016-11-11 NOTE — Telephone Encounter (Signed)
Pt changed pharmacies, Rx sent

## 2016-11-23 DIAGNOSIS — M25462 Effusion, left knee: Secondary | ICD-10-CM | POA: Diagnosis not present

## 2016-11-23 DIAGNOSIS — M1712 Unilateral primary osteoarthritis, left knee: Secondary | ICD-10-CM | POA: Diagnosis not present

## 2016-12-08 ENCOUNTER — Emergency Department (HOSPITAL_COMMUNITY): Payer: Medicare HMO

## 2016-12-08 ENCOUNTER — Other Ambulatory Visit: Payer: Self-pay

## 2016-12-08 ENCOUNTER — Observation Stay (HOSPITAL_COMMUNITY)
Admission: EM | Admit: 2016-12-08 | Discharge: 2016-12-10 | Disposition: A | Discharge status: Home / Self Care | Payer: Medicare HMO | Attending: Internal Medicine | Admitting: Internal Medicine

## 2016-12-08 ENCOUNTER — Ambulatory Visit: Payer: Self-pay | Admitting: *Deleted

## 2016-12-08 ENCOUNTER — Encounter (HOSPITAL_COMMUNITY): Payer: Self-pay

## 2016-12-08 DIAGNOSIS — J189 Pneumonia, unspecified organism: Secondary | ICD-10-CM | POA: Diagnosis not present

## 2016-12-08 DIAGNOSIS — I350 Nonrheumatic aortic (valve) stenosis: Secondary | ICD-10-CM | POA: Diagnosis not present

## 2016-12-08 DIAGNOSIS — M1712 Unilateral primary osteoarthritis, left knee: Secondary | ICD-10-CM | POA: Diagnosis not present

## 2016-12-08 DIAGNOSIS — E785 Hyperlipidemia, unspecified: Secondary | ICD-10-CM | POA: Diagnosis present

## 2016-12-08 DIAGNOSIS — D649 Anemia, unspecified: Secondary | ICD-10-CM | POA: Diagnosis not present

## 2016-12-08 DIAGNOSIS — Z23 Encounter for immunization: Secondary | ICD-10-CM | POA: Insufficient documentation

## 2016-12-08 DIAGNOSIS — R0789 Other chest pain: Secondary | ICD-10-CM | POA: Diagnosis not present

## 2016-12-08 DIAGNOSIS — Z87891 Personal history of nicotine dependence: Secondary | ICD-10-CM | POA: Diagnosis not present

## 2016-12-08 DIAGNOSIS — E039 Hypothyroidism, unspecified: Secondary | ICD-10-CM

## 2016-12-08 DIAGNOSIS — I340 Nonrheumatic mitral (valve) insufficiency: Secondary | ICD-10-CM | POA: Diagnosis not present

## 2016-12-08 DIAGNOSIS — Z79899 Other long term (current) drug therapy: Secondary | ICD-10-CM | POA: Diagnosis not present

## 2016-12-08 DIAGNOSIS — R072 Precordial pain: Secondary | ICD-10-CM

## 2016-12-08 DIAGNOSIS — I361 Nonrheumatic tricuspid (valve) insufficiency: Secondary | ICD-10-CM | POA: Diagnosis not present

## 2016-12-08 DIAGNOSIS — D509 Iron deficiency anemia, unspecified: Secondary | ICD-10-CM | POA: Insufficient documentation

## 2016-12-08 DIAGNOSIS — R079 Chest pain, unspecified: Secondary | ICD-10-CM | POA: Diagnosis not present

## 2016-12-08 DIAGNOSIS — I071 Rheumatic tricuspid insufficiency: Secondary | ICD-10-CM

## 2016-12-08 HISTORY — DX: Nonrheumatic mitral (valve) insufficiency: I34.0

## 2016-12-08 HISTORY — DX: Anxiety disorder, unspecified: F41.9

## 2016-12-08 HISTORY — DX: Rheumatoid arthritis, unspecified: M06.9

## 2016-12-08 HISTORY — DX: Reaction to severe stress, unspecified: F43.9

## 2016-12-08 HISTORY — DX: Nonrheumatic aortic (valve) stenosis: I35.0

## 2016-12-08 HISTORY — DX: Essential (primary) hypertension: I10

## 2016-12-08 HISTORY — DX: Rheumatic tricuspid insufficiency: I07.1

## 2016-12-08 HISTORY — DX: Malignant neoplasm of unspecified site of left female breast: C50.912

## 2016-12-08 LAB — IRON AND TIBC
Iron: 29 ug/dL (ref 28–170)
Saturation Ratios: 8 % — ABNORMAL LOW (ref 10.4–31.8)
TIBC: 370 ug/dL (ref 250–450)
UIBC: 341 ug/dL

## 2016-12-08 LAB — CBC
HCT: 33.6 % — ABNORMAL LOW (ref 36.0–46.0)
Hemoglobin: 10.7 g/dL — ABNORMAL LOW (ref 12.0–15.0)
MCH: 28.5 pg (ref 26.0–34.0)
MCHC: 31.8 g/dL (ref 30.0–36.0)
MCV: 89.6 fL (ref 78.0–100.0)
Platelets: 144 10*3/uL — ABNORMAL LOW (ref 150–400)
RBC: 3.75 MIL/uL — ABNORMAL LOW (ref 3.87–5.11)
RDW: 13.6 % (ref 11.5–15.5)
WBC: 9.3 10*3/uL (ref 4.0–10.5)

## 2016-12-08 LAB — BASIC METABOLIC PANEL
Anion gap: 7 (ref 5–15)
BUN: 12 mg/dL (ref 6–20)
CO2: 25 mmol/L (ref 22–32)
Calcium: 8.9 mg/dL (ref 8.9–10.3)
Chloride: 107 mmol/L (ref 101–111)
Creatinine, Ser: 0.98 mg/dL (ref 0.44–1.00)
GFR calc Af Amer: 58 mL/min — ABNORMAL LOW (ref >60)
GFR calc non Af Amer: 50 mL/min — ABNORMAL LOW (ref >60)
Glucose, Bld: 106 mg/dL — ABNORMAL HIGH (ref 65–99)
Potassium: 3.4 mmol/L — ABNORMAL LOW (ref 3.5–5.1)
Sodium: 139 mmol/L (ref 135–145)

## 2016-12-08 LAB — HEMOGLOBIN A1C
Hgb A1c MFr Bld: 5.9 % — ABNORMAL HIGH (ref 4.8–5.6)
Mean Plasma Glucose: 122.63 mg/dL

## 2016-12-08 LAB — LIPID PANEL
Cholesterol: 264 mg/dL — ABNORMAL HIGH (ref 0–200)
HDL: 76 mg/dL (ref >40)
LDL Cholesterol: 173 mg/dL — ABNORMAL HIGH (ref 0–99)
Total CHOL/HDL Ratio: 3.5 RATIO
Triglycerides: 74 mg/dL (ref <150)
VLDL: 15 mg/dL (ref 0–40)

## 2016-12-08 LAB — TROPONIN I: Troponin I: <0.03 ng/mL (ref <0.03)

## 2016-12-08 LAB — I-STAT TROPONIN, ED: Troponin i, poc: 0 ng/mL (ref 0.00–0.08)

## 2016-12-08 LAB — FERRITIN: Ferritin: 15 ng/mL (ref 11–307)

## 2016-12-08 LAB — PROCALCITONIN: Procalcitonin: <0.1 ng/mL

## 2016-12-08 LAB — FOLATE: Folate: 27 ng/mL (ref >5.9)

## 2016-12-08 LAB — RETICULOCYTES
RBC.: 3.86 MIL/uL — ABNORMAL LOW (ref 3.87–5.11)
Retic Count, Absolute: 19.3 10*3/uL (ref 19.0–186.0)
Retic Ct Pct: 0.5 % (ref 0.4–3.1)

## 2016-12-08 LAB — TSH: TSH: 5.821 u[IU]/mL — ABNORMAL HIGH (ref 0.350–4.500)

## 2016-12-08 LAB — VITAMIN B12: Vitamin B-12: 362 pg/mL (ref 180–914)

## 2016-12-08 MED ORDER — NITROGLYCERIN 0.4 MG SL SUBL
0.4000 mg | SUBLINGUAL_TABLET | SUBLINGUAL | Status: DC | PRN
  Administered 2016-12-09: 0.4 mg via SUBLINGUAL
  Filled 2016-12-08: qty 1

## 2016-12-08 MED ORDER — AZITHROMYCIN 250 MG PO TABS: 500.0000 mg | ORAL_TABLET | Freq: Every day | ORAL | Status: DC

## 2016-12-08 MED ORDER — AZITHROMYCIN 250 MG PO TABS: 500.0000 mg | ORAL_TABLET | Freq: Once | ORAL | Status: DC

## 2016-12-08 MED ORDER — AZITHROMYCIN 250 MG PO TABS: 250.0000 mg | ORAL_TABLET | Freq: Every day | ORAL | Status: DC

## 2016-12-08 MED ORDER — ONDANSETRON HCL 4 MG PO TABS: 4.0000 mg | ORAL_TABLET | Freq: Four times a day (QID) | ORAL | Status: DC | PRN

## 2016-12-08 MED ORDER — ENOXAPARIN SODIUM 40 MG/0.4ML ~~LOC~~ SOLN
40.0000 mg | SUBCUTANEOUS | Status: DC
  Administered 2016-12-08: 40 mg via SUBCUTANEOUS
  Filled 2016-12-08 (×2): qty 0.4

## 2016-12-08 MED ORDER — POTASSIUM CHLORIDE CRYS ER 20 MEQ PO TBCR
40.0000 meq | EXTENDED_RELEASE_TABLET | Freq: Once | ORAL | Status: AC
  Administered 2016-12-08: 40 meq via ORAL
  Filled 2016-12-08: qty 2

## 2016-12-08 MED ORDER — DEXTROSE 5 % IV SOLN: 1.0000 g | Freq: Once | INTRAVENOUS | Status: DC

## 2016-12-08 MED ORDER — INFLUENZA VAC SPLIT HIGH-DOSE 0.5 ML IM SUSY
0.5000 mL | PREFILLED_SYRINGE | INTRAMUSCULAR | Status: AC
  Administered 2016-12-09: 0.5 mL via INTRAMUSCULAR
  Filled 2016-12-08 (×2): qty 0.5

## 2016-12-08 MED ORDER — LEVOTHYROXINE SODIUM 100 MCG PO TABS
100.0000 ug | ORAL_TABLET | Freq: Every day | ORAL | Status: DC
  Administered 2016-12-09 – 2016-12-10 (×2): 100 ug via ORAL
  Filled 2016-12-08 (×2): qty 1

## 2016-12-08 MED ORDER — AZITHROMYCIN 250 MG PO TABS
250.0000 mg | ORAL_TABLET | Freq: Every day | ORAL | Status: DC
  Administered 2016-12-09 – 2016-12-10 (×2): 250 mg via ORAL
  Filled 2016-12-08 (×2): qty 1

## 2016-12-08 MED ORDER — HYDROCODONE-ACETAMINOPHEN 5-325 MG PO TABS
1.0000 | ORAL_TABLET | ORAL | Status: DC | PRN
  Administered 2016-12-08 – 2016-12-09 (×5): 1 via ORAL
  Administered 2016-12-09 – 2016-12-10 (×2): 2 via ORAL
  Filled 2016-12-08 (×4): qty 1
  Filled 2016-12-08 (×2): qty 2
  Filled 2016-12-08: qty 1

## 2016-12-08 MED ORDER — POLYETHYLENE GLYCOL 3350 17 G PO PACK
17.0000 g | PACK | Freq: Every day | ORAL | Status: DC
  Administered 2016-12-10: 17 g via ORAL
  Filled 2016-12-08 (×2): qty 1

## 2016-12-08 MED ORDER — ASPIRIN 81 MG PO CHEW: 324.0000 mg | CHEWABLE_TABLET | Freq: Once | ORAL | Status: DC

## 2016-12-08 MED ORDER — ENSURE ENLIVE PO LIQD
237.0000 mL | Freq: Two times a day (BID) | ORAL | Status: DC
  Administered 2016-12-09 – 2016-12-10 (×3): 237 mL via ORAL

## 2016-12-08 MED ORDER — ONDANSETRON HCL 4 MG/2ML IJ SOLN: 4.0000 mg | Freq: Four times a day (QID) | INTRAMUSCULAR | Status: DC | PRN

## 2016-12-08 NOTE — Telephone Encounter (Signed)
She called c/o sharp pain and pressure under left breast area.  She was speaking in half sentences and stopping to catch her breath.  "It really hurts"  I instructed her to call 911 immediately which she assured me she would do right now. Reason for Disposition . [1] Chest pain lasts > 5 minutes AND [2] age > 37  Answer Assessment - Initial Assessment Questions 1. LOCATION: "Where does it hurt?"       Under my left breast.   2. RADIATION: "Does the pain go anywhere else?" (e.g., into neck, jaw, arms, back)     No 3. ONSET: "When did the chest pain begin?" (Minutes, hours or days)      This morning 4. PATTERN "Does the pain come and go, or has it been constant since it started?"  "Does it get worse with exertion?"      Yes 5. DURATION: "How long does it last" (e.g., seconds, minutes, hours)     Since this morning. 6. SEVERITY: "How bad is the pain?"  (e.g., Scale 1-10; mild, moderate, or severe)    - MILD (1-3): doesn't interfere with normal activities     - MODERATE (4-7): interferes with normal activities or awakens from sleep    - SEVERE (8-10): excruciating pain, unable to do any normal activities       8 7. CARDIAC RISK FACTORS: "Do you have any history of heart problems or risk factors for heart disease?" (e.g., prior heart attack, angina; high blood pressure, diabetes, being overweight, high cholesterol, smoking, or strong family history of heart disease)     No 8. PULMONARY RISK FACTORS: "Do you have any history of lung disease?"  (e.g., blood clots in lung, asthma, emphysema, birth control pills)     No 9. CAUSE: "What do you think is causing the chest pain?"    I'm not sure 10. OTHER SYMPTOMS: "Do you have any other symptoms?" (e.g., dizziness, nausea, vomiting, sweating, fever, difficulty breathing, cough)       Difficulty breathing with a lot of pressure in my chest 11. PREGNANCY: "Is there any chance you are pregnant?" "When was your last menstrual period?"       Not  asked  Protocols used: CHEST PAIN-A-AH

## 2016-12-08 NOTE — H&P (Signed)
History and Physical    Dawn Aguirre:[id number] DOB: 07/14/1925  DOA: 12/08/2016 PCP: [name], MD  Patient coming from: Home   Chief Complaint: chest pain   HPI: Dawn Aguirre is a 83 y.o. female with medical history significant of hypothyroidism, Raynaud's syndromes, chronic arthritis and hyperlipidemia.  Patient presented to the emergency department complaining of left-sided chest pain that started 2 days ago.  Patient described pain as heavy midsternal to left discomfort, unsure radiation to the neck but sometimes feels some pain at the neck.  Associated symptoms include shortness of breath, cough and dizziness.  Patient reported pain is on and off but mainly at night.  Denies diaphoresis, palpitations, heartburn, nausea and vomiting.  Regarding cough this started yesterday nonproductive, patient denies any fever.  Patient reports today pain was unbearable call her PCP and was advised to come to the ED due to cardiac concerns  ED Course: EKG with nonspecific STT abnormalities, initial troponin negative, chest x-ray left lung base opacity specious for atelectasis versus pneumonia.  K 3.4, hemoglobin 10.7  Review of Systems:   General: + decrease in energy.  HEENT: no blurry vision, hearing changes or sore throat Respiratory: See HPI  CV: See HPI  GI: no nausea, vomiting, abdominal pain, diarrhea, constipation GU: no dysuria, burning on urination, increased urinary frequency, hematuria  Ext:. No deformities, + knee pain  Neuro: no unilateral weakness, numbness, or tingling, no vision change or hearing loss Skin: No rashes, lesions or wounds. MSK: No muscle spasm, no deformity Heme: No easy bruising.  Travel history: No recent long distant travel.   Past Medical History:  Diagnosis Date  . Acquired absence of organ, genital organs   . Breast cancer ([place])    surgery at [facility]  . Chronic airway obstruction, not elsewhere classified   . Colitis, ischemic ([place])  07/2000   ? infect  . Cough   . Degeneration of cervical intervertebral disc   . Diffuse cystic mastopathy   . Diverticulosis of colon (without mention of hemorrhage)   . Esophageal reflux   . External hemorrhoids without mention of complication   . Fibula fracture 2006  . Flatulence, eructation, and gas pain   . Hyperlipidemia   . Lichen sclerosus    back/abd/vulvar  . Mastoiditis of right side 04/2005   admitted  . Osteoporosis, unspecified   . Other acute sinusitis   . Other and unspecified hyperlipidemia   . Other dyspnea and respiratory abnormality   . Personal history of tobacco use, presenting hazards to health   . Predominant disturbance of emotions   . Raynaud's syndrome   . Rheumatoid arthritis(714.0)    Sero-negative  . Spinal stenosis   . Unspecified essential hypertension   . Unspecified gastritis and gastroduodenitis without mention of hemorrhage   . Unspecified hypothyroidism   . Wears dentures    top    Past Surgical History:  Procedure Laterality Date  . ABDOMINAL HYSTERECTOMY  1968  . Cardiollite  02/2002   Neg  . COLONOSCOPY    . DEXA  2001   OP,heel  . DEXA     OP hip T-2.94  . NASAL SINUS SURGERY  05/2002  . Nuclear Stress Test  11/07   Normal  . TONSILLECTOMY    . US ECHOCARDIOGRAPHY  10/06   normal LVF EF 55-65%     reports that she quit smoking about 54 years ago. She has never used smokeless tobacco. She reports that she drinks alcohol.  She reports that she does not use drugs.  Allergies  Allergen Reactions  . Ace Inhibitors     REACTION: cough  . Dm-Apap-Cpm Nausea Only  . Ezetimibe     REACTION: myalgia  . Ezetimibe-Simvastatin     REACTION: joints swell  . Macrobid [Nitrofurantoin]     ABD PAIN, ALSO BODY ACHES/PAIN  . Moxifloxacin   . Rosuvastatin     REACTION: myalgia  . Septra [Sulfamethoxazole-Trimethoprim]     Sick feeling   . Simvastatin     REACTION: myalgia  . Triaminic     Family History  Problem Relation Age  of Onset  . Heart attack Father 19  . Coronary artery disease Sister 58       CABG  . Heart attack Brother 96  . Coronary artery disease Unknown        GF    Prior to Admission medications   Medication Sig Start Date End Date Taking? Authorizing Provider  busPIRone (BUSPAR) 30 MG tablet Take 1 tablet (30 mg total) by mouth 2 (two) times daily. Patient taking differently: Take 30 mg by mouth as needed.  10/18/16  Yes Tower, Wynelle Fanny, MD  cyanocobalamin ([employer] VITAMIN B12) 2000 MCG tablet Take 2,000 mcg by mouth daily.   Yes [provider]  estradiol (ESTRACE) 0.1 MG/GM vaginal cream Place 1 Applicatorful vaginally at bedtime. 02/12/16  Yes [provider]  levothyroxine (SYNTHROID, LEVOTHROID) 100 MCG tablet Take 1 tablet (100 mcg total) by mouth daily. 11/11/16  Yes Tower, Wynelle Fanny, MD  Polyethylene Glycol 3350 (MIRALAX PO) Take 17 g by mouth as needed.    Yes [provider]  hydrOXYzine (ATARAX/VISTARIL) 25 MG tablet Take 1 tablet (25 mg total) by mouth 3 (three) times daily as needed. For severe itching, caution of sedation Patient not taking: Reported on 12/08/2016 06/29/16   Tower, Wynelle Fanny, MD  triamcinolone cream (KENALOG) 0.5 % Apply 1 application topically 3 (three) times daily as needed. Patient not taking: Reported on 12/08/2016 06/29/16   Tower, Wynelle Fanny, MD    Physical Exam: BP 141/91, HR 66, RR 17, O2 sat 97% RA   Constitutional: NAD, calm, comfortable Eyes: PERRL, L lid erythematous and swollen ENMT: Mucous membranes are moist. Posterior pharynx clear of any exudate or lesions Neck: normal, supple, no masses, Respiratory: clear to auscultation bilaterally, no wheezing, no crackles. Normal respiratory effort. No accessory muscle use.  Mild infra sternum tenderness Cardiovascular: RRR, Z6-X0, positive systolic murmur 2/6, no rubs / gallops. No extremity edema. 2+ pedal pulses.  Abdomen: no tenderness, no masses palpated. No hepatosplenomegaly. Bowel sounds  positive.  Musculoskeletal: Good ROM, no contractures. Normal muscle tone.  Skin: no rashes Neurologic: CN 2-12 grossly intact. Strength 5/5 in all 4.  Psychiatric: Normal judgment and insight. Alert and oriented x 3. Normal mood.    Labs on Admission: I have personally reviewed following labs and imaging studies  CBC:  Recent Labs Lab 12/08/16 1558  WBC 9.3  HGB 10.7*  HCT 33.6*  MCV 89.6  PLT 960*   Basic Metabolic Panel:  Recent Labs Lab 12/08/16 1558  NA 139  K 3.4*  CL 107  CO2 25  GLUCOSE 106*  BUN 12  CREATININE 0.98  CALCIUM 8.9   GFR: CrCl cannot be calculated (Unknown ideal weight.). Liver Function Tests: No results for input(s): AST, ALT, ALKPHOS, BILITOT, PROT, ALBUMIN in the last 168 hours. No results for input(s): LIPASE, AMYLASE in the last 168 hours. No results  for input(s): AMMONIA in the last 168 hours. Coagulation Profile: No results for input(s): INR, PROTIME in the last 168 hours. Cardiac Enzymes: No results for input(s): CKTOTAL, CKMB, CKMBINDEX, TROPONINI in the last 168 hours. BNP (last 3 results) No results for input(s): PROBNP in the last 8760 hours. HbA1C: No results for input(s): HGBA1C in the last 72 hours. CBG: No results for input(s): GLUCAP in the last 168 hours. Lipid Profile: No results for input(s): CHOL, HDL, LDLCALC, TRIG, CHOLHDL, LDLDIRECT in the last 72 hours. Thyroid Function Tests: No results for input(s): TSH, T4TOTAL, FREET4, T3FREE, THYROIDAB in the last 72 hours. Anemia Panel: No results for input(s): VITAMINB12, FOLATE, FERRITIN, TIBC, IRON, RETICCTPCT in the last 72 hours. Urine analysis:    Component Value Date/Time   COLORURINE YELLOW 02/17/2013 1243   APPEARANCEUR CLOUDY (A) 02/17/2013 1243   LABSPEC 1.011 02/17/2013 1243   PHURINE 5.0 02/17/2013 1243   GLUCOSEU NEGATIVE 02/17/2013 1243   HGBUR MODERATE (A) 02/17/2013 1243   BILIRUBINUR neg 09/09/2016 0920   KETONESUR NEGATIVE 02/17/2013 1243    PROTEINUR neg 09/09/2016 0920   PROTEINUR NEGATIVE 02/17/2013 1243   UROBILINOGEN 0.2 09/09/2016 0920   UROBILINOGEN 0.2 02/17/2013 1243   NITRITE neg 09/09/2016 0920   NITRITE NEGATIVE 02/17/2013 1243   LEUKOCYTESUR Negative 09/09/2016 0920   Sepsis Labs: !!!!!!!!!!!!!!!!!!!!!!!!!!!!!!!!!!!!!!!!!!!! @LABRCNTIP (procalcitonin:4,lacticidven:4) )No results found for this or any previous visit (from the past 240 hour(s)).   Radiological Exams on Admission: Dg Chest 2 View  Result Date: 12/08/2016 CLINICAL DATA:  83 y/o  F; mid and left-sided chest pain. EXAM: CHEST  2 VIEW COMPARISON:  12/23/2015 chest radiograph FINDINGS: Stable normal cardiac silhouette given projection and technique. Aortic atherosclerosis with calcification. Streaky left lung base opacity. No pleural effusion or pneumothorax. No acute osseous abnormality is evident. Surgical clips project over the left breast. IMPRESSION: Streaky left lung base opacity may represent atelectasis or pneumonia. Aortic atherosclerosis. Electronically Signed   By: Kristine Garbe M.D.   On: 12/08/2016 16:24    EKG: Independently reviewed. Normal sinus rhythm, heart rate at 80 bpm, nonspecific ST T changes in the lateral leads, PVCs  Assessment/Plan Chest pain - r/o ACS, Heart score 4 - Age, risk factor HLD, EKG with NSSTT changes, possible CAP contributing to chest pain. Chest pain with mix of atypical and typical features  Will place in observation   Trend EKG and TNI  Will order ECHO, if negative can DC home in AM with outpatient follow-up Get Lipid panel  Nitro and ASA  Pain control as needed  CAP - concern for pneumonia on CXR Cough, chest pain and some difficulty breathing  Treated with Rocephin and Azithro in ED, will continue Azithromycin alone, as patient is low risk Will obtain procalcitonin   Continue to monitor   Anemia unclear etiology  No over bleeding  Check anemia panel  Monitor CBC in AM    Hypothyroidism Check TSH Continue Synthroid  DVT prophylaxis: Lovenox  Code Status:  Full Code  Family Communication: Husband bedside Disposition Plan: Anticipate discharge to previous home environment.  Consults called: None Admission status: Telemetry/Obs    Chipper Oman MD [facility] Pager: Text Page via [email or web address]  [phone]  If 7PM-7AM, please contact night-coverage [email or web address] Password [facility]  12/08/2016, 6:29 PM

## 2016-12-08 NOTE — Telephone Encounter (Signed)
Aware-she is in the ED now

## 2016-12-08 NOTE — ED Provider Notes (Signed)
[facility] EMERGENCY DEPARTMENT Provider Note   CSN: [id number] Arrival date & time: 12/08/16  1521     History   Chief Complaint Chief Complaint  Patient presents with  . Chest Pain    HPI Dawn Aguirre is a 83 y.o. female.  HPI  Patient with history of RA, Raunaud's comes in with cc of chest pain. Patient reports that she has been having some chest discomfort on the left side for 2 days.  Patient's chest pain is described as midsternal, heaviness type pain, with some discomfort over the neck as well.  Patient has associated shortness of breath.  Her pain is worse with deep inspiration as well.  Patient denies any associated nausea, diaphoresis.  Patient reports that yesterday she started having cough which is nonproductive. Pt denies any fevers. Pt has no hx of PE, DVT and denies any exogenous hormone (testosterone / estrogen) use, long distance travels or surgery in the past 6 weeks, active cancer, recent immobilization. Pt denies any recent cardiac workup.  Past Medical History:  Diagnosis Date  . Acquired absence of organ, genital organs   . Breast cancer ([place])    surgery at [facility]  . Chronic airway obstruction, not elsewhere classified   . Colitis, ischemic ([place]) 07/2000   ? infect  . Cough   . Degeneration of cervical intervertebral disc   . Diffuse cystic mastopathy   . Diverticulosis of colon (without mention of hemorrhage)   . Esophageal reflux   . External hemorrhoids without mention of complication   . Fibula fracture 2006  . Flatulence, eructation, and gas pain   . Hyperlipidemia   . Lichen sclerosus    back/abd/vulvar  . Mastoiditis of right side 04/2005   admitted  . Osteoporosis, unspecified   . Other acute sinusitis   . Other and unspecified hyperlipidemia   . Other dyspnea and respiratory abnormality   . Personal history of tobacco use, presenting hazards to health   . Predominant disturbance of emotions   . Raynaud's syndrome     . Rheumatoid arthritis(714.0)    Sero-negative  . Spinal stenosis   . Unspecified essential hypertension   . Unspecified gastritis and gastroduodenitis without mention of hemorrhage   . Unspecified hypothyroidism   . Wears dentures    top    Patient Active Problem List   Diagnosis Date Noted  . Chronic abdominal pain 10/18/2016  . Urine frequency 09/09/2016  . Malaise and fatigue 09/09/2016  . Rash 06/13/2016  . Sleep disorder 01/13/2016  . Female cystocele 12/30/2015  . Left shoulder pain 09/02/2015  . Neck pain 09/02/2015  . Routine general medical examination at a health care facility 07/22/2015  . Hearing loss 07/22/2015  . Hyperglycemia 07/22/2015  . Anxiety disorder 04/24/2015  . Tremor 12/20/2014  . Chronic pain 10/14/2014  . B12 deficiency 07/16/2014  . Vitamin D deficiency 07/16/2014  . Fatigue 07/04/2014  . Encounter for Medicare annual wellness exam 01/13/2014  . Fall in home 12/24/2013  . History of breast cancer 12/18/2013  . Epiploic appendagitis 02/22/2013  . Dry mouth 02/04/2013  . Chest wall pain 12/25/2012  . Benign paroxysmal positional vertigo 09/14/2012  . Low back pain 09/07/2012  . Constipation 11/04/2011  . Aortic stenosis 08/31/2010  . Hyperlipidemia   . STRESS REACTION, ACUTE, WITH EMOTIONAL DISTURBANCE 10/12/2007  . [place] DISEASE, CERVICAL 10/12/2007  . Hypothyroidism 01/22/2007  . RAYNAUD'S SYNDROME 01/22/2007  . EXTERNAL HEMORRHOIDS 01/22/2007  . DIVERTICULOSIS, COLON 01/22/2007  .  FIBROCYSTIC BREAST DISEASE 01/22/2007  . Osteoporosis 01/22/2007  . Essential hypertension 12/16/2006  . GERD 12/16/2006  . DYSPNEA ON EXERTION 12/16/2006  . Cough 12/16/2006  . TOBACCO ABUSE, HX OF 12/16/2006  . HYSTERECTOMY, HX OF 12/16/2006    Past Surgical History:  Procedure Laterality Date  . ABDOMINAL HYSTERECTOMY  1968  . Cardiollite  02/2002   Neg  . COLONOSCOPY    . DEXA  2001   OP,heel  . DEXA     OP hip T-2.94  . NASAL SINUS SURGERY   05/2002  . Nuclear Stress Test  11/07   Normal  . TONSILLECTOMY    . US ECHOCARDIOGRAPHY  10/06   normal LVF EF 55-65%    OB History    No data available       Home Medications    Prior to Admission medications   Medication Sig Start Date End Date Taking? Authorizing Provider  busPIRone (BUSPAR) 30 MG tablet Take 1 tablet (30 mg total) by mouth 2 (two) times daily. Patient taking differently: Take 30 mg by mouth as needed.  10/18/16  Yes Tower, Wynelle Fanny, MD  cyanocobalamin ([employer] VITAMIN B12) 2000 MCG tablet Take 2,000 mcg by mouth daily.   Yes [provider]  estradiol (ESTRACE) 0.1 MG/GM vaginal cream Place 1 Applicatorful vaginally at bedtime. 02/12/16  Yes [provider]  levothyroxine (SYNTHROID, LEVOTHROID) 100 MCG tablet Take 1 tablet (100 mcg total) by mouth daily. 11/11/16  Yes Tower, Wynelle Fanny, MD  Polyethylene Glycol 3350 (MIRALAX PO) Take 17 g by mouth as needed.    Yes [provider]  hydrOXYzine (ATARAX/VISTARIL) 25 MG tablet Take 1 tablet (25 mg total) by mouth 3 (three) times daily as needed. For severe itching, caution of sedation Patient not taking: Reported on 12/08/2016 06/29/16   Tower, Wynelle Fanny, MD  triamcinolone cream (KENALOG) 0.5 % Apply 1 application topically 3 (three) times daily as needed. Patient not taking: Reported on 12/08/2016 06/29/16   [name], MD    Family History Family History  Problem Relation Age of Onset  . Heart attack Father 31  . Coronary artery disease Sister 60       CABG  . Heart attack Brother 80  . Coronary artery disease Unknown        GF    Social History Social History  Substance Use Topics  . Smoking status: Former Smoker    Quit date: 02/07/1962  . Smokeless tobacco: Never Used  . Alcohol use 0.0 oz/week     Comment: occ wine     Allergies   Ace inhibitors; Dm-apap-cpm; Ezetimibe; Ezetimibe-simvastatin; Macrobid [nitrofurantoin]; Moxifloxacin; Rosuvastatin; Septra  [sulfamethoxazole-trimethoprim]; Simvastatin; and Triaminic   Review of Systems Review of Systems  Constitutional: Positive for activity change.  Respiratory: Positive for cough and shortness of breath.   Cardiovascular: Positive for chest pain.  Musculoskeletal: Positive for neck pain.  All other systems reviewed and are negative.    Physical Exam Updated Vital Signs There were no vitals taken for this visit.  Physical Exam  Constitutional: She is oriented to person, place, and time. She appears well-developed.  HENT:  Head: Normocephalic and atraumatic.  Eyes: EOM are normal.  Neck: Normal range of motion. Neck supple.  Cardiovascular: Normal rate.   Pulmonary/Chest: Effort normal. No respiratory distress. She has no wheezes. She has no rales.  Abdominal: Bowel sounds are normal.  Musculoskeletal: She exhibits no edema or tenderness.  Neurological: She is alert and oriented to  person, place, and time.  Skin: Skin is warm and dry.  Nursing note and vitals reviewed.    ED Treatments / Results  Labs (all labs ordered are listed, but only abnormal results are displayed) Labs Reviewed  BASIC METABOLIC PANEL - Abnormal; Notable for the following:       Result Value   Potassium 3.4 (*)    Glucose, Bld 106 (*)    GFR calc non Af Amer 50 (*)    GFR calc Af Amer 58 (*)    All other components within normal limits  CBC - Abnormal; Notable for the following:    RBC 3.75 (*)    Hemoglobin 10.7 (*)    HCT 33.6 (*)    Platelets 144 (*)    All other components within normal limits  I-STAT TROPONIN, ED    EKG  EKG Interpretation  Date/Time:  Thursday December 08 2016 15:32:33 EDT Ventricular Rate:  80 PR Interval:    QRS Duration: 94 QT Interval:  396 QTC Calculation: 457 R Axis:   -28 Text Interpretation:  Sinus rhythm Ventricular trigeminy Borderline left axis deviation No acute changes No significant change since last tracing Nonspecific ST depression in lateral  leads Confirmed by Varney Biles [phone]) on 12/08/2016 4:05:52 PM Also confirmed by Varney Biles [phone]), editor Philomena Doheny [phone])  on 12/08/2016 4:29:59 PM       Radiology Dg Chest 2 View  Result Date: 12/08/2016 CLINICAL DATA:  83 y/o  F; mid and left-sided chest pain. EXAM: CHEST  2 VIEW COMPARISON:  12/23/2015 chest radiograph FINDINGS: Stable normal cardiac silhouette given projection and technique. Aortic atherosclerosis with calcification. Streaky left lung base opacity. No pleural effusion or pneumothorax. No acute osseous abnormality is evident. Surgical clips project over the left breast. IMPRESSION: Streaky left lung base opacity may represent atelectasis or pneumonia. Aortic atherosclerosis. Electronically Signed   By: Kristine Garbe M.D.   On: 12/08/2016 16:24    Procedures Procedures (including critical care time)  Medications Ordered in ED Medications  cefTRIAXone (ROCEPHIN) 1 g in dextrose 5 % 50 mL IVPB (not administered)  azithromycin (ZITHROMAX) tablet 500 mg (not administered)     Initial Impression / Assessment and Plan / ED Course  I have reviewed the triage vital signs and the nursing notes.  Pertinent labs & imaging results that were available during my care of the patient were reviewed by me and considered in my medical decision making (see chart for details).     Patient comes in with left-sided chest heaviness.  Chest heaviness is associated with some cough and pleuritic type chest pain.  Patient has no risk factors for PE and our pretest probability for PE is extremely low.  There is no evidence of DVT on her exam either.  Patient has a cough, however her lung exam do not reveal any focal abnormalities.  X-ray shows possibly a left-sided infiltrate.  We will start patient on treatment for community acquired pneumonia.  Patient's chest pain is described as heaviness, which is the only typical feature of the chest pain.  Although patient is 8  she does not have any significant cardiac risk factors.  EKG has no acute abnormalities.  HEAR score is 4 -  2 of it is from age 56 and 1 each for EKG and hx. patient is concerned about cardiac etiology for her chest pain, and was sent to the emergency room specifically by the primary care doctor for that purpose.  Initial troponin has  resulted and is normal.  Patient is still having about 4 out of 10 chest pain.  We will give her nitro and aspirin.  We will admit patient to the hospital for observation status, with the objective to rule out ACS and treat for the likely community acquired pneumonia.   Final Clinical Impressions(s) / ED Diagnoses   Final diagnoses:  Community acquired pneumonia of left lung, unspecified part of lung  Precordial chest pain    New Prescriptions New Prescriptions   No medications on file     Varney Biles, MD 12/08/16 1707

## 2016-12-08 NOTE — ED Triage Notes (Signed)
Pt arrives to ED from home via EMS with complaints of left sided chest pain since waking up this morning. Pt states the pain decreased slightly to discomfort after EMS gave 2 nitro tabs and 324 of aspirin. Pt presents with purple discoloration over left eye, pt states she has not hit her head on anything, but woke up with it this morning. Pt placed in position of comfort with bed locked and lowered, call bell in reach. Husband bedside

## 2016-12-08 NOTE — ED Notes (Signed)
Pt reports L sided cp last night.  She endorses constant dull and heavy pain that radiates to her LUQ.  Pt reports she has a stiffneck which she wears a collar at night, but feels like the pain is moving down to her shoulders.  Pt denies any SOB, dizziness, or diaphoresis but reports that pain in her chest is worse when taking a deep breath.  Pt is A&O x 4.  Spouse is at bedside

## 2016-12-09 ENCOUNTER — Observation Stay (HOSPITAL_COMMUNITY): Payer: Medicare HMO

## 2016-12-09 ENCOUNTER — Encounter (HOSPITAL_COMMUNITY): Payer: Self-pay | Admitting: Physician Assistant

## 2016-12-09 ENCOUNTER — Observation Stay (HOSPITAL_BASED_OUTPATIENT_CLINIC_OR_DEPARTMENT_OTHER): Payer: Medicare HMO

## 2016-12-09 DIAGNOSIS — D649 Anemia, unspecified: Secondary | ICD-10-CM | POA: Diagnosis not present

## 2016-12-09 DIAGNOSIS — I071 Rheumatic tricuspid insufficiency: Secondary | ICD-10-CM

## 2016-12-09 DIAGNOSIS — I361 Nonrheumatic tricuspid (valve) insufficiency: Secondary | ICD-10-CM | POA: Diagnosis not present

## 2016-12-09 DIAGNOSIS — R072 Precordial pain: Secondary | ICD-10-CM | POA: Diagnosis not present

## 2016-12-09 DIAGNOSIS — Z23 Encounter for immunization: Secondary | ICD-10-CM | POA: Diagnosis not present

## 2016-12-09 DIAGNOSIS — I350 Nonrheumatic aortic (valve) stenosis: Secondary | ICD-10-CM

## 2016-12-09 DIAGNOSIS — I340 Nonrheumatic mitral (valve) insufficiency: Secondary | ICD-10-CM | POA: Diagnosis not present

## 2016-12-09 DIAGNOSIS — J189 Pneumonia, unspecified organism: Secondary | ICD-10-CM | POA: Diagnosis not present

## 2016-12-09 DIAGNOSIS — R079 Chest pain, unspecified: Secondary | ICD-10-CM | POA: Diagnosis not present

## 2016-12-09 DIAGNOSIS — I5030 Unspecified diastolic (congestive) heart failure: Secondary | ICD-10-CM

## 2016-12-09 DIAGNOSIS — D509 Iron deficiency anemia, unspecified: Secondary | ICD-10-CM | POA: Diagnosis not present

## 2016-12-09 DIAGNOSIS — E039 Hypothyroidism, unspecified: Secondary | ICD-10-CM | POA: Diagnosis not present

## 2016-12-09 DIAGNOSIS — Z79899 Other long term (current) drug therapy: Secondary | ICD-10-CM | POA: Diagnosis not present

## 2016-12-09 DIAGNOSIS — Z87891 Personal history of nicotine dependence: Secondary | ICD-10-CM | POA: Diagnosis not present

## 2016-12-09 DIAGNOSIS — E785 Hyperlipidemia, unspecified: Secondary | ICD-10-CM | POA: Diagnosis not present

## 2016-12-09 LAB — BASIC METABOLIC PANEL
Anion gap: 6 (ref 5–15)
BUN: 12 mg/dL (ref 6–20)
CO2: 23 mmol/L (ref 22–32)
Calcium: 8.4 mg/dL — ABNORMAL LOW (ref 8.9–10.3)
Chloride: 111 mmol/L (ref 101–111)
Creatinine, Ser: 0.96 mg/dL (ref 0.44–1.00)
GFR calc Af Amer: 59 mL/min — ABNORMAL LOW (ref >60)
GFR calc non Af Amer: 51 mL/min — ABNORMAL LOW (ref >60)
Glucose, Bld: 98 mg/dL (ref 65–99)
Potassium: 4.2 mmol/L (ref 3.5–5.1)
Sodium: 140 mmol/L (ref 135–145)

## 2016-12-09 LAB — ECHOCARDIOGRAM COMPLETE
Height: 63 in
Weight: 2217.6 oz

## 2016-12-09 LAB — CBC
HCT: 31.7 % — ABNORMAL LOW (ref 36.0–46.0)
Hemoglobin: 10.2 g/dL — ABNORMAL LOW (ref 12.0–15.0)
MCH: 28.7 pg (ref 26.0–34.0)
MCHC: 32.2 g/dL (ref 30.0–36.0)
MCV: 89 fL (ref 78.0–100.0)
Platelets: 128 10*3/uL — ABNORMAL LOW (ref 150–400)
RBC: 3.56 MIL/uL — ABNORMAL LOW (ref 3.87–5.11)
RDW: 13.5 % (ref 11.5–15.5)
WBC: 6.2 10*3/uL (ref 4.0–10.5)

## 2016-12-09 LAB — D-DIMER, QUANTITATIVE: D-Dimer, Quant: 1.1 ug/mL-FEU — ABNORMAL HIGH (ref 0.00–0.50)

## 2016-12-09 LAB — TROPONIN I
Troponin I: <0.03 ng/mL (ref <0.03)
Troponin I: <0.03 ng/mL (ref <0.03)

## 2016-12-09 MED ORDER — DEXTROSE 5 % IV SOLN
1.0000 g | INTRAVENOUS | Status: DC
  Administered 2016-12-09: 1 g via INTRAVENOUS
  Filled 2016-12-09 (×2): qty 10

## 2016-12-09 MED ORDER — SIMETHICONE 80 MG PO CHEW: 160.0000 mg | CHEWABLE_TABLET | Freq: Four times a day (QID) | ORAL | Status: DC | PRN

## 2016-12-09 MED ORDER — ALUM & MAG HYDROXIDE-SIMETH 200-200-20 MG/5ML PO SUSP
30.0000 mL | ORAL | Status: DC | PRN
  Administered 2016-12-09 – 2016-12-10 (×2): 30 mL via ORAL
  Filled 2016-12-09 (×2): qty 30

## 2016-12-09 MED ORDER — IOPAMIDOL (ISOVUE-370) INJECTION 76%
INTRAVENOUS | Status: AC
  Administered 2016-12-09: 100 mL
  Filled 2016-12-09: qty 100

## 2016-12-09 MED ORDER — SIMETHICONE 80 MG PO CHEW
80.0000 mg | CHEWABLE_TABLET | Freq: Four times a day (QID) | ORAL | Status: DC | PRN
  Administered 2016-12-09: 80 mg via ORAL
  Filled 2016-12-09: qty 1

## 2016-12-09 NOTE — Progress Notes (Signed)
Addendum: Ct chest with NO PE, LLL pneumonia and small effusion, suspect pleurisy from this -start IV Ceftriaxone today -Home tomorrow on Po Abx if stable  Domenic Polite MD

## 2016-12-09 NOTE — Care Management Obs Status (Signed)
[place] NOTIFICATION   Patient Details  Name: Dawn Aguirre MRN: [id number] Date of Birth: 20-Jan-1926   Medicare Observation Status Notification Given:  Yes    Arley Phenix, RN 12/09/2016, 2:15 PM

## 2016-12-09 NOTE — Progress Notes (Signed)
Patient complained of 7/10 left chest pain/indigestion.  Patient is burping often and passing moderate amount of gas.  1 sublingual nitroglycerin given at 0013, Maalox given at [place].  EKG completed.  Patient continues to burp often and complaining of gas and gas pains.  [facility] paged to update.

## 2016-12-09 NOTE — Care Management Note (Signed)
Case Management Note  Patient Details  Name: Dawn Aguirre MRN: [id number] Date of Birth: [birth year]  Subjective/Objective:                Pt presented for CP.  Pt independent from home.  Pt lives with husband and has family to assist as needed.  Pt does not use equipment at home.    Action/Plan: No CM needs at this time.  D/c anticipated today.    Expected Discharge Date:                  Expected Discharge Plan:  Home/Self Care  In-House Referral:  NA  Discharge planning Services  CM Consult  Post Acute Care Choice:  NA Choice offered to:  NA  DME Arranged:  N/A DME Agency:  NA  HH Arranged:  NA HH Agency:  NA  Status of Service:  Completed, signed off  If discussed at [place] of Stay Meetings, dates discussed:    Additional Comments:  Arley Phenix, RN 12/09/2016, 2:33 PM

## 2016-12-09 NOTE — Progress Notes (Signed)
PROGRESS NOTE    LADEJA PELHAM  JYN:[id number] DOB: 08-12-1925 DOA: 12/08/2016 PCP: [name], MD  Brief Narrative: 83 year old female with history of hypothyroidism, Renard syndrome, arthritis, dyslipidemia presented to the emergency room with left-sided chest pain for 2 days which is worse with movement and deep inspiration, patient reports a chronic cough for weeks which is nightly however no recent change in this, denies any fever. EKG nonspecific, chest x-ray atelectasis versus pneumonia   Assessment & Plan:    1. Atypical chest pain -Symptoms not concerning for cardiac etiology, troponin 3 negative, echo ordered will follow-up There is a pleuritic component to it, could be musculoskeletal versus pulmonary origin -Check a d-dimer, if abnormal will get CTA chest -If negative, NSAID trial  2. Pneumonia versus atelectasis on chest x-ray -No fevers or leukocytosis -Patient has a nightly cough for several weeks, pro-calcitonin negative -We will keep her on azithromycin for now pending above workup  3. Iron deficiency anemia -Defer further workup to PCP  4. Hypothyroidism -Continue Synthroid  DVT prophylaxis:Lovenox  Code Status: Full code  Family Communication:No family at bedside  Disposition Plan: Home pending above workup  Consultants:   cards   Procedures:   Antimicrobials: Azithromycin 11/1   Subjective: -Continues to have left-sided chest pain which is worse with inspiration and movement  Objective: Vitals:   12/09/16 0000 12/09/16 0019 12/09/16 0024 12/09/16 0520  BP: (!) 174/80 (!) 82/63 116/67 (!) 125/47  Pulse: 74 82 82 66  Resp:      Temp:    97.9 F (36.6 C)  TempSrc:    Oral  SpO2:    96%  Weight:    62.9 kg (138 lb 9.6 oz)  Height:        Intake/Output Summary (Last 24 hours) at 12/09/16 1329 Last data filed at 12/09/16 0256  Gross per 24 hour  Intake                0 ml  Output              100 ml  Net             -100 ml    Filed Weights   12/08/16 2116 12/09/16 0520  Weight: 62.3 kg (137 lb 6.4 oz) 62.9 kg (138 lb 9.6 oz)    Examination:  General exam: Appears calm and comfortable  Respiratory system: Clear to auscultation. Respiratory effort normal. Cardiovascular system: S1 & S2 heard, RRR.Systolic ejection murmur noted, rubs, gallops Gastrointestinal system: Abdomen is nondistended, soft and nontender.Normal bowel sounds heard. Central nervous system: Alert and oriented. No focal neurological deficits. Extremities: Symmetric 5 x 5 power, mild swelling in the left knee noted  Skin: No rashes, lesions or ulcers Psychiatry: Judgement and insight appear normal. Mood & affect appropriate.     Data Reviewed:   CBC:  Recent Labs Lab 12/08/16 1558 12/09/16 0557  WBC 9.3 6.2  HGB 10.7* 10.2*  HCT 33.6* 31.7*  MCV 89.6 89.0  PLT 144* 865*   Basic Metabolic Panel:  Recent Labs Lab 12/08/16 1558 12/09/16 0557  NA 139 140  K 3.4* 4.2  CL 107 111  CO2 25 23  GLUCOSE 106* 98  BUN 12 12  CREATININE 0.98 0.96  CALCIUM 8.9 8.4*   GFR: Estimated Creatinine Clearance: 35.5 mL/min (by C-G formula based on SCr of 0.96 mg/dL). Liver Function Tests: No results for input(s): AST, ALT, ALKPHOS, BILITOT, PROT, ALBUMIN in the last 168 hours. No results  for input(s): LIPASE, AMYLASE in the last 168 hours. No results for input(s): AMMONIA in the last 168 hours. Coagulation Profile: No results for input(s): INR, PROTIME in the last 168 hours. Cardiac Enzymes:  Recent Labs Lab 12/08/16 1845 12/08/16 2343 12/09/16 0557  TROPONINI <0.03 <0.03 <0.03   BNP (last 3 results) No results for input(s): PROBNP in the last 8760 hours. HbA1C:  Recent Labs  12/08/16 1845  HGBA1C 5.9*   CBG: No results for input(s): GLUCAP in the last 168 hours. Lipid Profile:  Recent Labs  12/08/16 1845  CHOL 264*  HDL 76  LDLCALC 173*  TRIG 74  CHOLHDL 3.5   Thyroid Function Tests:  Recent Labs   12/08/16 1900  TSH 5.821*   Anemia Panel:  Recent Labs  12/08/16 1845 12/08/16 1900  VITAMINB12  --  362  FOLATE  --  27.0  FERRITIN  --  15  TIBC  --  370  IRON  --  29  RETICCTPCT 0.5  --    Urine analysis:    Component Value Date/Time   COLORURINE YELLOW 02/17/2013 1243   APPEARANCEUR CLOUDY (A) 02/17/2013 1243   LABSPEC 1.011 02/17/2013 1243   PHURINE 5.0 02/17/2013 1243   GLUCOSEU NEGATIVE 02/17/2013 1243   HGBUR MODERATE (A) 02/17/2013 1243   BILIRUBINUR neg 09/09/2016 0920   KETONESUR NEGATIVE 02/17/2013 1243   PROTEINUR neg 09/09/2016 0920   PROTEINUR NEGATIVE 02/17/2013 1243   UROBILINOGEN 0.2 09/09/2016 0920   UROBILINOGEN 0.2 02/17/2013 1243   NITRITE neg 09/09/2016 0920   NITRITE NEGATIVE 02/17/2013 1243   LEUKOCYTESUR Negative 09/09/2016 0920   Sepsis Labs: @LABRCNTIP (procalcitonin:4,lacticidven:4)  )No results found for this or any previous visit (from the past 240 hour(s)).       Radiology Studies: Dg Chest 2 View  Result Date: 12/08/2016 CLINICAL DATA:  83 y/o  F; mid and left-sided chest pain. EXAM: CHEST  2 VIEW COMPARISON:  12/23/2015 chest radiograph FINDINGS: Stable normal cardiac silhouette given projection and technique. Aortic atherosclerosis with calcification. Streaky left lung base opacity. No pleural effusion or pneumothorax. No acute osseous abnormality is evident. Surgical clips project over the left breast. IMPRESSION: Streaky left lung base opacity may represent atelectasis or pneumonia. Aortic atherosclerosis. Electronically Signed   By: Kristine Garbe M.D.   On: 12/08/2016 16:24        Scheduled Meds: . aspirin  324 mg Oral Once  . azithromycin  250 mg Oral Daily  . enoxaparin (LOVENOX) injection  40 mg Subcutaneous Q24H  . feeding supplement (ENSURE ENLIVE)  237 mL Oral BID BM  . Influenza vac split quadrivalent PF  0.5 mL Intramuscular Tomorrow-1000  . levothyroxine  100 mcg Oral QAC breakfast  . polyethylene  glycol  17 g Oral Daily   Continuous Infusions:   LOS: 0 days    Time spent: 36min    Domenic Polite, MD [facility] Page via [email or web address], password [email or web address] After 7PM please contact night-coverage  12/09/2016, 1:29 PM

## 2016-12-09 NOTE — Consult Note (Signed)
Cardiology Consultation:   Patient ID: ODETTE WATANABE; [id number]; 12-Jun-1925   Admit date: 12/08/2016 Date of Consult: 12/09/2016  Primary Care Provider: [name], MD Primary Cardiologist: Dawn Aguirre  Chief Complaint: chest pain   Patient Profile:   Dawn Aguirre is a 83 y.o. female with a hx of mild-moderate aortic stenosis, mild mitral regurgitation, moderate tricuspid regurgitation in 2012, GERD, HTN, HLD (intolerant of multiple statins), hypothyroidism, diverticulosis, Raynaud's syndrome, RA (listed in chart but patient denies), lichen sclerosis, spinal stenosis, breast CA who is being seen today for the evaluation of chest pain at the request of Dawn Aguirre (identified as chest pain unit consult).  History of Present Illness:   She was Dawn evaluated in 2012 by Dr. Percival Aguirre for [facility]. 2D echo at that time showed mild LVH, EF 55-65%, grade 1 DD, mild-mod aortic stenosis, mild mitral regurgitation, moderate tricuspid regurgitation. Nuclear stress test 03/2011 was negative. She was supposed to follow-up after this but no further notes seen; the patient states this was a chapter of her life that came and went and she was able to continue on with her activity without any functional limitation.  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 He's now 109 years old and has done fairly well until recent health problems. 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 She has not had any recent issues with CP, SOB, nausea, diaphoresis or palpitations otherwise.  However, yesterday she got her knee injected for recent knee pain and about two hours later began noticing a sensation of chest pressure specifically anytime she took a deep breath. She  felt as though she was unable to inspire completely. She also has noticed a cough productive of phlegm for about a month that occurs specifically at night. No fevers, chills, syncope. Due to persistent "unbearable" pain she presented to the ER where she was found to have possible PNA. Blood pressure appears variable from 82/63 to 174/80. Labs show neg troponin x 4, LDL 173, Cr 0.96, K 3.4 -> repleted to 4.2, Hgb 10.2 with mild thrombocytopenia to 128 - prior hemoglobin was 11.7 in August). Otherwise not hypoxic or tachycardic. CXR with streaky L lung base ? atx vs PNA, aortic atherosclerosis. She was given 1 SL NTG as well as Maalox and hydrocodone. She feels better this AM - still notices mild discomfort with deep inspiration. No LEE. Has noticed mild BRBPR when she wipes.   Past Medical History:  Diagnosis Date  . Acquired absence of organ, genital organs   . Anxiety   . Cancer of left breast ([place])    surgery at [facility]  . Chronic airway obstruction, not elsewhere classified   . Colitis, ischemic ([facility]ir[facility]) 07/2000   ? infect  . Cough   . Degeneration of cervical intervertebral disc   . Diffuse cystic mastopathy   . Diverticulosis of colon (without mention of hemorrhage)   . Esophageal reflux   . Essential hypertension   . External hemorrhoids without mention of complication   . Fibula fracture 2006  . Flatulence, eructation, and gas pain   . Hyperlipidemia    a. intol of multiple statins.  . Lichen sclerosus    back/abd/vulvar  . Mastoiditis of right side 04/2005   admitted  . Osteoporosis, unspecified   .  Personal history of tobacco use, presenting hazards to health   . Predominant disturbance of emotions   . RA (rheumatoid arthritis) ([facility])    Sero-negative  . Raynaud's syndrome   . Spinal stenosis   . Stress at home    "caring for son who is disabled" (12/08/2016)  . Unspecified gastritis and gastroduodenitis without mention of hemorrhage   . Unspecified hypothyroidism   . Wears  dentures    top    Past Surgical History:  Procedure Laterality Date  . ABDOMINAL HYSTERECTOMY  1968  . BREAST BIOPSY Left   . BREAST LUMPECTOMY Left   . Cardiollite  02/2002   Neg  . CATARACT EXTRACTION W/ INTRAOCULAR LENS IMPLANT Right   . COLONOSCOPY    . COLONOSCOPY    . DEXA  2001   OP,heel  . DEXA     OP hip T-2.94  . NASAL SINUS SURGERY  10/2002   Dawn Aguirre 06/23/2010  . Nuclear Stress Test  11/07   Normal  . SHOULDER ARTHROSCOPY W/ ROTATOR CUFF REPAIR Right 06/2005   Dawn Aguirre 06/23/2010  . TONSILLECTOMY    . US ECHOCARDIOGRAPHY  10/06   normal LVF EF 55-65%     Inpatient Medications: Scheduled Meds: . aspirin  324 mg Oral Once  . azithromycin  250 mg Oral Daily  . enoxaparin (LOVENOX) injection  40 mg Subcutaneous Q24H  . feeding supplement (ENSURE ENLIVE)  237 mL Oral BID BM  . Influenza vac split quadrivalent PF  0.5 mL Intramuscular Tomorrow-1000  . levothyroxine  100 mcg Oral QAC breakfast  . polyethylene glycol  17 g Oral Daily   Continuous Infusions:  PRN Meds: alum & mag hydroxide-simeth, HYDROcodone-acetaminophen, nitroGLYCERIN, ondansetron **OR** ondansetron (ZOFRAN) IV, simethicone  Home Meds: Prior to Admission medications   Medication Sig Start Date End Date Taking? Authorizing Provider  busPIRone (BUSPAR) 30 MG tablet Take 1 tablet (30 mg total) by mouth 2 (two) times daily. Patient taking differently: Take 30 mg by mouth as needed.  10/18/16  Yes Tower, Wynelle Fanny, MD  cyanocobalamin ([employer] VITAMIN B12) 2000 MCG tablet Take 2,000 mcg by mouth daily.   Yes [provider]  estradiol (ESTRACE) 0.1 MG/GM vaginal cream Place 1 Applicatorful vaginally at bedtime. 02/12/16  Yes [provider]  levothyroxine (SYNTHROID, LEVOTHROID) 100 MCG tablet Take 1 tablet (100 mcg total) by mouth daily. 11/11/16  Yes Tower, Wynelle Fanny, MD  Polyethylene Glycol 3350 (MIRALAX PO) Take 17 g by mouth as needed.    Yes [provider]  hydrOXYzine  (ATARAX/VISTARIL) 25 MG tablet Take 1 tablet (25 mg total) by mouth 3 (three) times daily as needed. For severe itching, caution of sedation Patient not taking: Reported on 12/08/2016 06/29/16   Tower, Wynelle Fanny, MD  triamcinolone cream (KENALOG) 0.5 % Apply 1 application topically 3 (three) times daily as needed. Patient not taking: Reported on 12/08/2016 06/29/16   Dawn Greenspan, MD    Allergies:    Allergies  Allergen Reactions  . Ace Inhibitors     REACTION: cough  . Dm-Apap-Cpm Nausea Only  . Ezetimibe     REACTION: myalgia  . Ezetimibe-Simvastatin     REACTION: joints swell  . Macrobid [Nitrofurantoin]     ABD PAIN, ALSO BODY ACHES/PAIN  . Moxifloxacin   . Rosuvastatin     REACTION: myalgia  . Septra [Sulfamethoxazole-Trimethoprim]     Sick feeling   . Simvastatin     REACTION: myalgia  . Triaminic  Social History:   Social History   Social History  . Marital status: Married    Spouse name: N/A  . Number of children: 3  . Years of education: N/A   Occupational History  . Retired    Social History Main Topics  . Smoking status: Former Smoker    Packs/day: 2.00    Years: 10.00    Types: Cigarettes    Quit date: 02/07/1962  . Smokeless tobacco: Never Used  . Alcohol use 0.0 oz/week     Comment: 12/08/2016 "glass of wine a couple times/month"  . Drug use: No  . Sexual activity: Not Currently   Other Topics Concern  . Not on file   Social History Narrative   Retired   [employer]ots of [employer], cares for disabled son    Family History:   The patient's family history includes Coronary artery disease in her unknown relative; Coronary artery disease (age of onset: 33) in her sister; Heart attack (age of onset: 4) in her brother; Heart attack (age of onset: 55) in her father.  ROS:  Please see the history of present illness.  All other ROS reviewed and negative.     Physical Exam/Data:   Vitals:   12/09/16 0000 12/09/16 0019 12/09/16 0024  12/09/16 0520  BP: (!) 174/80 (!) 82/63 116/67 (!) 125/47  Pulse: 74 82 82 66  Resp:      Temp:    97.9 F (36.6 C)  TempSrc:    Oral  SpO2:    96%  Weight:    138 lb 9.6 oz (62.9 kg)  Height:        Intake/Output Summary (Last 24 hours) at 12/09/16 0918 Last data filed at 12/09/16 0256  Gross per 24 hour  Intake                0 ml  Output              100 ml  Net             -100 ml   Filed Weights   12/08/16 2116 12/09/16 0520  Weight: 137 lb 6.4 oz (62.3 kg) 138 lb 9.6 oz (62.9 kg)   Body mass index is 24.55 kg/m.  General: Well developed, well nourished WF, in no acute distress. Appears much younger than stated age Head: Normocephalic, atraumatic, sclera non-icteric, no xanthomas, nares are without discharge. Neck: Negative for carotid bruits. JVD not elevated. Lungs: Crackles left base, no wheezes or rhonchi. Breathing is unlabored. Heart: RRR 2/6 SEM RUSB, soft SEM LSB, preserved S2, no rubs or gallops appreciated. Abdomen: Soft, non-tender, non-distended with normoactive bowel sounds. No hepatomegaly. No rebound/guarding. No obvious abdominal masses. Msk:  Strength and tone appear normal for age. Extremities: No clubbing or cyanosis. No edema.  Distal pedal pulses are 2+ and equal bilaterally. Neuro: Alert and oriented X 3. No facial asymmetry. No focal deficit. Moves all extremities spontaneously. Psych:  Responds to questions appropriately with a normal affect.  EKG:  The EKG was personally reviewed and demonstrates NSR 80bpm, ventricular trigeminy. Ectopy and baseline wander make interpretation challenging, but possible mild ST depression I, V4-V6. Subsequent tracing this AM showed NSR 63bpm, nonspecific T change in III, otherwise nonacute  Relevant CV Studies: Referenced above   Laboratory Data:  Chemistry  Recent Labs Lab 12/08/16 1558 12/09/16 0557  NA 139 140  K 3.4* 4.2  CL 107 111  CO2 25 23  GLUCOSE 106* 98  BUN  12 12  CREATININE 0.98 0.96   CALCIUM 8.9 8.4*  GFRNONAA 50* 51*  GFRAA 58* 59*  ANIONGAP 7 6     Hematology  Recent Labs Lab 12/08/16 1558 12/08/16 1845 12/09/16 0557  WBC 9.3  --  6.2  RBC 3.75* 3.86* 3.56*  HGB 10.7*  --  10.2*  HCT 33.6*  --  31.7*  MCV 89.6  --  89.0  MCH 28.5  --  28.7  MCHC 31.8  --  32.2  RDW 13.6  --  13.5  PLT 144*  --  128*   Cardiac Enzymes  Recent Labs Lab 12/08/16 1845 12/08/16 2343 12/09/16 0557  TROPONINI <0.03 <0.03 <0.03     Recent Labs Lab 12/08/16 1606  TROPIPOC 0.00    BNPNo results for input(s): BNP, PROBNP in the last 168 hours.  DDimer No results for input(s): DDIMER in the last 168 hours.  Radiology/Studies:  Dg Chest 2 View  Result Date: 12/08/2016 CLINICAL DATA:  83 y/o  F; mid and left-sided chest pain. EXAM: CHEST  2 VIEW COMPARISON:  12/23/2015 chest radiograph FINDINGS: Stable normal cardiac silhouette given projection and technique. Aortic atherosclerosis with calcification. Streaky left lung base opacity. No pleural effusion or pneumothorax. No acute osseous abnormality is evident. Surgical clips project over the left breast. IMPRESSION: Streaky left lung base opacity may represent atelectasis or pneumonia. Aortic atherosclerosis. Electronically Signed   By: Kristine Garbe M.D.   On: 12/08/2016 16:24    Assessment and Plan:   1. Pleuritic chest pain - symptoms atypical for ACS, troponins negative despite fairly persistent discomfort yesterday, EKG with increased ectopy on admission (in setting of hypokalemia) but now normalized. CXR has shown possible PNA. EKG is not really classic for pericarditis, so this may be due to the PNA. She has reported cough for approximately 1 month. IM has also ordered d-dimer to further assess. She otherwise has not had any recent anginal symptoms to speak of. Agree with echocardiogram to assess valvular disease. I do not suspect invasive ischemic workup this admission but will discuss further eval with  Dr. Percival Aguirre.  2. Prior aortic stenosis, mitral regurg, tricuspid regurg - preserved S2 on exam; heart murmur is chronic per patient. Await echocardiogram.  3. Anemia with recent BRBPR - per IM.  4. HTN - labile thus far this admission, normotensive this morning. Follow. Not on any meds at home. Increased BP on admission could be related to pain.  5. Hyperlipidemia - traditionally intolerant of statins; can consider f/u in lipid clinic as OP to help manage.  Signed, Charlie Pitter, PA-C  12/09/2016 9:18 AM   History and all data above reviewed.  Patient examined.  I agree with the findings as above.  The patient presents with abrupt onset sharp chest pain.  The pain is sharp and under the left breast.  It happens with deep breathing and decreases her ability to take a deep breath.  She has no associated symptoms, no jaw or arm pain.  She is active for her age but not exerting herself physically routinely.  However, with activity such as grocery shopping she is not having any chest pain.  EKG without acute changes.  Positive PVCs.  She had negative cardiac enzymes.   The patient exam reveals COR:RRR, no rub  ,  Lungs: Clear  ,  Abd: Positive bowel sounds, no rebound no guarding, Ext No edema  .  All available labs, radiology testing, previous records reviewed. Agree with documented assessment  and plan.  Chest pain:  Atypical.  Probably pleuritic.  Echo pending.  I don't strongly suspect pericarditis.  Plan no further cardiac work up pending the echo results.    Dawn Aguirre  2:24 PM  12/09/2016

## 2016-12-09 NOTE — Progress Notes (Signed)
Nutrition Brief Note  Patient identified on the Malnutrition Screening Tool (MST) Report. No significant weight loss within the past year.   Wt Readings from Last 15 Encounters:  12/09/16 138 lb 9.6 oz (62.9 kg)  10/18/16 141 lb 12 oz (64.3 kg)  09/09/16 137 lb 8 oz (62.4 kg)  06/29/16 138 lb 8 oz (62.8 kg)  06/13/16 139 lb 8 oz (63.3 kg)  05/16/16 135 lb (61.2 kg)  04/29/16 137 lb (62.1 kg)  03/07/16 137 lb (62.1 kg)  01/13/16 142 lb 4 oz (64.5 kg)  12/30/15 142 lb 8 oz (64.6 kg)  12/23/15 141 lb 6.4 oz (64.1 kg)  11/18/15 138 lb 12 oz (62.9 kg)  09/02/15 143 lb (64.9 kg)  08/26/15 142 lb 12 oz (64.8 kg)  08/05/15 145 lb 12 oz (66.1 kg)    Body mass index is 24.55 kg/m. Patient meets criteria for normal weight based on current BMI.   Current diet order is heart healthy, patient is consuming approximately 75% of meals at this time. Labs and medications reviewed. Noted plans for d/c home today.  No nutrition interventions warranted at this time. If nutrition issues arise, please consult RD.   Molli Barrows, RD, LDN, [place] Pager [phone] After Hours Pager [phone]

## 2016-12-09 NOTE — Care Management Obs Status (Deleted)
[place] NOTIFICATION   Patient Details  Name: Dawn Aguirre MRN: [id number] Date of Birth: 1925-06-23   Medicare Observation Status Notification Given:       Arley Phenix, RN 12/09/2016, 2:14 PM

## 2016-12-09 NOTE — Progress Notes (Signed)
Pt has small amount of blood coming from rectum (as stated by patient) when she wipes

## 2016-12-09 NOTE — Progress Notes (Signed)
  Echocardiogram 2D Echocardiogram has been performed.  Dawn Aguirre 12/09/2016, 10:39 AM

## 2016-12-10 DIAGNOSIS — J189 Pneumonia, unspecified organism: Secondary | ICD-10-CM | POA: Diagnosis not present

## 2016-12-10 DIAGNOSIS — I350 Nonrheumatic aortic (valve) stenosis: Secondary | ICD-10-CM | POA: Diagnosis not present

## 2016-12-10 DIAGNOSIS — Z23 Encounter for immunization: Secondary | ICD-10-CM | POA: Diagnosis not present

## 2016-12-10 DIAGNOSIS — E039 Hypothyroidism, unspecified: Secondary | ICD-10-CM | POA: Diagnosis not present

## 2016-12-10 DIAGNOSIS — Z79899 Other long term (current) drug therapy: Secondary | ICD-10-CM | POA: Diagnosis not present

## 2016-12-10 DIAGNOSIS — I340 Nonrheumatic mitral (valve) insufficiency: Secondary | ICD-10-CM | POA: Diagnosis not present

## 2016-12-10 DIAGNOSIS — E785 Hyperlipidemia, unspecified: Secondary | ICD-10-CM | POA: Diagnosis not present

## 2016-12-10 DIAGNOSIS — D649 Anemia, unspecified: Secondary | ICD-10-CM | POA: Diagnosis not present

## 2016-12-10 DIAGNOSIS — R072 Precordial pain: Secondary | ICD-10-CM | POA: Diagnosis not present

## 2016-12-10 DIAGNOSIS — I361 Nonrheumatic tricuspid (valve) insufficiency: Secondary | ICD-10-CM | POA: Diagnosis not present

## 2016-12-10 DIAGNOSIS — Z87891 Personal history of nicotine dependence: Secondary | ICD-10-CM | POA: Diagnosis not present

## 2016-12-10 DIAGNOSIS — D509 Iron deficiency anemia, unspecified: Secondary | ICD-10-CM | POA: Diagnosis not present

## 2016-12-10 MED ORDER — IBUPROFEN 200 MG PO TABS: 200.0000 mg | ORAL_TABLET | Freq: Every day | ORAL | Status: DC | PRN

## 2016-12-10 MED ORDER — CEFPODOXIME PROXETIL 200 MG PO TABS: 200.0000 mg | ORAL_TABLET | Freq: Two times a day (BID) | ORAL | 0 refills | Status: DC

## 2016-12-10 NOTE — Discharge Summary (Signed)
Physician Discharge Summary  Dawn Aguirre KGM:[id number] DOB: March 02, 1925 DOA: 12/08/2016  PCP: [name], MD  Admit date: 12/08/2016 Discharge date: 12/10/2016  Time spent: 35 minutes  Recommendations for Outpatient Follow-up:  1. PCP in one week 2. Follow-up chest x-ray in 4-6 weeks   Discharge Diagnoses:    Community-acquired pneumonia   Pleuritic chest pain due to above   Hyperlipidemia   Aortic stenosis   Chest pain   Tricuspid regurgitation   Mitral regurgitation   Normochromic anemia   Discharge Condition: Stable  Diet recommendation: Heart healthy  Filed Weights   12/08/16 2116 12/09/16 0520 12/10/16 0616  Weight: 62.3 kg (137 lb 6.4 oz) 62.9 kg (138 lb 9.6 oz) 61.5 kg (135 lb 9.6 oz)    History of present illness:  83 year old female with history of hypothyroidism, Renard syndrome, arthritis, dyslipidemia presented to the emergency room with left-sided chest pain for 2 days which is worse with movement and deep inspiration, patient reports a chronic cough for weeks which is nightly  Hospital Course:  1. Pleuritic chest pain -Due to pleurisy from left lower lobe infiltrate noted on CT chest and small parapneumonic effusion, CTA chest was negative for pulmonary embolism -Ruled out for MI, troponin 3 negative 2-D echocardiogram showed normal ejection fraction and wall motion -Treated with antibiotics, discharged home on oral cefpodoxime to complete course Needs follow-up chest x-ray in 4-6 weeks  2. Community-acquired pneumonia -As above-  3. Iron deficiency anemia -Defer further workup to PCP  4. Hypothyroidism -Continue Synthroid  Procedures:  Echocardiogram: Normal ejection fraction and wall motion  Consultations: Cardiology   Discharge Exam: Vitals:   12/10/16 0616 12/10/16 0744  BP: (!) 159/66 (!) 156/58  Pulse: 74 69  Resp: 20 18  Temp: 97.8 F (36.6 C) 98.1 F (36.7 C)  SpO2: 97% 95%    General: AAOx3 Cardiovascular:  S1S2/RRR Respiratory: CTAB  Discharge Instructions   Discharge Instructions    Diet - low sodium heart healthy    Complete by:  As directed    Increase activity slowly    Complete by:  As directed      Discharge Medication List as of 12/10/2016 10:56 AM    START taking these medications   Details  cefpodoxime (VANTIN) 200 MG tablet Take 1 tablet (200 mg total) by mouth 2 (two) times daily. For 6days, Starting Sat 12/10/2016, Print    ibuprofen (ADVIL,MOTRIN) 200 MG tablet Take 1 tablet (200 mg total) by mouth daily as needed for mild pain. Alternate with hydrocodone for knee, Starting Sat 12/10/2016, OTC      CONTINUE these medications which have NOT CHANGED   Details  busPIRone (BUSPAR) 30 MG tablet Take 1 tablet (30 mg total) by mouth 2 (two) times daily., Starting Tue 10/18/2016, Normal    cyanocobalamin ([facility] VITAMIN B12) 2000 MCG tablet Take 2,000 mcg by mouth daily., Historical Med    estradiol (ESTRACE) 0.1 MG/GM vaginal cream Place 1 Applicatorful vaginally at bedtime., Starting Fri 02/12/2016, Historical Med    levothyroxine (SYNTHROID, LEVOTHROID) 100 MCG tablet Take 1 tablet (100 mcg total) by mouth daily., Starting Fri 11/11/2016, Normal    Polyethylene Glycol 3350 (MIRALAX PO) Take 17 g by mouth as needed. , Historical Med    hydrOXYzine (ATARAX/VISTARIL) 25 MG tablet Take 1 tablet (25 mg total) by mouth 3 (three) times daily as needed. For severe itching, caution of sedation, Starting Wed 06/29/2016, Normal    triamcinolone cream (KENALOG) 0.5 % Apply 1 application topically  3 (three) times daily as needed., Starting Wed 06/29/2016, Normal       Allergies  Allergen Reactions  . Ace Inhibitors     REACTION: cough  . Dm-Apap-Cpm Nausea Only  . Ezetimibe     REACTION: myalgia  . Ezetimibe-Simvastatin     REACTION: joints swell  . Macrobid [Nitrofurantoin]     ABD PAIN, ALSO BODY ACHES/PAIN  . Moxifloxacin   . Rosuvastatin     REACTION: myalgia  . Septra  [Sulfamethoxazole-Trimethoprim]     Sick feeling   . Simvastatin     REACTION: myalgia  . Triaminic    Follow-up Information    Tower, Wynelle Fanny, MD. Schedule an appointment as soon as possible for a visit in 1 week(s).   Specialties:  Family Medicine, Radiology Contact information: [place] [phone]        chest Xray Follow up in 4 week(s).            The results of significant diagnostics from this hospitalization (including imaging, microbiology, ancillary and laboratory) are listed below for reference.    Significant Diagnostic Studies: Dg Chest 2 View  Result Date: 12/08/2016 CLINICAL DATA:  83 y/o  F; mid and left-sided chest pain. EXAM: CHEST  2 VIEW COMPARISON:  12/23/2015 chest radiograph FINDINGS: Stable normal cardiac silhouette given projection and technique. Aortic atherosclerosis with calcification. Streaky left lung base opacity. No pleural effusion or pneumothorax. No acute osseous abnormality is evident. Surgical clips project over the left breast. IMPRESSION: Streaky left lung base opacity may represent atelectasis or pneumonia. Aortic atherosclerosis. Electronically Signed   By: Kristine Garbe M.D.   On: 12/08/2016 16:24   Ct Angio Chest Pe W Or Wo Contrast  Result Date: 12/09/2016 CLINICAL DATA:  Mid to left chest pain over the last day. Shortness of breath. EXAM: CT ANGIOGRAPHY CHEST WITH CONTRAST TECHNIQUE: Multidetector CT imaging of the chest was performed using the standard protocol during bolus administration of intravenous contrast. Multiplanar CT image reconstructions and MIPs were obtained to evaluate the vascular anatomy. CONTRAST:  100 cc Isovue 370 COMPARISON:  Radiography 12/08/2016 FINDINGS: Cardiovascular: Pulmonary arterial opacification is good. There are no pulmonary emboli. There is aortic atherosclerosis but no sign of aneurysm or dissection. There is some coronary artery calcification. The heart  is at the upper limits of normal in size. No pericardial effusion. Mediastinum/Nodes: No mediastinal or hilar mass or lymphadenopathy. Lungs/Pleura: Right chest is clear except for minimal dependent atelectasis at the right lung base. There is a small left effusion layering dependently with dependent atelectasis, most pronounced in the left lower lobe where there could be coexistent pneumonia. Upper Abdomen: Normal Musculoskeletal: Ordinary thoracic degenerative changes. Review of the MIP images confirms the above findings. IMPRESSION: No pulmonary emboli. Left pleural effusion layering dependently with dependent atelectasis and possible left lower lobe pneumonia. Aortic atherosclerosis.  Some coronary artery calcification. Electronically Signed   By: Nelson Chimes M.D.   On: 12/09/2016 16:01    Microbiology: No results found for this or any previous visit (from the past 240 hour(s)).   Labs: Basic Metabolic Panel:  Recent Labs Lab 12/08/16 1558 12/09/16 0557  NA 139 140  K 3.4* 4.2  CL 107 111  CO2 25 23  GLUCOSE 106* 98  BUN 12 12  CREATININE 0.98 0.96  CALCIUM 8.9 8.4*   Liver Function Tests: No results for input(s): AST, ALT, ALKPHOS, BILITOT, PROT, ALBUMIN in the last 168 hours. No results  for input(s): LIPASE, AMYLASE in the last 168 hours. No results for input(s): AMMONIA in the last 168 hours. CBC:  Recent Labs Lab 12/08/16 1558 12/09/16 0557  WBC 9.3 6.2  HGB 10.7* 10.2*  HCT 33.6* 31.7*  MCV 89.6 89.0  PLT 144* 128*   Cardiac Enzymes:  Recent Labs Lab 12/08/16 1845 12/08/16 2343 12/09/16 0557  TROPONINI <0.03 <0.03 <0.03   BNP: BNP (last 3 results) No results for input(s): BNP in the last 8760 hours.  ProBNP (last 3 results) No results for input(s): PROBNP in the last 8760 hours.  CBG: No results for input(s): GLUCAP in the last 168 hours.     SignedDomenic Polite MD. [facility] 12/10/2016, 1:51 PM

## 2016-12-10 NOTE — Progress Notes (Signed)
Patient has order for every eight hour EKGs.  Per cardiology note no further cardiac workup pending echo results.  RN text paged [facility] to see if order could be changed to PRN.

## 2016-12-10 NOTE — Discharge Instructions (Signed)
Community-Acquired Pneumonia, Adult Pneumonia is an infection of the lungs. One type of pneumonia can happen while a person is in a hospital. A different type can happen when a person is not in a hospital (community-acquired pneumonia). It is easy for this kind to spread from person to person. It can spread to you if you breathe near an infected person who coughs or sneezes. Some symptoms include:  A dry cough.  A wet (productive) cough.  Fever.  Sweating.  Chest pain.  Follow these instructions at home:  Take over-the-counter and prescription medicines only as told by your doctor. ? Only take cough medicine if you are losing sleep. ? If you were prescribed an antibiotic medicine, take it as told by your doctor. Do not stop taking the antibiotic even if you start to feel better.  Sleep with your head and neck raised (elevated). You can do this by putting a few pillows under your head, or you can sleep in a recliner.  Do not use tobacco products. These include cigarettes, chewing tobacco, and e-cigarettes. If you need help quitting, ask your doctor.  Drink enough water to keep your pee (urine) clear or pale yellow. A shot (vaccine) can help prevent pneumonia. Shots are often suggested for:  People older than 81 years of age.  People older than 81 years of age: ? Who are having cancer treatment. ? Who have long-term (chronic) lung disease. ? Who have problems with their body's defense system (immune system).  You may also prevent pneumonia if you take these actions:  Get the flu (influenza) shot every year.  Go to the dentist as often as told.  Wash your hands often. If soap and water are not available, use hand sanitizer.  Contact a doctor if:  You have a fever.  You lose sleep because your cough medicine does not help. Get help right away if:  You are short of breath and it gets worse.  You have more chest pain.  Your sickness gets worse. This is very serious  if: ? You are an older adult. ? Your body's defense system is weak.  You cough up blood. This information is not intended to replace advice given to you by your health care provider. Make sure you discuss any questions you have with your health care provider. Document Released: 07/13/2007 Document Revised: 07/02/2015 Document Reviewed: 05/21/2014 Elsevier Interactive Patient Education  2018 [employer] Heart-healthy meal planning includes:  Limiting unhealthy fats.  Increasing healthy fats.  Making other small dietary changes.  You may need to talk with your doctor or a diet specialist (dietitian) to create an eating plan that is right for you. What types of fat should I choose?  Choose healthy fats. These include olive oil and canola oil, flaxseeds, walnuts, almonds, and seeds.  Eat more omega-3 fats. These include salmon, mackerel, sardines, tuna, flaxseed oil, and ground flaxseeds. Try to eat fish at least twice each week.  Limit saturated fats. ? Saturated fats are often found in animal products, such as meats, butter, and cream. ? Plant sources of saturated fats include palm oil, palm kernel oil, and coconut oil.  Avoid foods with partially hydrogenated oils in them. These include stick margarine, some tub margarines, cookies, crackers, and other baked goods. These contain trans fats. What general guidelines do I need to follow?  Check food labels carefully. Identify foods with trans fats or high amounts of saturated fat.  Fill one half of  your plate with vegetables and green salads. Eat 4-5 servings of vegetables per day. A serving of vegetables is: ? 1 cup of raw leafy vegetables. ?  cup of raw or cooked cut-up vegetables. ?  cup of vegetable juice.  Fill one fourth of your plate with whole grains. Look for the word "whole" as the first word in the ingredient list.  Fill one fourth of your plate with lean protein foods.  Eat 4-5  servings of fruit per day. A serving of fruit is: ? One medium whole fruit. ?  cup of dried fruit. ?  cup of fresh, frozen, or canned fruit. ?  cup of 100% fruit juice.  Eat more foods that contain soluble fiber. These include apples, broccoli, carrots, beans, peas, and barley. Try to get 20-30 g of fiber per day.  Eat more home-cooked food. Eat less restaurant, buffet, and fast food.  Limit or avoid alcohol.  Limit foods high in starch and sugar.  Avoid fried foods.  Avoid frying your food. Try baking, boiling, grilling, or broiling it instead. You can also reduce fat by: ? Removing the skin from poultry. ? Removing all visible fats from meats. ? Skimming the fat off of stews, soups, and gravies before serving them. ? Steaming vegetables in water or broth.  Lose weight if you are overweight.  Eat 4-5 servings of nuts, legumes, and seeds per week: ? One serving of dried beans or legumes equals  cup after being cooked. ? One serving of nuts equals 1 ounces. ? One serving of seeds equals  ounce or one tablespoon.  You may need to keep track of how much salt or sodium you eat. This is especially true if you have high blood pressure. Talk with your doctor or dietitian to get more information. What foods can I eat? Grains Breads, including [place], white, pita, wheat, raisin, rye, oatmeal, and [place]. Tortillas that are neither fried nor made with lard or trans fat. Low-fat rolls, including hotdog and hamburger buns and English muffins. Biscuits. Muffins. Waffles. Pancakes. Light popcorn. Whole-grain cereals. Flatbread. Melba toast. Pretzels. Breadsticks. Rusks. Low-fat snacks. Low-fat crackers, including oyster, saltine, matzo, graham, animal, and rye. Rice and pasta, including brown rice and pastas that are made with whole wheat. Vegetables All vegetables. Fruits All fruits, but limit coconut. Meats and Other Protein Sources Lean, well-trimmed beef, veal, pork, and lamb.  Chicken and [place] without skin. All fish and shellfish. Wild duck, rabbit, pheasant, and venison. Egg whites or low-cholesterol egg substitutes. Dried beans, peas, lentils, and tofu. Seeds and most nuts. Dairy Low-fat or nonfat cheeses, including ricotta, string, and mozzarella. Skim or 1% milk that is liquid, powdered, or evaporated. Buttermilk that is made with low-fat milk. Nonfat or low-fat yogurt. Beverages Mineral water. Diet carbonated beverages. Sweets and Desserts Sherbets and fruit ices. Honey, jam, marmalade, jelly, and syrups. Meringues and gelatins. Pure sugar candy, such as hard candy, jelly beans, gumdrops, mints, marshmallows, and small amounts of dark chocolate. [employer] Eat all sweets and desserts in moderation. Fats and Oils Nonhydrogenated (trans-free) margarines. Vegetable oils, including soybean, sesame, sunflower, olive, peanut, safflower, corn, canola, and cottonseed. Salad dressings or mayonnaise made with a vegetable oil. Limit added fats and oils that you use for cooking, baking, salads, and as spreads. Other Cocoa powder. Coffee and tea. All seasonings and condiments. The items listed above may not be a complete list of recommended foods or beverages. Contact your dietitian for more options. What foods are not  recommended? Grains Breads that are made with saturated or trans fats, oils, or whole milk. Croissants. Butter rolls. Cheese breads. Sweet rolls. Donuts. Buttered popcorn. Chow mein noodles. High-fat crackers, such as cheese or butter crackers. Meats and Other Protein Sources Fatty meats, such as hotdogs, short ribs, sausage, spareribs, bacon, rib eye roast or steak, and mutton. High-fat deli meats, such as salami and bologna. Caviar. Domestic duck and goose. Organ meats, such as kidney, liver, sweetbreads, and heart. Dairy Cream, sour cream, cream cheese, and creamed cottage cheese. Whole-milk cheeses, including blue (bleu), Monte[place] Jack, [place], [place],  American, [place], Swiss, cheddar, [place], and [place]. Whole or 2% milk that is liquid, evaporated, or condensed. Whole buttermilk. Cream sauce or high-fat cheese sauce. Yogurt that is made from whole milk. Beverages Regular sodas and juice drinks with added sugar. Sweets and Desserts Frosting. Pudding. Cookies. Cakes other than angel food cake. Candy that has milk chocolate or white chocolate, hydrogenated fat, butter, coconut, or unknown ingredients. Buttered syrups. Full-fat ice cream or ice cream drinks. Fats and Oils Gravy that has suet, meat fat, or shortening. Cocoa butter, hydrogenated oils, palm oil, coconut oil, palm kernel oil. These can often be found in baked products, candy, fried foods, nondairy creamers, and whipped toppings. Solid fats and shortenings, including bacon fat, salt pork, lard, and butter. Nondairy cream substitutes, such as coffee creamers and sour cream substitutes. Salad dressings that are made of unknown oils, cheese, or sour cream. The items listed above may not be a complete list of foods and beverages to avoid. Contact your dietitian for more information. This information is not intended to replace advice given to you by your health care provider. Make sure you discuss any questions you have with your health care provider. Document Released: 07/26/2011 Document Revised: 07/02/2015 Document Reviewed: 07/18/2013 Elsevier Interactive Patient Education  [employer].

## 2016-12-13 ENCOUNTER — Telehealth: Payer: Self-pay | Admitting: *Deleted

## 2016-12-13 NOTE — Telephone Encounter (Signed)
Transition Care Management Follow-up Telephone Call   Date discharged? 12/10/2016    How have you been since you were released from the hospital? "not very well"   Do you understand why you were in the hospital? yes   Do you understand the discharge instructions? yes   Where were you discharged to? home   Items Reviewed:  Medications reviewed: yes  Allergies reviewed: yes  Dietary changes reviewed: n/a  Referrals reviewed:  n/a   Functional Questionnaire:   Activities of Daily Living (ADLs):   She states they are independent in the following: independent in all areas    Any transportation issues/concerns?: no   Any patient concerns? no   Confirmed importance and date/time of follow-up visits scheduled yes  Provider Appointment booked with D. Carlean Purl 12/14/16 @1400   Confirmed with patient if condition begins to worsen call PCP or go to the ER.  Patient was given the office number and encouraged to call back with question or concerns.  : yes

## 2016-12-14 ENCOUNTER — Encounter: Payer: Self-pay | Admitting: Family Medicine

## 2016-12-14 ENCOUNTER — Ambulatory Visit (INDEPENDENT_AMBULATORY_CARE_PROVIDER_SITE_OTHER): Payer: Medicare HMO | Admitting: Family Medicine

## 2016-12-14 VITALS — BP 174/78 | HR 71 | Temp 97.9°F | Wt 142.5 lb

## 2016-12-14 DIAGNOSIS — Z09 Encounter for follow-up examination after completed treatment for conditions other than malignant neoplasm: Secondary | ICD-10-CM

## 2016-12-14 DIAGNOSIS — J189 Pneumonia, unspecified organism: Secondary | ICD-10-CM

## 2016-12-14 DIAGNOSIS — R091 Pleurisy: Secondary | ICD-10-CM

## 2016-12-14 DIAGNOSIS — J181 Lobar pneumonia, unspecified organism: Secondary | ICD-10-CM

## 2016-12-14 NOTE — Progress Notes (Signed)
Subjective:    Patient ID: Dawn Aguirre, female    DOB: [birth year], 83 y.o.   MRN: [id number]  HPI This is an 83 yo female who presents today for follow up of recent hospitalization. She was admitted 12/08/16-12/10/16 with community acquired pneumonia. She had LLL infiltrate by chest CT and small parapneumonic effusion. She had negative troponin x 3 and echo with normal EF. She was treated with antibiotics and discharged on cefpodoxime. No side effects with antibiotic. She needs follow up chest xray in 4-6 weeks. Today she reports that she is exhausted. Will feel ok for a few hours and is then very tired. Breathing is much better, but still some SOB.  She has taken small amount of Advil and used heat for left sided pain with some improvement. No fever or cough. Some clear nasal drainage.   Current Outpatient Medications on File Prior to Visit  Medication Sig Dispense Refill  . busPIRone (BUSPAR) 30 MG tablet Take 1 tablet (30 mg total) by mouth 2 (two) times daily. (Patient taking differently: Take 30 mg by mouth as needed. ) 60 tablet 11  . cefpodoxime (VANTIN) 200 MG tablet Take 1 tablet (200 mg total) by mouth 2 (two) times daily. For 6days 12 tablet 0  . cyanocobalamin ([facility]S VITAMIN B12) 2000 MCG tablet Take 2,000 mcg by mouth daily.    Marland Kitchen estradiol (ESTRACE) 0.1 MG/GM vaginal cream Place 1 Applicatorful vaginally at bedtime.    Marland Kitchen ibuprofen (ADVIL,MOTRIN) 200 MG tablet Take 1 tablet (200 mg total) by mouth daily as needed for mild pain. Alternate with hydrocodone for knee    . levothyroxine (SYNTHROID, LEVOTHROID) 100 MCG tablet Take 1 tablet (100 mcg total) by mouth daily. 90 tablet 1  . Polyethylene Glycol 3350 (MIRALAX PO) Take 17 g by mouth as needed.     . triamcinolone cream (KENALOG) 0.5 % Apply 1 application topically 3 (three) times daily as needed. 30 g 0  . hydrOXYzine (ATARAX/VISTARIL) 25 MG tablet Take 1 tablet (25 mg total) by mouth 3 (three) times daily as needed. For severe  itching, caution of sedation (Patient not taking: Reported on 12/14/2016) 30 tablet 1   No current facility-administered medications on file prior to visit.      Past Medical History:  Diagnosis Date  . Anxiety   . Aortic stenosis    a. mild-mod by echo 2012.  . Cancer of left breast ([place])    surgery at [facility]  . Chronic airway obstruction, not elsewhere classified   . Colitis, ischemic ([place]) 07/2000   ? infect  . Degeneration of cervical intervertebral disc   . Diffuse cystic mastopathy   . Diverticulosis of colon (without mention of hemorrhage)   . Esophageal reflux   . Essential hypertension   . External hemorrhoids without mention of complication   . Fibula fracture 2006  . Hyperlipidemia    a. intol of multiple statins.  . Lichen sclerosus    back/abd/vulvar  . Mastoiditis of right side 04/2005   admitted  . Mild mitral regurgitation 2012  . Moderate tricuspid regurgitation 2012  . Osteoporosis, unspecified   . Personal history of tobacco use, presenting hazards to health   . RA (rheumatoid arthritis) ([facility])    Sero-negative  . Raynaud's syndrome   . Spinal stenosis   . Stress at home    "caring for son who is disabled" (12/08/2016)  . Unspecified gastritis and gastroduodenitis without mention of hemorrhage   . Unspecified hypothyroidism  Past Surgical History:  Procedure Laterality Date  . ABDOMINAL HYSTERECTOMY  1968  . BREAST BIOPSY Left   . BREAST LUMPECTOMY Left   . Cardiollite  02/2002   Neg  . CATARACT EXTRACTION W/ INTRAOCULAR LENS IMPLANT Right   . COLONOSCOPY    . COLONOSCOPY    . DEXA  2001   OP,heel  . DEXA     OP hip T-2.94  . NASAL SINUS SURGERY  10/2002   Archie Endo 06/23/2010  . Nuclear Stress Test  11/07   Normal  . SHOULDER ARTHROSCOPY W/ ROTATOR CUFF REPAIR Right 06/2005   Archie Endo 06/23/2010  . TONSILLECTOMY    . US ECHOCARDIOGRAPHY  10/06   normal LVF EF 55-65%   Family History  Problem Relation Age of Onset  . Heart attack Father 55    . Coronary artery disease Sister 71       CABG  . Heart attack Brother 84  . Coronary artery disease Unknown        GF   Social History   Tobacco Use  . Smoking status: Former Smoker    Packs/day: 2.00    Years: 10.00    Pack years: 20.00    Types: Cigarettes    Last attempt to quit: 02/07/1962    Years since quitting: 54.8  . Smokeless tobacco: Never Used  Substance Use Topics  . Alcohol use: Yes    Alcohol/week: 0.0 oz    Comment: 12/08/2016 "glass of wine a couple times/month"  . Drug use: No    Review of Systems Per HPI    Objective:   Physical Exam  Constitutional: She is oriented to person, place, and time. She appears well-developed and well-nourished. No distress.  HENT:  Head: Normocephalic and atraumatic.  Eyes: Conjunctivae are normal.  Cardiovascular: Normal rate, regular rhythm and normal heart sounds.  Pulmonary/Chest: Effort normal and breath sounds normal.  Neurological: She is alert and oriented to person, place, and time.  Skin: Skin is warm and dry. She is not diaphoretic.  Psychiatric: She has a normal mood and affect. Her behavior is normal. Judgment and thought content normal.  Vitals reviewed.     BP (!) 174/78 (BP Location: Right Arm, Patient Position: Sitting, Cuff Size: Normal)   Pulse 71   Temp 97.9 F (36.6 C) (Oral)   Wt 142 lb 8 oz (64.6 kg)   SpO2 98%   BMI 25.24 kg/m  Wt Readings from Last 3 Encounters:  12/14/16 142 lb 8 oz (64.6 kg)  12/10/16 135 lb 9.6 oz (61.5 kg)  10/18/16 141 lb 12 oz (64.3 kg)       Assessment & Plan:  1. Pneumonia of left lower lobe due to infectious organism [facility]) - Patient instructed to finish antibiotic - RTC/ED precautions reviewed - she needs follow up CXR in 1 month, will have her follow up with PCP to make sure she is regaining strength and energy  2. Pleurisy - Provided written and verbal information regarding diagnosis and treatment. - Encouraged her to do deep breathing, apply heat,  take NSAIDs  3. Hospital discharge follow-up - medications reviewed - follow up with PCP in 1 month   Clarene Reamer, FNP-BC  [facility] [place]  12/14/2016 2:33 PM

## 2016-12-14 NOTE — Patient Instructions (Signed)
Take Advil- 1-2 tablets every 8-12 hours as needed for pain Finish your antibiotic Please follow up in 1 month for a visit and xray  Pleurisy Pleurisy, also called pleuritis, is irritation and swelling (inflammation) of the linings of the lungs. The linings of the lungs are called pleura. They cover the outside of the lungs and the inside of the chest wall. There is a small amount of fluid (pleural fluid) between the pleura that allows the lungs to move in and out smoothly when you breathe. Pleurisy causes the pleura to be rough and dry and to rub together when you breathe, which is painful. In some cases, pleurisy can cause pleural fluid to build up between the pleura (pleural effusion). What are the causes? Common causes of this condition include:  A lung infection caused by bacteria or a virus.  A blood clot that travels to the lung (pulmonary embolism).  Air leaking into the pleural space (pneumothorax).  Lung cancer or a lung tumor.  A chest injury.  Diseases that can cause lung inflammation. These include rheumatoid arthritis, lupus, sickle cell disease, inflammatory bowel disease, and pancreatitis.  Heart or chest surgery.  Lung damage from inhaling asbestos.  A lung reaction to certain medicines.  Sometimes the cause is unknown. What are the signs or symptoms? Chest pain is the main symptom of this condition. The pain is usually on one side. Chest pain may start suddenly and be sharp or stabbing. It may become a constant dull ache. You may also feel pain in your back or shoulder. The pain may get worse when you cough, take deep breaths, or make sudden movements. Other symptoms may include:  Shortness of breath.  Noisy breathing (wheezing).  Cough.  Chills.  Fever.  How is this diagnosed? This condition may be diagnosed based on:  Your medical history.  Your symptoms.  A physical exam. Your health care provider will listen to your breathing with a stethoscope to  check for a rough, rubbing sound (friction rub). If you have pleural effusion, your breathing sounds may be muffled.  Tests, such as: ? Blood tests to check for infections or diseases and to measure the oxygen in your blood. ? Imaging studies of your lungs. These may include a chest X-ray, ultrasound, MRI, or CT scan. ? A procedure to remove pleural fluid with a needle for testing (thoracentesis).  How is this treated? Treatment for this condition depends on the cause. Pleurisy that was caused by a virus usually clears up within 2 weeks. Treatment for pleurisy may include:  NSAIDs to help relieve pain and swelling.  Antibiotic medicines, if your condition was caused by a bacterial infection.  Prescription pain or cough medicine.  Medicines to dissolve a blood clot, if your condition was caused by pulmonary embolism.  Removal of pleural fluid or air.  Follow these instructions at home: Medicines  Take over-the-counter and prescription medicines only as told by your health care provider.  If you were prescribed an antibiotic, take it as told by your health care provider. Do not stop taking the antibiotic even if you start to feel better. Activity  Rest and return to your normal activities as told by your health care provider. Ask your health care provider what activities are safe for you.  Do not drive or use heavy machinery while taking prescription pain medicine. General instructions  Monitor your pleurisy for any changes.  Take deep breaths often, even if it is painful. This can help prevent lung  infection (pneumonia) and collapse of lung tissue (atelectasis).  When lying down, lie on your painful side. This may reduce pain.  Do not smoke. If you need help quitting, ask your health care provider.  Keep all follow-up visits as told by your health care provider. This is important. Contact a health care provider if:  You have pain that: ? Gets worse. ? Does not get better  with medicine. ? Lasts for more than 1 week.  You have a fever or chills.  Your cough or shortness of breath is not improving at home.  You cough up pus-like (purulent) secretions. Get help right away if:  Your lips, fingernails, or toenails darken or turn blue.  You cough up blood.  You have any of the following symptoms that get worse: ? Difficulty breathing. ? Shortness of breath. ? Wheezing.  You have pain that spreads into your neck, arms, or jaw.  You develop a rash.  You vomit.  You faint. Summary  Pleurisy is inflammation of the linings of the lungs (pleura).  Pleurisy causes pain that makes it difficult for you to breathe or cough.  Pleurisy is often caused by an underlying infection or disease.  Treatment of pleurisy depends on the cause, and it often includes medicines. This information is not intended to replace advice given to you by your health care provider. Make sure you discuss any questions you have with your health care provider. Document Released: 01/24/2005 Document Revised: 10/19/2015 Document Reviewed: 10/19/2015 Elsevier Interactive Patient Education  2017 [employer].

## 2017-01-17 ENCOUNTER — Ambulatory Visit: Payer: Medicare HMO | Admitting: Family Medicine

## 2017-02-28 DIAGNOSIS — M19012 Primary osteoarthritis, left shoulder: Secondary | ICD-10-CM | POA: Diagnosis not present

## 2017-02-28 DIAGNOSIS — M47812 Spondylosis without myelopathy or radiculopathy, cervical region: Secondary | ICD-10-CM | POA: Diagnosis not present

## 2017-03-17 ENCOUNTER — Encounter: Payer: Self-pay | Admitting: Family Medicine

## 2017-03-17 ENCOUNTER — Ambulatory Visit (INDEPENDENT_AMBULATORY_CARE_PROVIDER_SITE_OTHER): Payer: Medicare HMO | Admitting: Family Medicine

## 2017-03-17 VITALS — BP 150/80 | HR 82 | Temp 98.1°F | Ht 61.5 in | Wt 141.5 lb

## 2017-03-17 DIAGNOSIS — G8929 Other chronic pain: Secondary | ICD-10-CM

## 2017-03-17 DIAGNOSIS — W19XXXA Unspecified fall, initial encounter: Secondary | ICD-10-CM

## 2017-03-17 DIAGNOSIS — N8110 Cystocele, unspecified: Secondary | ICD-10-CM | POA: Diagnosis not present

## 2017-03-17 DIAGNOSIS — Y92009 Unspecified place in unspecified non-institutional (private) residence as the place of occurrence of the external cause: Secondary | ICD-10-CM | POA: Diagnosis not present

## 2017-03-17 DIAGNOSIS — R5382 Chronic fatigue, unspecified: Secondary | ICD-10-CM | POA: Diagnosis not present

## 2017-03-17 NOTE — Patient Instructions (Signed)
Make sure someone is in the bedroom with your son all night so you do not have to take care of him   For abdominal pain that you think is related to cystocele - I will refer you back to your urologist/surgeon   If she determines that it is not the cystocele - then we need to set you up with a new GI for your abdominal pain   We need to resolve the abdominal pain so you can sleep

## 2017-03-17 NOTE — Progress Notes (Signed)
Subjective:    Patient ID: Dawn Aguirre, female    DOB: 1925-12-25, 84 y.o.   MRN: [id number]  HPI Here for recurrent abd pain /cystocele and fatigue from lack of sleep  Shoulder that was operated on  Had injection in R shoulder 2 weeks ago and it is helping  Will be injecting other shoulder and knee as well   Very very tired  Not sleeping - due to both her son and also for pain (abdominal pain)   She was ref to Dr Collene Mares at last visit  Told she had constipation  She does not think it is constipation  She was unsatisfied and does not want to return   Needs to see Dr Rodman Key (her surgeon) - cystocele surgery  Did very well until about a month ago  She is having more problems- change in bms / more backed up  Her bm is thin again (pencil thin)  Feels like that obstruction is back  Is generally uncomfortable in the vagina (unsure if a bulge)  Using an estrogen cream every night     She is up with her son all night (he wanders and falls) -his dementia is making him wander  He missed a chair sitting down She body bumped her into the chair fell Bumped her L side -did not get hurt     She does have caregiver there now Rennie Natter on weekends   Wt Readings from Last 3 Encounters:  03/17/17 141 lb 8 oz (64.2 kg)  12/14/16 142 lb 8 oz (64.6 kg)  12/10/16 135 lb 9.6 oz (61.5 kg)    Patient Active Problem List   Diagnosis Date Noted  . Tricuspid regurgitation 12/09/2016  . Mitral regurgitation 12/09/2016  . Normochromic anemia 12/09/2016  . Chest pain 12/08/2016  . Chronic abdominal pain 10/18/2016  . Urine frequency 09/09/2016  . Malaise and fatigue 09/09/2016  . Rash 06/13/2016  . Sleep disorder 01/13/2016  . Female cystocele 12/30/2015  . Left shoulder pain 09/02/2015  . Neck pain 09/02/2015  . Routine general medical examination at a health care facility 07/22/2015  . Hearing loss 07/22/2015  . Hyperglycemia 07/22/2015  . Anxiety disorder 04/24/2015  . Tremor  12/20/2014  . Chronic pain 10/14/2014  . B12 deficiency 07/16/2014  . Vitamin D deficiency 07/16/2014  . Fatigue 07/04/2014  . Encounter for Medicare annual wellness exam 01/13/2014  . Fall in home 12/24/2013  . History of breast cancer 12/18/2013  . Epiploic appendagitis 02/22/2013  . Dry mouth 02/04/2013  . Chest wall pain 12/25/2012  . Benign paroxysmal positional vertigo 09/14/2012  . Low back pain 09/07/2012  . Constipation 11/04/2011  . Aortic stenosis 08/31/2010  . Hyperlipidemia   . STRESS REACTION, ACUTE, WITH EMOTIONAL DISTURBANCE 10/12/2007  . [place] DISEASE, CERVICAL 10/12/2007  . Hypothyroidism 01/22/2007  . RAYNAUD'S SYNDROME 01/22/2007  . EXTERNAL HEMORRHOIDS 01/22/2007  . DIVERTICULOSIS, COLON 01/22/2007  . FIBROCYSTIC BREAST DISEASE 01/22/2007  . Osteoporosis 01/22/2007  . GERD 12/16/2006  . DYSPNEA ON EXERTION 12/16/2006  . Cough 12/16/2006  . TOBACCO ABUSE, HX OF 12/16/2006  . HYSTERECTOMY, HX OF 12/16/2006   Past Medical History:  Diagnosis Date  . Anxiety   . Aortic stenosis    a. mild-mod by echo 2012.  . Cancer of left breast ([place])    surgery at [facility]  . Chronic airway obstruction, not elsewhere classified   . Colitis, ischemic ([place]) 07/2000   ? infect  . Degeneration of cervical intervertebral disc   .  Diffuse cystic mastopathy   . Diverticulosis of colon (without mention of hemorrhage)   . Esophageal reflux   . Essential hypertension   . External hemorrhoids without mention of complication   . Fibula fracture 2006  . Hyperlipidemia    a. intol of multiple statins.  . Lichen sclerosus    back/abd/vulvar  . Mastoiditis of right side 04/2005   admitted  . Mild mitral regurgitation 2012  . Moderate tricuspid regurgitation 2012  . Osteoporosis, unspecified   . Personal history of tobacco use, presenting hazards to health   . RA (rheumatoid arthritis) ([facility])    Sero-negative  . Raynaud's syndrome   . Spinal stenosis   . Stress at home     "caring for son who is disabled" (12/08/2016)  . Unspecified gastritis and gastroduodenitis without mention of hemorrhage   . Unspecified hypothyroidism    Past Surgical History:  Procedure Laterality Date  . ABDOMINAL HYSTERECTOMY  1968  . BREAST BIOPSY Left   . BREAST LUMPECTOMY Left   . Cardiollite  02/2002   Neg  . CATARACT EXTRACTION W/ INTRAOCULAR LENS IMPLANT Right   . COLONOSCOPY    . COLONOSCOPY    . DEXA  2001   OP,heel  . DEXA     OP hip T-2.94  . NASAL SINUS SURGERY  10/2002   Archie Endo 06/23/2010  . Nuclear Stress Test  11/07   Normal  . SHOULDER ARTHROSCOPY W/ ROTATOR CUFF REPAIR Right 06/2005   Archie Endo 06/23/2010  . TONSILLECTOMY    . US ECHOCARDIOGRAPHY  10/06   normal LVF EF 55-65%   Social History   Tobacco Use  . Smoking status: Former Smoker    Packs/day: 2.00    Years: 10.00    Pack years: 20.00    Types: Cigarettes    Last attempt to quit: 02/07/1962    Years since quitting: 55.1  . Smokeless tobacco: Never Used  Substance Use Topics  . Alcohol use: Yes    Alcohol/week: 0.0 oz    Comment: 12/08/2016 "glass of wine a couple times/month"  . Drug use: No   Family History  Problem Relation Age of Onset  . Heart attack Father 70  . Coronary artery disease Sister 17       CABG  . Heart attack Brother 70  . Coronary artery disease Unknown        GF   Allergies  Allergen Reactions  . Ace Inhibitors     REACTION: cough  . Dm-Apap-Cpm Nausea Only  . Ezetimibe     REACTION: myalgia  . Ezetimibe-Simvastatin     REACTION: joints swell  . Macrobid [Nitrofurantoin]     ABD PAIN, ALSO BODY ACHES/PAIN  . Moxifloxacin   . Rosuvastatin     REACTION: myalgia  . Septra [Sulfamethoxazole-Trimethoprim]     Sick feeling   . Simvastatin     REACTION: myalgia  . Triaminic    Current Outpatient Medications on File Prior to Visit  Medication Sig Dispense Refill  . busPIRone (BUSPAR) 30 MG tablet Take 1 tablet (30 mg total) by mouth 2 (two) times daily.  (Patient taking differently: Take 30 mg by mouth as needed. ) 60 tablet 11  . cefpodoxime (VANTIN) 200 MG tablet Take 1 tablet (200 mg total) by mouth 2 (two) times daily. For 6days 12 tablet 0  . cyanocobalamin ([facility] VITAMIN B12) 2000 MCG tablet Take 2,000 mcg by mouth daily.    Marland Kitchen estradiol (ESTRACE) 0.1 MG/GM vaginal cream Place 1  Applicatorful vaginally at bedtime.    . hydrOXYzine (ATARAX/VISTARIL) 25 MG tablet Take 1 tablet (25 mg total) by mouth 3 (three) times daily as needed. For severe itching, caution of sedation 30 tablet 1  . ibuprofen (ADVIL,MOTRIN) 200 MG tablet Take 1 tablet (200 mg total) by mouth daily as needed for mild pain. Alternate with hydrocodone for knee    . levothyroxine (SYNTHROID, LEVOTHROID) 100 MCG tablet Take 1 tablet (100 mcg total) by mouth daily. 90 tablet 1  . Polyethylene Glycol 3350 (MIRALAX PO) Take 17 g by mouth as needed.     . triamcinolone cream (KENALOG) 0.5 % Apply 1 application topically 3 (three) times daily as needed. 30 g 0   No current facility-administered medications on file prior to visit.     Review of Systems  Constitutional: Positive for fatigue. Negative for activity change, appetite change, fever and unexpected weight change.  HENT: Negative for congestion, ear pain, rhinorrhea, sinus pressure and sore throat.   Eyes: Negative for pain, redness and visual disturbance.  Respiratory: Negative for cough, shortness of breath and wheezing.   Cardiovascular: Negative for chest pain and palpitations.  Gastrointestinal: Positive for abdominal distention, abdominal pain and constipation. Negative for blood in stool and diarrhea.  Endocrine: Negative for polydipsia and polyuria.  Genitourinary: Positive for pelvic pain and vaginal pain. Negative for dysuria, frequency, urgency, vaginal bleeding and vaginal discharge.  Musculoskeletal: Positive for arthralgias. Negative for back pain and myalgias.  Skin: Negative for pallor and rash.    Allergic/Immunologic: Negative for environmental allergies.  Neurological: Negative for dizziness, syncope and headaches.  Hematological: Negative for adenopathy. Does not bruise/bleed easily.  Psychiatric/Behavioral: Positive for sleep disturbance. Negative for decreased concentration and dysphoric mood. The patient is nervous/anxious.        Objective:   Physical Exam  Constitutional: She appears well-developed and well-nourished. No distress.  HENT:  Head: Normocephalic and atraumatic.  Mouth/Throat: Oropharynx is clear and moist.  Eyes: Conjunctivae and EOM are normal. Pupils are equal, round, and reactive to light. No scleral icterus.  Neck: Normal range of motion. Neck supple.  Cardiovascular: Normal rate and regular rhythm.  Murmur heard. Pulmonary/Chest: Effort normal and breath sounds normal. No respiratory distress. She has no wheezes. She has no rales.  Abdominal: Soft. Bowel sounds are normal. She exhibits no distension, no pulsatile liver, no abdominal bruit, no pulsatile midline mass and no mass. There is no hepatosplenomegaly. There is tenderness. There is no rebound, no guarding and no CVA tenderness.  No M noted Tender over bilat LQs No rebound or guarding  Nl bs  Musculoskeletal: She exhibits no deformity.  Kyphosis  Limited rom of shoulders  Lymphadenopathy:    She has no cervical adenopathy.  Neurological: She is alert. She displays tremor. No cranial nerve deficit. She exhibits normal muscle tone. Coordination normal.  Skin: Skin is warm and dry. No rash noted. No erythema.  Psychiatric: Her speech is normal and behavior is normal. Her mood appears anxious. Her affect is not blunt and not labile. She does not exhibit a depressed mood.          Assessment & Plan:   Problem List Items Addressed This Visit      Genitourinary   Female cystocele - Primary    After resolution of symptoms for almost a year s/p surgery - she is again symptomatic Disc pelvic  and bladder pressure and return of stools to thin caliber and difficult to pass as they were previously  Pt is worried the cystocele has returned or something occurred with the surgery  Will ref back to surgeon for re check She did see GI who wanted to tx for constipation (she thinks this is anatomical however)  No s/s of uti  May still need to see gi in the future (does not want to return to Dr Collene Mares)      Relevant Orders   Ambulatory referral to Urology     Other   Fall in home    Had another fall- with son when he was wandering at night (he has dementia with down's syndrome) Disc imp of caregiver (hired during the week and cousein on weekends) to stay in his room so they can have hands on him if he gets up  -- to prevent further falls  She agrees  Enc use of walker or cane if unsteady      Fatigue    Stressors-caring for husb and son as well as her own health issues Not sleeping now due to abd/pelvic pain and constipation she thinks is from recurrence of cystocele Will ref to surgeon for that

## 2017-03-19 NOTE — Assessment & Plan Note (Signed)
Stressors-caring for husb and son as well as her own health issues Not sleeping now due to abd/pelvic pain and constipation she thinks is from recurrence of cystocele Will ref to surgeon for that

## 2017-03-19 NOTE — Assessment & Plan Note (Signed)
Had another fall- with son when he was wandering at night (he has dementia with down's syndrome) Disc imp of caregiver (hired during the week and cousein on weekends) to stay in his room so they can have hands on him if he gets up  -- to prevent further falls  She agrees  Enc use of walker or cane if unsteady

## 2017-03-19 NOTE — Assessment & Plan Note (Signed)
For f/u with Dr Lynann Bologna to inject shoulders /other areas  She does not think she sustained sig injury with last fall

## 2017-03-19 NOTE — Assessment & Plan Note (Signed)
After resolution of symptoms for almost a year s/p surgery - she is again symptomatic Disc pelvic and bladder pressure and return of stools to thin caliber and difficult to pass as they were previously  Pt is worried the cystocele has returned or something occurred with the surgery  Will ref back to surgeon for re check She did see GI who wanted to tx for constipation (she thinks this is anatomical however)  No s/s of uti  May still need to see gi in the future (does not want to return to Dr Collene Mares)

## 2017-03-22 DIAGNOSIS — K5909 Other constipation: Secondary | ICD-10-CM | POA: Diagnosis not present

## 2017-03-22 DIAGNOSIS — K5901 Slow transit constipation: Secondary | ICD-10-CM | POA: Diagnosis not present

## 2017-03-22 DIAGNOSIS — L900 Lichen sclerosus et atrophicus: Secondary | ICD-10-CM | POA: Diagnosis not present

## 2017-03-22 DIAGNOSIS — N904 Leukoplakia of vulva: Secondary | ICD-10-CM | POA: Diagnosis not present

## 2017-05-19 ENCOUNTER — Encounter: Payer: Self-pay | Admitting: Family Medicine

## 2017-05-19 ENCOUNTER — Ambulatory Visit (INDEPENDENT_AMBULATORY_CARE_PROVIDER_SITE_OTHER): Payer: Medicare HMO | Admitting: Family Medicine

## 2017-05-19 VITALS — BP 140/80 | HR 66 | Temp 99.1°F | Ht 61.5 in | Wt 136.8 lb

## 2017-05-19 DIAGNOSIS — S61252A Open bite of right middle finger without damage to nail, initial encounter: Secondary | ICD-10-CM | POA: Diagnosis not present

## 2017-05-19 DIAGNOSIS — W540XXA Bitten by dog, initial encounter: Secondary | ICD-10-CM | POA: Diagnosis not present

## 2017-05-19 MED ORDER — AMOXICILLIN-POT CLAVULANATE 875-125 MG PO TABS: 1.0000 | ORAL_TABLET | Freq: Two times a day (BID) | ORAL | 0 refills | Status: DC

## 2017-05-19 NOTE — Progress Notes (Signed)
Subjective:    Patient ID: Dawn Aguirre, female    DOB: Sep 29, 1925, 84 y.o.   MRN: [id number]  HPI Here for a dog bite on her hand (right 3rd finger)   Had to pick up a little dog who had been hurt and it unexpectedly bit her (had to pull animal's jaws open to get finger out)  Earlier today - (4 am)  Finger hurts and it is warm Dawn Aguirre red  She put peroxide on it and alcohol  Not draining (did bleed quite a bit at first)   Her own dog  House dog -fully immunized    Wt Readings from Last 3 Encounters:  05/19/17 136 lb 12 oz (62 kg)  03/17/17 141 lb 8 oz (64.2 kg)  12/14/16 142 lb 8 oz (64.6 kg)    Temp: 99.1 F (37.3 C)   Had a Tdap vaccine 4/18   Patient Active Problem List   Diagnosis Date Noted  . Injury caused by dog bite, initial encounter 05/19/2017  . Tricuspid regurgitation 12/09/2016  . Mitral regurgitation 12/09/2016  . Normochromic anemia 12/09/2016  . Chronic abdominal pain 10/18/2016  . Urine frequency 09/09/2016  . Malaise and fatigue 09/09/2016  . Sleep disorder 01/13/2016  . Female cystocele 12/30/2015  . Left shoulder pain 09/02/2015  . Neck pain 09/02/2015  . Routine general medical examination at a health care facility 07/22/2015  . Hearing loss 07/22/2015  . Hyperglycemia 07/22/2015  . Anxiety disorder 04/24/2015  . Tremor 12/20/2014  . Chronic pain 10/14/2014  . B12 deficiency 07/16/2014  . Vitamin D deficiency 07/16/2014  . Fatigue 07/04/2014  . Encounter for Medicare annual wellness exam 01/13/2014  . Fall in home 12/24/2013  . History of breast cancer 12/18/2013  . Epiploic appendagitis 02/22/2013  . Dry mouth 02/04/2013  . Benign paroxysmal positional vertigo 09/14/2012  . Low back pain 09/07/2012  . Constipation 11/04/2011  . Aortic stenosis 08/31/2010  . Hyperlipidemia   . STRESS REACTION, ACUTE, WITH EMOTIONAL DISTURBANCE 10/12/2007  . [place] DISEASE, CERVICAL 10/12/2007  . Hypothyroidism 01/22/2007  . RAYNAUD'S SYNDROME  01/22/2007  . EXTERNAL HEMORRHOIDS 01/22/2007  . DIVERTICULOSIS, COLON 01/22/2007  . FIBROCYSTIC BREAST DISEASE 01/22/2007  . Osteoporosis 01/22/2007  . GERD 12/16/2006  . DYSPNEA ON EXERTION 12/16/2006  . Cough 12/16/2006  . TOBACCO ABUSE, HX OF 12/16/2006  . HYSTERECTOMY, HX OF 12/16/2006   Past Medical History:  Diagnosis Date  . Anxiety   . Aortic stenosis    a. mild-mod by echo 2012.  . Cancer of left breast ([place])    surgery at [facility]  . Chronic airway obstruction, not elsewhere classified   . Colitis, ischemic ([place]) 07/2000   ? infect  . Degeneration of cervical intervertebral disc   . Diffuse cystic mastopathy   . Diverticulosis of colon (without mention of hemorrhage)   . Esophageal reflux   . Essential hypertension   . External hemorrhoids without mention of complication   . Fibula fracture 2006  . Hyperlipidemia    a. intol of multiple statins.  . Lichen sclerosus    back/abd/vulvar  . Mastoiditis of right side 04/2005   admitted  . Mild mitral regurgitation 2012  . Moderate tricuspid regurgitation 2012  . Osteoporosis, unspecified   . Personal history of tobacco use, presenting hazards to health   . RA (rheumatoid arthritis) (HCC)    Sero-negative  . Raynaud's syndrome   . Spinal stenosis   . Stress at home    "caring for  son who is disabled" (12/08/2016)  . Unspecified gastritis and gastroduodenitis without mention of hemorrhage   . Unspecified hypothyroidism    Past Surgical History:  Procedure Laterality Date  . ABDOMINAL HYSTERECTOMY  1968  . BREAST BIOPSY Left   . BREAST LUMPECTOMY Left   . Cardiollite  02/2002   Neg  . CATARACT EXTRACTION W/ INTRAOCULAR LENS IMPLANT Right   . COLONOSCOPY    . COLONOSCOPY    . DEXA  2001   OP,heel  . DEXA     OP hip T-2.94  . NASAL SINUS SURGERY  10/2002   Archie Endo 06/23/2010  . Nuclear Stress Test  11/07   Normal  . SHOULDER ARTHROSCOPY W/ ROTATOR CUFF REPAIR Right 06/2005   Archie Endo 06/23/2010  . TONSILLECTOMY     . US ECHOCARDIOGRAPHY  10/06   normal LVF EF 55-65%   Social History   Tobacco Use  . Smoking status: Former Smoker    Packs/day: 2.00    Years: 10.00    Pack years: 20.00    Types: Cigarettes    Last attempt to quit: 02/07/1962    Years since quitting: 55.3  . Smokeless tobacco: Never Used  Substance Use Topics  . Alcohol use: Yes    Alcohol/week: 0.0 oz    Comment: 12/08/2016 "glass of wine a couple times/month"  . Drug use: No   Family History  Problem Relation Age of Onset  . Heart attack Father 37  . Coronary artery disease Sister 71       CABG  . Heart attack Brother 8  . Coronary artery disease Unknown        GF   Allergies  Allergen Reactions  . Ace Inhibitors     REACTION: cough  . Dm-Apap-Cpm Nausea Only  . Ezetimibe     REACTION: myalgia  . Ezetimibe-Simvastatin     REACTION: joints swell  . Macrobid [Nitrofurantoin]     ABD PAIN, ALSO BODY ACHES/PAIN  . Moxifloxacin   . Rosuvastatin     REACTION: myalgia  . Septra [Sulfamethoxazole-Trimethoprim]     Sick feeling   . Simvastatin     REACTION: myalgia  . Triaminic    Current Outpatient Medications on File Prior to Visit  Medication Sig Dispense Refill  . busPIRone (BUSPAR) 30 MG tablet Take 1 tablet (30 mg total) by mouth 2 (two) times daily. (Patient taking differently: Take 30 mg by mouth as needed. ) 60 tablet 11  . cefpodoxime (VANTIN) 200 MG tablet Take 1 tablet (200 mg total) by mouth 2 (two) times daily. For 6days 12 tablet 0  . cyanocobalamin ([employer] VITAMIN B12) 2000 MCG tablet Take 2,000 mcg by mouth daily.    Marland Kitchen estradiol (ESTRACE) 0.1 MG/GM vaginal cream Place 1 Applicatorful vaginally at bedtime.    . hydrOXYzine (ATARAX/VISTARIL) 25 MG tablet Take 1 tablet (25 mg total) by mouth 3 (three) times daily as needed. For severe itching, caution of sedation 30 tablet 1  . ibuprofen (ADVIL,MOTRIN) 200 MG tablet Take 1 tablet (200 mg total) by mouth daily as needed for mild pain. Alternate with  hydrocodone for knee    . levothyroxine (SYNTHROID, LEVOTHROID) 100 MCG tablet Take 1 tablet (100 mcg total) by mouth daily. 90 tablet 1  . Polyethylene Glycol 3350 (MIRALAX PO) Take 17 g by mouth as needed.     . triamcinolone cream (KENALOG) 0.5 % Apply 1 application topically 3 (three) times daily as needed. 30 g 0   No current facility-administered  medications on file prior to visit.     Review of Systems     Objective:   Physical Exam  Constitutional: She appears well-developed and well-nourished. No distress.  HENT:  Head: Normocephalic and atraumatic.  Eyes: Pupils are equal, round, and reactive to light. Conjunctivae and EOM are normal. Right eye exhibits no discharge. Left eye exhibits no discharge. No scleral icterus.  Neck: Normal range of motion. Neck supple.  Cardiovascular: Normal rate, regular rhythm and normal heart sounds.  Pulmonary/Chest: Effort normal and breath sounds normal. No respiratory distress. She has no wheezes.  Musculoskeletal: She exhibits no deformity.  Lymphadenopathy:    She has no cervical adenopathy.  Neurological: She is alert. She displays tremor. No cranial nerve deficit.  Skin: Skin is warm and dry. No rash noted. There is erythema. No pallor.  2 puncture wounds on both sides of distal R 3rd finger  Mild swelling and erythema and tenderness No drainage or pus  Nl rom of finger with discomfort  No fb noted    Psychiatric: She has a normal mood and affect.          Assessment & Plan:   Problem List Items Addressed This Visit      Other   Injury caused by dog bite, initial encounter - Primary    R middle finger- 2 puncture wounds w/o fb Happened early this am  Own dog-immunized Some redness/swelling  Disc goal to avoid infection/pasturella tx with augmentin  Soap and water cleans- dress loosely with abx oint if needed Red flags for infection discussed  See avs  Update if not starting to improve in a week or if worsening

## 2017-05-19 NOTE — Patient Instructions (Signed)
Keep wound clean with soap and water  Antibiotic ointment (triple or poly) and loose bandage is fine  Do not submerge in water until healed   Elevate when you can  Cool compress may help the pain and swelling   Take the augmentin as directed for infection   If worse Redness/ swelling/pain or fever - alert [place] and if necessary- go the ER if after hours  Also watch for any streaking of redness up hand or arm   Call on Monday - leave a message to let me know how it is doing

## 2017-05-20 NOTE — Assessment & Plan Note (Addendum)
R middle finger- 2 puncture wounds w/o fb Happened early this am  Own dog-immunized Some redness/swelling  Disc goal to avoid infection/pasturella tx with augmentin  Soap and water cleans- dress loosely with abx oint if needed Red flags for infection discussed  See avs  Update if not starting to improve in a week or if worsening

## 2017-06-02 DIAGNOSIS — K5902 Outlet dysfunction constipation: Secondary | ICD-10-CM | POA: Diagnosis not present

## 2017-06-05 ENCOUNTER — Ambulatory Visit (INDEPENDENT_AMBULATORY_CARE_PROVIDER_SITE_OTHER)
Admission: RE | Admit: 2017-06-05 | Discharge: 2017-06-05 | Disposition: A | Discharge status: Home / Self Care | Payer: Medicare HMO | Source: Ambulatory Visit | Attending: Family Medicine | Admitting: Family Medicine

## 2017-06-05 ENCOUNTER — Ambulatory Visit (INDEPENDENT_AMBULATORY_CARE_PROVIDER_SITE_OTHER): Payer: Medicare HMO | Admitting: Family Medicine

## 2017-06-05 ENCOUNTER — Encounter: Payer: Self-pay | Admitting: Family Medicine

## 2017-06-05 VITALS — BP 144/72 | HR 72 | Temp 98.4°F | Ht 61.5 in | Wt 133.5 lb

## 2017-06-05 DIAGNOSIS — M79641 Pain in right hand: Secondary | ICD-10-CM

## 2017-06-05 DIAGNOSIS — W540XXA Bitten by dog, initial encounter: Secondary | ICD-10-CM

## 2017-06-05 DIAGNOSIS — S61451A Open bite of right hand, initial encounter: Secondary | ICD-10-CM | POA: Diagnosis not present

## 2017-06-05 DIAGNOSIS — S60415A Abrasion of left ring finger, initial encounter: Secondary | ICD-10-CM | POA: Insufficient documentation

## 2017-06-05 MED ORDER — TRAMADOL HCL 50 MG PO TABS: 50.0000 mg | ORAL_TABLET | Freq: Three times a day (TID) | ORAL | 0 refills | Status: DC | PRN

## 2017-06-05 MED ORDER — AMOXICILLIN-POT CLAVULANATE 875-125 MG PO TABS: 1.0000 | ORAL_TABLET | Freq: Two times a day (BID) | ORAL | 0 refills | Status: DC

## 2017-06-05 NOTE — Assessment & Plan Note (Signed)
From tussle with small dog (no bite on this hand)  Scab at tip of finger- reassuring exam  No s/s of infection  Adv to keep clean with soap and water- lightly cover

## 2017-06-05 NOTE — Patient Instructions (Signed)
Keep wounds clean with soap and water -do not submerge  Antibiotic ointment is fine  Take the augmentin as directed   Xray now - I will have a reading by tomorrow am  Also refer to ortho/hand specialist   Try the tramadol for pain with caution of sedation and constipation  Elevate your hand whenever possible

## 2017-06-05 NOTE — Progress Notes (Signed)
Subjective:    Patient ID: Dawn Aguirre, female    DOB: 10-Aug-1925, 84 y.o.   MRN: [id number]  HPI Here with finger injuries   Previous injury was R 3rd finger-last tx with augmentin (2 weeks ago)  It improved but never 100% better  Injured it again on Saturday -dog got caught under a chair -had to pull her out  Hit it coming out - bumped again wood and metal  Now very sore and sensitive   Took some motrin   Now injured L hand - also Saturday night  Tip of the 4th finger (not a bite) just injury -has a nick there    Has an elderly dog - who has bitten several times They are going to have to put it down unfortunately    Patient Active Problem List   Diagnosis Date Noted  . Right hand pain 06/05/2017  . Injury caused by dog bite, initial encounter 05/19/2017  . Tricuspid regurgitation 12/09/2016  . Mitral regurgitation 12/09/2016  . Normochromic anemia 12/09/2016  . Chronic abdominal pain 10/18/2016  . Urine frequency 09/09/2016  . Malaise and fatigue 09/09/2016  . Sleep disorder 01/13/2016  . Female cystocele 12/30/2015  . Left shoulder pain 09/02/2015  . Neck pain 09/02/2015  . Routine general medical examination at a health care facility 07/22/2015  . Hearing loss 07/22/2015  . Hyperglycemia 07/22/2015  . Anxiety disorder 04/24/2015  . Tremor 12/20/2014  . Chronic pain 10/14/2014  . B12 deficiency 07/16/2014  . Vitamin D deficiency 07/16/2014  . Fatigue 07/04/2014  . Encounter for Medicare annual wellness exam 01/13/2014  . Fall in home 12/24/2013  . History of breast cancer 12/18/2013  . Epiploic appendagitis 02/22/2013  . Dry mouth 02/04/2013  . Benign paroxysmal positional vertigo 09/14/2012  . Low back pain 09/07/2012  . Constipation 11/04/2011  . Aortic stenosis 08/31/2010  . Hyperlipidemia   . STRESS REACTION, ACUTE, WITH EMOTIONAL DISTURBANCE 10/12/2007  . [place] DISEASE, CERVICAL 10/12/2007  . Hypothyroidism 01/22/2007  . RAYNAUD'S SYNDROME  01/22/2007  . EXTERNAL HEMORRHOIDS 01/22/2007  . DIVERTICULOSIS, COLON 01/22/2007  . FIBROCYSTIC BREAST DISEASE 01/22/2007  . Osteoporosis 01/22/2007  . GERD 12/16/2006  . DYSPNEA ON EXERTION 12/16/2006  . Cough 12/16/2006  . TOBACCO ABUSE, HX OF 12/16/2006  . HYSTERECTOMY, HX OF 12/16/2006   Past Medical History:  Diagnosis Date  . Anxiety   . Aortic stenosis    a. mild-mod by echo 2012.  . Cancer of left breast ([place])    surgery at [facility]  . Chronic airway obstruction, not elsewhere classified   . Colitis, ischemic ([place]) 07/2000   ? infect  . Degeneration of cervical intervertebral disc   . Diffuse cystic mastopathy   . Diverticulosis of colon (without mention of hemorrhage)   . Esophageal reflux   . Essential hypertension   . External hemorrhoids without mention of complication   . Fibula fracture 2006  . Hyperlipidemia    a. intol of multiple statins.  . Lichen sclerosus    back/abd/vulvar  . Mastoiditis of right side 04/2005   admitted  . Mild mitral regurgitation 2012  . Moderate tricuspid regurgitation 2012  . Osteoporosis, unspecified   . Personal history of tobacco use, presenting hazards to health   . RA (rheumatoid arthritis) (HCC)    Sero-negative  . Raynaud's syndrome   . Spinal stenosis   . Stress at home    "caring for son who is disabled" (12/08/2016)  . Unspecified gastritis and gastroduodenitis  without mention of hemorrhage   . Unspecified hypothyroidism    Past Surgical History:  Procedure Laterality Date  . ABDOMINAL HYSTERECTOMY  1968  . BREAST BIOPSY Left   . BREAST LUMPECTOMY Left   . Cardiollite  02/2002   Neg  . CATARACT EXTRACTION W/ INTRAOCULAR LENS IMPLANT Right   . COLONOSCOPY    . COLONOSCOPY    . DEXA  2001   OP,heel  . DEXA     OP hip T-2.94  . NASAL SINUS SURGERY  10/2002   Archie Endo 06/23/2010  . Nuclear Stress Test  11/07   Normal  . SHOULDER ARTHROSCOPY W/ ROTATOR CUFF REPAIR Right 06/2005   Archie Endo 06/23/2010  . TONSILLECTOMY     . US ECHOCARDIOGRAPHY  10/06   normal LVF EF 55-65%   Social History   Tobacco Use  . Smoking status: Former Smoker    Packs/day: 2.00    Years: 10.00    Pack years: 20.00    Types: Cigarettes    Last attempt to quit: 02/07/1962    Years since quitting: 55.3  . Smokeless tobacco: Never Used  Substance Use Topics  . Alcohol use: Yes    Alcohol/week: 0.0 oz    Comment: 12/08/2016 "glass of wine a couple times/month"  . Drug use: No   Family History  Problem Relation Age of Onset  . Heart attack Father 59  . Coronary artery disease Sister 20       CABG  . Heart attack Brother 86  . Coronary artery disease Unknown        GF   Allergies  Allergen Reactions  . Ace Inhibitors     REACTION: cough  . Dm-Apap-Cpm Nausea Only  . Ezetimibe     REACTION: myalgia  . Ezetimibe-Simvastatin     REACTION: joints swell  . Macrobid [Nitrofurantoin]     ABD PAIN, ALSO BODY ACHES/PAIN  . Moxifloxacin   . Rosuvastatin     REACTION: myalgia  . Septra [Sulfamethoxazole-Trimethoprim]     Sick feeling   . Simvastatin     REACTION: myalgia  . Triaminic    Current Outpatient Medications on File Prior to Visit  Medication Sig Dispense Refill  . busPIRone (BUSPAR) 30 MG tablet Take 1 tablet (30 mg total) by mouth 2 (two) times daily. (Patient taking differently: Take 30 mg by mouth as needed. ) 60 tablet 11  . cyanocobalamin ([employer]S VITAMIN B12) 2000 MCG tablet Take 2,000 mcg by mouth daily.    Marland Kitchen levothyroxine (SYNTHROID, LEVOTHROID) 100 MCG tablet Take 1 tablet (100 mcg total) by mouth daily. 90 tablet 1  . Polyethylene Glycol 3350 (MIRALAX PO) Take 17 g by mouth daily.      No current facility-administered medications on file prior to visit.     Review of Systems  Constitutional: Positive for fatigue. Negative for activity change, appetite change, fever and unexpected weight change.  HENT: Negative for congestion.   Eyes: Negative for pain, redness and visual disturbance.  Respiratory:  Negative for cough, shortness of breath and wheezing.   Cardiovascular: Negative for chest pain and palpitations.  Gastrointestinal: Positive for constipation. Negative for abdominal pain, blood in stool, diarrhea and nausea.  Endocrine: Negative for polydipsia and polyuria.  Genitourinary: Negative for dysuria.  Musculoskeletal: Negative for arthralgias, back pain and myalgias.  Skin: Negative for pallor and rash.       Pos for dog bite wound on R middle finger with redness and swelling   Smaller non bite  wound on L 4th finger   Allergic/Immunologic: Negative for environmental allergies.  Neurological: Negative for dizziness, syncope and headaches.  Hematological: Negative for adenopathy. Does not bruise/bleed easily.  Psychiatric/Behavioral: Negative for decreased concentration and dysphoric mood. The patient is not nervous/anxious.        Objective:   Physical Exam  Constitutional: She appears well-developed and well-nourished.  HENT:  Head: Normocephalic and atraumatic.  Eyes: Pupils are equal, round, and reactive to light. Conjunctivae are normal.  Neck: Normal range of motion. Neck supple.  Cardiovascular: Normal rate and regular rhythm.  Murmur heard. Pulmonary/Chest: Effort normal and breath sounds normal. No respiratory distress. She has no wheezes. She has no rales.  Musculoskeletal: She exhibits edema and tenderness.  R 3rd finger- the previous puncture wounds are healing w/o drainage Diffuse swelling of finger with mild erythema 1/2 way up  No fluctuance  Tender to palpation  Pain to bend finger past 20 degrees (but it can be bent passively)  Nl perfusion and sensation   L 4th finger-small scab from scrape at tip  No drainage or swelling or erythema   Lymphadenopathy:    She has no cervical adenopathy.  Skin: There is erythema.  Psychiatric:  Pleasant  Mildly anxious           Assessment & Plan:   Problem List Items Addressed This Visit       Musculoskeletal and Integument   Abrasion of left ring finger    From tussle with small dog (no bite on this hand)  Scab at tip of finger- reassuring exam  No s/s of infection  Adv to keep clean with soap and water- lightly cover         Other   Injury caused by dog bite, initial encounter - Primary    Was tx with augmentin (with improvement) 2 weeks ago after occurrence  Improved and now re injured (2 d ago)- blunt trauma - again became red and swollen  Re tx with augmentin course xr of finger today to r/o fx or osteomyelitis  Warm compresses/soap and water cleanse Ref to hand specialist -urgent  Watch for inc in pain/redness/swelling or any streaks Also watch for fever or malaise        Relevant Orders   DG Hand Complete Right   Ambulatory referral to Orthopedic Surgery   Right hand pain    S/p 3rd finger dog bite 2 weeks ago and now blunt trauma  rom limited by pain  Re tx with augmentin  xr today  Urgent ref to hand specialist   Px for tramadol for pain for use with caution of sedation and constipation       Relevant Orders   DG Hand Complete Right   Ambulatory referral to Orthopedic Surgery

## 2017-06-05 NOTE — Assessment & Plan Note (Signed)
Was tx with augmentin (with improvement) 2 weeks ago after occurrence  Improved and now re injured (2 d ago)- blunt trauma - again became red and swollen  Re tx with augmentin course xr of finger today to r/o fx or osteomyelitis  Warm compresses/soap and water cleanse Ref to hand specialist -urgent  Watch for inc in pain/redness/swelling or any streaks Also watch for fever or malaise

## 2017-06-05 NOTE — Assessment & Plan Note (Addendum)
S/p 3rd finger dog bite 2 weeks ago and now blunt trauma  rom limited by pain  Re tx with augmentin  xr today  Urgent ref to hand specialist   Px for tramadol for pain for use with caution of sedation and constipation

## 2017-06-07 DIAGNOSIS — S61202A Unspecified open wound of right middle finger without damage to nail, initial encounter: Secondary | ICD-10-CM | POA: Diagnosis not present

## 2017-09-03 IMAGING — CR DG ABDOMEN 2V
4 series · 4 of 4 positions shown · non-contrast
Comparison: Abdominal and pelvic CT scan February 13, 2014

CLINICAL DATA: Epigastric abdominal pain and bloating

EXAM:
ABDOMEN - 2 VIEW

[view not recorded (1 of 4)]
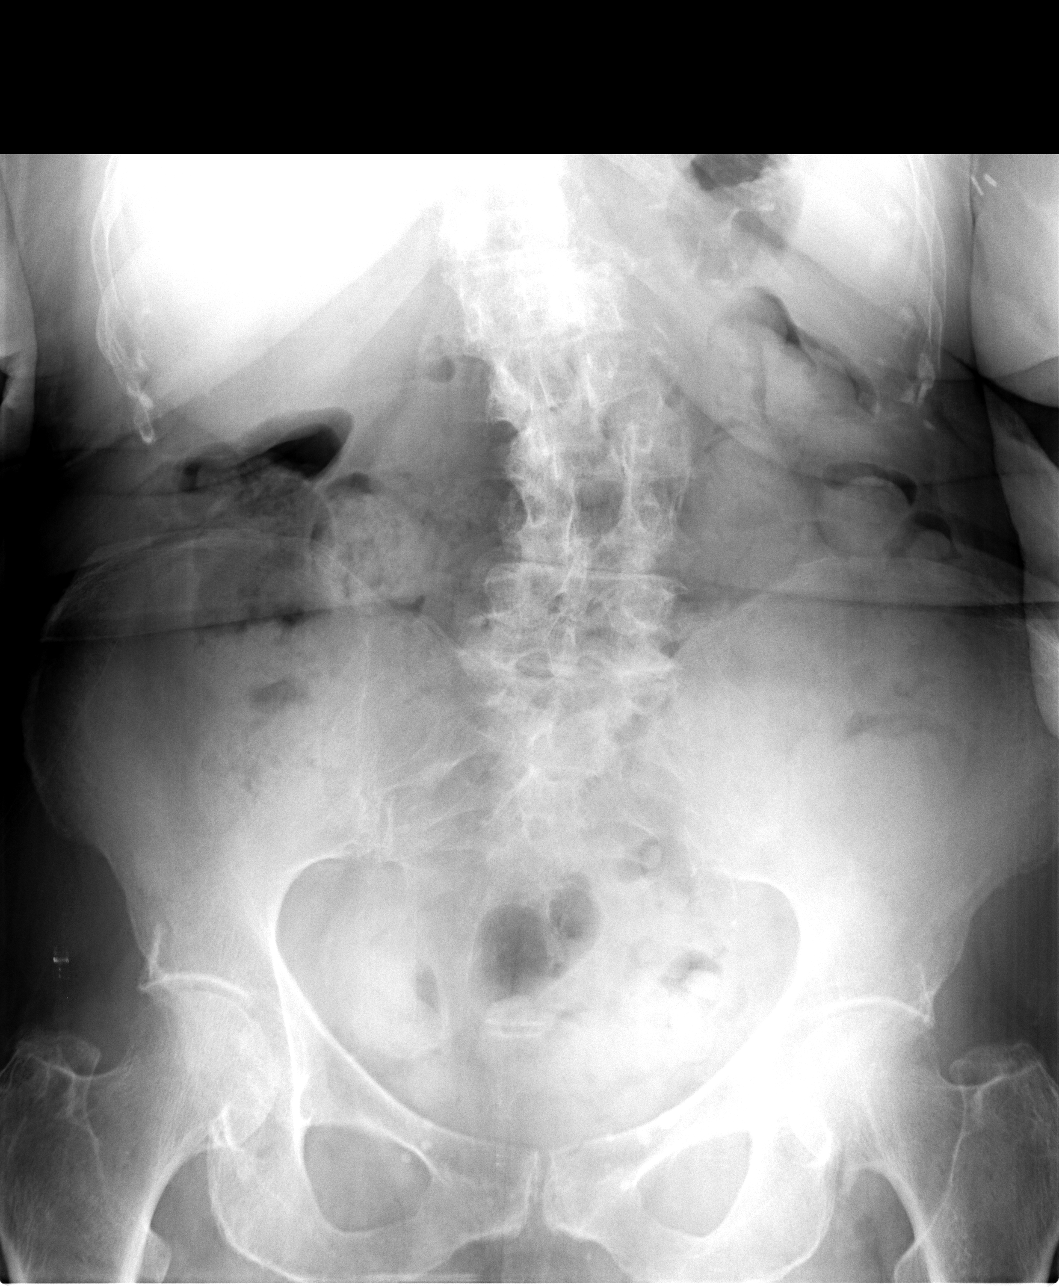

[view not recorded (2 of 4)]
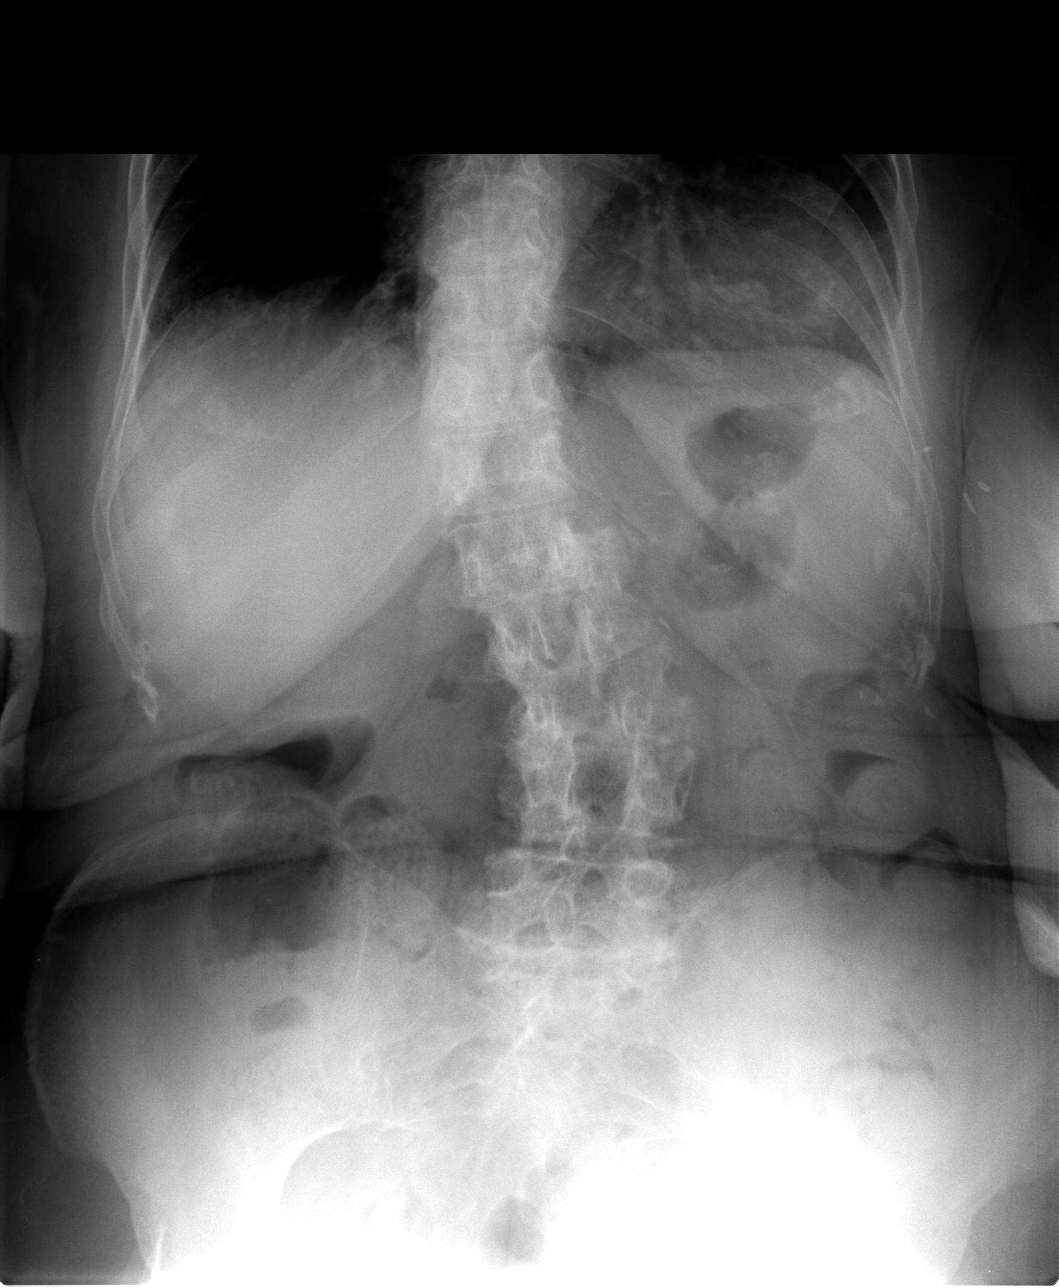

[view not recorded (3 of 4)]
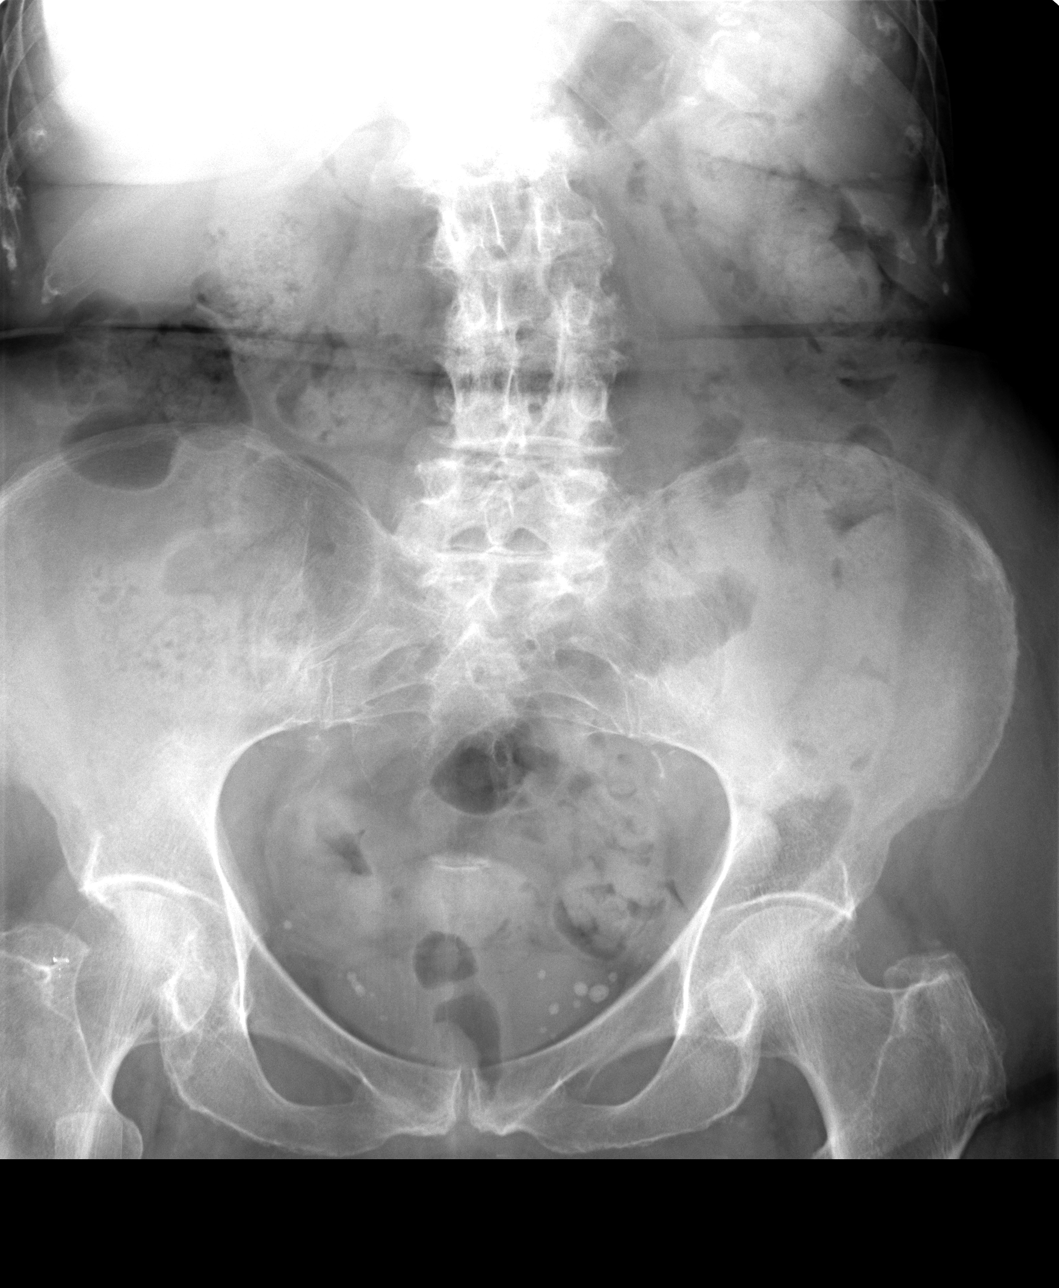

[view not recorded (4 of 4)]
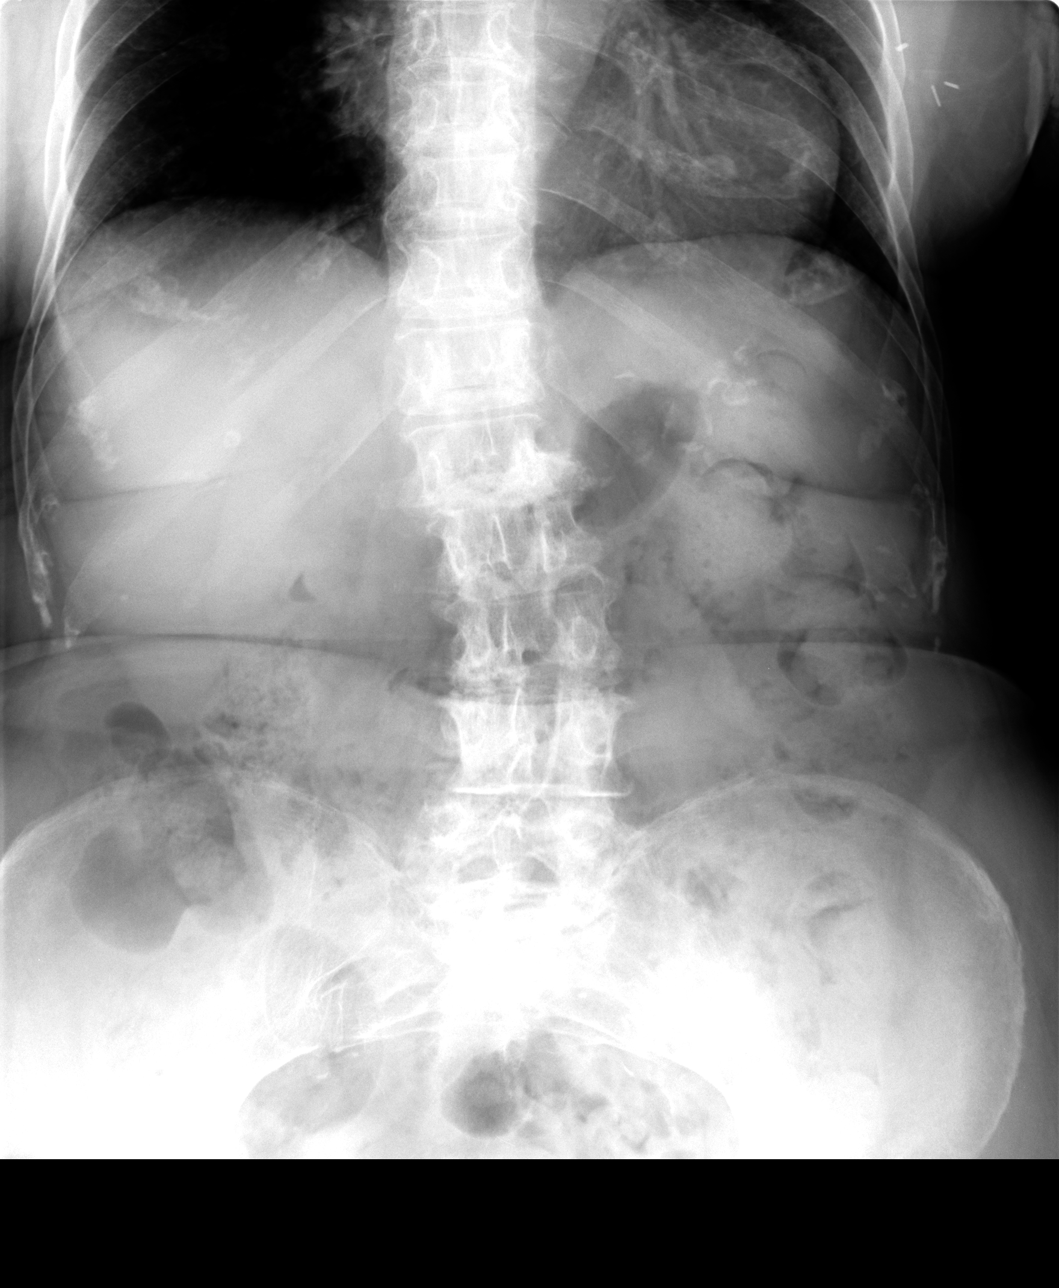

[4 of 4 positions shown; findings below may reference images not displayed]

FINDINGS: The colonic stool burden is moderately increased. There is no small
or large bowel obstructive pattern. There is no free extraluminal
gas. There are no abnormal soft tissue calcifications. There is
S-shaped thoracolumbar scoliosis with moderate multilevel
degenerative disc disease. There is mild hip joint space narrowing
greatest on the left.
IMPRESSION: No bowel obstruction is observed. Increased colonic stool burden is
consistent with constipation in the appropriate clinical setting.

## 2017-09-29 ENCOUNTER — Encounter (HOSPITAL_COMMUNITY): Payer: Self-pay | Admitting: Emergency Medicine

## 2017-09-29 ENCOUNTER — Ambulatory Visit: Payer: Self-pay | Admitting: Family Medicine

## 2017-09-29 ENCOUNTER — Emergency Department (HOSPITAL_COMMUNITY): Payer: Medicare HMO

## 2017-09-29 ENCOUNTER — Emergency Department (HOSPITAL_COMMUNITY)
Admission: EM | Admit: 2017-09-29 | Discharge: 2017-09-29 | Disposition: A | Discharge status: Home / Self Care | Payer: Medicare HMO | Attending: Emergency Medicine | Admitting: Emergency Medicine

## 2017-09-29 DIAGNOSIS — E039 Hypothyroidism, unspecified: Secondary | ICD-10-CM | POA: Insufficient documentation

## 2017-09-29 DIAGNOSIS — I160 Hypertensive urgency: Secondary | ICD-10-CM | POA: Diagnosis not present

## 2017-09-29 DIAGNOSIS — K5900 Constipation, unspecified: Secondary | ICD-10-CM | POA: Diagnosis not present

## 2017-09-29 DIAGNOSIS — I10 Essential (primary) hypertension: Secondary | ICD-10-CM | POA: Diagnosis not present

## 2017-09-29 DIAGNOSIS — K219 Gastro-esophageal reflux disease without esophagitis: Secondary | ICD-10-CM | POA: Diagnosis not present

## 2017-09-29 DIAGNOSIS — R030 Elevated blood-pressure reading, without diagnosis of hypertension: Secondary | ICD-10-CM | POA: Diagnosis not present

## 2017-09-29 DIAGNOSIS — Z853 Personal history of malignant neoplasm of breast: Secondary | ICD-10-CM | POA: Insufficient documentation

## 2017-09-29 DIAGNOSIS — Z87891 Personal history of nicotine dependence: Secondary | ICD-10-CM | POA: Insufficient documentation

## 2017-09-29 DIAGNOSIS — R69 Illness, unspecified: Secondary | ICD-10-CM | POA: Diagnosis not present

## 2017-09-29 DIAGNOSIS — J449 Chronic obstructive pulmonary disease, unspecified: Secondary | ICD-10-CM | POA: Diagnosis not present

## 2017-09-29 DIAGNOSIS — Z79899 Other long term (current) drug therapy: Secondary | ICD-10-CM | POA: Insufficient documentation

## 2017-09-29 DIAGNOSIS — Z947 Corneal transplant status: Secondary | ICD-10-CM | POA: Diagnosis not present

## 2017-09-29 LAB — I-STAT CHEM 8, ED
BUN: 20 mg/dL (ref 8–23)
Calcium, Ion: 1.18 mmol/L (ref 1.15–1.40)
Chloride: 104 mmol/L (ref 98–111)
Creatinine, Ser: 0.8 mg/dL (ref 0.44–1.00)
Glucose, Bld: 99 mg/dL (ref 70–99)
HCT: 34 % — ABNORMAL LOW (ref 36.0–46.0)
Hemoglobin: 11.6 g/dL — ABNORMAL LOW (ref 12.0–15.0)
Potassium: 3.9 mmol/L (ref 3.5–5.1)
Sodium: 140 mmol/L (ref 135–145)
TCO2: 26 mmol/L (ref 22–32)

## 2017-09-29 LAB — BASIC METABOLIC PANEL
Anion gap: 9 (ref 5–15)
BUN: 20 mg/dL (ref 8–23)
CO2: 28 mmol/L (ref 22–32)
Calcium: 9.4 mg/dL (ref 8.9–10.3)
Chloride: 106 mmol/L (ref 98–111)
Creatinine, Ser: 0.91 mg/dL (ref 0.44–1.00)
GFR calc Af Amer: >60 mL/min (ref >60)
GFR calc non Af Amer: 54 mL/min — ABNORMAL LOW (ref >60)
Glucose, Bld: 103 mg/dL — ABNORMAL HIGH (ref 70–99)
Potassium: 3.9 mmol/L (ref 3.5–5.1)
Sodium: 143 mmol/L (ref 135–145)

## 2017-09-29 LAB — URINALYSIS, ROUTINE W REFLEX MICROSCOPIC
Bacteria, UA: NONE SEEN
Bilirubin Urine: NEGATIVE
Glucose, UA: NEGATIVE mg/dL
Ketones, ur: NEGATIVE mg/dL
Leukocytes, UA: NEGATIVE
Nitrite: NEGATIVE
Protein, ur: NEGATIVE mg/dL
Specific Gravity, Urine: 1.004 — ABNORMAL LOW (ref 1.005–1.030)
pH: 5 (ref 5.0–8.0)

## 2017-09-29 LAB — CBC
HCT: 35.7 % — ABNORMAL LOW (ref 36.0–46.0)
Hemoglobin: 11.5 g/dL — ABNORMAL LOW (ref 12.0–15.0)
MCH: 28.8 pg (ref 26.0–34.0)
MCHC: 32.2 g/dL (ref 30.0–36.0)
MCV: 89.5 fL (ref 78.0–100.0)
Platelets: 164 10*3/uL (ref 150–400)
RBC: 3.99 MIL/uL (ref 3.87–5.11)
RDW: 13.3 % (ref 11.5–15.5)
WBC: 4.9 10*3/uL (ref 4.0–10.5)

## 2017-09-29 LAB — I-STAT TROPONIN, ED: Troponin i, poc: 0.01 ng/mL (ref 0.00–0.08)

## 2017-09-29 MED ORDER — IRBESARTAN 75 MG PO TABS
75.0000 mg | ORAL_TABLET | Freq: Every day | ORAL | Status: DC
  Administered 2017-09-29: 75 mg via ORAL
  Filled 2017-09-29: qty 1

## 2017-09-29 MED ORDER — OLMESARTAN MEDOXOMIL-HCTZ 20-12.5 MG PO TABS: 1.0000 | ORAL_TABLET | Freq: Every day | ORAL | 0 refills | Status: DC

## 2017-09-29 NOTE — Discharge Instructions (Signed)
You are seen in the emergency department for elevated blood pressure reading.  Your lab work, chest x-ray, EKG, heart enzymes do not show any signs of acute injury.  We will restart you on the blood pressure medicine you have been on previously.  Take this tablet daily.  You need to schedule a primary care doctor appointment to recheck your blood pressure as you may need some medication adjustments.  Return to the ER for severe, sudden headache, vision changes, nausea, vomiting, chest pain, shortness of breath, cough, severe abdominal or back pain, difficulty with speech or walking, loss of sensation or weakness to your extremities.

## 2017-09-29 NOTE — Telephone Encounter (Signed)
Will follow in epic

## 2017-09-29 NOTE — ED Provider Notes (Signed)
[facility] DEPT Provider Note   CSN: [id number] Arrival date & time: 09/29/17  1347     History   Chief Complaint Chief Complaint  Patient presents with  . Hypertension    HPI Dawn Aguirre is a 84 y.o. female with history of hypothyroidism, arthritis, hyperlipidemia, aortic stenosis, raynauds syndrome is here for evaluation of elevated blood pressure reading at home.  Home RN measured her blood pressure and it was 218 over 90s.  States that she used to be on antihypertensive medications but not for greater than 2 or 3 years.  States that her blood pressure usually goes up when she is anxious or stressed.  She has a son with Down syndrome that is now living in her home and she admits to being very stressed out with the relocation.  She noticed sharp, central chest pain yesterday for 1 to 2 minutes that resolved spontaneously without intervention.  She cannot remember what she was doing but thinks she was probably running around working at home.  There was no associated lightheadedness, nausea, vomiting, sweats, radiation of the pain to the jaw or arms, shortness of breath.  She has had a mild, dull headache to the top of her scalp for the last 2 to 3 days that she also attributes to being anxious and stressed out lately.  Currently she denies any symptoms including headache, vision changes, nausea, vomiting, chest pain, shortness of breath, back or abdominal pain.  HPI  Past Medical History:  Diagnosis Date  . Anxiety   . Aortic stenosis    a. mild-mod by echo 2012.  . Cancer of left breast ([place])    surgery at [facility]  . Chronic airway obstruction, not elsewhere classified   . Colitis, ischemic ([place]) 07/2000   ? infect  . Degeneration of cervical intervertebral disc   . Diffuse cystic mastopathy   . Diverticulosis of colon (without mention of hemorrhage)   . Esophageal reflux   . Essential hypertension   . External hemorrhoids without mention of  complication   . Fibula fracture 2006  . Hyperlipidemia    a. intol of multiple statins.  . Lichen sclerosus    back/abd/vulvar  . Mastoiditis of right side 04/2005   admitted  . Mild mitral regurgitation 2012  . Moderate tricuspid regurgitation 2012  . Osteoporosis, unspecified   . Personal history of tobacco use, presenting hazards to health   . RA (rheumatoid arthritis) ([facility])    Sero-negative  . Raynaud's syndrome   . Spinal stenosis   . Stress at home    "caring for son who is disabled" (12/08/2016)  . Unspecified gastritis and gastroduodenitis without mention of hemorrhage   . Unspecified hypothyroidism     Patient Active Problem List   Diagnosis Date Noted  . Right hand pain 06/05/2017  . Abrasion of left ring finger 06/05/2017  . Injury caused by dog bite, initial encounter 05/19/2017  . Tricuspid regurgitation 12/09/2016  . Mitral regurgitation 12/09/2016  . Normochromic anemia 12/09/2016  . Chronic abdominal pain 10/18/2016  . Urine frequency 09/09/2016  . Malaise and fatigue 09/09/2016  . Sleep disorder 01/13/2016  . Female cystocele 12/30/2015  . Left shoulder pain 09/02/2015  . Neck pain 09/02/2015  . Routine general medical examination at a health care facility 07/22/2015  . Hearing loss 07/22/2015  . Hyperglycemia 07/22/2015  . Anxiety disorder 04/24/2015  . Tremor 12/20/2014  . Chronic pain 10/14/2014  . B12 deficiency 07/16/2014  . Vitamin D  deficiency 07/16/2014  . Fatigue 07/04/2014  . Encounter for Medicare annual wellness exam 01/13/2014  . Fall in home 12/24/2013  . History of breast cancer 12/18/2013  . Epiploic appendagitis 02/22/2013  . Dry mouth 02/04/2013  . Benign paroxysmal positional vertigo 09/14/2012  . Low back pain 09/07/2012  . Constipation 11/04/2011  . Aortic stenosis 08/31/2010  . Hyperlipidemia   . STRESS REACTION, ACUTE, WITH EMOTIONAL DISTURBANCE 10/12/2007  . [place] DISEASE, CERVICAL 10/12/2007  . Hypothyroidism  01/22/2007  . RAYNAUD'S SYNDROME 01/22/2007  . EXTERNAL HEMORRHOIDS 01/22/2007  . DIVERTICULOSIS, COLON 01/22/2007  . FIBROCYSTIC BREAST DISEASE 01/22/2007  . Osteoporosis 01/22/2007  . GERD 12/16/2006  . DYSPNEA ON EXERTION 12/16/2006  . Cough 12/16/2006  . TOBACCO ABUSE, HX OF 12/16/2006  . HYSTERECTOMY, HX OF 12/16/2006    Past Surgical History:  Procedure Laterality Date  . ABDOMINAL HYSTERECTOMY  1968  . BREAST BIOPSY Left   . BREAST LUMPECTOMY Left   . Cardiollite  02/2002   Neg  . CATARACT EXTRACTION W/ INTRAOCULAR LENS IMPLANT Right   . COLONOSCOPY    . COLONOSCOPY    . DEXA  2001   OP,heel  . DEXA     OP hip T-2.94  . NASAL SINUS SURGERY  10/2002   Archie Endo 06/23/2010  . Nuclear Stress Test  11/07   Normal  . SHOULDER ARTHROSCOPY W/ ROTATOR CUFF REPAIR Right 06/2005   Archie Endo 06/23/2010  . TONSILLECTOMY    . US ECHOCARDIOGRAPHY  10/06   normal LVF EF 55-65%     OB History   None      Home Medications    Prior to Admission medications   Medication Sig Start Date End Date Taking? Authorizing Provider  amoxicillin-clavulanate (AUGMENTIN) 875-125 MG tablet Take 1 tablet by mouth 2 (two) times daily. 06/05/17   Tower, Wynelle Fanny, MD  busPIRone (BUSPAR) 30 MG tablet Take 1 tablet (30 mg total) by mouth 2 (two) times daily. Patient taking differently: Take 30 mg by mouth as needed.  10/18/16   Tower, Wynelle Fanny, MD  cyanocobalamin ([employer] VITAMIN B12) 2000 MCG tablet Take 2,000 mcg by mouth daily.    [provider]  levothyroxine (SYNTHROID, LEVOTHROID) 100 MCG tablet Take 1 tablet (100 mcg total) by mouth daily. 11/11/16   Tower, Wynelle Fanny, MD  olmesartan-hydrochlorothiazide (BENICAR HCT) 20-12.5 MG tablet Take 1 tablet by mouth daily for 15 days. 09/29/17 10/14/17  Kinnie Feil, PA-C  Polyethylene Glycol 3350 (MIRALAX PO) Take 17 g by mouth daily.     [provider]  traMADol (ULTRAM) 50 MG tablet Take 1 tablet (50 mg total) by mouth every 8 (eight) hours  as needed for moderate pain or severe pain. Caution of sedation and constipation 06/05/17   Tower, Wynelle Fanny, MD    Family History Family History  Problem Relation Age of Onset  . Heart attack Father 94  . Coronary artery disease Sister 45       CABG  . Heart attack Brother 74  . Coronary artery disease Unknown        GF    Social History Social History   Tobacco Use  . Smoking status: Former Smoker    Packs/day: 2.00    Years: 10.00    Pack years: 20.00    Types: Cigarettes    Last attempt to quit: 02/07/1962    Years since quitting: 55.6  . Smokeless tobacco: Never Used  Substance Use Topics  . Alcohol use: Yes  Alcohol/week: 0.0 standard drinks    Comment: 12/08/2016 "glass of wine a couple times/month"  . Drug use: No     Allergies   Ace inhibitors; Dm-apap-cpm; Ezetimibe; Ezetimibe-simvastatin; Macrobid [nitrofurantoin]; Moxifloxacin; Rosuvastatin; Septra [sulfamethoxazole-trimethoprim]; Simvastatin; and Triaminic   Review of Systems Review of Systems  Cardiovascular: Positive for chest pain (no currently).  Neurological: Positive for headaches (not currently).  All other systems reviewed and are negative.    Physical Exam Updated Vital Signs BP (!) 195/56 (BP Location: Right Arm)   Pulse 71   Temp 97.8 F (36.6 C) (Oral)   Resp 18   SpO2 99%   Physical Exam  Constitutional: She appears well-developed and well-nourished.  Very well appearing elderly female. Easily moves to and from bed without assistance.   HENT:  Head: Normocephalic and atraumatic.  Nose: Nose normal.  Eyes: Conjunctivae, EOM and lids are normal.  Neck: Trachea normal and normal range of motion.  Trachea midline. No obvious JVD.   Cardiovascular: Normal rate, regular rhythm, S1 normal, S2 normal and normal heart sounds.  Pulses:      Carotid pulses are 2+ on the right side, and 2+ on the left side.      Radial pulses are 2+ on the right side, and 2+ on the left side.        Dorsalis pedis pulses are 2+ on the right side, and 2+ on the left side.  Trace symmetric pitting edema to distal tib/fib. No calf tenderness. 2+ DP and radial pulses bilaterally.  Pulmonary/Chest: Effort normal and breath sounds normal.  No orthopnea with HOB flat.   Abdominal: Soft. Bowel sounds are normal. There is no tenderness.  No epigastric tenderness. No distention.   Neurological: She is alert. GCS eye subscore is 4. GCS verbal subscore is 5. GCS motor subscore is 6.  Skin: Skin is warm and dry. Capillary refill takes less than 2 seconds.  Psychiatric: Her speech is normal and behavior is normal. Thought content normal. Cognition and memory are normal.     ED Treatments / Results  Labs (all labs ordered are listed, but only abnormal results are displayed) Labs Reviewed  BASIC METABOLIC PANEL - Abnormal; Notable for the following components:      Result Value   Glucose, Bld 103 (*)    GFR calc non Af Amer 54 (*)    All other components within normal limits  CBC - Abnormal; Notable for the following components:   Hemoglobin 11.5 (*)    HCT 35.7 (*)    All other components within normal limits  URINALYSIS, ROUTINE W REFLEX MICROSCOPIC - Abnormal; Notable for the following components:   Color, Urine STRAW (*)    Specific Gravity, Urine 1.004 (*)    Hgb urine dipstick SMALL (*)    All other components within normal limits  I-STAT CHEM 8, ED - Abnormal; Notable for the following components:   Hemoglobin 11.6 (*)    HCT 34.0 (*)    All other components within normal limits  I-STAT TROPONIN, ED    EKG None  Radiology Dg Chest Port 1 View  Result Date: 09/29/2017 CLINICAL DATA:  Hypertension EXAM: PORTABLE CHEST 1 VIEW COMPARISON:  CTA chest dated 12/09/2016 FINDINGS: Lungs are clear.  No pleural effusion or pneumothorax. The heart is normal in size. Surgical clips overlying the left lower hemithorax/breast. IMPRESSION: No evidence of acute cardiopulmonary disease.  Electronically Signed   By: Julian Hy M.D.   On: 09/29/2017 15:22  Procedures Procedures (including critical care time)  Medications Ordered in ED Medications  irbesartan (AVAPRO) tablet 75 mg (75 mg Oral Given 09/29/17 1509)     Initial Impression / Assessment and Plan / ED Course  I have reviewed the triage vital signs and the nursing notes.  Pertinent labs & imaging results that were available during my care of the patient were reviewed by me and considered in my medical decision making (see chart for details).  Clinical Course as of Sep 29 1649  Fri Sep 29, 2017  1441 benicar hct 20-12.5  as of December 2016   [CG]    Clinical Course User Index [CG] Kinnie Feil, PA-C    84 y.o. yo female with elevated BP readings in setting of previous dx of HTN not on current antihypertensive regimen.  No current clinical features to suggest end organ dysfunction: no thunderclap HA, seizures, stroke symptoms, vision changes, CP, SOB, cough, orthopnea, LE edema, back/abdominal pain, hematuria.   Labs and imaging reviewed unremarkable. No signs of new, acute, severe EOD.   BP has remained stable. Pt remains asymptomatic in ER. Oral antihypertensive given. No further indication for aggressive BP control or further emergent work up.  Will dc with f/u with benicar which she has tolerated well in the past and PCP within one week for recheck of BP, may need med adjustment. Chart and available pertinent old records, if available, reviewed by me. Imaging and labs in ER viewed and interpreted by me and used in the medical decision making with formal interpretation from radiologist. Discharge home in stable condition, return precautions discussed.  Patient agreeable with plan for discharge home. Shared visit with Dr. Tomi Bamberger.    Final Clinical Impressions(s) / ED Diagnoses   Final diagnoses:  Hypertensive urgency    ED Discharge Orders         Ordered    olmesartan-hydrochlorothiazide  (BENICAR HCT) 20-12.5 MG tablet  Daily     09/29/17 1557           Kinnie Feil, PA-C 09/29/17 1651    Dorie Rank, MD 10/02/17 [phone]

## 2017-09-29 NOTE — Telephone Encounter (Signed)
Elevated BP- 210/100, 190/90- patient states insurance nurse was out today and she took her BP-  She reports it was very high. Patient states she has no symptoms- she has been under a lot of stress recently with her son being very sick. Patient wants to come in for BP check- no openings at office and per protocol patient needs appointment today- advised UC and follow up at office. Patient states she will have someone take her today. Patient reports she has been under a lot of stress with home modifications and son in hospice care.  Reason for Disposition . [1] Systolic BP  >= 771 OR Diastolic >= 165  AND [7] having NO cardiac or neurologic symptoms  Answer Assessment - Initial Assessment Questions 1. BLOOD PRESSURE: "What is the blood pressure?" "Did you take at least two measurements 5 minutes apart?"    136/84, 157/90 - wrist cuff 2. ONSET: "When did you take your blood pressure?"     11:30 3. HOW: "How did you obtain the blood pressure?" (e.g., visiting nurse, automatic home BP monitor)     automaticcuff 4. HISTORY: "Do you have a history of high blood pressure?"     In the past- 1 year 5. MEDICATIONS: "Are you taking any medications for blood pressure?" "Have you missed any doses recently?"     no 6. OTHER SYMPTOMS: "Do you have any symptoms?" (e.g., headache, chest pain, blurred vision, difficulty breathing, weakness)     Hyper- patient has been in negotiations with work on house, not sleeping well 7. PREGNANCY: "Is there any chance you are pregnant?" "When was your last menstrual period?"     n/a  Protocols used: HIGH BLOOD PRESSURE-A-AH

## 2017-09-29 NOTE — ED Notes (Signed)
Bed: WA01 Expected date:  Expected time:  Means of arrival:  Comments: Triage 2

## 2017-09-29 NOTE — ED Triage Notes (Signed)
Patient here from home with complaints of hypertension. Reports that the insurance nurse came out to check vitals and noticed that BP was elevated. States that she did take BP meds "a long time ago". Denies chest pain.

## 2017-10-02 ENCOUNTER — Telehealth: Payer: Self-pay | Admitting: Family Medicine

## 2017-10-02 NOTE — Telephone Encounter (Signed)
Pt notified of Dr. Tower's comments  

## 2017-10-02 NOTE — Telephone Encounter (Signed)
Copied from [facility] [id number]. Topic: Quick Communication - See Telephone Encounter >> Oct 02, 2017 10:10 AM Neva Seat wrote: [facility] - [phone]   On Friday 09-29-17 at home - pt was advised to go to hospital.  Pt may have taken herself off all medications. May follow up with pt. 210/100 - BP 75 - pulse

## 2017-10-02 NOTE — Telephone Encounter (Signed)
I spoke with pt; pt was seen 09/29/17 at [facility] ED; I spoke with pt pt took BP this morning at 1:00 AM BP was 144/87 and at 6:00 Am today BP 173/49. Pt is taking her BP medication and has company coming into town today and cannot see Dr Glori Bickers until 10/06/17. Pt scheduled ED FU for hypertension on 10/06/17 at 12 noon; FYI to Dr Glori Bickers.

## 2017-10-02 NOTE — Telephone Encounter (Signed)
Please tell her to increase her bp medicine (generic benicar hct 20-12.5) to 2 pills once daily -this may help in the interim  If she needs more to get by until she comes in-please send it in Thanks

## 2017-10-06 ENCOUNTER — Encounter: Payer: Self-pay | Admitting: Family Medicine

## 2017-10-06 ENCOUNTER — Ambulatory Visit (INDEPENDENT_AMBULATORY_CARE_PROVIDER_SITE_OTHER): Payer: Medicare HMO | Admitting: Family Medicine

## 2017-10-06 VITALS — BP 131/68 | HR 78 | Temp 98.5°F | Ht 61.5 in | Wt 134.2 lb

## 2017-10-06 DIAGNOSIS — R4589 Other symptoms and signs involving emotional state: Secondary | ICD-10-CM

## 2017-10-06 DIAGNOSIS — I10 Essential (primary) hypertension: Secondary | ICD-10-CM | POA: Diagnosis not present

## 2017-10-06 DIAGNOSIS — R69 Illness, unspecified: Secondary | ICD-10-CM | POA: Diagnosis not present

## 2017-10-06 DIAGNOSIS — E039 Hypothyroidism, unspecified: Secondary | ICD-10-CM

## 2017-10-06 LAB — BASIC METABOLIC PANEL
BUN: 22 mg/dL (ref 6–23)
CO2: 28 mEq/L (ref 19–32)
Calcium: 9.3 mg/dL (ref 8.4–10.5)
Chloride: 104 mEq/L (ref 96–112)
Creatinine, Ser: 1.18 mg/dL (ref 0.40–1.20)
GFR: 45.69 mL/min — ABNORMAL LOW (ref >60.00)
Glucose, Bld: 95 mg/dL (ref 70–99)
Potassium: 4 mEq/L (ref 3.5–5.1)
Sodium: 140 mEq/L (ref 135–145)

## 2017-10-06 LAB — TSH: TSH: 25.02 u[IU]/mL — ABNORMAL HIGH (ref 0.35–4.50)

## 2017-10-06 MED ORDER — OLMESARTAN MEDOXOMIL-HCTZ 20-12.5 MG PO TABS: 1.0000 | ORAL_TABLET | Freq: Every day | ORAL | 11 refills | Status: DC

## 2017-10-06 NOTE — Assessment & Plan Note (Signed)
Caring for disabled son and elderly husband Reviewed stressors/ coping techniques/symptoms/ support sources/ tx options and side effects in detail today Overall good coping skills and support  Does affect bp at times Reminded her to take buspar if helpful  Also imp of self care

## 2017-10-06 NOTE — Patient Instructions (Addendum)
You need a pill box with the days of the week   Always take your thyroid medicine first thing in the am before breakfast or other medicines  Keep medicines organized   Lab today for the bp medicine and for thyroid   Work on fluid intake  Take your miralax every day

## 2017-10-06 NOTE — Assessment & Plan Note (Addendum)
bp in fair control at this time  BP Readings from Last 1 Encounters:  10/06/17 131/68   No changes needed Most recent labs reviewed  Disc lifstyle change with low sodium diet and exercise  Better on benicar-hct  Reviewed hospital records, lab results and studies in detail   Lab today for bmet  Disc imp of water intake Disc DASH eating plan  Also reasons for tx HTN-handout given  When systolic goes up at home-no doubt stress continues

## 2017-10-06 NOTE — Progress Notes (Signed)
Subjective:    Patient ID: Dawn Aguirre, female    DOB: 1925/07/27, 84 y.o.   MRN: [id number]  HPI Here for f/u of ED visit for elevated bp   Thought stress was greatly elevating her bp  She presented on 8/23 after getting a bp reading of 218/90 at home  Did not have symptoms from it   In ED bp was 195/56 She was tx with oral benicar 20-12.5 mg  Was d/c with this   Lab Results  Component Value Date   CREATININE 0.80 09/29/2017   BUN 20 09/29/2017   NA 140 09/29/2017   K 3.9 09/29/2017   CL 104 09/29/2017   CO2 28 09/29/2017   Lab Results  Component Value Date   WBC 4.9 09/29/2017   HGB 11.6 (L) 09/29/2017   HCT 34.0 (L) 09/29/2017   MCV 89.5 09/29/2017   PLT 164 09/29/2017  stable from previous    UA was normal   CXR: Dg Chest Port 1 View  Result Date: 09/29/2017 CLINICAL DATA:  Hypertension EXAM: PORTABLE CHEST 1 VIEW COMPARISON:  CTA chest dated 12/09/2016 FINDINGS: Lungs are clear.  No pleural effusion or pneumothorax. The heart is normal in size. Surgical clips overlying the left lower hemithorax/breast. IMPRESSION: No evidence of acute cardiopulmonary disease. Electronically Signed   By: Julian Hy M.D.   On: 09/29/2017 15:22   BP is improved greatly here today BP Readings from Last 3 Encounters:  10/06/17 134/78  09/29/17 (!) 195/56  06/05/17 (!) 144/72  benicar hct - tolerates well    Taking buspar as needed for anxiety  Much stress taking care of Down's Syndrome son with dementia  Also worried about her elderly husband  Nephew lives with them now and he is very helpful   Wt Readings from Last 3 Encounters:  10/06/17 134 lb 4 oz (60.9 kg)  06/05/17 133 lb 8 oz (60.6 kg)  05/19/17 136 lb 12 oz (62 kg)   24.96 kg/m    Due for TSH for hypothyroid Missed dose this am- but generally takes w/o missing    Patient Active Problem List   Diagnosis Date Noted  . Hypertension, essential 10/06/2017  . Right hand pain 06/05/2017  .  Abrasion of left ring finger 06/05/2017  . Injury caused by dog bite, initial encounter 05/19/2017  . Tricuspid regurgitation 12/09/2016  . Mitral regurgitation 12/09/2016  . Normochromic anemia 12/09/2016  . Chronic abdominal pain 10/18/2016  . Urine frequency 09/09/2016  . Malaise and fatigue 09/09/2016  . Sleep disorder 01/13/2016  . Female cystocele 12/30/2015  . Left shoulder pain 09/02/2015  . Neck pain 09/02/2015  . Routine general medical examination at a health care facility 07/22/2015  . Hearing loss 07/22/2015  . Hyperglycemia 07/22/2015  . Anxiety disorder 04/24/2015  . Tremor 12/20/2014  . Chronic pain 10/14/2014  . B12 deficiency 07/16/2014  . Vitamin D deficiency 07/16/2014  . Fatigue 07/04/2014  . Encounter for Medicare annual wellness exam 01/13/2014  . Fall in home 12/24/2013  . History of breast cancer 12/18/2013  . Epiploic appendagitis 02/22/2013  . Dry mouth 02/04/2013  . Benign paroxysmal positional vertigo 09/14/2012  . Low back pain 09/07/2012  . Constipation 11/04/2011  . Aortic stenosis 08/31/2010  . Hyperlipidemia   . STRESS REACTION, ACUTE, WITH EMOTIONAL DISTURBANCE 10/12/2007  . [place] DISEASE, CERVICAL 10/12/2007  . Hypothyroidism 01/22/2007  . RAYNAUD'S SYNDROME 01/22/2007  . EXTERNAL HEMORRHOIDS 01/22/2007  . DIVERTICULOSIS, COLON 01/22/2007  . FIBROCYSTIC BREAST  DISEASE 01/22/2007  . Osteoporosis 01/22/2007  . GERD 12/16/2006  . DYSPNEA ON EXERTION 12/16/2006  . Cough 12/16/2006  . TOBACCO ABUSE, HX OF 12/16/2006  . HYSTERECTOMY, HX OF 12/16/2006   Past Medical History:  Diagnosis Date  . Anxiety   . Aortic stenosis    a. mild-mod by echo 2012.  . Cancer of left breast ([place])    surgery at [facility]  . Chronic airway obstruction, not elsewhere classified   . Colitis, ischemic ([place]) 07/2000   ? infect  . Degeneration of cervical intervertebral disc   . Diffuse cystic mastopathy   . Diverticulosis of colon (without mention of  hemorrhage)   . Esophageal reflux   . Essential hypertension   . External hemorrhoids without mention of complication   . Fibula fracture 2006  . Hyperlipidemia    a. intol of multiple statins.  . Lichen sclerosus    back/abd/vulvar  . Mastoiditis of right side 04/2005   admitted  . Mild mitral regurgitation 2012  . Moderate tricuspid regurgitation 2012  . Osteoporosis, unspecified   . Personal history of tobacco use, presenting hazards to health   . RA (rheumatoid arthritis) (HCC)    Sero-negative  . Raynaud's syndrome   . Spinal stenosis   . Stress at home    "caring for son who is disabled" (12/08/2016)  . Unspecified gastritis and gastroduodenitis without mention of hemorrhage   . Unspecified hypothyroidism    Past Surgical History:  Procedure Laterality Date  . ABDOMINAL HYSTERECTOMY  1968  . BREAST BIOPSY Left   . BREAST LUMPECTOMY Left   . Cardiollite  02/2002   Neg  . CATARACT EXTRACTION W/ INTRAOCULAR LENS IMPLANT Right   . COLONOSCOPY    . COLONOSCOPY    . DEXA  2001   OP,heel  . DEXA     OP hip T-2.94  . NASAL SINUS SURGERY  10/2002   Archie Endo 06/23/2010  . Nuclear Stress Test  11/07   Normal  . SHOULDER ARTHROSCOPY W/ ROTATOR CUFF REPAIR Right 06/2005   Archie Endo 06/23/2010  . TONSILLECTOMY    . US ECHOCARDIOGRAPHY  10/06   normal LVF EF 55-65%   Social History   Tobacco Use  . Smoking status: Former Smoker    Packs/day: 2.00    Years: 10.00    Pack years: 20.00    Types: Cigarettes    Last attempt to quit: 02/07/1962    Years since quitting: 55.6  . Smokeless tobacco: Never Used  Substance Use Topics  . Alcohol use: Yes    Alcohol/week: 0.0 standard drinks    Comment: 12/08/2016 "glass of wine a couple times/month"  . Drug use: No   Family History  Problem Relation Age of Onset  . Heart attack Father 26  . Coronary artery disease Sister 57       CABG  . Heart attack Brother 47  . Coronary artery disease Unknown        GF   Allergies    Allergen Reactions  . Ace Inhibitors     REACTION: cough  . Dm-Apap-Cpm Nausea Only  . Ezetimibe     REACTION: myalgia  . Ezetimibe-Simvastatin     REACTION: joints swell  . Macrobid [Nitrofurantoin]     ABD PAIN, ALSO BODY ACHES/PAIN  . Moxifloxacin   . Rosuvastatin     REACTION: myalgia  . Septra [Sulfamethoxazole-Trimethoprim]     Sick feeling   . Simvastatin     REACTION: myalgia  .  Triaminic    Current Outpatient Medications on File Prior to Visit  Medication Sig Dispense Refill  . busPIRone (BUSPAR) 30 MG tablet Take 1 tablet (30 mg total) by mouth 2 (two) times daily. (Patient taking differently: Take 30 mg by mouth as needed. ) 60 tablet 11  . cyanocobalamin ([employer] VITAMIN B12) 2000 MCG tablet Take 2,000 mcg by mouth daily.    Marland Kitchen levothyroxine (SYNTHROID, LEVOTHROID) 100 MCG tablet Take 1 tablet (100 mcg total) by mouth daily. 90 tablet 1  . Polyethylene Glycol 3350 (MIRALAX PO) Take 17 g by mouth daily.     . traMADol (ULTRAM) 50 MG tablet Take 1 tablet (50 mg total) by mouth every 8 (eight) hours as needed for moderate pain or severe pain. Caution of sedation and constipation 30 tablet 0   No current facility-administered medications on file prior to visit.     Review of Systems  Constitutional: Negative for activity change, appetite change, fatigue, fever and unexpected weight change.  HENT: Negative for congestion, ear pain, rhinorrhea, sinus pressure and sore throat.   Eyes: Negative for pain, redness and visual disturbance.  Respiratory: Negative for cough, shortness of breath and wheezing.   Cardiovascular: Negative for chest pain and palpitations.  Gastrointestinal: Positive for constipation. Negative for abdominal pain, blood in stool and diarrhea.  Endocrine: Negative for polydipsia and polyuria.  Genitourinary: Negative for dysuria, frequency and urgency.  Musculoskeletal: Negative for arthralgias, back pain and myalgias.  Skin: Negative for pallor and rash.   Allergic/Immunologic: Negative for environmental allergies.  Neurological: Negative for dizziness, syncope and headaches.  Hematological: Negative for adenopathy. Does not bruise/bleed easily.  Psychiatric/Behavioral: Positive for decreased concentration. Negative for dysphoric mood. The patient is nervous/anxious.        Objective:   Physical Exam  Constitutional: She appears well-developed and well-nourished. No distress.  Well appearing elderly female   HENT:  Head: Normocephalic and atraumatic.  Mouth/Throat: Oropharynx is clear and moist.  Eyes: Pupils are equal, round, and reactive to light. Conjunctivae and EOM are normal.  Neck: Normal range of motion. Neck supple. No JVD present. Carotid bruit is not present. No thyromegaly present.  Cardiovascular: Normal rate, regular rhythm, normal heart sounds and intact distal pulses. Exam reveals no gallop.  Pulmonary/Chest: Effort normal and breath sounds normal. No respiratory distress. She has no wheezes. She has no rales.  No crackles  Abdominal: Soft. Bowel sounds are normal. She exhibits no distension, no abdominal bruit and no mass. There is no tenderness.  Musculoskeletal: She exhibits no edema.  Lymphadenopathy:    She has no cervical adenopathy.  Neurological: She is alert. She has normal reflexes. She displays normal reflexes. No cranial nerve deficit. Coordination normal.  Mild tremor/hand  Skin: Skin is warm and dry. Capillary refill takes less than 2 seconds. No rash noted.  Nl color and turgor  Psychiatric: She has a normal mood and affect.  Mood is good today  Less anxious than last visit           Assessment & Plan:   Problem List Items Addressed This Visit      Cardiovascular and Mediastinum   Hypertension, essential - Primary    bp in fair control at this time  BP Readings from Last 1 Encounters:  10/06/17 131/68   No changes needed Most recent labs reviewed  Disc lifstyle change with low sodium diet  and exercise  Better on benicar-hct  Reviewed hospital records, lab results and studies in detail  Lab today for bmet  Disc imp of water intake Disc DASH eating plan  Also reasons for tx HTN-handout given  When systolic goes up at home-no doubt stress continues      Relevant Medications   olmesartan-hydrochlorothiazide (BENICAR HCT) 20-12.5 MG tablet   Other Relevant Orders   Basic metabolic panel   TSH     Endocrine   Hypothyroidism    Due for TSH Pt states she missed one dose this am- otherwise compliant  No clinical changes       Relevant Orders   TSH     Other   STRESS REACTION, ACUTE, WITH EMOTIONAL DISTURBANCE    Caring for disabled son and elderly husband Reviewed stressors/ coping techniques/symptoms/ support sources/ tx options and side effects in detail today Overall good coping skills and support  Does affect bp at times Reminded her to take buspar if helpful  Also imp of self care

## 2017-10-06 NOTE — Assessment & Plan Note (Signed)
Due for TSH Pt states she missed one dose this am- otherwise compliant  No clinical changes

## 2017-11-05 ENCOUNTER — Telehealth: Payer: Self-pay | Admitting: Family Medicine

## 2017-11-05 DIAGNOSIS — E039 Hypothyroidism, unspecified: Secondary | ICD-10-CM

## 2017-11-05 NOTE — Telephone Encounter (Signed)
-----   Message from Lendon Collar, RT sent at 10/30/2017  9:59 AM EDT ----- Regarding: Lab orders for Thursday 11/09/17 Please enter lab orders for 11/09/17. Lab schedule states that patient is coming in for TSH. Thanks!

## 2017-11-09 ENCOUNTER — Other Ambulatory Visit (INDEPENDENT_AMBULATORY_CARE_PROVIDER_SITE_OTHER): Payer: Medicare HMO

## 2017-11-09 ENCOUNTER — Encounter (INDEPENDENT_AMBULATORY_CARE_PROVIDER_SITE_OTHER): Payer: Self-pay

## 2017-11-09 DIAGNOSIS — E039 Hypothyroidism, unspecified: Secondary | ICD-10-CM

## 2017-11-09 LAB — TSH: TSH: 12.03 u[IU]/mL — ABNORMAL HIGH (ref 0.35–4.50)

## 2017-12-05 ENCOUNTER — Other Ambulatory Visit (INDEPENDENT_AMBULATORY_CARE_PROVIDER_SITE_OTHER): Payer: Medicare HMO

## 2017-12-05 DIAGNOSIS — E039 Hypothyroidism, unspecified: Secondary | ICD-10-CM | POA: Diagnosis not present

## 2017-12-05 LAB — TSH: TSH: 4.38 u[IU]/mL (ref 0.35–4.50)

## 2017-12-05 NOTE — Addendum Note (Signed)
Addended by: Lendon Collar on: 12/05/2017 01:27 PM   Modules accepted: Orders

## 2017-12-06 ENCOUNTER — Encounter: Payer: Self-pay | Admitting: *Deleted

## 2017-12-14 ENCOUNTER — Other Ambulatory Visit: Payer: Medicare HMO

## 2018-02-16 DIAGNOSIS — S92405A Nondisplaced unspecified fracture of left great toe, initial encounter for closed fracture: Secondary | ICD-10-CM | POA: Diagnosis not present

## 2018-02-20 ENCOUNTER — Telehealth: Payer: Self-pay

## 2018-02-20 NOTE — Telephone Encounter (Signed)
Romie Minus (DPR signed) said that pt is sure pt had flu shot on one of the visits for labs. I do not see documentation of flu shot being given but pt per Romie Minus is sure she had flu shot. Please advise. Romie Minus request cb.

## 2018-02-21 NOTE — Telephone Encounter (Signed)
Spoke with daughter and advise her no flu shot on file so she has scheduled pt a flu shot appt tomorrow

## 2018-02-22 ENCOUNTER — Ambulatory Visit (INDEPENDENT_AMBULATORY_CARE_PROVIDER_SITE_OTHER): Payer: Medicare HMO

## 2018-02-22 DIAGNOSIS — Z23 Encounter for immunization: Secondary | ICD-10-CM | POA: Diagnosis not present

## 2018-03-08 DIAGNOSIS — S92405D Nondisplaced unspecified fracture of left great toe, subsequent encounter for fracture with routine healing: Secondary | ICD-10-CM | POA: Diagnosis not present

## 2018-03-13 ENCOUNTER — Other Ambulatory Visit: Payer: Self-pay | Admitting: Family Medicine

## 2018-03-13 DIAGNOSIS — M7062 Trochanteric bursitis, left hip: Secondary | ICD-10-CM | POA: Diagnosis not present

## 2018-03-13 DIAGNOSIS — M48061 Spinal stenosis, lumbar region without neurogenic claudication: Secondary | ICD-10-CM | POA: Diagnosis not present

## 2018-03-13 DIAGNOSIS — M5412 Radiculopathy, cervical region: Secondary | ICD-10-CM | POA: Diagnosis not present

## 2018-05-30 ENCOUNTER — Ambulatory Visit (INDEPENDENT_AMBULATORY_CARE_PROVIDER_SITE_OTHER): Payer: Medicare HMO | Admitting: Family Medicine

## 2018-05-30 ENCOUNTER — Other Ambulatory Visit: Payer: Medicare HMO

## 2018-05-30 ENCOUNTER — Other Ambulatory Visit: Payer: Self-pay

## 2018-05-30 ENCOUNTER — Encounter: Payer: Self-pay | Admitting: Family Medicine

## 2018-05-30 DIAGNOSIS — R300 Dysuria: Secondary | ICD-10-CM

## 2018-05-30 DIAGNOSIS — R3915 Urgency of urination: Secondary | ICD-10-CM | POA: Diagnosis not present

## 2018-05-30 LAB — POC URINALSYSI DIPSTICK (AUTOMATED)
Bilirubin, UA: NEGATIVE
Glucose, UA: NEGATIVE
Ketones, UA: NEGATIVE
Leukocytes, UA: NEGATIVE
Nitrite, UA: NEGATIVE
Protein, UA: NEGATIVE
Spec Grav, UA: >=1.03 — AB (ref 1.010–1.025)
Urobilinogen, UA: 0.2 E.U./dL
pH, UA: 5.5 (ref 5.0–8.0)

## 2018-05-30 NOTE — Progress Notes (Signed)
Virtual Visit via Video Note  I connected with Dawn Aguirre on 05/30/18 at  4:15 PM EDT by a video enabled telemedicine application and verified that I am speaking with the correct person using two identifiers. She is in her home today  I am in my office at [place] I was able to connect to Dawn Aguirre through a cell phone but she could not activate the video portion-so the visit was conducted by phone  I discussed the limitations of evaluation and management by telemedicine and the availability of in person appointments. The patient expressed understanding and agreed to proceed.  History of Present Illness: Presents with urinary symptoms   More urinary frequency for "a while" - goes to the bathroom every 2 hours occ achy  Some dysuria-not bad  No blood in urine   No fever or malaise  No nausea or vomiting  Has not been sleeping well  No flank pain  No odor to urine    Lab Results  Component Value Date   CREATININE 1.18 10/06/2017   BUN 22 10/06/2017   NA 140 10/06/2017   K 4.0 10/06/2017   CL 104 10/06/2017   CO2 28 10/06/2017   GFR of 45.6  UA today: Results for orders placed or performed in visit on 05/30/18  POCT Urinalysis Dipstick (Automated)  Result Value Ref Range   Color, UA yellow    Clarity, UA hazy    Glucose, UA Negative Negative   Bilirubin, UA neg    Ketones, UA neg    Spec Grav, UA >=1.030 (A) 1.010 - 1.025   Blood, UA small    pH, UA 5.5 5.0 - 8.0   Protein, UA Negative Negative   Urobilinogen, UA 0.2 0.2 or 1.0 E.U./dL   Nitrite, UA neg    Leukocytes, UA Negative Negative    Urine is concentrated  She drinks only water -2 glasses per day  Drinks coffee - 4-5 cups per day at least    Review of Systems  Constitutional: Positive for malaise/fatigue. Negative for chills, fever and weight loss.  Respiratory: Negative for cough and shortness of breath.   Cardiovascular: Negative for chest pain and palpitations.  Gastrointestinal: Negative  for abdominal pain, diarrhea, nausea and vomiting.  Genitourinary: Positive for dysuria and frequency. Negative for flank pain and hematuria.  Skin: Negative for itching and rash.  Neurological: Negative for dizziness and headaches.  Psychiatric/Behavioral: The patient is nervous/anxious.     Patient Active Problem List   Diagnosis Date Noted  . Hypertension, essential 10/06/2017  . Right hand pain 06/05/2017  . Abrasion of left ring finger 06/05/2017  . Injury caused by dog bite, initial encounter 05/19/2017  . Tricuspid regurgitation 12/09/2016  . Mitral regurgitation 12/09/2016  . Normochromic anemia 12/09/2016  . Chronic abdominal pain 10/18/2016  . Urine frequency 09/09/2016  . Malaise and fatigue 09/09/2016  . Sleep disorder 01/13/2016  . Female cystocele 12/30/2015  . Left shoulder pain 09/02/2015  . Neck pain 09/02/2015  . Routine general medical examination at a health care facility 07/22/2015  . Hearing loss 07/22/2015  . Hyperglycemia 07/22/2015  . Anxiety disorder 04/24/2015  . Tremor 12/20/2014  . Chronic pain 10/14/2014  . B12 deficiency 07/16/2014  . Vitamin D deficiency 07/16/2014  . Fatigue 07/04/2014  . Encounter for Medicare annual wellness exam 01/13/2014  . Fall in home 12/24/2013  . History of breast cancer 12/18/2013  . Epiploic appendagitis 02/22/2013  . Dry mouth 02/04/2013  .  Benign paroxysmal positional vertigo 09/14/2012  . Low back pain 09/07/2012  . Constipation 11/04/2011  . Aortic stenosis 08/31/2010  . Hyperlipidemia   . STRESS REACTION, ACUTE, WITH EMOTIONAL DISTURBANCE 10/12/2007  . [place] DISEASE, CERVICAL 10/12/2007  . Hypothyroidism 01/22/2007  . RAYNAUD'S SYNDROME 01/22/2007  . EXTERNAL HEMORRHOIDS 01/22/2007  . DIVERTICULOSIS, COLON 01/22/2007  . FIBROCYSTIC BREAST DISEASE 01/22/2007  . Osteoporosis 01/22/2007  . GERD 12/16/2006  . DYSPNEA ON EXERTION 12/16/2006  . Cough 12/16/2006  . TOBACCO ABUSE, HX OF 12/16/2006  .  HYSTERECTOMY, HX OF 12/16/2006   Past Medical History:  Diagnosis Date  . Anxiety   . Aortic stenosis    a. mild-mod by echo 2012.  . Cancer of left breast ([place])    surgery at [facility]  . Chronic airway obstruction, not elsewhere classified   . Colitis, ischemic ([place]) 07/2000   ? infect  . Degeneration of cervical intervertebral disc   . Diffuse cystic mastopathy   . Diverticulosis of colon (without mention of hemorrhage)   . Esophageal reflux   . Essential hypertension   . External hemorrhoids without mention of complication   . Fibula fracture 2006  . Hyperlipidemia    a. intol of multiple statins.  . Lichen sclerosus    back/abd/vulvar  . Mastoiditis of right side 04/2005   admitted  . Mild mitral regurgitation 2012  . Moderate tricuspid regurgitation 2012  . Osteoporosis, unspecified   . Personal history of tobacco use, presenting hazards to health   . RA (rheumatoid arthritis) ([facility])    Sero-negative  . Raynaud's syndrome   . Spinal stenosis   . Stress at home    "caring for son who is disabled" (12/08/2016)  . Unspecified gastritis and gastroduodenitis without mention of hemorrhage   . Unspecified hypothyroidism    Past Surgical History:  Procedure Laterality Date  . ABDOMINAL HYSTERECTOMY  1968  . BREAST BIOPSY Left   . BREAST LUMPECTOMY Left   . Cardiollite  02/2002   Neg  . CATARACT EXTRACTION W/ INTRAOCULAR LENS IMPLANT Right   . COLONOSCOPY    . COLONOSCOPY    . DEXA  2001   OP,heel  . DEXA     OP hip T-2.94  . NASAL SINUS SURGERY  10/2002   Archie Endo 06/23/2010  . Nuclear Stress Test  11/07   Normal  . SHOULDER ARTHROSCOPY W/ ROTATOR CUFF REPAIR Right 06/2005   Archie Endo 06/23/2010  . TONSILLECTOMY    . US ECHOCARDIOGRAPHY  10/06   normal LVF EF 55-65%   Social History   Tobacco Use  . Smoking status: Former Smoker    Packs/day: 2.00    Years: 10.00    Pack years: 20.00    Types: Cigarettes    Last attempt to quit: 02/07/1962    Years since quitting:  56.3  . Smokeless tobacco: Never Used  Substance Use Topics  . Alcohol use: Yes    Alcohol/week: 0.0 standard drinks    Comment: 12/08/2016 "glass of wine a couple times/month"  . Drug use: No   Family History  Problem Relation Age of Onset  . Heart attack Father 26  . Coronary artery disease Sister 43       CABG  . Heart attack Brother 4  . Coronary artery disease Unknown        GF   Allergies  Allergen Reactions  . Ace Inhibitors     REACTION: cough  . Dm-Apap-Cpm Nausea Only  . Ezetimibe  REACTION: myalgia  . Ezetimibe-Simvastatin     REACTION: joints swell  . Macrobid [Nitrofurantoin]     ABD PAIN, ALSO BODY ACHES/PAIN  . Moxifloxacin   . Rosuvastatin     REACTION: myalgia  . Septra [Sulfamethoxazole-Trimethoprim]     Sick feeling   . Simvastatin     REACTION: myalgia  . Triaminic    Current Outpatient Medications on File Prior to Visit  Medication Sig Dispense Refill  . busPIRone (BUSPAR) 30 MG tablet Take 1 tablet (30 mg total) by mouth 2 (two) times daily. (Patient taking differently: Take 30 mg by mouth as needed. ) 60 tablet 11  . cyanocobalamin ([employer] VITAMIN B12) 2000 MCG tablet Take 2,000 mcg by mouth daily.    Marland Kitchen levothyroxine (SYNTHROID, LEVOTHROID) 100 MCG tablet TAKE 1 TABLET BY MOUTH EVERY DAY 90 tablet 2  . olmesartan-hydrochlorothiazide (BENICAR HCT) 20-12.5 MG tablet Take 1 tablet by mouth daily. 30 tablet 11  . Polyethylene Glycol 3350 (MIRALAX PO) Take 17 g by mouth daily.     . traMADol (ULTRAM) 50 MG tablet Take 1 tablet (50 mg total) by mouth every 8 (eight) hours as needed for moderate pain or severe pain. Caution of sedation and constipation 30 tablet 0   No current facility-administered medications on file prior to visit.     Observations/Objective: The patient sounds like her usual self on the phone  Answers questions appropriately  Mildly anxious (which is her baseline) but pleasant  Not hoarse Not sob with speech  No cough during  interview Does not sound in pain or distress     Assessment and Plan: Problem List Items Addressed This Visit      Other   Dysuria    With frequency of urination for the past few weeks  UA is fairly clear except for small blood and high SG (over 1.030) Disc hydration status-pt drinks coffee all day and 2 glasses of water if any  Disc the impact this can have on bladder and urethral comfort Adv she inc fluids to 64 oz if possible -mostly water  For every cup of coffee I enc an equal or greater serving of water -this will also dilute the caffeine/acid  inst to watch for bladder pain/flank pain /fever or nausea  Urine was sent for a culture  Update if not starting to improve in a week or if worsening   Her son's caregiver was also listening in and plans to help remind her to drink water as well        Other Visit Diagnoses    Urinary urgency    -  Primary   Relevant Orders   POCT Urinalysis Dipstick (Automated) (Completed)   Urine Culture       Follow Up Instructions: Greatly increase your water intake Goal of 64 oz of fluid daily -mostly water Coffee can be a bladder irritant- please cut back  Also drink a glass of water for every cup of coffee  We will culture your urine and update you  At this time I do not think you have an infection  Update if not starting to improve in a week or if worsening      I discussed the assessment and treatment plan with the patient. The patient was provided an opportunity to ask questions and all were answered. The patient agreed with the plan and demonstrated an understanding of the instructions.   The patient was advised to call back or seek an in-person evaluation if the  symptoms worsen or if the condition fails to improve as anticipated.  I spent 13 minutes on this phone call today    Loura Pardon, MD

## 2018-05-30 NOTE — Assessment & Plan Note (Signed)
With frequency of urination for the past few weeks  UA is fairly clear except for small blood and high SG (over 1.030) Disc hydration status-pt drinks coffee all day and 2 glasses of water if any  Disc the impact this can have on bladder and urethral comfort Adv she inc fluids to 64 oz if possible -mostly water  For every cup of coffee I enc an equal or greater serving of water -this will also dilute the caffeine/acid  inst to watch for bladder pain/flank pain /fever or nausea  Urine was sent for a culture  Update if not starting to improve in a week or if worsening   Her son's caregiver was also listening in and plans to help remind her to drink water as well

## 2018-05-31 LAB — URINE CULTURE
MICRO NUMBER:: 413860
SPECIMEN QUALITY:: ADEQUATE

## 2018-07-19 DIAGNOSIS — G894 Chronic pain syndrome: Secondary | ICD-10-CM | POA: Diagnosis not present

## 2018-07-19 DIAGNOSIS — M48061 Spinal stenosis, lumbar region without neurogenic claudication: Secondary | ICD-10-CM | POA: Diagnosis not present

## 2018-07-19 DIAGNOSIS — M47816 Spondylosis without myelopathy or radiculopathy, lumbar region: Secondary | ICD-10-CM | POA: Diagnosis not present

## 2018-07-19 DIAGNOSIS — M4722 Other spondylosis with radiculopathy, cervical region: Secondary | ICD-10-CM | POA: Diagnosis not present

## 2018-08-14 DIAGNOSIS — G894 Chronic pain syndrome: Secondary | ICD-10-CM | POA: Diagnosis not present

## 2018-08-14 DIAGNOSIS — M48061 Spinal stenosis, lumbar region without neurogenic claudication: Secondary | ICD-10-CM | POA: Diagnosis not present

## 2018-08-14 DIAGNOSIS — M4722 Other spondylosis with radiculopathy, cervical region: Secondary | ICD-10-CM | POA: Diagnosis not present

## 2018-08-14 DIAGNOSIS — M47816 Spondylosis without myelopathy or radiculopathy, lumbar region: Secondary | ICD-10-CM | POA: Diagnosis not present

## 2018-09-25 DIAGNOSIS — M47816 Spondylosis without myelopathy or radiculopathy, lumbar region: Secondary | ICD-10-CM | POA: Diagnosis not present

## 2018-09-25 DIAGNOSIS — M48062 Spinal stenosis, lumbar region with neurogenic claudication: Secondary | ICD-10-CM | POA: Diagnosis not present

## 2018-09-25 DIAGNOSIS — G894 Chronic pain syndrome: Secondary | ICD-10-CM | POA: Diagnosis not present

## 2018-09-25 DIAGNOSIS — M4722 Other spondylosis with radiculopathy, cervical region: Secondary | ICD-10-CM | POA: Diagnosis not present

## 2018-10-04 ENCOUNTER — Encounter: Payer: Self-pay | Admitting: Family Medicine

## 2018-10-04 ENCOUNTER — Ambulatory Visit (INDEPENDENT_AMBULATORY_CARE_PROVIDER_SITE_OTHER): Payer: Medicare HMO | Admitting: Family Medicine

## 2018-10-04 ENCOUNTER — Other Ambulatory Visit: Payer: Self-pay

## 2018-10-04 VITALS — BP 162/86 | HR 81 | Temp 99.0°F | Ht 61.5 in | Wt 149.6 lb

## 2018-10-04 DIAGNOSIS — F418 Other specified anxiety disorders: Secondary | ICD-10-CM

## 2018-10-04 DIAGNOSIS — R69 Illness, unspecified: Secondary | ICD-10-CM | POA: Diagnosis not present

## 2018-10-04 DIAGNOSIS — R7303 Prediabetes: Secondary | ICD-10-CM

## 2018-10-04 DIAGNOSIS — I10 Essential (primary) hypertension: Secondary | ICD-10-CM | POA: Diagnosis not present

## 2018-10-04 DIAGNOSIS — G8929 Other chronic pain: Secondary | ICD-10-CM

## 2018-10-04 DIAGNOSIS — E039 Hypothyroidism, unspecified: Secondary | ICD-10-CM | POA: Diagnosis not present

## 2018-10-04 LAB — COMPREHENSIVE METABOLIC PANEL
ALT: 14 U/L (ref 0–35)
AST: 16 U/L (ref 0–37)
Albumin: 4.3 g/dL (ref 3.5–5.2)
Alkaline Phosphatase: 60 U/L (ref 39–117)
BUN: 22 mg/dL (ref 6–23)
CO2: 29 mEq/L (ref 19–32)
Calcium: 9.3 mg/dL (ref 8.4–10.5)
Chloride: 102 mEq/L (ref 96–112)
Creatinine, Ser: 1.07 mg/dL (ref 0.40–1.20)
GFR: 48.02 mL/min — ABNORMAL LOW (ref >60.00)
Glucose, Bld: 98 mg/dL (ref 70–99)
Potassium: 4.6 mEq/L (ref 3.5–5.1)
Sodium: 137 mEq/L (ref 135–145)
Total Bilirubin: 0.4 mg/dL (ref 0.2–1.2)
Total Protein: 7.1 g/dL (ref 6.0–8.3)

## 2018-10-04 LAB — CBC WITH DIFFERENTIAL/PLATELET
Basophils Absolute: 0 10*3/uL (ref 0.0–0.1)
Basophils Relative: 0.6 % (ref 0.0–3.0)
Eosinophils Absolute: 0.2 10*3/uL (ref 0.0–0.7)
Eosinophils Relative: 3.8 % (ref 0.0–5.0)
HCT: 33.2 % — ABNORMAL LOW (ref 36.0–46.0)
Hemoglobin: 10.9 g/dL — ABNORMAL LOW (ref 12.0–15.0)
Lymphocytes Relative: 27.9 % (ref 12.0–46.0)
Lymphs Abs: 1.6 10*3/uL (ref 0.7–4.0)
MCHC: 32.9 g/dL (ref 30.0–36.0)
MCV: 87.2 fl (ref 78.0–100.0)
Monocytes Absolute: 0.5 10*3/uL (ref 0.1–1.0)
Monocytes Relative: 7.8 % (ref 3.0–12.0)
Neutro Abs: 3.5 10*3/uL (ref 1.4–7.7)
Neutrophils Relative %: 59.9 % (ref 43.0–77.0)
Platelets: 182 10*3/uL (ref 150.0–400.0)
RBC: 3.8 Mil/uL — ABNORMAL LOW (ref 3.87–5.11)
RDW: 14.2 % (ref 11.5–15.5)
WBC: 5.9 10*3/uL (ref 4.0–10.5)

## 2018-10-04 LAB — HEMOGLOBIN A1C: Hgb A1c MFr Bld: 6.2 % (ref 4.6–6.5)

## 2018-10-04 LAB — LIPID PANEL
Cholesterol: 267 mg/dL — ABNORMAL HIGH (ref 0–200)
HDL: 74.7 mg/dL (ref >39.00)
LDL Cholesterol: 171 mg/dL — ABNORMAL HIGH (ref 0–99)
NonHDL: 191.8
Total CHOL/HDL Ratio: 4
Triglycerides: 103 mg/dL (ref 0.0–149.0)
VLDL: 20.6 mg/dL (ref 0.0–40.0)

## 2018-10-04 LAB — TSH: TSH: 10.48 u[IU]/mL — ABNORMAL HIGH (ref 0.35–4.50)

## 2018-10-04 MED ORDER — BUSPIRONE HCL 30 MG PO TABS: 30.0000 mg | ORAL_TABLET | Freq: Two times a day (BID) | ORAL | 3 refills | Status: DC

## 2018-10-04 MED ORDER — OLMESARTAN MEDOXOMIL-HCTZ 40-25 MG PO TABS: 1.0000 | ORAL_TABLET | Freq: Every day | ORAL | 3 refills | Status: DC

## 2018-10-04 NOTE — Progress Notes (Signed)
Subjective:    Patient ID: Dawn Aguirre, female    DOB: 1925/09/09, 85 y.o.   MRN: [id number]  HPI Here for HTN   Daughter Romie Minus retired and is here today- very helpful  Very busy  Shanon Brow is in hospice at home now - this is helping  It has been very rough  Good team is coming in - 50 hours per week   Very stressed  Has a Education officer, museum with hospice  Would be open to counseling  She is doing some exercises    Wt Readings from Last 3 Encounters:  10/04/18 149 lb 9 oz (67.8 kg)  10/06/17 134 lb 4 oz (60.9 kg)  06/05/17 133 lb 8 oz (60.6 kg)   27.80 kg/m   Diet  Fluids   Seeing Dr Hardin Negus for pain management  For her back and other areas  Starting with hydrocodone  May have other things   More cramping in legs    HTN  bp was elevated at pain management recentl 178/80 BP Readings from Last 3 Encounters:  10/04/18 (!) 162/86  10/06/17 131/68  09/29/17 (!) 195/56   Takes benicar hct 20-12.5 mg Misses doses occasionally - family has had to help with medication  This is day 10 with no missed doses    Lab Results  Component Value Date   CREATININE 1.18 10/06/2017   BUN 22 10/06/2017   NA 140 10/06/2017   K 4.0 10/06/2017   CL 104 10/06/2017   CO2 28 10/06/2017   Hypothyroid Lab Results  Component Value Date   TSH 4.38 12/05/2017   needs refill of levothyroxine 100 mcg    Patient Active Problem List   Diagnosis Date Noted  . Dysuria 05/30/2018  . Hypertension, essential 10/06/2017  . Right hand pain 06/05/2017  . Abrasion of left ring finger 06/05/2017  . Injury caused by dog bite, initial encounter 05/19/2017  . Tricuspid regurgitation 12/09/2016  . Mitral regurgitation 12/09/2016  . Normochromic anemia 12/09/2016  . Chronic abdominal pain 10/18/2016  . Urine frequency 09/09/2016  . Malaise and fatigue 09/09/2016  . Sleep disorder 01/13/2016  . Female cystocele 12/30/2015  . Left shoulder pain 09/02/2015  . Neck pain 09/02/2015  . Routine  general medical examination at a health care facility 07/22/2015  . Hearing loss 07/22/2015  . Prediabetes 07/22/2015  . Anxiety disorder 04/24/2015  . Tremor 12/20/2014  . Chronic pain 10/14/2014  . B12 deficiency 07/16/2014  . Vitamin D deficiency 07/16/2014  . Fatigue 07/04/2014  . Encounter for Medicare annual wellness exam 01/13/2014  . Fall in home 12/24/2013  . History of breast cancer 12/18/2013  . Epiploic appendagitis 02/22/2013  . Dry mouth 02/04/2013  . Benign paroxysmal positional vertigo 09/14/2012  . Low back pain 09/07/2012  . Constipation 11/04/2011  . Aortic stenosis 08/31/2010  . Hyperlipidemia   . STRESS REACTION, ACUTE, WITH EMOTIONAL DISTURBANCE 10/12/2007  . [place] DISEASE, CERVICAL 10/12/2007  . Hypothyroidism 01/22/2007  . RAYNAUD'S SYNDROME 01/22/2007  . EXTERNAL HEMORRHOIDS 01/22/2007  . DIVERTICULOSIS, COLON 01/22/2007  . FIBROCYSTIC BREAST DISEASE 01/22/2007  . Osteoporosis 01/22/2007  . GERD 12/16/2006  . DYSPNEA ON EXERTION 12/16/2006  . TOBACCO ABUSE, HX OF 12/16/2006  . HYSTERECTOMY, HX OF 12/16/2006   Past Medical History:  Diagnosis Date  . Anxiety   . Aortic stenosis    a. mild-mod by echo 2012.  . Cancer of left breast ([place])    surgery at [facility]  . Chronic airway obstruction, not  elsewhere classified   . Colitis, ischemic ([place]) 07/2000   ? infect  . Degeneration of cervical intervertebral disc   . Diffuse cystic mastopathy   . Diverticulosis of colon (without mention of hemorrhage)   . Esophageal reflux   . Essential hypertension   . External hemorrhoids without mention of complication   . Fibula fracture 2006  . Hyperlipidemia    a. intol of multiple statins.  . Lichen sclerosus    back/abd/vulvar  . Mastoiditis of right side 04/2005   admitted  . Mild mitral regurgitation 2012  . Moderate tricuspid regurgitation 2012  . Osteoporosis, unspecified   . Personal history of tobacco use, presenting hazards to health   . RA  (rheumatoid arthritis) ([facility])    Sero-negative  . Raynaud's syndrome   . Spinal stenosis   . Stress at home    "caring for son who is disabled" (12/08/2016)  . Unspecified gastritis and gastroduodenitis without mention of hemorrhage   . Unspecified hypothyroidism    Past Surgical History:  Procedure Laterality Date  . ABDOMINAL HYSTERECTOMY  1968  . BREAST BIOPSY Left   . BREAST LUMPECTOMY Left   . Cardiollite  02/2002   Neg  . CATARACT EXTRACTION W/ INTRAOCULAR LENS IMPLANT Right   . COLONOSCOPY    . COLONOSCOPY    . DEXA  2001   OP,heel  . DEXA     OP hip T-2.94  . NASAL SINUS SURGERY  10/2002   [facility] Endo 06/23/2010  . Nuclear Stress Test  11/07   Normal  . SHOULDER ARTHROSCOPY W/ ROTATOR CUFF REPAIR Right 06/2005   [facility] Endo 06/23/2010  . TONSILLECTOMY    . US ECHOCARDIOGRAPHY  10/06   normal LVF EF 55-65%   Social History   Tobacco Use  . Smoking status: Former Smoker    Packs/day: 2.00    Years: 10.00    Pack years: 20.00    Types: Cigarettes    Quit date: 02/07/1962    Years since quitting: 56.6  . Smokeless tobacco: Never Used  Substance Use Topics  . Alcohol use: Yes    Alcohol/week: 0.0 standard drinks    Comment: 12/08/2016 "glass of wine a couple times/month"  . Drug use: No   Family History  Problem Relation Age of Onset  . Heart attack Father 55  . Coronary artery disease Sister 33       CABG  . Heart attack Brother 58  . Coronary artery disease Unknown        GF   Allergies  Allergen Reactions  . Ace Inhibitors     REACTION: cough  . Dm-Apap-Cpm Nausea Only  . Ezetimibe     REACTION: myalgia  . Ezetimibe-Simvastatin     REACTION: joints swell  . Macrobid [Nitrofurantoin]     ABD PAIN, ALSO BODY ACHES/PAIN  . Moxifloxacin   . Rosuvastatin     REACTION: myalgia  . Septra [Sulfamethoxazole-Trimethoprim]     Sick feeling   . Simvastatin     REACTION: myalgia  . Triaminic    Current Outpatient Medications on File Prior to Visit  Medication  Sig Dispense Refill  . cyanocobalamin ([facility] VITAMIN B12) 2000 MCG tablet Take 2,000 mcg by mouth daily.    Marland Kitchen HYDROcodone-acetaminophen (NORCO/VICODIN) 5-325 MG tablet Take 1 tablet by mouth at bedtime.    Marland Kitchen levothyroxine (SYNTHROID, LEVOTHROID) 100 MCG tablet TAKE 1 TABLET BY MOUTH EVERY DAY 90 tablet 2  . Polyethylene Glycol 3350 (MIRALAX PO) Take 17 g by  mouth daily.      No current facility-administered medications on file prior to visit.       Review of Systems  Constitutional: Positive for fatigue. Negative for activity change, appetite change, fever and unexpected weight change.  HENT: Negative for congestion, ear pain, rhinorrhea, sinus pressure and sore throat.   Eyes: Negative for pain, redness and visual disturbance.  Respiratory: Negative for cough, shortness of breath and wheezing.   Cardiovascular: Negative for chest pain and palpitations.  Gastrointestinal: Negative for abdominal pain, blood in stool, constipation and diarrhea.  Endocrine: Negative for polydipsia and polyuria.  Genitourinary: Negative for dysuria, frequency and urgency.  Musculoskeletal: Positive for arthralgias and back pain. Negative for myalgias.  Skin: Negative for pallor and rash.  Allergic/Immunologic: Negative for environmental allergies.  Neurological: Negative for dizziness, syncope, facial asymmetry, light-headedness and headaches.  Hematological: Negative for adenopathy. Does not bruise/bleed easily.  Psychiatric/Behavioral: Positive for sleep disturbance. Negative for decreased concentration, dysphoric mood and suicidal ideas. The patient is nervous/anxious.        Objective:   Physical Exam Constitutional:      General: She is not in acute distress.    Appearance: Normal appearance. She is well-developed and normal weight. She is not ill-appearing or diaphoretic.  HENT:     Head: Normocephalic and atraumatic.     Nose: Nose normal.     Mouth/Throat:     Mouth: Mucous membranes are moist.      Pharynx: Oropharynx is clear.  Eyes:     Conjunctiva/sclera: Conjunctivae normal.     Pupils: Pupils are equal, round, and reactive to light.  Neck:     Musculoskeletal: Normal range of motion and neck supple. No neck rigidity or muscular tenderness.     Thyroid: No thyromegaly.     Vascular: No carotid bruit or JVD.  Cardiovascular:     Rate and Rhythm: Normal rate and regular rhythm.     Pulses: Normal pulses.     Heart sounds: Murmur present. No gallop.   Pulmonary:     Effort: Pulmonary effort is normal. No respiratory distress.     Breath sounds: Normal breath sounds. No wheezing or rales.     Comments: Good air exch Abdominal:     General: Bowel sounds are normal. There is no distension or abdominal bruit.     Palpations: Abdomen is soft. There is no mass.     Tenderness: There is no abdominal tenderness.     Hernia: No hernia is present.  Musculoskeletal:     Right lower leg: No edema.     Left lower leg: No edema.  Lymphadenopathy:     Cervical: No cervical adenopathy.  Skin:    General: Skin is warm and dry.     Findings: No rash.  Neurological:     Mental Status: She is alert. Mental status is at baseline.     Sensory: No sensory deficit.     Coordination: Coordination normal.     Deep Tendon Reflexes: Reflexes are normal and symmetric. Reflexes normal.     Comments: tremor  Psychiatric:        Mood and Affect: Mood is anxious.        Speech: Speech normal.        Cognition and Memory: Cognition normal.           Assessment & Plan:   Problem List Items Addressed This Visit      Cardiovascular and Mediastinum   Hypertension, essential -  Primary    bp not well controlled BP Readings from Last 1 Encounters:  10/04/18 (!) 162/86   Will increase benicar hct to 40-25 once daily  Family is helping her with medications now Most recent labs reviewed  Disc lifstyle change with low sodium diet and exercise  F/u 2 wk Lab today       Relevant  Medications   olmesartan-hydrochlorothiazide (BENICAR HCT) 40-25 MG tablet   Other Relevant Orders   Comprehensive metabolic panel (Completed)   CBC with Differential/Platelet (Completed)   TSH (Completed)   Lipid panel (Completed)     Endocrine   Hypothyroidism    TSH today  Some fatigue  No missed doses per family      Relevant Orders   TSH (Completed)     Other   Chronic pain    Going to pain clinic for back pain Taking hydrocodone      Relevant Medications   HYDROcodone-acetaminophen (NORCO/VICODIN) 5-325 MG tablet   Anxiety disorder    Worse lately with stressors (incl her son in hospice at home) Urged to re start buspar which helped Refill done F/u 2 wk      Relevant Medications   busPIRone (BUSPAR) 30 MG tablet   Prediabetes    A1C today  disc imp of low glycemic diet and wt loss to prevent DM2       Relevant Orders   Hemoglobin A1c (Completed)

## 2018-10-04 NOTE — Assessment & Plan Note (Signed)
TSH today  Some fatigue  No missed doses per family

## 2018-10-04 NOTE — Patient Instructions (Addendum)
Work on fluids - aim for at least 64 oz of fluids per day (mostly water) - a little coffee if  Decaf would be much better  Even the smallest amount of caffeine at your age will cause problems sleeping   For blood pressure I increased olmesartan hct to 40-25 mg once daily in am   Start back on buspar 30 mg twice daily for anxiety and stress reaction   Follow up in 2 weeks

## 2018-10-04 NOTE — Assessment & Plan Note (Signed)
Going to pain clinic for back pain Taking hydrocodone

## 2018-10-04 NOTE — Assessment & Plan Note (Signed)
A1C today  disc imp of low glycemic diet and wt loss to prevent DM2  

## 2018-10-04 NOTE — Assessment & Plan Note (Signed)
Worse lately with stressors (incl her son in hospice at home) Urged to re start buspar which helped Refill done F/u 2 wk

## 2018-10-04 NOTE — Assessment & Plan Note (Signed)
bp not well controlled BP Readings from Last 1 Encounters:  10/04/18 (!) 162/86   Will increase benicar hct to 40-25 once daily  Family is helping her with medications now Most recent labs reviewed  Disc lifstyle change with low sodium diet and exercise  F/u 2 wk Lab today

## 2018-10-18 ENCOUNTER — Ambulatory Visit (INDEPENDENT_AMBULATORY_CARE_PROVIDER_SITE_OTHER): Payer: Medicare HMO | Admitting: Family Medicine

## 2018-10-18 ENCOUNTER — Encounter: Payer: Self-pay | Admitting: Family Medicine

## 2018-10-18 ENCOUNTER — Other Ambulatory Visit: Payer: Self-pay

## 2018-10-18 VITALS — BP 120/58 | HR 70 | Temp 97.5°F | Ht 61.5 in | Wt 147.4 lb

## 2018-10-18 DIAGNOSIS — G8929 Other chronic pain: Secondary | ICD-10-CM | POA: Diagnosis not present

## 2018-10-18 DIAGNOSIS — E7800 Pure hypercholesterolemia, unspecified: Secondary | ICD-10-CM

## 2018-10-18 DIAGNOSIS — I10 Essential (primary) hypertension: Secondary | ICD-10-CM | POA: Diagnosis not present

## 2018-10-18 DIAGNOSIS — E538 Deficiency of other specified B group vitamins: Secondary | ICD-10-CM | POA: Diagnosis not present

## 2018-10-18 DIAGNOSIS — E039 Hypothyroidism, unspecified: Secondary | ICD-10-CM

## 2018-10-18 DIAGNOSIS — E559 Vitamin D deficiency, unspecified: Secondary | ICD-10-CM

## 2018-10-18 DIAGNOSIS — R69 Illness, unspecified: Secondary | ICD-10-CM | POA: Diagnosis not present

## 2018-10-18 DIAGNOSIS — M810 Age-related osteoporosis without current pathological fracture: Secondary | ICD-10-CM

## 2018-10-18 DIAGNOSIS — R7303 Prediabetes: Secondary | ICD-10-CM | POA: Diagnosis not present

## 2018-10-18 DIAGNOSIS — F418 Other specified anxiety disorders: Secondary | ICD-10-CM

## 2018-10-18 NOTE — Assessment & Plan Note (Signed)
Lab Results  Component Value Date   [id number] 12/08/2016   Unsure if pt is taking her oral B12  Will plan to re check this at next labs

## 2018-10-18 NOTE — Patient Instructions (Addendum)
Have your alexa remind you to drink some water every 2 hours or so  Also to take medication   For cholesterol  Avoid red meat/ fried foods/ egg yolks/ fatty breakfast meats/ butter, cheese and high fat dairy/ and shellfish    Let's re check labs in a month-thyroid/chemistries   Take 4000 iu vitamin D daily   Have family set up your pill box   Get your flu shot before the end of October -? Lab day   Follow up in 3 months approx

## 2018-10-18 NOTE — Progress Notes (Signed)
Subjective:    Patient ID: Dawn Aguirre, female    DOB: 1926-01-18, 85 y.o.   MRN: [id number]  HPI Here for f/u of chronic medical problems   Wt Readings from Last 3 Encounters:  10/18/18 147 lb 6 oz (66.8 kg)  10/04/18 149 lb 9 oz (67.8 kg)  10/06/17 134 lb 4 oz (60.9 kg)  inc in hctz -2 lb down  Trying to drink more water -family is helping with that  27.40 kg/m   Not drinking as much coffee   Wants to put off flu shot a bit longer    BP was up last time so we inc her benicar hct to the 40-25 mg dose once daily  Family was going to help more with medications That is her only bp medication  Does not check bp at home  No cp or palpitations  BP Readings from Last 3 Encounters:  10/18/18 (!) 152/60  10/04/18 (!) 162/86  10/06/17 131/68   Pulse Readings from Last 3 Encounters:  10/18/18 70  10/04/18 81  10/06/17 78   Lab Results  Component Value Date   CREATININE 1.07 10/04/2018   BUN 22 10/04/2018   NA 137 10/04/2018   K 4.6 10/04/2018   CL 102 10/04/2018   CO2 29 10/04/2018   Lab Results  Component Value Date   ALT 14 10/04/2018   AST 16 10/04/2018   ALKPHOS 60 10/04/2018   BILITOT 0.4 10/04/2018     TSH was checked Lab Results  Component Value Date   TSH 10.48 (H) 10/04/2018    Going to the pain clinic for chronic back pain On norco at bedtime  Not helping-she has appt with pain clinic on 9/29  Not sleeping due to pain on L side of back and body    Anxiety was worse with home stressors- re started buspar 30 mg bid  Enc to cut back her caffeine and increase water intake  She has been it Stress is very high -caring for husband and son  Daughter thinks it may have helped just a little  Thinks pain would make problems worse  No side effects    PHQ 2:1  Has a sad day occ regarding family circumstances  Still reads the paper and does computer work -enjoys that    Prediabetes Lab Results  Component Value Date   HGBA1C 6.2 10/04/2018   she likes muffins /likes sweets  Is eating some protein bars (low sugar)   Hyperlipidemia Lab Results  Component Value Date   CHOL 267 (H) 10/04/2018   CHOL 264 (H) 12/08/2016   CHOL 282 (H) 07/21/2015   Lab Results  Component Value Date   HDL 74.70 10/04/2018   HDL 76 12/08/2016   HDL 71.80 07/21/2015   Lab Results  Component Value Date   LDLCALC 171 (H) 10/04/2018   LDLCALC 173 (H) 12/08/2016   LDLCALC 195 (H) 07/21/2015   Lab Results  Component Value Date   TRIG 103.0 10/04/2018   TRIG 74 12/08/2016   TRIG 75.0 07/21/2015   Lab Results  Component Value Date   CHOLHDL 4 10/04/2018   CHOLHDL 3.5 12/08/2016   CHOLHDL 4 07/21/2015   Lab Results  Component Value Date   LDLDIRECT 211.2 07/13/2010   LDLDIRECT 128.6 10/12/2007   LDLDIRECT 175.4 03/07/2007  intol to statin and zetia  Declines tx at her age  High cholesterol runs in the family   Patient Active Problem List   Diagnosis Date Noted  .  Hypertension, essential 10/06/2017  . Right hand pain 06/05/2017  . Abrasion of left ring finger 06/05/2017  . Injury caused by dog bite, initial encounter 05/19/2017  . Tricuspid regurgitation 12/09/2016  . Mitral regurgitation 12/09/2016  . Normochromic anemia 12/09/2016  . Chronic abdominal pain 10/18/2016  . Urine frequency 09/09/2016  . Malaise and fatigue 09/09/2016  . Sleep disorder 01/13/2016  . Female cystocele 12/30/2015  . Left shoulder pain 09/02/2015  . Neck pain 09/02/2015  . Routine general medical examination at a health care facility 07/22/2015  . Hearing loss 07/22/2015  . Prediabetes 07/22/2015  . Anxiety disorder 04/24/2015  . Tremor 12/20/2014  . Chronic pain 10/14/2014  . B12 deficiency 07/16/2014  . Vitamin D deficiency 07/16/2014  . Fatigue 07/04/2014  . Encounter for Medicare annual wellness exam 01/13/2014  . Fall in home 12/24/2013  . History of breast cancer 12/18/2013  . Epiploic appendagitis 02/22/2013  . Dry mouth 02/04/2013   . Benign paroxysmal positional vertigo 09/14/2012  . Low back pain 09/07/2012  . Constipation 11/04/2011  . Aortic stenosis 08/31/2010  . Hyperlipidemia   . STRESS REACTION, ACUTE, WITH EMOTIONAL DISTURBANCE 10/12/2007  . [place] DISEASE, CERVICAL 10/12/2007  . Hypothyroidism 01/22/2007  . RAYNAUD'S SYNDROME 01/22/2007  . EXTERNAL HEMORRHOIDS 01/22/2007  . DIVERTICULOSIS, COLON 01/22/2007  . FIBROCYSTIC BREAST DISEASE 01/22/2007  . Osteoporosis 01/22/2007  . GERD 12/16/2006  . DYSPNEA ON EXERTION 12/16/2006  . TOBACCO ABUSE, HX OF 12/16/2006  . HYSTERECTOMY, HX OF 12/16/2006   Past Medical History:  Diagnosis Date  . Anxiety   . Aortic stenosis    a. mild-mod by echo 2012.  . Cancer of left breast ([place])    surgery at [facility]  . Chronic airway obstruction, not elsewhere classified   . Colitis, ischemic ([place]) 07/2000   ? infect  . Degeneration of cervical intervertebral disc   . Diffuse cystic mastopathy   . Diverticulosis of colon (without mention of hemorrhage)   . Esophageal reflux   . Essential hypertension   . External hemorrhoids without mention of complication   . Fibula fracture 2006  . Hyperlipidemia    a. intol of multiple statins.  . Lichen sclerosus    back/abd/vulvar  . Mastoiditis of right side 04/2005   admitted  . Mild mitral regurgitation 2012  . Moderate tricuspid regurgitation 2012  . Osteoporosis, unspecified   . Personal history of tobacco use, presenting hazards to health   . RA (rheumatoid arthritis) ([facility])    Sero-negative  . Raynaud's syndrome   . Spinal stenosis   . Stress at home    "caring for son who is disabled" (12/08/2016)  . Unspecified gastritis and gastroduodenitis without mention of hemorrhage   . Unspecified hypothyroidism    Past Surgical History:  Procedure Laterality Date  . ABDOMINAL HYSTERECTOMY  1968  . BREAST BIOPSY Left   . BREAST LUMPECTOMY Left   . Cardiollite  02/2002   Neg  . CATARACT EXTRACTION W/ INTRAOCULAR LENS  IMPLANT Right   . COLONOSCOPY    . COLONOSCOPY    . DEXA  2001   OP,heel  . DEXA     OP hip T-2.94  . NASAL SINUS SURGERY  10/2002   Archie Endo 06/23/2010  . Nuclear Stress Test  11/07   Normal  . SHOULDER ARTHROSCOPY W/ ROTATOR CUFF REPAIR Right 06/2005   Archie Endo 06/23/2010  . TONSILLECTOMY    . US ECHOCARDIOGRAPHY  10/06   normal LVF EF 55-65%   Social History  Tobacco Use  . Smoking status: Former Smoker    Packs/day: 2.00    Years: 10.00    Pack years: 20.00    Types: Cigarettes    Quit date: 02/07/1962    Years since quitting: 56.7  . Smokeless tobacco: Never Used  Substance Use Topics  . Alcohol use: Yes    Alcohol/week: 0.0 standard drinks    Comment: 12/08/2016 "glass of wine a couple times/month"  . Drug use: No   Family History  Problem Relation Age of Onset  . Heart attack Father 9  . Coronary artery disease Sister 77       CABG  . Heart attack Brother 82  . Coronary artery disease Unknown        GF   Allergies  Allergen Reactions  . Ace Inhibitors     REACTION: cough  . Dm-Apap-Cpm Nausea Only  . Ezetimibe     REACTION: myalgia  . Ezetimibe-Simvastatin     REACTION: joints swell  . Macrobid [Nitrofurantoin]     ABD PAIN, ALSO BODY ACHES/PAIN  . Moxifloxacin   . Rosuvastatin     REACTION: myalgia  . Septra [Sulfamethoxazole-Trimethoprim]     Sick feeling   . Simvastatin     REACTION: myalgia  . Triaminic    Current Outpatient Medications on File Prior to Visit  Medication Sig Dispense Refill  . busPIRone (BUSPAR) 30 MG tablet Take 1 tablet (30 mg total) by mouth 2 (two) times daily. 180 tablet 3  . cholecalciferol (VITAMIN D3) 25 MCG (1000 UT) tablet Take 4,000 Units by mouth daily.    . cyanocobalamin ([facility] VITAMIN B12) 2000 MCG tablet Take 2,000 mcg by mouth daily.    Marland Kitchen HYDROcodone-acetaminophen (NORCO/VICODIN) 5-325 MG tablet Take 1 tablet by mouth at bedtime.    Marland Kitchen levothyroxine (SYNTHROID, LEVOTHROID) 100 MCG tablet TAKE 1 TABLET BY MOUTH  EVERY DAY 90 tablet 2  . olmesartan-hydrochlorothiazide (BENICAR HCT) 40-25 MG tablet Take 1 tablet by mouth daily. 90 tablet 3  . Polyethylene Glycol 3350 (MIRALAX PO) Take 17 g by mouth daily.      No current facility-administered medications on file prior to visit.     Review of Systems  Constitutional: Positive for fatigue. Negative for activity change, appetite change, fever and unexpected weight change.  HENT: Negative for congestion, ear pain, rhinorrhea, sinus pressure and sore throat.   Eyes: Negative for pain, redness and visual disturbance.  Respiratory: Negative for cough, shortness of breath and wheezing.        Poor exercise tolerance  Cardiovascular: Negative for chest pain and palpitations.  Gastrointestinal: Negative for abdominal pain, blood in stool, constipation and diarrhea.  Endocrine: Negative for polydipsia and polyuria.  Genitourinary: Negative for dysuria, frequency and urgency.  Musculoskeletal: Positive for arthralgias, back pain and gait problem. Negative for joint swelling and myalgias.  Skin: Negative for pallor and rash.  Allergic/Immunologic: Negative for environmental allergies.  Neurological: Negative for dizziness, syncope and headaches.  Hematological: Negative for adenopathy. Does not bruise/bleed easily.  Psychiatric/Behavioral: Positive for sleep disturbance. Negative for decreased concentration and dysphoric mood. The patient is nervous/anxious.        Objective:   Physical Exam Constitutional:      General: She is not in acute distress.    Appearance: Normal appearance. She is well-developed and normal weight. She is not ill-appearing.  HENT:     Head: Normocephalic and atraumatic.     Mouth/Throat:     Mouth: Mucous membranes are moist.  Eyes:     General: No scleral icterus.    Conjunctiva/sclera: Conjunctivae normal.     Pupils: Pupils are equal, round, and reactive to light.  Neck:     Musculoskeletal: Normal range of motion and  neck supple. No muscular tenderness.     Thyroid: No thyromegaly.     Vascular: No carotid bruit or JVD.  Cardiovascular:     Rate and Rhythm: Normal rate and regular rhythm.     Pulses: Normal pulses.     Heart sounds: Normal heart sounds. No gallop.   Pulmonary:     Effort: Pulmonary effort is normal. No respiratory distress.     Breath sounds: Normal breath sounds. No wheezing or rales.  Abdominal:     General: Bowel sounds are normal. There is no distension or abdominal bruit.     Palpations: Abdomen is soft. There is no mass.     Tenderness: There is no abdominal tenderness.  Musculoskeletal:     Right lower leg: No edema.     Left lower leg: No edema.  Lymphadenopathy:     Cervical: No cervical adenopathy.  Skin:    General: Skin is warm and dry.     Findings: No rash.  Neurological:     Mental Status: She is alert.     Cranial Nerves: No cranial nerve deficit.     Sensory: No sensory deficit.     Coordination: Coordination normal.     Deep Tendon Reflexes: Reflexes are normal and symmetric.     Comments: Mild hand tremor   Psychiatric:        Mood and Affect: Mood is anxious.     Comments: Suspect some short term memory loss  Cognition intact  Daughter is helpful with hx            Assessment & Plan:   Problem List Items Addressed This Visit      Cardiovascular and Mediastinum   Hypertension, essential - Primary    bp in fair control at this time  BP Readings from Last 1 Encounters:  10/18/18 (!) 120/58  much improved after sitting  No changes needed Most recent labs reviewed , will repeat chemistries in a month  Disc lifstyle change with low sodium diet and exercise        Relevant Orders   Basic metabolic panel     Endocrine   Hypothyroidism    Lab Results  Component Value Date   TSH 10.48 (H) 10/04/2018   She was not compliant with levothyroxine  Now family is helping-not missing doses Rev way to take (away from food or supplements)  Re  check TSH in 1 mo      Relevant Orders   TSH     Musculoskeletal and Integument   Osteoporosis    Not taking vit D as ordered Will begin 4000 iu daily in evening  Cannot take ca due to constipation  Daughter will help with her medicines       Relevant Medications   cholecalciferol (VITAMIN D3) 25 MCG (1000 UT) tablet     Other   Hyperlipidemia    LDL in 170s and good HDL Disc goals for lipids and reasons to control them Rev last labs with pt Rev low sat fat diet in detail Intol to statin and zetia  Urged to watch diet       B12 deficiency    Lab Results  Component Value Date   VITAMINB12 362 12/08/2016   Unsure  if pt is taking her oral B12  Will plan to re check this at next labs       Relevant Orders   Vitamin B12   Vitamin D deficiency    Not currently taking D  inst to get 4000 iu of D3 daily in the evening  Daughter will help with med org       Relevant Orders   VITAMIN D 25 Hydroxy (Vit-D Deficiency, Fractures)   Chronic pain    Joint/back Continues pain clinic visits Currently takes norco at bedtime  Pain is not well controlled  She is also trying to do more than she should F/u end of the mo to pain clinic      Anxiety disorder    Pt is unsure if buspar is helping She plans to try it for a while longer Good family support Reviewed stressors/ coping techniques/symptoms/ support sources/ tx options and side effects in detail today       Prediabetes    Lab Results  Component Value Date   HGBA1C 6.2 10/04/2018   disc imp of low glycemic diet and wt loss to prevent DM2

## 2018-10-18 NOTE — Assessment & Plan Note (Signed)
Lab Results  Component Value Date   HGBA1C 6.2 10/04/2018   disc imp of low glycemic diet and wt loss to prevent DM2

## 2018-10-18 NOTE — Assessment & Plan Note (Signed)
Not currently taking D  inst to get 4000 iu of D3 daily in the evening  Daughter will help with med org

## 2018-10-18 NOTE — Assessment & Plan Note (Signed)
Lab Results  Component Value Date   TSH 10.48 (H) 10/04/2018   She was not compliant with levothyroxine  Now family is helping-not missing doses Rev way to take (away from food or supplements)  Re check TSH in 1 mo

## 2018-10-18 NOTE — Assessment & Plan Note (Signed)
Pt is unsure if buspar is helping She plans to try it for a while longer Good family support Reviewed stressors/ coping techniques/symptoms/ support sources/ tx options and side effects in detail today

## 2018-10-18 NOTE — Assessment & Plan Note (Signed)
Joint/back Continues pain clinic visits Currently takes norco at bedtime  Pain is not well controlled  She is also trying to do more than she should F/u end of the mo to pain clinic

## 2018-10-18 NOTE — Assessment & Plan Note (Signed)
bp in fair control at this time  BP Readings from Last 1 Encounters:  10/18/18 (!) 120/58  much improved after sitting  No changes needed Most recent labs reviewed , will repeat chemistries in a month  Disc lifstyle change with low sodium diet and exercise

## 2018-10-18 NOTE — Assessment & Plan Note (Signed)
LDL in 170s and good HDL Disc goals for lipids and reasons to control them Rev last labs with pt Rev low sat fat diet in detail Intol to statin and zetia  Urged to watch diet

## 2018-10-18 NOTE — Assessment & Plan Note (Signed)
Not taking vit D as ordered Will begin 4000 iu daily in evening  Cannot take ca due to constipation  Daughter will help with her medicines

## 2018-11-20 ENCOUNTER — Other Ambulatory Visit: Payer: Medicare HMO

## 2018-11-23 DIAGNOSIS — G894 Chronic pain syndrome: Secondary | ICD-10-CM | POA: Diagnosis not present

## 2018-11-23 DIAGNOSIS — M48061 Spinal stenosis, lumbar region without neurogenic claudication: Secondary | ICD-10-CM | POA: Diagnosis not present

## 2018-11-23 DIAGNOSIS — M4722 Other spondylosis with radiculopathy, cervical region: Secondary | ICD-10-CM | POA: Diagnosis not present

## 2018-11-23 DIAGNOSIS — M47816 Spondylosis without myelopathy or radiculopathy, lumbar region: Secondary | ICD-10-CM | POA: Diagnosis not present

## 2018-11-28 ENCOUNTER — Other Ambulatory Visit (INDEPENDENT_AMBULATORY_CARE_PROVIDER_SITE_OTHER): Payer: Medicare HMO

## 2018-11-28 DIAGNOSIS — E559 Vitamin D deficiency, unspecified: Secondary | ICD-10-CM

## 2018-11-28 DIAGNOSIS — Z23 Encounter for immunization: Secondary | ICD-10-CM | POA: Diagnosis not present

## 2018-11-28 DIAGNOSIS — E039 Hypothyroidism, unspecified: Secondary | ICD-10-CM | POA: Diagnosis not present

## 2018-11-28 DIAGNOSIS — I10 Essential (primary) hypertension: Secondary | ICD-10-CM

## 2018-11-28 DIAGNOSIS — E538 Deficiency of other specified B group vitamins: Secondary | ICD-10-CM | POA: Diagnosis not present

## 2018-11-28 LAB — BASIC METABOLIC PANEL
BUN: 19 mg/dL (ref 6–23)
CO2: 27 mEq/L (ref 19–32)
Calcium: 9.3 mg/dL (ref 8.4–10.5)
Chloride: 103 mEq/L (ref 96–112)
Creatinine, Ser: 1.21 mg/dL — ABNORMAL HIGH (ref 0.40–1.20)
GFR: 41.65 mL/min — ABNORMAL LOW (ref >60.00)
Glucose, Bld: 112 mg/dL — ABNORMAL HIGH (ref 70–99)
Potassium: 4.5 mEq/L (ref 3.5–5.1)
Sodium: 139 mEq/L (ref 135–145)

## 2018-11-28 LAB — VITAMIN B12: Vitamin B-12: 197 pg/mL — ABNORMAL LOW (ref 211–911)

## 2018-11-28 LAB — VITAMIN D 25 HYDROXY (VIT D DEFICIENCY, FRACTURES): VITD: 22.06 ng/mL — ABNORMAL LOW (ref 30.00–100.00)

## 2018-11-28 LAB — TSH: TSH: 9.22 u[IU]/mL — ABNORMAL HIGH (ref 0.35–4.50)

## 2018-12-08 ENCOUNTER — Other Ambulatory Visit: Payer: Self-pay | Admitting: Family Medicine

## 2018-12-26 ENCOUNTER — Telehealth: Payer: Self-pay

## 2018-12-26 NOTE — Telephone Encounter (Signed)
Pt left v/m that she had a heavy nose bleed earlier but is OK now. I tried called several times and phone rang but no answer and no v/m. I spoke with Romie Minus pts daughter and she said her brother had been on the phone and when she talked with her mom if I had not talked with her she would have pt call [facility], Romie Minus said during the night about 3 AM pt had a heavy nose bleed that soaked the covers.  Pt is OK now but concerned how heavy nose bleed was. I spoke with pt and she did not have any h/a,dizziness,cp or sob; pt said her son is on hospice and she has been concerned about him and pt thought her BP might be elevated but pt does not know where BP cuff is. Pt was offered an appt today but pt said she has no transportation today and cannot do virtual visit. Pt scheduled in office 30' appt with Dr Glori Bickers on 12/27/18 at 11 AM. ED precautions given and pt voiced understanding. Pt has no covid symptoms, no travel and no known exposure to + covid. FYI to Dr Glori Bickers.

## 2018-12-26 NOTE — Telephone Encounter (Signed)
I will see her then  If symptoms return or are severe in the meantime let us know

## 2018-12-27 ENCOUNTER — Ambulatory Visit (INDEPENDENT_AMBULATORY_CARE_PROVIDER_SITE_OTHER): Payer: Medicare HMO | Admitting: Family Medicine

## 2018-12-27 ENCOUNTER — Other Ambulatory Visit: Payer: Self-pay

## 2018-12-27 ENCOUNTER — Encounter: Payer: Self-pay | Admitting: Family Medicine

## 2018-12-27 VITALS — BP 136/72 | HR 70 | Temp 96.9°F | Ht 61.5 in | Wt 149.4 lb

## 2018-12-27 DIAGNOSIS — R040 Epistaxis: Secondary | ICD-10-CM

## 2018-12-27 DIAGNOSIS — I10 Essential (primary) hypertension: Secondary | ICD-10-CM

## 2018-12-27 NOTE — Progress Notes (Signed)
Subjective:    Patient ID: Dawn Aguirre, female    DOB: 1925-07-31, 85 y.o.   MRN: [id number]  HPI Pt presents with c/o recent nose bleeds   Woke up 2 nt ago in the middle of the night  Bled quite a bit onto bed -then into the bathroom / front of nightgown was bloody   Did not know which nostril- could not tell  May have taken 30 or less minutes to stop it  She did not pinch her nose - just held tissue  Did not swallow blood or get sick to her stomach  No clots   No aspirin  No nsaids at all     Last nosebleed was last year   Has turned the heat on in her house  Husband tends to turn the heat up too high   No nasal symptoms  No facial or nasal pain  No congestion      Was concerned about blood pressure  Taking olmesartan hct 40-25 mg  Lab Results  Component Value Date   CREATININE 1.21 (H) 11/28/2018   BUN 19 11/28/2018   NA 139 11/28/2018   K 4.5 11/28/2018   CL 103 11/28/2018   CO2 27 11/28/2018   bp is up on first check today No cp or palpitations or headaches or edema  No side effects to medicines  BP Readings from Last 3 Encounters:  12/27/18 (!) 164/90  10/18/18 (!) 120/58  10/04/18 (!) 162/86     bp was high after her nose bleed - then came down  Under a lot of stress with her family (husband and son )   She has probably missed her blood pressure medicine    Pulse Readings from Last 3 Encounters:  12/27/18 70  10/18/18 70  10/04/18 81   Does not take any asa or blood thinners  Lab Results  Component Value Date   WBC 5.9 10/04/2018   HGB 10.9 (L) 10/04/2018   HCT 33.2 (L) 10/04/2018   MCV 87.2 10/04/2018   PLT 182.0 10/04/2018  has a h/o normochromic anemia  Patient Active Problem List   Diagnosis Date Noted  . Epistaxis 12/27/2018  . Hypertension, essential 10/06/2017  . Right hand pain 06/05/2017  . Abrasion of left ring finger 06/05/2017  . Injury caused by dog bite, initial encounter 05/19/2017  . Tricuspid regurgitation  12/09/2016  . Mitral regurgitation 12/09/2016  . Normochromic anemia 12/09/2016  . Chronic abdominal pain 10/18/2016  . Urine frequency 09/09/2016  . Malaise and fatigue 09/09/2016  . Sleep disorder 01/13/2016  . Female cystocele 12/30/2015  . Left shoulder pain 09/02/2015  . Neck pain 09/02/2015  . Routine general medical examination at a health care facility 07/22/2015  . Hearing loss 07/22/2015  . Prediabetes 07/22/2015  . Anxiety disorder 04/24/2015  . Tremor 12/20/2014  . Chronic pain 10/14/2014  . B12 deficiency 07/16/2014  . Vitamin D deficiency 07/16/2014  . Fatigue 07/04/2014  . Encounter for Medicare annual wellness exam 01/13/2014  . Fall in home 12/24/2013  . History of breast cancer 12/18/2013  . Epiploic appendagitis 02/22/2013  . Dry mouth 02/04/2013  . Benign paroxysmal positional vertigo 09/14/2012  . Low back pain 09/07/2012  . Constipation 11/04/2011  . Aortic stenosis 08/31/2010  . Hyperlipidemia   . STRESS REACTION, ACUTE, WITH EMOTIONAL DISTURBANCE 10/12/2007  . [place] DISEASE, CERVICAL 10/12/2007  . Hypothyroidism 01/22/2007  . RAYNAUD'S SYNDROME 01/22/2007  . EXTERNAL HEMORRHOIDS 01/22/2007  . DIVERTICULOSIS, COLON 01/22/2007  .  FIBROCYSTIC BREAST DISEASE 01/22/2007  . Osteoporosis 01/22/2007  . GERD 12/16/2006  . DYSPNEA ON EXERTION 12/16/2006  . TOBACCO ABUSE, HX OF 12/16/2006  . HYSTERECTOMY, HX OF 12/16/2006   Past Medical History:  Diagnosis Date  . Anxiety   . Aortic stenosis    a. mild-mod by echo 2012.  . Cancer of left breast ([place])    surgery at [facility]  . Chronic airway obstruction, not elsewhere classified   . Colitis, ischemic ([place]) 07/2000   ? infect  . Degeneration of cervical intervertebral disc   . Diffuse cystic mastopathy   . Diverticulosis of colon (without mention of hemorrhage)   . Esophageal reflux   . Essential hypertension   . External hemorrhoids without mention of complication   . Fibula fracture 2006  .  Hyperlipidemia    a. intol of multiple statins.  . Lichen sclerosus    back/abd/vulvar  . Mastoiditis of right side 04/2005   admitted  . Mild mitral regurgitation 2012  . Moderate tricuspid regurgitation 2012  . Osteoporosis, unspecified   . Personal history of tobacco use, presenting hazards to health   . RA (rheumatoid arthritis) (HCC)    Sero-negative  . Raynaud's syndrome   . Spinal stenosis   . Stress at home    "caring for son who is disabled" (12/08/2016)  . Unspecified gastritis and gastroduodenitis without mention of hemorrhage   . Unspecified hypothyroidism    Past Surgical History:  Procedure Laterality Date  . ABDOMINAL HYSTERECTOMY  1968  . BREAST BIOPSY Left   . BREAST LUMPECTOMY Left   . Cardiollite  02/2002   Neg  . CATARACT EXTRACTION W/ INTRAOCULAR LENS IMPLANT Right   . COLONOSCOPY    . COLONOSCOPY    . DEXA  2001   OP,heel  . DEXA     OP hip T-2.94  . NASAL SINUS SURGERY  10/2002   Archie Endo 06/23/2010  . Nuclear Stress Test  11/07   Normal  . SHOULDER ARTHROSCOPY W/ ROTATOR CUFF REPAIR Right 06/2005   Archie Endo 06/23/2010  . TONSILLECTOMY    . US ECHOCARDIOGRAPHY  10/06   normal LVF EF 55-65%   Social History   Tobacco Use  . Smoking status: Former Smoker    Packs/day: 2.00    Years: 10.00    Pack years: 20.00    Types: Cigarettes    Quit date: 02/07/1962    Years since quitting: 56.9  . Smokeless tobacco: Never Used  Substance Use Topics  . Alcohol use: Yes    Alcohol/week: 0.0 standard drinks    Comment: 12/08/2016 "glass of wine a couple times/month"  . Drug use: No   Family History  Problem Relation Age of Onset  . Heart attack Father 54  . Coronary artery disease Sister 49       CABG  . Heart attack Brother 69  . Coronary artery disease Unknown        GF   Allergies  Allergen Reactions  . Ace Inhibitors     REACTION: cough  . Dm-Apap-Cpm Nausea Only  . Ezetimibe     REACTION: myalgia  . Ezetimibe-Simvastatin     REACTION:  joints swell  . Macrobid [Nitrofurantoin]     ABD PAIN, ALSO BODY ACHES/PAIN  . Moxifloxacin   . Rosuvastatin     REACTION: myalgia  . Septra [Sulfamethoxazole-Trimethoprim]     Sick feeling   . Simvastatin     REACTION: myalgia  . Triaminic    Current  Outpatient Medications on File Prior to Visit  Medication Sig Dispense Refill  . busPIRone (BUSPAR) 30 MG tablet Take 1 tablet (30 mg total) by mouth 2 (two) times daily. 180 tablet 3  . cholecalciferol (VITAMIN D3) 25 MCG (1000 UT) tablet Take 4,000 Units by mouth daily.    . cyanocobalamin ([facility] VITAMIN B12) 2000 MCG tablet Take 2,000 mcg by mouth daily.    Marland Kitchen HYDROcodone-acetaminophen (NORCO/VICODIN) 5-325 MG tablet Take 1 tablet by mouth at bedtime.    Marland Kitchen levothyroxine (SYNTHROID) 100 MCG tablet TAKE 1 TABLET BY MOUTH EVERY DAY 90 tablet 0  . olmesartan-hydrochlorothiazide (BENICAR HCT) 40-25 MG tablet Take 1 tablet by mouth daily. 90 tablet 3  . Polyethylene Glycol 3350 (MIRALAX PO) Take 17 g by mouth daily.      No current facility-administered medications on file prior to visit.     Review of Systems  Constitutional: Negative for activity change, appetite change, fatigue, fever and unexpected weight change.  HENT: Positive for nosebleeds. Negative for congestion, ear pain, rhinorrhea, sinus pressure and sore throat.   Eyes: Negative for pain, redness and visual disturbance.  Respiratory: Negative for cough, shortness of breath and wheezing.   Cardiovascular: Negative for chest pain and palpitations.  Gastrointestinal: Negative for abdominal pain, blood in stool, constipation and diarrhea.  Endocrine: Negative for polydipsia and polyuria.  Genitourinary: Negative for dysuria, frequency and urgency.  Musculoskeletal: Positive for arthralgias and back pain. Negative for myalgias.  Skin: Negative for pallor and rash.  Allergic/Immunologic: Negative for environmental allergies.  Neurological: Negative for dizziness, syncope, facial  asymmetry and headaches.  Hematological: Negative for adenopathy. Does not bruise/bleed easily.  Psychiatric/Behavioral: Negative for decreased concentration and dysphoric mood. The patient is not nervous/anxious.        Objective:   Physical Exam Constitutional:      General: She is not in acute distress.    Appearance: Normal appearance. She is normal weight. She is not ill-appearing.  HENT:     Head: Normocephalic and atraumatic.     Right Ear: Tympanic membrane and ear canal normal.     Left Ear: Tympanic membrane and ear canal normal.     Nose: No congestion or rhinorrhea.     Comments: Small scab in lateral L nostril Not actively bleeding     Mouth/Throat:     Mouth: Mucous membranes are moist.     Pharynx: Oropharynx is clear.  Eyes:     General: No scleral icterus.       Right eye: No discharge.        Left eye: No discharge.     Extraocular Movements: Extraocular movements intact.     Conjunctiva/sclera: Conjunctivae normal.     Pupils: Pupils are equal, round, and reactive to light.  Neck:     Musculoskeletal: Normal range of motion and neck supple.  Cardiovascular:     Rate and Rhythm: Normal rate and regular rhythm.     Heart sounds: Murmur present.  Pulmonary:     Effort: Pulmonary effort is normal. No respiratory distress.     Breath sounds: Normal breath sounds. No wheezing or rales.  Lymphadenopathy:     Cervical: No cervical adenopathy.  Skin:    General: Skin is warm and dry.     Coloration: Skin is not pale.     Findings: No erythema.  Neurological:     Mental Status: She is alert.  Psychiatric:        Mood and Affect: Mood normal.  Assessment & Plan:   Problem List Items Addressed This Visit      Cardiovascular and Mediastinum   Hypertension, essential    bp in fair control at this time  BP Readings from Last 1 Encounters:  12/27/18 136/72  (better on 2nd check) No changes needed Most recent labs reviewed  Disc lifstyle  change with low sodium diet and exercise          Other   Epistaxis - Primary    Resolved  Small scab in L lateral nostril is likely the cause bp is controlled -pt will continue to monitor at home  Suggest small amt of vaseline/aquaphor in nostrils at bed time and use of a vaporizer in the bedroom  Disc what to do in case of a nose bleed - pinch firmly 10 min minimum  (go to ER if cannot stop in 30 minutes)  Hand out given

## 2018-12-27 NOTE — Assessment & Plan Note (Signed)
Resolved  Small scab in L lateral nostril is likely the cause bp is controlled -pt will continue to monitor at home  Suggest small amt of vaseline/aquaphor in nostrils at bed time and use of a vaporizer in the bedroom  Disc what to do in case of a nose bleed - pinch firmly 10 min minimum  (go to ER if cannot stop in 30 minutes)  Hand out given

## 2018-12-27 NOTE — Patient Instructions (Addendum)
You have to take your medicines as directed every day   Set your cell phone alarm or reminder to go off at 10 am (in your kitchen)  Also set an alarm clock in the bedroom at 10 am  That ways   Have someone watch you take your pills !!!!!!!  You can also have Romie Minus check in with you   Your blood pressure is better on re check today  136/70   If you feel unsteady- use your walker at home   Use a vaporizer in the bedroom to prevent nose bleeds   Try to put a tiny bit of vaseline or aquaphor in both nostrils at bedtime to prevent cracking  If you get a nosebleed we want you to pinch your whole nose firmly for at least 10-15 minutes before letting go  If bleeding persists beyond 30 minutes-go to the hospital   You have a tiny scab in the L nostril that I think caused the nose bleed

## 2018-12-27 NOTE — Assessment & Plan Note (Signed)
bp in fair control at this time  BP Readings from Last 1 Encounters:  12/27/18 136/72  (better on 2nd check) No changes needed Most recent labs reviewed  Disc lifstyle change with low sodium diet and exercise

## 2019-01-10 DIAGNOSIS — M47816 Spondylosis without myelopathy or radiculopathy, lumbar region: Secondary | ICD-10-CM | POA: Diagnosis not present

## 2019-01-10 DIAGNOSIS — M4722 Other spondylosis with radiculopathy, cervical region: Secondary | ICD-10-CM | POA: Diagnosis not present

## 2019-01-10 DIAGNOSIS — M79672 Pain in left foot: Secondary | ICD-10-CM | POA: Diagnosis not present

## 2019-01-10 DIAGNOSIS — Z79891 Long term (current) use of opiate analgesic: Secondary | ICD-10-CM | POA: Diagnosis not present

## 2019-01-10 DIAGNOSIS — M48061 Spinal stenosis, lumbar region without neurogenic claudication: Secondary | ICD-10-CM | POA: Diagnosis not present

## 2019-01-22 ENCOUNTER — Other Ambulatory Visit: Payer: Self-pay

## 2019-01-22 ENCOUNTER — Encounter: Payer: Self-pay | Admitting: Family Medicine

## 2019-01-22 ENCOUNTER — Ambulatory Visit (INDEPENDENT_AMBULATORY_CARE_PROVIDER_SITE_OTHER)
Admission: RE | Admit: 2019-01-22 | Discharge: 2019-01-22 | Disposition: A | Discharge status: Home / Self Care | Payer: Medicare HMO | Source: Ambulatory Visit | Attending: Family Medicine | Admitting: Family Medicine

## 2019-01-22 ENCOUNTER — Ambulatory Visit (INDEPENDENT_AMBULATORY_CARE_PROVIDER_SITE_OTHER): Payer: Medicare HMO | Admitting: Family Medicine

## 2019-01-22 VITALS — BP 134/68 | HR 79 | Temp 97.2°F | Ht 61.5 in | Wt 150.3 lb

## 2019-01-22 DIAGNOSIS — E559 Vitamin D deficiency, unspecified: Secondary | ICD-10-CM

## 2019-01-22 DIAGNOSIS — I10 Essential (primary) hypertension: Secondary | ICD-10-CM | POA: Diagnosis not present

## 2019-01-22 DIAGNOSIS — E039 Hypothyroidism, unspecified: Secondary | ICD-10-CM

## 2019-01-22 DIAGNOSIS — M79672 Pain in left foot: Secondary | ICD-10-CM

## 2019-01-22 DIAGNOSIS — E538 Deficiency of other specified B group vitamins: Secondary | ICD-10-CM | POA: Diagnosis not present

## 2019-01-22 DIAGNOSIS — R600 Localized edema: Secondary | ICD-10-CM | POA: Diagnosis not present

## 2019-01-22 LAB — COMPREHENSIVE METABOLIC PANEL
ALT: 10 U/L (ref 0–35)
AST: 13 U/L (ref 0–37)
Albumin: 4 g/dL (ref 3.5–5.2)
Alkaline Phosphatase: 64 U/L (ref 39–117)
BUN: 20 mg/dL (ref 6–23)
CO2: 28 mEq/L (ref 19–32)
Calcium: 9.2 mg/dL (ref 8.4–10.5)
Chloride: 102 mEq/L (ref 96–112)
Creatinine, Ser: 1.08 mg/dL (ref 0.40–1.20)
GFR: 47.47 mL/min — ABNORMAL LOW (ref >60.00)
Glucose, Bld: 114 mg/dL — ABNORMAL HIGH (ref 70–99)
Potassium: 4.2 mEq/L (ref 3.5–5.1)
Sodium: 137 mEq/L (ref 135–145)
Total Bilirubin: 0.4 mg/dL (ref 0.2–1.2)
Total Protein: 7.1 g/dL (ref 6.0–8.3)

## 2019-01-22 LAB — VITAMIN D 25 HYDROXY (VIT D DEFICIENCY, FRACTURES): VITD: 21.74 ng/mL — ABNORMAL LOW (ref 30.00–100.00)

## 2019-01-22 LAB — TSH: TSH: 11.42 u[IU]/mL — ABNORMAL HIGH (ref 0.35–4.50)

## 2019-01-22 LAB — VITAMIN B12: Vitamin B-12: 130 pg/mL — ABNORMAL LOW (ref 211–911)

## 2019-01-22 NOTE — Progress Notes (Signed)
Subjective:    Patient ID: Dawn Aguirre, female    DOB: 1925-05-02, 85 y.o.   MRN: [id number]  HPI Here for f/u of chronic medical problems   Doing ok with pandemic- being very careful   Wt Readings from Last 3 Encounters:  01/22/19 150 lb 5 oz (68.2 kg)  12/27/18 149 lb 6 oz (67.8 kg)  10/18/18 147 lb 6 oz (66.8 kg)  stable  27.94 kg/m    Also having a L foot problem  Keeping her awake at night  Months  Thinks it started with stubbed great toe Pain shoots up her L medial leg  She goes to the pain clinic for other things (said if not better needs xray)  Swollen medially above her R great toe  Goes to podiatry for her nail care    bp is stable today  No cp or palpitations or headaches or edema  No side effects to medicines  BP Readings from Last 3 Encounters:  01/22/19 134/68  12/27/18 136/72  10/18/18 (!) 120/58    Pulse Readings from Last 3 Encounters:  01/22/19 79  12/27/18 70  10/18/18 70    Lab Results  Component Value Date   CREATININE 1.21 (H) 11/28/2018   BUN 19 11/28/2018   NA 139 11/28/2018   K 4.5 11/28/2018   CL 103 11/28/2018   CO2 27 11/28/2018  cr was up that check  She is "Trying" to drink more water  She drinks more at night to catch up  More than she has been    Pulse Readings from Last 3 Encounters:  01/22/19 79  12/27/18 70  10/18/18 70   Hypothyroidism  Pt has no clinical changes No change in energy level/ hair or skin/ edema and no tremor Lab Results  Component Value Date   TSH 9.22 (H) 11/28/2018    Compliance issues with levothyroxine Is doing better     Hyperlipidemia Lab Results  Component Value Date   CHOL 267 (H) 10/04/2018   HDL 74.70 10/04/2018   LDLCALC 171 (H) 10/04/2018   LDLDIRECT 211.2 07/13/2010   TRIG 103.0 10/04/2018   CHOLHDL 4 10/04/2018   Intolerant of all statins and zetia   B12 def Lab Results  Component Value Date   VITAMINB12 197 (L) 11/28/2018   Oral supplementation -was not  taking  Better compliance   Vit D level 22.0 Was not taking supplement  Better compliance-not perfect    Prediabetes Lab Results  Component Value Date   HGBA1C 6.2 10/04/2018    Patient Active Problem List   Diagnosis Date Noted  . Left foot pain 01/22/2019  . Epistaxis 12/27/2018  . Hypertension, essential 10/06/2017  . Right hand pain 06/05/2017  . Abrasion of left ring finger 06/05/2017  . Injury caused by dog bite, initial encounter 05/19/2017  . Tricuspid regurgitation 12/09/2016  . Mitral regurgitation 12/09/2016  . Normochromic anemia 12/09/2016  . Chronic abdominal pain 10/18/2016  . Urine frequency 09/09/2016  . Malaise and fatigue 09/09/2016  . Sleep disorder 01/13/2016  . Female cystocele 12/30/2015  . Left shoulder pain 09/02/2015  . Neck pain 09/02/2015  . Routine general medical examination at a health care facility 07/22/2015  . Hearing loss 07/22/2015  . Prediabetes 07/22/2015  . Anxiety disorder 04/24/2015  . Tremor 12/20/2014  . Chronic pain 10/14/2014  . B12 deficiency 07/16/2014  . Vitamin D deficiency 07/16/2014  . Fatigue 07/04/2014  . Encounter for Medicare annual wellness exam 01/13/2014  .  Fall in home 12/24/2013  . History of breast cancer 12/18/2013  . Epiploic appendagitis 02/22/2013  . Dry mouth 02/04/2013  . Benign paroxysmal positional vertigo 09/14/2012  . Low back pain 09/07/2012  . Constipation 11/04/2011  . Aortic stenosis 08/31/2010  . Hyperlipidemia   . STRESS REACTION, ACUTE, WITH EMOTIONAL DISTURBANCE 10/12/2007  . [place] DISEASE, CERVICAL 10/12/2007  . Hypothyroidism 01/22/2007  . RAYNAUD'S SYNDROME 01/22/2007  . EXTERNAL HEMORRHOIDS 01/22/2007  . DIVERTICULOSIS, COLON 01/22/2007  . FIBROCYSTIC BREAST DISEASE 01/22/2007  . Osteoporosis 01/22/2007  . GERD 12/16/2006  . DYSPNEA ON EXERTION 12/16/2006  . TOBACCO ABUSE, HX OF 12/16/2006  . HYSTERECTOMY, HX OF 12/16/2006   Past Medical History:  Diagnosis Date  .  Anxiety   . Aortic stenosis    a. mild-mod by echo 2012.  . Cancer of left breast ([place] )    surgery at [facility]  . Chronic airway obstruction, not elsewhere classified   . Colitis, ischemic ([place]) 07/2000   ? infect  . Degeneration of cervical intervertebral disc   . Diffuse cystic mastopathy   . Diverticulosis of colon (without mention of hemorrhage)   . Esophageal reflux   . Essential hypertension   . External hemorrhoids without mention of complication   . Fibula fracture 2006  . Hyperlipidemia    a. intol of multiple statins.  . Lichen sclerosus    back/abd/vulvar  . Mastoiditis of right side 04/2005   admitted  . Mild mitral regurgitation 2012  . Moderate tricuspid regurgitation 2012  . Osteoporosis, unspecified   . Personal history of tobacco use, presenting hazards to health   . RA (rheumatoid arthritis) (HCC)    Sero-negative  . Raynaud's syndrome   . Spinal stenosis   . Stress at home    "caring for son who is disabled" (12/08/2016)  . Unspecified gastritis and gastroduodenitis without mention of hemorrhage   . Unspecified hypothyroidism    Past Surgical History:  Procedure Laterality Date  . ABDOMINAL HYSTERECTOMY  1968  . BREAST BIOPSY Left   . BREAST LUMPECTOMY Left   . Cardiollite  02/2002   Neg  . CATARACT EXTRACTION W/ INTRAOCULAR LENS IMPLANT Right   . COLONOSCOPY    . COLONOSCOPY    . DEXA  2001   OP,heel  . DEXA     OP hip T-2.94  . NASAL SINUS SURGERY  10/2002   Archie Endo 06/23/2010  . Nuclear Stress Test  11/07   Normal  . SHOULDER ARTHROSCOPY W/ ROTATOR CUFF REPAIR Right 06/2005   Archie Endo 06/23/2010  . TONSILLECTOMY    . US ECHOCARDIOGRAPHY  10/06   normal LVF EF 55-65%   Social History   Tobacco Use  . Smoking status: Former Smoker    Packs/day: 2.00    Years: 10.00    Pack years: 20.00    Types: Cigarettes    Quit date: 02/07/1962    Years since quitting: 56.9  . Smokeless tobacco: Never Used  Substance Use Topics  . Alcohol use: Yes     Alcohol/week: 0.0 standard drinks    Comment: 12/08/2016 "glass of wine a couple times/month"  . Drug use: No   Family History  Problem Relation Age of Onset  . Heart attack Father 11  . Coronary artery disease Sister 55       CABG  . Heart attack Brother 79  . Coronary artery disease Unknown        GF   Allergies  Allergen Reactions  . Ace Inhibitors  REACTION: cough  . Dm-Apap-Cpm Nausea Only  . Ezetimibe     REACTION: myalgia  . Ezetimibe-Simvastatin     REACTION: joints swell  . Macrobid [Nitrofurantoin]     ABD PAIN, ALSO BODY ACHES/PAIN  . Moxifloxacin   . Rosuvastatin     REACTION: myalgia  . Septra [Sulfamethoxazole-Trimethoprim]     Sick feeling   . Simvastatin     REACTION: myalgia  . Triaminic    Current Outpatient Medications on File Prior to Visit  Medication Sig Dispense Refill  . busPIRone (BUSPAR) 30 MG tablet Take 1 tablet (30 mg total) by mouth 2 (two) times daily. 180 tablet 3  . cholecalciferol (VITAMIN D3) 25 MCG (1000 UT) tablet Take 4,000 Units by mouth daily.    . cyanocobalamin ([facility] VITAMIN B12) 2000 MCG tablet Take 2,000 mcg by mouth daily.    Marland Kitchen HYDROcodone-acetaminophen (NORCO/VICODIN) 5-325 MG tablet Take 1 tablet by mouth at bedtime.    Marland Kitchen levothyroxine (SYNTHROID) 100 MCG tablet TAKE 1 TABLET BY MOUTH EVERY DAY 90 tablet 0  . olmesartan-hydrochlorothiazide (BENICAR HCT) 40-25 MG tablet Take 1 tablet by mouth daily. 90 tablet 3  . Polyethylene Glycol 3350 (MIRALAX PO) Take 17 g by mouth daily.      No current facility-administered medications on file prior to visit.    Review of Systems  Constitutional: Positive for fatigue. Negative for activity change, appetite change, fever and unexpected weight change.  HENT: Negative for congestion, ear pain, rhinorrhea, sinus pressure and sore throat.   Eyes: Negative for pain, redness and visual disturbance.  Respiratory: Negative for cough, shortness of breath and wheezing.   Cardiovascular:  Negative for chest pain and palpitations.  Gastrointestinal: Negative for abdominal pain, blood in stool, constipation and diarrhea.  Endocrine: Negative for polydipsia and polyuria.  Genitourinary: Negative for dysuria, frequency and urgency.  Musculoskeletal: Positive for arthralgias and back pain. Negative for myalgias.       L foot is biggest problem right now  Also some swelling  Skin: Negative for pallor and rash.  Allergic/Immunologic: Negative for environmental allergies.  Neurological: Negative for dizziness, syncope and headaches.  Hematological: Negative for adenopathy. Does not bruise/bleed easily.       Big stressors at home Feels overwhelmed  Psychiatric/Behavioral: Positive for sleep disturbance. Negative for decreased concentration and dysphoric mood. The patient is nervous/anxious.        Objective:   Physical Exam Constitutional:      General: She is not in acute distress.    Appearance: She is well-developed.  HENT:     Head: Normocephalic and atraumatic.  Eyes:     Conjunctiva/sclera: Conjunctivae normal.     Pupils: Pupils are equal, round, and reactive to light.  Neck:     Thyroid: No thyromegaly.     Vascular: No carotid bruit or JVD.  Cardiovascular:     Rate and Rhythm: Normal rate and regular rhythm.     Pulses:          Dorsalis pedis pulses are 1+ on the left side.     Heart sounds: Murmur present. No gallop.   Pulmonary:     Effort: Pulmonary effort is normal. No respiratory distress.     Breath sounds: Normal breath sounds. No wheezing or rales.  Abdominal:     General: Bowel sounds are normal. There is no distension or abdominal bruit.     Palpations: Abdomen is soft. There is no mass.     Tenderness: There is no  abdominal tenderness.  Musculoskeletal:     Cervical back: Normal range of motion and neck supple.     Left foot: Bunion present. No foot drop.  Feet:     Left foot:     Skin integrity: Skin integrity normal.     Toenail  Condition: Left toenails are abnormally thick.     Comments: Tender over mid first metatarsal on left foot with mild soft tissue swelling  No erythema or skin breakdown  Nl sensation to light touch  Nails are thickened and overgrown  Lymphadenopathy:     Cervical: No cervical adenopathy.  Skin:    General: Skin is warm and dry.     Coloration: Skin is not pale.     Findings: No erythema or rash.  Neurological:     Mental Status: She is alert. Mental status is at baseline.     Coordination: Coordination normal.     Deep Tendon Reflexes: Reflexes are normal and symmetric. Reflexes normal.  Psychiatric:        Attention and Perception: She is attentive.        Mood and Affect: Mood is anxious.        Cognition and Memory: Cognition normal.     Comments: Helpful daughter present            Assessment & Plan:   Problem List Items Addressed This Visit      Cardiovascular and Mediastinum   Hypertension, essential    bp in fair control at this time  BP Readings from Last 1 Encounters:  01/22/19 134/68   No changes needed Most recent labs reviewed  Disc lifstyle change with low sodium diet and exercise  Labs today  Last cr elevated- re check today with some improvement in hydration       Relevant Orders   Comprehensive metabolic panel   TSH     Endocrine   Hypothyroidism    TSH today  Pt admits to not total compliance with dosing-but better than it was  Long disc with family about this       Relevant Orders   TSH     Other   B12 deficiency    Level today  Per pt she is back on it but not perfect dosing Disc with family  Disc consequences of low B12 to neruo system       Relevant Orders   Vitamin B12   Vitamin D deficiency    Pt is back on but not perfect dosing  Disc imp to bone and overall health  Level today      Relevant Orders   VITAMIN D 25 Hydroxy (Vit-D Deficiency, Fractures)   Left foot pain - Primary    TTP over mid first  metatarsal Following stubbed toe/trauma over a month ago  Foot xray now  Recommend ice/elevation  Has nocro from pain specialist to use prn       Relevant Orders   DG Foot Complete Left

## 2019-01-22 NOTE — Assessment & Plan Note (Addendum)
bp in fair control at this time  BP Readings from Last 1 Encounters:  01/22/19 134/68   No changes needed Most recent labs reviewed  Disc lifstyle change with low sodium diet and exercise  Labs today  Last cr elevated- re check today with some improvement in hydration

## 2019-01-22 NOTE — Assessment & Plan Note (Signed)
TSH today  Pt admits to not total compliance with dosing-but better than it was  Long disc with family about this

## 2019-01-22 NOTE — Assessment & Plan Note (Signed)
TTP over mid first metatarsal Following stubbed toe/trauma over a month ago  Foot xray now  Recommend ice/elevation  Has nocro from pain specialist to use prn

## 2019-01-22 NOTE — Patient Instructions (Signed)
Labs today   Make every effort you can to take your medicines on a schedule  Take care of yourself  Xray of left foot for pain today-we will email/call you with a result   Please drink enough fluids Measure out 64 oz per day -finish it (water and some other beverages are fine)  This is very important for kidney health   B12 is important for brain function  Vitamin D is important for bone health   Thyroid is important for brain and body functions

## 2019-01-22 NOTE — Assessment & Plan Note (Signed)
Pt is back on but not perfect dosing  Disc imp to bone and overall health  Level today

## 2019-01-22 NOTE — Assessment & Plan Note (Signed)
Level today  Per pt she is back on it but not perfect dosing Disc with family  Disc consequences of low B12 to neruo system

## 2019-01-23 ENCOUNTER — Telehealth: Payer: Self-pay | Admitting: Family Medicine

## 2019-01-23 DIAGNOSIS — M79672 Pain in left foot: Secondary | ICD-10-CM

## 2019-01-23 NOTE — Telephone Encounter (Signed)
Referral done Will route to [facility]  

## 2019-01-23 NOTE — Telephone Encounter (Signed)
-----   Message from Tammi Sou, [place] sent at 01/23/2019 10:44 AM EST ----- Spoke with Daughter Romie Minus and she did view Dr. Marliss Coots comments on mychart. They do want to proceed with referral to Ortho, they would like to go to [facility]. Romie Minus asked that our [facility] call her to schedule appt. Rosaria Ferries advise of this, please put referral in

## 2019-01-24 DIAGNOSIS — M25572 Pain in left ankle and joints of left foot: Secondary | ICD-10-CM | POA: Diagnosis not present

## 2019-01-27 DIAGNOSIS — R69 Illness, unspecified: Secondary | ICD-10-CM | POA: Diagnosis not present

## 2019-01-27 DIAGNOSIS — R32 Unspecified urinary incontinence: Secondary | ICD-10-CM | POA: Diagnosis not present

## 2019-01-27 DIAGNOSIS — M810 Age-related osteoporosis without current pathological fracture: Secondary | ICD-10-CM | POA: Diagnosis not present

## 2019-01-27 DIAGNOSIS — M1990 Unspecified osteoarthritis, unspecified site: Secondary | ICD-10-CM | POA: Diagnosis not present

## 2019-01-27 DIAGNOSIS — M069 Rheumatoid arthritis, unspecified: Secondary | ICD-10-CM | POA: Diagnosis not present

## 2019-01-27 DIAGNOSIS — Z87891 Personal history of nicotine dependence: Secondary | ICD-10-CM | POA: Diagnosis not present

## 2019-01-27 DIAGNOSIS — Z79891 Long term (current) use of opiate analgesic: Secondary | ICD-10-CM | POA: Diagnosis not present

## 2019-01-27 DIAGNOSIS — G8929 Other chronic pain: Secondary | ICD-10-CM | POA: Diagnosis not present

## 2019-01-27 DIAGNOSIS — I10 Essential (primary) hypertension: Secondary | ICD-10-CM | POA: Diagnosis not present

## 2019-02-19 DIAGNOSIS — M546 Pain in thoracic spine: Secondary | ICD-10-CM | POA: Diagnosis not present

## 2019-02-19 DIAGNOSIS — M25512 Pain in left shoulder: Secondary | ICD-10-CM | POA: Diagnosis not present

## 2019-02-19 DIAGNOSIS — M542 Cervicalgia: Secondary | ICD-10-CM | POA: Diagnosis not present

## 2019-02-19 DIAGNOSIS — M25511 Pain in right shoulder: Secondary | ICD-10-CM | POA: Diagnosis not present

## 2019-02-20 DIAGNOSIS — M48061 Spinal stenosis, lumbar region without neurogenic claudication: Secondary | ICD-10-CM | POA: Diagnosis not present

## 2019-02-20 DIAGNOSIS — M4722 Other spondylosis with radiculopathy, cervical region: Secondary | ICD-10-CM | POA: Diagnosis not present

## 2019-02-20 DIAGNOSIS — M79672 Pain in left foot: Secondary | ICD-10-CM | POA: Diagnosis not present

## 2019-02-20 DIAGNOSIS — M47816 Spondylosis without myelopathy or radiculopathy, lumbar region: Secondary | ICD-10-CM | POA: Diagnosis not present

## 2019-02-27 ENCOUNTER — Other Ambulatory Visit: Payer: Self-pay

## 2019-02-27 ENCOUNTER — Ambulatory Visit (INDEPENDENT_AMBULATORY_CARE_PROVIDER_SITE_OTHER): Payer: Medicare HMO

## 2019-02-27 DIAGNOSIS — E538 Deficiency of other specified B group vitamins: Secondary | ICD-10-CM | POA: Diagnosis not present

## 2019-02-27 MED ORDER — CYANOCOBALAMIN 1000 MCG/ML IJ SOLN
1000.0000 ug | Freq: Once | INTRAMUSCULAR | Status: AC
  Administered 2019-02-27: 12:00:00 1000 ug via INTRAMUSCULAR

## 2019-02-27 NOTE — Progress Notes (Signed)
Per orders of Dr. Marne Tower, injection of B12 given by Crawford Tamura Y.  Patient tolerated injection well.  

## 2019-03-05 ENCOUNTER — Ambulatory Visit: Payer: Medicare HMO

## 2019-03-06 ENCOUNTER — Ambulatory Visit: Payer: Medicare HMO

## 2019-03-07 ENCOUNTER — Ambulatory Visit: Payer: Medicare HMO

## 2019-03-13 ENCOUNTER — Other Ambulatory Visit: Payer: Self-pay

## 2019-03-13 ENCOUNTER — Ambulatory Visit (INDEPENDENT_AMBULATORY_CARE_PROVIDER_SITE_OTHER): Payer: Medicare HMO | Admitting: *Deleted

## 2019-03-13 DIAGNOSIS — E538 Deficiency of other specified B group vitamins: Secondary | ICD-10-CM

## 2019-03-13 MED ORDER — CYANOCOBALAMIN 1000 MCG/ML IJ SOLN
1000.0000 ug | Freq: Once | INTRAMUSCULAR | Status: AC
  Administered 2019-03-13: 1000 ug via INTRAMUSCULAR

## 2019-03-13 NOTE — Progress Notes (Signed)
Per orders of Dr. Tower, injection of Vit B12 given by Kaytee Taliercio M. Patient tolerated injection well.  

## 2019-03-19 ENCOUNTER — Telehealth: Payer: Self-pay

## 2019-03-19 NOTE — Telephone Encounter (Signed)
Pt has nurse visit apt tomorrow. Need to screen her for covid. LVM

## 2019-03-20 ENCOUNTER — Ambulatory Visit (INDEPENDENT_AMBULATORY_CARE_PROVIDER_SITE_OTHER): Payer: Medicare HMO

## 2019-03-20 ENCOUNTER — Other Ambulatory Visit: Payer: Self-pay

## 2019-03-20 DIAGNOSIS — E538 Deficiency of other specified B group vitamins: Secondary | ICD-10-CM

## 2019-03-20 MED ORDER — CYANOCOBALAMIN 1000 MCG/ML IJ SOLN
1000.0000 ug | Freq: Once | INTRAMUSCULAR | Status: AC
  Administered 2019-03-20: 1000 ug via INTRAMUSCULAR

## 2019-03-20 NOTE — Progress Notes (Signed)
Per orders of Dr. Tower, injection of B12 ° given by Gwendola Hornaday. °Patient tolerated injection well. ° °

## 2019-03-27 ENCOUNTER — Ambulatory Visit: Payer: Medicare HMO

## 2019-04-01 DIAGNOSIS — R69 Illness, unspecified: Secondary | ICD-10-CM | POA: Diagnosis not present

## 2019-04-02 ENCOUNTER — Telehealth: Payer: Self-pay

## 2019-04-02 NOTE — Telephone Encounter (Signed)
Pt has NV tomorrow and needs screened for covid. No answer and no VM.

## 2019-04-03 ENCOUNTER — Ambulatory Visit (INDEPENDENT_AMBULATORY_CARE_PROVIDER_SITE_OTHER): Payer: Medicare HMO

## 2019-04-03 ENCOUNTER — Other Ambulatory Visit: Payer: Self-pay

## 2019-04-03 DIAGNOSIS — E538 Deficiency of other specified B group vitamins: Secondary | ICD-10-CM

## 2019-04-03 MED ORDER — CYANOCOBALAMIN 1000 MCG/ML IJ SOLN
1000.0000 ug | Freq: Once | INTRAMUSCULAR | Status: AC
  Administered 2019-04-03: 16:00:00 1000 ug via INTRAMUSCULAR

## 2019-04-03 NOTE — Progress Notes (Signed)
Per orders of Dr. Tower, injection of vit B12 given by Khanh Tanori. Patient tolerated injection well.  

## 2019-04-25 ENCOUNTER — Ambulatory Visit (INDEPENDENT_AMBULATORY_CARE_PROVIDER_SITE_OTHER): Payer: Medicare HMO | Admitting: Family Medicine

## 2019-04-25 ENCOUNTER — Encounter: Payer: Self-pay | Admitting: Family Medicine

## 2019-04-25 ENCOUNTER — Other Ambulatory Visit: Payer: Self-pay

## 2019-04-25 VITALS — BP 142/86 | HR 87 | Temp 97.6°F | Ht 61.5 in | Wt 136.5 lb

## 2019-04-25 DIAGNOSIS — L03213 Periorbital cellulitis: Secondary | ICD-10-CM | POA: Diagnosis not present

## 2019-04-25 DIAGNOSIS — H5711 Ocular pain, right eye: Secondary | ICD-10-CM

## 2019-04-25 DIAGNOSIS — H538 Other visual disturbances: Secondary | ICD-10-CM | POA: Diagnosis not present

## 2019-04-25 DIAGNOSIS — H00012 Hordeolum externum right lower eyelid: Secondary | ICD-10-CM | POA: Diagnosis not present

## 2019-04-25 MED ORDER — AMOXICILLIN 500 MG PO CAPS: 1000.0000 mg | ORAL_CAPSULE | Freq: Two times a day (BID) | ORAL | 0 refills | Status: DC

## 2019-04-25 NOTE — Progress Notes (Signed)
Chief Complaint  Patient presents with  . Eye Problem    right eye red swollen  . Blurred Vision    both eyes    History of Present Illness: HPI  86 year old female  Patient of Dr. Marliss Coots with complicated past medical history presents for right eye redness and swelling.  yesterday noted small red area on right lower lid.  Sudden onset this AM .. discharge, was stuck together.  Not itchy. No foreign body.  Right face pain.. right lower lid.. pain radiates into  Face and neck.  She has noted blurred vision in bilateral eyes.. chronic issue in left eye, but today blurred vision.   No fever, no congestion no cough.   She does not wear, glasses or contacts.   She has not tried to treat it.  No sick contacts    This visit occurred during the SARS-CoV-2 public health emergency.  Safety protocols were in place, including screening questions prior to the visit, additional usage of staff PPE, and extensive cleaning of exam room while observing appropriate contact time as indicated for disinfecting solutions.   COVID 19 screen:  No recent travel or known exposure to COVID19 The patient denies respiratory symptoms of COVID 19 at this time. The importance of social distancing was discussed today.     Review of Systems  Constitutional: Negative for chills and fever.  HENT: Negative for congestion and ear pain.   Eyes: Positive for blurred vision, pain, discharge and redness.  Respiratory: Negative for cough and shortness of breath.   Cardiovascular: Negative for chest pain, palpitations and leg swelling.  Gastrointestinal: Negative for abdominal pain, blood in stool, constipation, diarrhea, nausea and vomiting.  Genitourinary: Negative for dysuria.  Musculoskeletal: Negative for falls and myalgias.  Skin: Negative for rash.  Neurological: Negative for dizziness.  Psychiatric/Behavioral: Negative for depression. The patient is not nervous/anxious.       Past Medical History:    Diagnosis Date  . Anxiety   . Aortic stenosis    a. mild-mod by echo 2012.  . Cancer of left breast ([place])    surgery at [facility]  . Chronic airway obstruction, not elsewhere classified   . Colitis, ischemic ([place]) 07/2000   ? infect  . Degeneration of cervical intervertebral disc   . Diffuse cystic mastopathy   . Diverticulosis of colon (without mention of hemorrhage)   . Esophageal reflux   . Essential hypertension   . External hemorrhoids without mention of complication   . Fibula fracture 2006  . Hyperlipidemia    a. intol of multiple statins.  . Lichen sclerosus    back/abd/vulvar  . Mastoiditis of right side 04/2005   admitted  . Mild mitral regurgitation 2012  . Moderate tricuspid regurgitation 2012  . Osteoporosis, unspecified   . Personal history of tobacco use, presenting hazards to health   . RA (rheumatoid arthritis) ([facility])    Sero-negative  . Raynaud's syndrome   . Spinal stenosis   . Stress at home    "caring for son who is disabled" (12/08/2016)  . Unspecified gastritis and gastroduodenitis without mention of hemorrhage   . Unspecified hypothyroidism     reports that she quit smoking about 57 years ago. Her smoking use included cigarettes. She has a 20.00 pack-year smoking history. She has never used smokeless tobacco. She reports current alcohol use. She reports that she does not use drugs.   Current Outpatient Medications:  .  busPIRone (BUSPAR) 30 MG tablet, Take 1 tablet (  30 mg total) by mouth 2 (two) times daily., Disp: 180 tablet, Rfl: 3 .  cholecalciferol (VITAMIN D3) 25 MCG (1000 UT) tablet, Take 4,000 Units by mouth daily., Disp: , Rfl:  .  cyanocobalamin ([employer] VITAMIN B12) 2000 MCG tablet, Take 2,000 mcg by mouth daily., Disp: , Rfl:  .  HYDROcodone-acetaminophen (NORCO/VICODIN) 5-325 MG tablet, Take 1 tablet by mouth at bedtime., Disp: , Rfl:  .  levothyroxine (SYNTHROID) 100 MCG tablet, TAKE 1 TABLET BY MOUTH EVERY DAY, Disp: 90 tablet, Rfl: 0 .   olmesartan-hydrochlorothiazide (BENICAR HCT) 40-25 MG tablet, Take 1 tablet by mouth daily., Disp: 90 tablet, Rfl: 3 .  Polyethylene Glycol 3350 (MIRALAX PO), Take 17 g by mouth daily. , Disp: , Rfl:    Observations/Objective: Blood pressure (!) 142/86, pulse 87, temperature 97.6 F (36.4 C), temperature source Temporal, height 5' 1.5" (1.562 m), weight 136 lb 8 oz (61.9 kg), SpO2 99 %.  Physical Exam Constitutional:      General: She is not in acute distress.    Appearance: Normal appearance. She is well-developed. She is not ill-appearing or toxic-appearing.  HENT:     Head: Normocephalic.     Right Ear: Hearing, tympanic membrane, ear canal and external ear normal. Tympanic membrane is not erythematous, retracted or bulging.     Left Ear: Hearing, tympanic membrane, ear canal and external ear normal. Tympanic membrane is not erythematous, retracted or bulging.     Nose: No mucosal edema or rhinorrhea.     Right Sinus: No maxillary sinus tenderness or frontal sinus tenderness.     Left Sinus: No maxillary sinus tenderness or frontal sinus tenderness.     Mouth/Throat:     Pharynx: Uvula midline.  Eyes:     General: Lids are normal. Lids are everted, no foreign bodies appreciated.        Right eye: Discharge and hordeolum present. No foreign body.        Left eye: Discharge present.No foreign body.     Extraocular Movements: Extraocular movements intact.     Right eye: Normal extraocular motion.     Left eye: Normal extraocular motion.     Conjunctiva/sclera: Conjunctivae normal.     Right eye: Right conjunctiva is not injected. Exudate present. No hemorrhage.    Left eye: No exudate.    Pupils: Pupils are equal, round, and reactive to light.      Comments: Area of swelling on lower lid ( very tender to palpation, copious discharge thick yellow in right eye  no conjunctivitis Diffuse swelling below eye over lower lid and lower face.  Neck:     Thyroid: No thyroid mass or  thyromegaly.     Vascular: No carotid bruit.     Trachea: Trachea normal.  Cardiovascular:     Rate and Rhythm: Normal rate and regular rhythm.     Pulses: Normal pulses.     Heart sounds: Normal heart sounds, S1 normal and S2 normal. No murmur. No friction rub. No gallop.   Pulmonary:     Effort: Pulmonary effort is normal. No tachypnea or respiratory distress.     Breath sounds: Normal breath sounds. No decreased breath sounds, wheezing, rhonchi or rales.  Abdominal:     General: Bowel sounds are normal.     Palpations: Abdomen is soft.     Tenderness: There is no abdominal tenderness.  Musculoskeletal:     Cervical back: Normal range of motion and neck supple.  Skin:    General:  Skin is warm and dry.     Findings: No rash.  Neurological:     Mental Status: She is alert.  Psychiatric:        Mood and Affect: Mood is not anxious or depressed.        Speech: Speech normal.        Behavior: Behavior normal. Behavior is cooperative.        Thought Content: Thought content normal.        Judgment: Judgment normal.      Assessment and Plan  Not clearly simple viral conjunctivitis. No clear S/S of red eye issues.  Concerning for hordeolum with progression to  Periorbital/preseptal cellulitis.  Will treat with oral antibiotics and warm compresses.  Refer to eye MD for full exam given  Right eye [ain and blurred vision bilaterally chronically and worse in right eye now. If not improving in 24 hour will change to more urgent referral and consider broadening antibiotics.     Eliezer Lofts, MD

## 2019-04-25 NOTE — Patient Instructions (Addendum)
Warm compresses on right eye.  Compete antibiotics x 10 days  If not improving in next 24 hours.. call for more urgent  Referral to eye doctor.  Given pain and age.. will set up with eye MD for full eye exam nonurgently.

## 2019-06-20 ENCOUNTER — Ambulatory Visit (INDEPENDENT_AMBULATORY_CARE_PROVIDER_SITE_OTHER): Payer: Medicare HMO | Admitting: Family Medicine

## 2019-06-20 ENCOUNTER — Encounter: Payer: Self-pay | Admitting: Family Medicine

## 2019-06-20 ENCOUNTER — Other Ambulatory Visit: Payer: Self-pay

## 2019-06-20 VITALS — BP 142/60 | HR 78 | Temp 98.2°F | Ht 61.5 in

## 2019-06-20 DIAGNOSIS — R102 Pelvic and perineal pain: Secondary | ICD-10-CM

## 2019-06-20 LAB — POCT WET PREP (WET MOUNT): Trichomonas Wet Prep HPF POC: ABSENT

## 2019-06-20 MED ORDER — FLUCONAZOLE 150 MG PO TABS
ORAL_TABLET | ORAL | 0 refills | Status: DC
Start: 2019-06-20 — End: 2019-10-16

## 2019-06-20 MED ORDER — CLOBETASOL PROPIONATE 0.05 % EX OINT: 1.0000 "application " | TOPICAL_OINTMENT | Freq: Every day | CUTANEOUS | 0 refills | Status: DC

## 2019-06-20 MED ORDER — TRAMADOL HCL 50 MG PO TABS: 50.0000 mg | ORAL_TABLET | Freq: Three times a day (TID) | ORAL | 0 refills | Status: AC | PRN

## 2019-06-20 NOTE — Patient Instructions (Signed)
I think you have yeast and also inflammation of vulva  There is evidence of possible lichen sclerosis   Take the diflucan pill (oral) today and one more in 3 days   Use the clobetasol cream daily (bedtime) daily  Follow up with gyn as planned   If suddenly worse or intolerable go to the ER

## 2019-06-20 NOTE — Progress Notes (Signed)
Subjective:    Patient ID: Dawn Aguirre, female    DOB: February 23, 1925, 86 y.o.   MRN: [id number]  This visit occurred during the SARS-CoV-2 public health emergency.  Safety protocols were in place, including screening questions prior to the visit, additional usage of staff PPE, and extensive cleaning of exam room while observing appropriate contact time as indicated for disinfecting solutions.    HPI Pt presents with pain/irritation of perineum/vagina   Wt Readings from Last 3 Encounters:  04/25/19 136 lb 8 oz (61.9 kg)  01/22/19 150 lb 5 oz (68.2 kg)  12/27/18 149 lb 6 oz (67.8 kg)   25.37 kg/m   Lost her son recently -lots of company    Symptoms started perhaps a few weeks ago  Not taking care of herself since she was caring for her son   Severe soreness of vaginal area  Used vaseline and otc "vaginal cream" More pain than itch Both sides  Feels like prolapse also into the vagina  Hurts to walk/sit or move   On amoxicillin recently for periorbital cellulitis ([place])  Has had a cystocele in the past  Saw uro/gyn   No past or recent hx of STD or HSV  Has gyn appointment with gyn on Wednesday (uro/gyn Dr Zigmund Daniel)   Lenard Forth prep today: Results for orders placed or performed in visit on 06/20/19  POCT Wet Prep [facility] [place])  Result Value Ref Range   Source Wet Prep POC vaginal    WBC, Wet Prep HPF POC many    Bacteria Wet Prep HPF POC Few Few   BACTERIA WET PREP MORPHOLOGY POC     Clue Cells Wet Prep HPF POC Few (A) None   Clue Cells Wet Prep [place] POC     Yeast Wet Prep HPF POC Moderate (A) None   KOH Wet Prep POC Moderate (A) None   Trichomonas Wet Prep HPF POC Absent Absent   v  Patient Active Problem List   Diagnosis Date Noted  . Vaginal pain 06/20/2019  . Left foot pain 01/22/2019  . Epistaxis 12/27/2018  . Hypertension, essential 10/06/2017  . Right hand pain 06/05/2017  . Abrasion of left ring finger 06/05/2017  . Injury caused by dog bite, initial  encounter 05/19/2017  . Tricuspid regurgitation 12/09/2016  . Mitral regurgitation 12/09/2016  . Normochromic anemia 12/09/2016  . Chronic abdominal pain 10/18/2016  . Urine frequency 09/09/2016  . Malaise and fatigue 09/09/2016  . Sleep disorder 01/13/2016  . Female cystocele 12/30/2015  . Left shoulder pain 09/02/2015  . Neck pain 09/02/2015  . Routine general medical examination at a health care facility 07/22/2015  . Hearing loss 07/22/2015  . Prediabetes 07/22/2015  . Anxiety disorder 04/24/2015  . Tremor 12/20/2014  . Chronic pain 10/14/2014  . B12 deficiency 07/16/2014  . Vitamin D deficiency 07/16/2014  . Fatigue 07/04/2014  . Encounter for Medicare annual wellness exam 01/13/2014  . Fall in home 12/24/2013  . History of breast cancer 12/18/2013  . Epiploic appendagitis 02/22/2013  . Dry mouth 02/04/2013  . Benign paroxysmal positional vertigo 09/14/2012  . Low back pain 09/07/2012  . Constipation 11/04/2011  . Aortic stenosis 08/31/2010  . Hyperlipidemia   . STRESS REACTION, ACUTE, WITH EMOTIONAL DISTURBANCE 10/12/2007  . [place] DISEASE, CERVICAL 10/12/2007  . Hypothyroidism 01/22/2007  . RAYNAUD'S SYNDROME 01/22/2007  . EXTERNAL HEMORRHOIDS 01/22/2007  . DIVERTICULOSIS, COLON 01/22/2007  . FIBROCYSTIC BREAST DISEASE 01/22/2007  . Osteoporosis 01/22/2007  . GERD 12/16/2006  . DYSPNEA  ON EXERTION 12/16/2006  . TOBACCO ABUSE, HX OF 12/16/2006  . HYSTERECTOMY, HX OF 12/16/2006   Past Medical History:  Diagnosis Date  . Anxiety   . Aortic stenosis    a. mild-mod by echo 2012.  . Cancer of left breast ([place])    surgery at [facility]  . Chronic airway obstruction, not elsewhere classified   . Colitis, ischemic ([place]) 07/2000   ? infect  . Degeneration of cervical intervertebral disc   . Diffuse cystic mastopathy   . Diverticulosis of colon (without mention of hemorrhage)   . Esophageal reflux   . Essential hypertension   . External hemorrhoids without mention of  complication   . Fibula fracture 2006  . Hyperlipidemia    a. intol of multiple statins.  . Lichen sclerosus    back/abd/vulvar  . Mastoiditis of right side 04/2005   admitted  . Mild mitral regurgitation 2012  . Moderate tricuspid regurgitation 2012  . Osteoporosis, unspecified   . Personal history of tobacco use, presenting hazards to health   . RA (rheumatoid arthritis) ([facility])    Sero-negative  . Raynaud's syndrome   . Spinal stenosis   . Stress at home    "caring for son who is disabled" (12/08/2016)  . Unspecified gastritis and gastroduodenitis without mention of hemorrhage   . Unspecified hypothyroidism    Past Surgical History:  Procedure Laterality Date  . ABDOMINAL HYSTERECTOMY  1968  . BREAST BIOPSY Left   . BREAST LUMPECTOMY Left   . Cardiollite  02/2002   Neg  . CATARACT EXTRACTION W/ INTRAOCULAR LENS IMPLANT Right   . COLONOSCOPY    . COLONOSCOPY    . DEXA  2001   OP,heel  . DEXA     OP hip T-2.94  . NASAL SINUS SURGERY  10/2002   Archie Endo 06/23/2010  . Nuclear Stress Test  11/07   Normal  . SHOULDER ARTHROSCOPY W/ ROTATOR CUFF REPAIR Right 06/2005   Archie Endo 06/23/2010  . TONSILLECTOMY    . US ECHOCARDIOGRAPHY  10/06   normal LVF EF 55-65%   Social History   Tobacco Use  . Smoking status: Former Smoker    Packs/day: 2.00    Years: 10.00    Pack years: 20.00    Types: Cigarettes    Quit date: 02/07/1962    Years since quitting: 57.4  . Smokeless tobacco: Never Used  Substance Use Topics  . Alcohol use: Yes    Alcohol/week: 0.0 standard drinks    Comment: 12/08/2016 "glass of wine a couple times/month"  . Drug use: No   Family History  Problem Relation Age of Onset  . Heart attack Father 53  . Coronary artery disease Sister 74       CABG  . Heart attack Brother 15  . Coronary artery disease Unknown        GF   Allergies  Allergen Reactions  . Ace Inhibitors     REACTION: cough  . Dm-Apap-Cpm Nausea Only  . Ezetimibe     REACTION: myalgia    . Ezetimibe-Simvastatin     REACTION: joints swell  . Macrobid [Nitrofurantoin]     ABD PAIN, ALSO BODY ACHES/PAIN  . Moxifloxacin   . Rosuvastatin     REACTION: myalgia  . Septra [Sulfamethoxazole-Trimethoprim]     Sick feeling   . Simvastatin     REACTION: myalgia  . Triaminic    Current Outpatient Medications on File Prior to Visit  Medication Sig Dispense Refill  .  busPIRone (BUSPAR) 30 MG tablet Take 1 tablet (30 mg total) by mouth 2 (two) times daily. 180 tablet 3  . cholecalciferol (VITAMIN D3) 25 MCG (1000 UT) tablet Take 4,000 Units by mouth daily.    . cyanocobalamin ([employer]S VITAMIN B12) 2000 MCG tablet Take 2,000 mcg by mouth daily.    Marland Kitchen levothyroxine (SYNTHROID) 100 MCG tablet TAKE 1 TABLET BY MOUTH EVERY DAY 90 tablet 0  . olmesartan-hydrochlorothiazide (BENICAR HCT) 40-25 MG tablet Take 1 tablet by mouth daily. 90 tablet 3  . Polyethylene Glycol 3350 (MIRALAX PO) Take 17 g by mouth daily.      No current facility-administered medications on file prior to visit.    Review of Systems  Constitutional: Positive for appetite change. Negative for activity change, fatigue, fever and unexpected weight change.       Decreased appetite Lost her son recently   HENT: Negative for congestion, ear pain, rhinorrhea, sinus pressure and sore throat.   Eyes: Negative for pain, redness and visual disturbance.  Respiratory: Negative for cough, shortness of breath and wheezing.   Cardiovascular: Negative for chest pain and palpitations.  Gastrointestinal: Negative for abdominal pain, blood in stool, constipation and diarrhea.  Endocrine: Negative for polydipsia and polyuria.  Genitourinary: Positive for vaginal discharge and vaginal pain. Negative for dysuria, frequency, urgency and vaginal bleeding.       External vaginal pain and swelling ?if prolapse    Musculoskeletal: Negative for arthralgias, back pain and myalgias.  Skin: Negative for pallor and rash.  Allergic/Immunologic:  Negative for environmental allergies.  Neurological: Negative for dizziness, syncope and headaches.  Hematological: Negative for adenopathy. Does not bruise/bleed easily.       Grief -lost her son recently  Psychiatric/Behavioral: Positive for dysphoric mood. Negative for decreased concentration. The patient is not nervous/anxious.        Objective:   Physical Exam Constitutional:      Appearance: Normal appearance. She is not ill-appearing.     Comments: Walking slowly due to pain  Needs assist to get on the table  Most comfortable when lying down  Eyes:     General: No scleral icterus.    Conjunctiva/sclera: Conjunctivae normal.     Pupils: Pupils are equal, round, and reactive to light.  Cardiovascular:     Rate and Rhythm: Normal rate.     Heart sounds: Murmur present.  Abdominal:     General: Abdomen is flat. Bowel sounds are normal.     Palpations: Abdomen is soft.     Comments: No suprapubic tenderness or fullness    Genitourinary:    Comments: Very swollen labia minora (and less so majora) on both sides with erythema and pale plaque (no ulceration) internally  Unable to do internal exam due to exquisite tenderness Was able to get swab for wet prep which showed some yeast buds  Scant pale discharge   Musculoskeletal:     Cervical back: Normal range of motion.  Lymphadenopathy:     Cervical: No cervical adenopathy.  Skin:    General: Skin is warm and dry.     Coloration: Skin is not pale.     Findings: No rash.  Neurological:     Mental Status: She is alert. Mental status is at baseline.  Psychiatric:        Mood and Affect: Mood is depressed.     Comments: Pt voices grief from recent loss of her son  Overall getting by  Assessment & Plan:   Problem List Items Addressed This Visit      Other   Vaginal pain - Primary    With marked swelling of labia minora-also plaques with mucosal breakdown and erythema  Scant d/c- wet prep revealed  yeast Given appearance and history (may have been going on longer than she thought)- lichen sclerosis is in the differential  Unable to do internal exam due to exquisite tenderness Will tx yeast with diflucan 150 two doses 3 days apart Clobetasol cream to use topically at bedtime  Tramadol prn pain with caution of sedation/falls inst to f/u with gyn as planned on Wednesday  If worsened in the meantime- update  Family will help with mobility        Relevant Orders   POCT Wet Prep [facility]) (Completed)

## 2019-06-20 NOTE — Assessment & Plan Note (Addendum)
With marked swelling of labia minora-also plaques with mucosal breakdown and erythema  Scant d/c- wet prep revealed yeast Given appearance and history (may have been going on longer than she thought)- lichen sclerosis is in the differential  Unable to do internal exam due to exquisite tenderness Will tx yeast with diflucan 150 two doses 3 days apart Clobetasol cream to use topically at bedtime  Tramadol prn pain with caution of sedation/falls inst to f/u with gyn as planned on Wednesday  If worsened in the meantime- update  Family will help with mobility

## 2019-06-21 ENCOUNTER — Telehealth: Payer: Self-pay

## 2019-06-21 NOTE — Telephone Encounter (Signed)
04/25/19 Ophthamology Referral closed.  See Referral Notes/hx.  Pt has not responded to phone calls by [facility].

## 2019-06-22 ENCOUNTER — Emergency Department (HOSPITAL_COMMUNITY)
Admission: EM | Admit: 2019-06-22 | Discharge: 2019-06-22 | Disposition: A | Discharge status: Home / Self Care | Payer: Medicare HMO | Attending: Emergency Medicine | Admitting: Emergency Medicine

## 2019-06-22 ENCOUNTER — Encounter (HOSPITAL_COMMUNITY): Payer: Self-pay | Admitting: *Deleted

## 2019-06-22 ENCOUNTER — Other Ambulatory Visit: Payer: Self-pay

## 2019-06-22 ENCOUNTER — Emergency Department (HOSPITAL_COMMUNITY): Payer: Medicare HMO

## 2019-06-22 DIAGNOSIS — R102 Pelvic and perineal pain: Secondary | ICD-10-CM | POA: Diagnosis not present

## 2019-06-22 DIAGNOSIS — R300 Dysuria: Secondary | ICD-10-CM | POA: Diagnosis not present

## 2019-06-22 DIAGNOSIS — N898 Other specified noninflammatory disorders of vagina: Secondary | ICD-10-CM | POA: Insufficient documentation

## 2019-06-22 DIAGNOSIS — Z79899 Other long term (current) drug therapy: Secondary | ICD-10-CM | POA: Insufficient documentation

## 2019-06-22 DIAGNOSIS — I10 Essential (primary) hypertension: Secondary | ICD-10-CM | POA: Diagnosis not present

## 2019-06-22 DIAGNOSIS — Z853 Personal history of malignant neoplasm of breast: Secondary | ICD-10-CM | POA: Insufficient documentation

## 2019-06-22 DIAGNOSIS — Z87891 Personal history of nicotine dependence: Secondary | ICD-10-CM | POA: Insufficient documentation

## 2019-06-22 DIAGNOSIS — K5730 Diverticulosis of large intestine without perforation or abscess without bleeding: Secondary | ICD-10-CM | POA: Diagnosis not present

## 2019-06-22 LAB — COMPREHENSIVE METABOLIC PANEL
ALT: 10 U/L (ref 0–44)
AST: 11 U/L — ABNORMAL LOW (ref 15–41)
Albumin: 3 g/dL — ABNORMAL LOW (ref 3.5–5.0)
Alkaline Phosphatase: 60 U/L (ref 38–126)
Anion gap: 15 (ref 5–15)
BUN: 24 mg/dL — ABNORMAL HIGH (ref 8–23)
CO2: 20 mmol/L — ABNORMAL LOW (ref 22–32)
Calcium: 9 mg/dL (ref 8.9–10.3)
Chloride: 103 mmol/L (ref 98–111)
Creatinine, Ser: 1.09 mg/dL — ABNORMAL HIGH (ref 0.44–1.00)
GFR calc Af Amer: 51 mL/min — ABNORMAL LOW (ref >60)
GFR calc non Af Amer: 44 mL/min — ABNORMAL LOW (ref >60)
Glucose, Bld: 133 mg/dL — ABNORMAL HIGH (ref 70–99)
Potassium: 4.2 mmol/L (ref 3.5–5.1)
Sodium: 138 mmol/L (ref 135–145)
Total Bilirubin: 0.4 mg/dL (ref 0.3–1.2)
Total Protein: 7 g/dL (ref 6.5–8.1)

## 2019-06-22 LAB — URINALYSIS, ROUTINE W REFLEX MICROSCOPIC
Bacteria, UA: NONE SEEN
Bilirubin Urine: NEGATIVE
Glucose, UA: NEGATIVE mg/dL
Ketones, ur: NEGATIVE mg/dL
Nitrite: NEGATIVE
Protein, ur: NEGATIVE mg/dL
Specific Gravity, Urine: 1.016 (ref 1.005–1.030)
pH: 6 (ref 5.0–8.0)

## 2019-06-22 LAB — CBC
HCT: 28.5 % — ABNORMAL LOW (ref 36.0–46.0)
Hemoglobin: 8.2 g/dL — ABNORMAL LOW (ref 12.0–15.0)
MCH: 22.7 pg — ABNORMAL LOW (ref 26.0–34.0)
MCHC: 28.8 g/dL — ABNORMAL LOW (ref 30.0–36.0)
MCV: 78.7 fL — ABNORMAL LOW (ref 80.0–100.0)
Platelets: 289 10*3/uL (ref 150–400)
RBC: 3.62 MIL/uL — ABNORMAL LOW (ref 3.87–5.11)
RDW: 16.3 % — ABNORMAL HIGH (ref 11.5–15.5)
WBC: 9.1 10*3/uL (ref 4.0–10.5)
nRBC: 0 % (ref 0.0–0.2)

## 2019-06-22 LAB — LACTIC ACID, PLASMA
Lactic Acid, Venous: 1.2 mmol/L (ref 0.5–1.9)
Lactic Acid, Venous: 1.1 mmol/L (ref 0.5–1.9)

## 2019-06-22 LAB — LIPASE, BLOOD: Lipase: 21 U/L (ref 11–51)

## 2019-06-22 MED ORDER — HYDROCODONE-ACETAMINOPHEN 5-325 MG PO TABS
1.0000 | ORAL_TABLET | Freq: Once | ORAL | Status: AC
  Administered 2019-06-22: 1 via ORAL
  Filled 2019-06-22: qty 1

## 2019-06-22 MED ORDER — SODIUM CHLORIDE 0.9% FLUSH
3.0000 mL | Freq: Once | INTRAVENOUS | Status: AC
  Administered 2019-06-22: 3 mL via INTRAVENOUS

## 2019-06-22 MED ORDER — ACETAMINOPHEN 325 MG PO TABS
650.0000 mg | ORAL_TABLET | Freq: Once | ORAL | Status: AC
  Administered 2019-06-22: 650 mg via ORAL
  Filled 2019-06-22: qty 2

## 2019-06-22 MED ORDER — CLINDAMYCIN HCL 150 MG PO CAPS: 450.0000 mg | ORAL_CAPSULE | Freq: Once | ORAL | Status: DC

## 2019-06-22 MED ORDER — IOHEXOL 300 MG/ML  SOLN
100.0000 mL | Freq: Once | INTRAMUSCULAR | Status: AC | PRN
  Administered 2019-06-22: 100 mL via INTRAVENOUS

## 2019-06-22 MED ORDER — SODIUM CHLORIDE 0.9 % IV BOLUS
1000.0000 mL | Freq: Once | INTRAVENOUS | Status: AC
  Administered 2019-06-22: 1000 mL via INTRAVENOUS

## 2019-06-22 MED ORDER — LIDOCAINE HCL URETHRAL/MUCOSAL 2 % EX GEL
1.0000 "application " | Freq: Once | CUTANEOUS | Status: AC
  Administered 2019-06-22: 1 via TOPICAL

## 2019-06-22 MED ORDER — CLINDAMYCIN HCL 150 MG PO CAPS: 450.0000 mg | ORAL_CAPSULE | Freq: Three times a day (TID) | ORAL | 0 refills | Status: AC

## 2019-06-22 MED ORDER — LIDOCAINE 2 % EX GEL: 1.0000 [in_us] | CUTANEOUS | 0 refills | Status: DC

## 2019-06-22 NOTE — ED Provider Notes (Signed)
[facility] EMERGENCY DEPARTMENT Provider Note   CSN: [id number] Arrival date & time: 06/22/19  1559     History Chief Complaint  Patient presents with  . Vaginal Pain    Dawn Aguirre is a 86 y.o. female presents to the ER for evaluation of vaginal pain.  Onset 1 month ago that has gradually worsened and has become severe in the last 1 week.  The pain is worse with walking, sitting and palpation, urination.  She was seen by her primary care doctor recently and was diagnosed with lichen sclerosis and yeast.  She was prescribed steroid cream, fluconazole and hydrocodone for pain.  States over the last couple of days the pain has worsened.  She called her primary care doctor who advised her to come to the ER for evaluation.  She denies fevers, chills.  No abdominal or flank pain.  Reports dysuria that she attributes to the skin being irritated around her vagina.  Has noticed clear slightly yellow vaginal discharge and scant amount of blood on her underwear and paper with wiping.  HPI     Past Medical History:  Diagnosis Date  . Anxiety   . Aortic stenosis    a. mild-mod by echo 2012.  . Cancer of left breast ([place])    surgery at [facility]  . Chronic airway obstruction, not elsewhere classified   . Colitis, ischemic ([place]) 07/2000   ? infect  . Degeneration of cervical intervertebral disc   . Diffuse cystic mastopathy   . Diverticulosis of colon (without mention of hemorrhage)   . Esophageal reflux   . Essential hypertension   . External hemorrhoids without mention of complication   . Fibula fracture 2006  . Hyperlipidemia    a. intol of multiple statins.  . Lichen sclerosus    back/abd/vulvar  . Mastoiditis of right side 04/2005   admitted  . Mild mitral regurgitation 2012  . Moderate tricuspid regurgitation 2012  . Osteoporosis, unspecified   . Personal history of tobacco use, presenting hazards to health   . RA (rheumatoid arthritis) (HCC)    Sero-negative   . Raynaud's syndrome   . Spinal stenosis   . Stress at home    "caring for son who is disabled" (12/08/2016)  . Unspecified gastritis and gastroduodenitis without mention of hemorrhage   . Unspecified hypothyroidism     Patient Active Problem List   Diagnosis Date Noted  . Vaginal pain 06/20/2019  . Left foot pain 01/22/2019  . Epistaxis 12/27/2018  . Hypertension, essential 10/06/2017  . Right hand pain 06/05/2017  . Abrasion of left ring finger 06/05/2017  . Injury caused by dog bite, initial encounter 05/19/2017  . Tricuspid regurgitation 12/09/2016  . Mitral regurgitation 12/09/2016  . Normochromic anemia 12/09/2016  . Chronic abdominal pain 10/18/2016  . Urine frequency 09/09/2016  . Malaise and fatigue 09/09/2016  . Sleep disorder 01/13/2016  . Female cystocele 12/30/2015  . Left shoulder pain 09/02/2015  . Neck pain 09/02/2015  . Routine general medical examination at a health care facility 07/22/2015  . Hearing loss 07/22/2015  . Prediabetes 07/22/2015  . Anxiety disorder 04/24/2015  . Tremor 12/20/2014  . Chronic pain 10/14/2014  . B12 deficiency 07/16/2014  . Vitamin D deficiency 07/16/2014  . Fatigue 07/04/2014  . Encounter for Medicare annual wellness exam 01/13/2014  . Fall in home 12/24/2013  . History of breast cancer 12/18/2013  . Epiploic appendagitis 02/22/2013  . Dry mouth 02/04/2013  . Benign paroxysmal positional  vertigo 09/14/2012  . Low back pain 09/07/2012  . Constipation 11/04/2011  . Aortic stenosis 08/31/2010  . Hyperlipidemia   . STRESS REACTION, ACUTE, WITH EMOTIONAL DISTURBANCE 10/12/2007  . [place] DISEASE, CERVICAL 10/12/2007  . Hypothyroidism 01/22/2007  . RAYNAUD'S SYNDROME 01/22/2007  . EXTERNAL HEMORRHOIDS 01/22/2007  . DIVERTICULOSIS, COLON 01/22/2007  . FIBROCYSTIC BREAST DISEASE 01/22/2007  . Osteoporosis 01/22/2007  . GERD 12/16/2006  . DYSPNEA ON EXERTION 12/16/2006  . TOBACCO ABUSE, HX OF 12/16/2006  . HYSTERECTOMY, HX  OF 12/16/2006    Past Surgical History:  Procedure Laterality Date  . ABDOMINAL HYSTERECTOMY  1968  . BREAST BIOPSY Left   . BREAST LUMPECTOMY Left   . Cardiollite  02/2002   Neg  . CATARACT EXTRACTION W/ INTRAOCULAR LENS IMPLANT Right   . COLONOSCOPY    . COLONOSCOPY    . DEXA  2001   OP,heel  . DEXA     OP hip T-2.94  . NASAL SINUS SURGERY  10/2002   Archie Endo 06/23/2010  . Nuclear Stress Test  11/07   Normal  . SHOULDER ARTHROSCOPY W/ ROTATOR CUFF REPAIR Right 06/2005   Archie Endo 06/23/2010  . TONSILLECTOMY    . US ECHOCARDIOGRAPHY  10/06   normal LVF EF 55-65%     OB History   No obstetric history on file.     Family History  Problem Relation Age of Onset  . Heart attack Father 19  . Coronary artery disease Sister 65       CABG  . Heart attack Brother 68  . Coronary artery disease Other        GF    Social History   Tobacco Use  . Smoking status: Former Smoker    Packs/day: 2.00    Years: 10.00    Pack years: 20.00    Types: Cigarettes    Quit date: 02/07/1962    Years since quitting: 57.4  . Smokeless tobacco: Never Used  Substance Use Topics  . Alcohol use: Yes    Alcohol/week: 0.0 standard drinks    Comment: 12/08/2016 "glass of wine a couple times/month"  . Drug use: No    Home Medications Prior to Admission medications   Medication Sig Start Date End Date Taking? Authorizing Provider  busPIRone (BUSPAR) 30 MG tablet Take 1 tablet (30 mg total) by mouth 2 (two) times daily. 10/04/18   Tower, Wynelle Fanny, MD  cholecalciferol (VITAMIN D3) 25 MCG (1000 UT) tablet Take 4,000 Units by mouth daily.    [provider]  clindamycin (CLEOCIN) 150 MG capsule Take 3 capsules (450 mg total) by mouth 3 (three) times daily for 7 days. 06/22/19 06/29/19  Kinnie Feil, PA-C  clobetasol ointment (TEMOVATE) [id number] % Apply 1 application topically daily. To affected vulvar areas daily at bedtime 06/20/19   Tower, [place] A, MD  cyanocobalamin ([employer] VITAMIN B12) 2000 MCG  tablet Take 2,000 mcg by mouth daily.    [provider]  fluconazole (DIFLUCAN) 150 MG tablet Take 1 pill by mouth today and repeat dose in 3 days 06/20/19   Tower, Wynelle Fanny, MD  levothyroxine (SYNTHROID) 100 MCG tablet TAKE 1 TABLET BY MOUTH EVERY DAY 12/10/18   Tower, [place] A, MD  Lidocaine 2 % GEL Apply 1 inch topically every 4 (four) hours. 06/22/19   Kinnie Feil, PA-C  olmesartan-hydrochlorothiazide (BENICAR HCT) 40-25 MG tablet Take 1 tablet by mouth daily. 10/04/18   Tower, Wynelle Fanny, MD  Polyethylene Glycol 3350 (MIRALAX PO) Take 17  g by mouth daily.     [provider]  traMADol (ULTRAM) 50 MG tablet Take 1-2 tablets (50-100 mg total) by mouth every 8 (eight) hours as needed for up to 5 days for severe pain. Caution of sedation and dizziness/fall risk 06/20/19 06/25/19  Tower, Wynelle Fanny, MD    Allergies    Ace inhibitors, Dm-apap-cpm, Ezetimibe, Ezetimibe-simvastatin, Macrobid [nitrofurantoin], Moxifloxacin, Rosuvastatin, Septra [sulfamethoxazole-trimethoprim], Simvastatin, and Triaminic  Review of Systems   Review of Systems  Genitourinary: Positive for vaginal bleeding, vaginal discharge and vaginal pain.  All other systems reviewed and are negative.   Physical Exam Updated Vital Signs BP (!) 153/79   Pulse 72   Temp 98.4 F (36.9 C)   Resp 18   Ht 5\' 1"  (1.549 m)   Wt 61.9 kg   SpO2 91%   BMI 25.78 kg/m   Physical Exam Vitals and nursing note reviewed.  Constitutional:      Appearance: She is well-developed.     Comments: Non toxic in NAD  HENT:     Head: Normocephalic and atraumatic.     Nose: Nose normal.  Eyes:     Conjunctiva/sclera: Conjunctivae normal.  Cardiovascular:     Rate and Rhythm: Normal rate and regular rhythm.  Pulmonary:     Effort: Pulmonary effort is normal.     Breath sounds: Normal breath sounds.  Abdominal:     General: Bowel sounds are normal.     Palpations: Abdomen is soft.     Tenderness: There is no abdominal  tenderness.     Comments: No G/R/R. No suprapubic or CVA tenderness. Negative Murphy's and McBurney's. Active BS to lower quadrants.   Genitourinary:    Labia:        Right: Tenderness present.        Left: Tenderness present.      Vagina: Vaginal discharge and tenderness present.     Comments: External genitalia edematous, tender, erythematous, friable.  There are skin colored and white/pale yellow adherent raised plaques on genitalia that is exquisitely tender.  Scant amount of pale yellow runny discharge from introitus.  No groin lymphadenopathy, tenderness or obvious abscess around external genitalia, introitus or perianally.  Internal pelvic exam deferred secondary to pain. Musculoskeletal:        General: Normal range of motion.     Cervical back: Normal range of motion.  Skin:    General: Skin is warm and dry.     Capillary Refill: Capillary refill takes less than 2 seconds.  Neurological:     Mental Status: She is alert.  Psychiatric:        Behavior: Behavior normal.     ED Results / Procedures / Treatments   Labs (all labs ordered are listed, but only abnormal results are displayed) Labs Reviewed  COMPREHENSIVE METABOLIC PANEL - Abnormal; Notable for the following components:      Result Value   CO2 20 (*)    Glucose, Bld 133 (*)    BUN 24 (*)    Creatinine, Ser 1.09 (*)    Albumin 3.0 (*)    AST 11 (*)    GFR calc non Af Amer 44 (*)    GFR calc Af Amer 51 (*)    All other components within normal limits  CBC - Abnormal; Notable for the following components:   RBC 3.62 (*)    Hemoglobin 8.2 (*)    HCT 28.5 (*)    MCV 78.7 (*)    [facility]  22.7 (*)    MCHC 28.8 (*)    RDW 16.3 (*)    All other components within normal limits  URINALYSIS, ROUTINE W REFLEX MICROSCOPIC - Abnormal; Notable for the following components:   Color, Urine STRAW (*)    Hgb urine dipstick SMALL (*)    Leukocytes,Ua SMALL (*)    All other components within normal limits  LIPASE, BLOOD    LACTIC ACID, PLASMA  LACTIC ACID, PLASMA    EKG None  Radiology CT PELVIS W CONTRAST  Result Date: 06/22/2019 CLINICAL DATA:  Vaginal pain EXAM: CT PELVIS WITH CONTRAST TECHNIQUE: Multidetector CT imaging of the pelvis was performed using the standard protocol following the bolus administration of intravenous contrast. CONTRAST:  168mL OMNIPAQUE IOHEXOL 300 MG/ML  SOLN COMPARISON:  CT abdomen pelvis 09/10/2004 FINDINGS: Urinary Tract: Inferior poles of the kidneys are unremarkable. No visible abnormality on the course of the ureter. Urinary bladder is free of acute abnormality. Bowel: Fecalization of the distal small bowel contents without evidence of high-grade obstruction. Moderate colonic stool burden. No colonic dilatation or wall thickening. Scattered colonic diverticula without focal pericolonic inflammation to suggest diverticulitis. Vascular/Lymphatic: Atherosclerotic calcifications throughout the distal aorta and branch vessels. No aneurysm or ectasia. No enlarged pelvic lymph nodes. Reproductive: Uterus is surgically absent. No concerning adnexal lesions. Other: There is marked edematous thickening of the volar soft tissues with mucosal hyperemia about the introitus. No organized collection or abscess is seen. No bowel containing hernia. No pelvic free air or fluid. Musculoskeletal: Degenerative changes in the lower lumbar spine additional degenerative changes in the both hips and SI joints. IMPRESSION: 1. Marked edematous thickening of the volar soft tissues with mucosal hyperemia about the introitus. Correlate with clinical exam findings. No discrete organized collection or abscess is seen. 2. Fecalization of the distal small bowel contents without evidence of high-grade obstruction. Findings may suggest slowed intestinal transit/constipation. 3. Colonic diverticulosis without evidence of diverticulitis. 4. Aortic Atherosclerosis (ICD10-I70.0). Electronically Signed   By: Lovena Le M.D.    On: 06/22/2019 20:56    Procedures Procedures (including critical care time)  Medications Ordered in ED Medications  clindamycin (CLEOCIN) capsule 450 mg (has no administration in time range)  sodium chloride flush (NS) 0.9 % injection 3 mL (3 mLs Intravenous Given 06/22/19 1958)  HYDROcodone-acetaminophen (NORCO/VICODIN) 5-325 MG per tablet 1 tablet (1 tablet Oral Given 06/22/19 1629)  lidocaine (XYLOCAINE) 2 % jelly 1 application (1 application Topical Given 06/22/19 2125)  HYDROcodone-acetaminophen (NORCO/VICODIN) 5-325 MG per tablet 1 tablet (1 tablet Oral Given 06/22/19 1958)  acetaminophen (TYLENOL) tablet 650 mg (650 mg Oral Given 06/22/19 1958)  sodium chloride 0.9 % bolus 1,000 mL (1,000 mLs Intravenous New Bag/Given 06/22/19 1958)  iohexol (OMNIPAQUE) 300 MG/ML solution 100 mL (100 mLs Intravenous Contrast Given 06/22/19 2023)    ED Course  I have reviewed the triage vital signs and the nursing notes.  Pertinent labs & imaging results that were available during my care of the patient were reviewed by me and considered in my medical decision making (see chart for details).  Clinical Course as of Jun 21 2157  Sat Jun 22, 2019  2140 Chalmers Guest): SMALL [CG]  2140 WBC, UA: 6-10 [CG]  2140 Bacteria, UA: NONE SEEN [CG]    Clinical Course User Index [CG] Kinnie Feil, PA-C   MDM Rules/Calculators/A&P                      I reviewed patient's EMR to  assist with MDM  Seen by PCP 2 days ago and diagnosed with lichen sclerosis and yeast vaginitis.  She was discharged with clobetasol, fluconazole and tramadol.  Exam today reveals adherent thick raised plaques around external genitalia with exquisite tenderness and friability.  I did not visualize or palpate any abscess or drainable fluid collection on my exam.  Internal exam unable to be performed secondary to pain.  Given limitation on exam and worsening pain, CT scan was obtained to rule out internal pelvic infectious  process or abscess.  Lab work and CT scan personally reviewed and interpreted by me.  Lab work and scans are reassuring and do not point to an infectious process or surgical process.  Lidocaine jelly applied topically here for comfort.  She was given first dose of antibiotic to cover for possible early secondary bacterial infection given that her pain has worsened.  Recommended close follow-up with PCP in the next 48 hours.  Recommended warm bath soaks, Tylenol, hydrocodone.  She is to take her second dose of fluconazole tomorrow and continue clobetasol as directed.  Patient has follow-up appointment with GYN in 5 days.  Strict return precautions discussed.  Daughter at bedside and agreeable.  Discussed with EDP.  Final Clinical Impression(s) / ED Diagnoses Final diagnoses:  Vaginal pain    Rx / DC Orders ED Discharge Orders         Ordered    clindamycin (CLEOCIN) 150 MG capsule  3 times daily     06/22/19 2155    Lidocaine 2 % GEL  Every 4 hours     06/22/19 2155           Kinnie Feil, PA-C 06/22/19 2159    Deno Etienne, DO 06/22/19 2202

## 2019-06-22 NOTE — ED Triage Notes (Signed)
The pt has an iinfection in her vaginal area with a drainage fort several days  She just buried her son and she did not want to deal with this until now.  No temp

## 2019-06-22 NOTE — ED Notes (Signed)
Patient verbalizes understanding of discharge instructions. Opportunity for questioning and answers were provided. Armband removed by staff, pt discharged from ED via wheelchair to go home with daughter.  

## 2019-06-22 NOTE — ED Notes (Addendum)
There is a listing of an allergy to vicodin but she was given vicodin  Friday for the pain

## 2019-06-22 NOTE — ED Notes (Signed)
Please call Reis when patient roomed at [phone]. She is car waiting.

## 2019-06-22 NOTE — ED Notes (Signed)
Dr Tyrone Nine  okd giving the vicodin since she is already taking it and she has had no problem with this

## 2019-06-22 NOTE — ED Notes (Signed)
The pt was seen by  hedr regular doctorf on Friday and she was given pain med and an antibiotic  Her last vicodin was at 1100

## 2019-06-22 NOTE — Discharge Instructions (Signed)
You were seen in the ER for vaginal pain  Lab work was ok.  Urinalysis was contaminated with some discharge but we will send for culture to ensure there is no infection in your urine. CT scan showed skin inflammation but no internal infection or abscess.   Lidocaine jelly has been prescribed to be used topically every 4-6 hours for a short period of time to help with acute severe pain.  Can take tylenol [phone] acetaminophen (tylenol) every 6 hours for mild to moderate pain. Hydrocodone 5 mg can be used for severe or break through pain every 4-6 hours, or as needed.  Take your second dose of fluconazole tomorrow.  Take clindamycin 450 mg every 8 hours to cover for possible secondary bacteria infection.  Try to do warm water bath sits throughout the day to help soothe pain and inflammation. Use a water bottle to spray warm bottle while urinating to help with stinging pain.   Follow up with primary care doctor in 48 hours to ensure symptoms are improving and pain is well controlled with above regimen  Follow up with OBGYN on Wednesday  Return to ER if you have uncontrollable or severe pain despite medicines, fever, chills, abdominal pain, flank pain or if you are unable to urinate due to pain

## 2019-06-24 ENCOUNTER — Telehealth: Payer: Self-pay

## 2019-06-24 NOTE — Telephone Encounter (Signed)
Patient is better but still very shaky.  Sleeping a lot, not eating or drinking a lot. GYN appt coming up soon.

## 2019-06-24 NOTE — Telephone Encounter (Signed)
I'm glad she is doing a little better-please enc better fluid intake and some protein supplement shakes if she does not feel like eating (her daughter can help with that)     fyi: also just lost her son and grieving so that also plays a role

## 2019-06-24 NOTE — Telephone Encounter (Signed)
I saw and reviewed the ER notes- they did a CT scan (no abscess or mass) and they did start her on antibiotic empirically and also gave her lidocaine gel for pain   Please check in with her to see how she is doing She has gyn appt later this week  Thanks

## 2019-06-24 NOTE — Telephone Encounter (Signed)
[facility] - Client TELEPHONE ADVICE RECORD [facility] Patient Name: VESSIE SCHOENTHALER Gender: Female DOB: 1925-04-24 Age: 86 Y 2 M 5 D Return Phone Number: [phone] (Primary) Address: City/State/Zip: [place] [address] Client [facility] - Client Client Site Reserve Physician Tower, Roque Lias - MD Contact Type Call Who Is Calling Patient / Member / Family / Caregiver Call Type Triage / Clinical Caller Name Deliah Goody Relationship To Patient Daughter Return Phone Number [phone] (Primary) Chief Complaint Vaginal Pain Reason for Call Symptomatic / Request for [place] [address] states her mother was seen on Thursday and diagnosed with a yeast infection. She is having severe pain and her pain medication is not helping. The pain is external. She was advised to ask for a topical lidocaine jelly. Translation No Nurse Assessment Nurse: Derrel Nip, RN, Santiago Glad Date/Time Eilene Ghazi Time): 06/22/2019 1:30:59 PM Confirm and document reason for call. If symptomatic, describe symptoms. ---Caller states that her mom was seen on Thursday for a yeast infection and was given clobetasol cream and diflucan - she is having no relief from the external pain - pain is worse with standing and now severe pain and swelling that is burning really bad Has the patient had close contact with a person known or suspected to have the novel coronavirus illness OR traveled / lives in area with major community spread (including international travel) in the last 14 days from the onset of symptoms? * If Asymptomatic, screen for exposure and travel within the last 14 days. ---No Does the patient have any new or worsening symptoms? ---Yes Will a triage be completed? ---Yes Related visit to physician within the last 2 weeks? ---Yes Does the PT have any chronic conditions? (i.e. diabetes, asthma, this  includes High risk factors for pregnancy, etc.) ---Yes List chronic conditions. ---HTN, Thyroid, Dengenerative Disk DX Is this a behavioral health or substance abuse call? ---NoPLEASE NOTE: All timestamps contained within this report are represented as [place] Standard Time. CONFIDENTIALTY NOTICE: This fax transmission is intended only for the addressee. It contains information that is legally privileged, confidential or otherwise protected from use or disclosure. If you are not the intended recipient, you are strictly prohibited from reviewing, disclosing, copying using or disseminating any of this information or taking any action in reliance on or regarding this information. If you have received this fax in error, please notify us immediately by telephone so that we can arrange for its return to [place]. Phone: [phone], Toll-Free: [phone], Fax: [phone] Page: 2 of 2 Call Id: [id number] Guidelines Guideline Title Affirmed Question Affirmed Notes Nurse Date/Time Eilene Ghazi Time) Vaginal Symptoms [1] Genital area looks infected (e.g., draining sore, spreading redness) AND [2] fever Chancy Hurter 06/22/2019 1:36:09 PM Disp. Time Eilene Ghazi Time) Disposition Final User 06/22/2019 1:41:06 PM Paged On Call back to [facility], RN, Santiago Glad 06/22/2019 1:38:14 PM See HCP within 4 Hours (or PCP triage) Yes Derrel Nip, RN, [place] Pellant Disagree/Comply Comply Caller Understands Yes PreDisposition Call Doctor Care Advice Given Per Guideline SEE HCP WITHIN 4 HOURS (OR PCP TRIAGE): * IF OFFICE WILL BE OPEN: You need to be seen within the next 3 or 4 hours. Call your doctor (or NP/PA) now or as soon as the office opens. * IF OFFICE WILL BE CLOSED AND NO PCP (PRIMARY CARE PROVIDER) SECOND-LEVEL TRIAGE: You need to be seen within the next 3 or 4 hours. A nearby Urgent Care Center [facility]) is often  a good source of care. Another choice is to go to the ED. Go sooner if you become worse.  ANTIBIOTIC OINTMENT: CALL BACK IF: * Fever or severe pain * You become worse. CARE ADVICE given per Vaginal Symptoms (Adult) guideline. Referrals REFERRED TO PCP OFFICE Paging DoctorName Phone DateTime Result/Outcome Message Type Notes Loura Pardon - [place] [phone] 06/22/2019 1:41:06 PM Paged On Call Back to Call Center Doctor Paged Delia Chimes [phone] Loura Pardon - MD 06/22/2019 1:51:38 PM Spoke with On Call - General Message Result Does not feel that lidocaine cream would be a good option - need to go to the ER for urgent GYN assesssment

## 2019-06-24 NOTE — Telephone Encounter (Signed)
Daughter advised.

## 2019-06-26 DIAGNOSIS — B373 Candidiasis of vulva and vagina: Secondary | ICD-10-CM | POA: Diagnosis not present

## 2019-06-26 DIAGNOSIS — E039 Hypothyroidism, unspecified: Secondary | ICD-10-CM | POA: Diagnosis not present

## 2019-06-26 DIAGNOSIS — K5900 Constipation, unspecified: Secondary | ICD-10-CM | POA: Diagnosis not present

## 2019-06-26 DIAGNOSIS — Z87891 Personal history of nicotine dependence: Secondary | ICD-10-CM | POA: Diagnosis not present

## 2019-06-26 DIAGNOSIS — N761 Subacute and chronic vaginitis: Secondary | ICD-10-CM | POA: Diagnosis not present

## 2019-06-26 DIAGNOSIS — L309 Dermatitis, unspecified: Secondary | ICD-10-CM | POA: Diagnosis not present

## 2019-06-26 DIAGNOSIS — I10 Essential (primary) hypertension: Secondary | ICD-10-CM | POA: Diagnosis not present

## 2019-06-26 DIAGNOSIS — K219 Gastro-esophageal reflux disease without esophagitis: Secondary | ICD-10-CM | POA: Diagnosis not present

## 2019-06-26 DIAGNOSIS — Z008 Encounter for other general examination: Secondary | ICD-10-CM | POA: Diagnosis not present

## 2019-06-26 DIAGNOSIS — Z881 Allergy status to other antibiotic agents status: Secondary | ICD-10-CM | POA: Diagnosis not present

## 2019-06-26 DIAGNOSIS — Z79891 Long term (current) use of opiate analgesic: Secondary | ICD-10-CM | POA: Diagnosis not present

## 2019-06-26 DIAGNOSIS — R69 Illness, unspecified: Secondary | ICD-10-CM | POA: Diagnosis not present

## 2019-07-03 DIAGNOSIS — N761 Subacute and chronic vaginitis: Secondary | ICD-10-CM | POA: Diagnosis not present

## 2019-07-31 DIAGNOSIS — N761 Subacute and chronic vaginitis: Secondary | ICD-10-CM | POA: Diagnosis not present

## 2019-10-13 ENCOUNTER — Other Ambulatory Visit: Payer: Self-pay | Admitting: Family Medicine

## 2019-10-16 ENCOUNTER — Encounter: Payer: Self-pay | Admitting: Family Medicine

## 2019-10-16 ENCOUNTER — Other Ambulatory Visit: Payer: Self-pay

## 2019-10-16 ENCOUNTER — Ambulatory Visit (INDEPENDENT_AMBULATORY_CARE_PROVIDER_SITE_OTHER): Payer: Medicare HMO | Admitting: Family Medicine

## 2019-10-16 VITALS — BP 126/56 | HR 64 | Temp 97.3°F | Ht 61.0 in

## 2019-10-16 DIAGNOSIS — G479 Sleep disorder, unspecified: Secondary | ICD-10-CM | POA: Diagnosis not present

## 2019-10-16 DIAGNOSIS — R5383 Other fatigue: Secondary | ICD-10-CM | POA: Diagnosis not present

## 2019-10-16 DIAGNOSIS — F418 Other specified anxiety disorders: Secondary | ICD-10-CM | POA: Diagnosis not present

## 2019-10-16 DIAGNOSIS — R5381 Other malaise: Secondary | ICD-10-CM

## 2019-10-16 DIAGNOSIS — E538 Deficiency of other specified B group vitamins: Secondary | ICD-10-CM

## 2019-10-16 DIAGNOSIS — E039 Hypothyroidism, unspecified: Secondary | ICD-10-CM | POA: Diagnosis not present

## 2019-10-16 DIAGNOSIS — I10 Essential (primary) hypertension: Secondary | ICD-10-CM

## 2019-10-16 DIAGNOSIS — D649 Anemia, unspecified: Secondary | ICD-10-CM

## 2019-10-16 DIAGNOSIS — R69 Illness, unspecified: Secondary | ICD-10-CM | POA: Diagnosis not present

## 2019-10-16 MED ORDER — CYANOCOBALAMIN 1000 MCG/ML IJ SOLN
1000.0000 ug | Freq: Once | INTRAMUSCULAR | Status: AC
  Administered 2019-10-16: 1000 ug via INTRAMUSCULAR

## 2019-10-16 MED ORDER — MIRTAZAPINE 15 MG PO TABS: 15.0000 mg | ORAL_TABLET | Freq: Every day | ORAL | 11 refills | Status: DC

## 2019-10-16 NOTE — Assessment & Plan Note (Signed)
Worsened by depression  Also gets up when husband has to get out of bed (very tall bed, has had falls) Stressed imp she gets a caregiver for both of them for night time Should consider separate beds  Trial of mirtazapine for depression /sleep and appetite

## 2019-10-16 NOTE — Assessment & Plan Note (Signed)
Suspect depression is much worse (also grief from loosing son)  She declines counseling Has stopped eating (also most fluids) and seems to have given up  Not suicidal however  Not sleeping and poor appetite Will try mirtazapine 15 mg daily (with close supervision for sedation and falls) -and update  Discussed expectations of this medication including time to effectiveness and mechanism of action, also poss of side effects (early and late)- including mental fuzziness, weight or appetite change, nausea and poss of worse dep or anxiety (even suicidal thoughts)  Pt voiced understanding and will stop med and update if this occurs

## 2019-10-16 NOTE — Assessment & Plan Note (Signed)
Lab today  Very poor nutrition

## 2019-10-16 NOTE — Assessment & Plan Note (Signed)
bp in fair control at this time  BP Readings from Last 1 Encounters:  10/16/19 (!) 126/56   No changes needed Most recent labs reviewed  Disc lifstyle change with low sodium diet and exercise

## 2019-10-16 NOTE — Progress Notes (Signed)
Subjective:    Patient ID: Dawn Aguirre, female    DOB: July 16, 1925, 86 y.o.   MRN: [id number]  This visit occurred during the SARS-CoV-2 public health emergency.  Safety protocols were in place, including screening questions prior to the visit, additional usage of staff PPE, and extensive cleaning of exam room while observing appropriate contact time as indicated for disinfecting solutions.    HPI Pt presents with sleeping issues (also not eating or drinking much  In wheelchair-cannot weigh today  GI issues- vomited on Saturday (? If dark but not blood)  No diarrhea  ? If dehydrated  No longer nauseated at all  Stomach is not hurting   Not sleeping well (neigher is husband-he gets up a lot) She has to get up to help   A lot of stress  Grief- lost her son (doing better than expected) -was able to accept his death    She takes hydrocodone occ for pain -does not take regularly   Trying to drink- not getting a lot in    She is seeing uro/gyn for desquamation of labia with vaginitis tx with hydrocortisone and abx   HTN BP Readings from Last 3 Encounters:  10/16/19 (!) 126/56  06/22/19 140/63  06/20/19 (!) 142/60   Hypothyroid Lab Results  Component Value Date   TSH 11.42 (H) 01/22/2019    She is taking her levothroxine regularly  Caregiver is helping   Lab Results  Component Value Date   VITAMINB12 130 (L) 01/22/2019   Not taking her pills    Patient Active Problem List   Diagnosis Date Noted  . Vaginal pain 06/20/2019  . Left foot pain 01/22/2019  . Epistaxis 12/27/2018  . Hypertension, essential 10/06/2017  . Right hand pain 06/05/2017  . Abrasion of left ring finger 06/05/2017  . Injury caused by dog bite, initial encounter 05/19/2017  . Tricuspid regurgitation 12/09/2016  . Mitral regurgitation 12/09/2016  . Normochromic anemia 12/09/2016  . Chronic abdominal pain 10/18/2016  . Urine frequency 09/09/2016  . Malaise and fatigue 09/09/2016  .  Sleep disorder 01/13/2016  . Female cystocele 12/30/2015  . Left shoulder pain 09/02/2015  . Neck pain 09/02/2015  . Routine general medical examination at a health care facility 07/22/2015  . Hearing loss 07/22/2015  . Prediabetes 07/22/2015  . Depression with anxiety 04/24/2015  . Tremor 12/20/2014  . Chronic pain 10/14/2014  . B12 deficiency 07/16/2014  . Vitamin D deficiency 07/16/2014  . Fatigue 07/04/2014  . Encounter for Medicare annual wellness exam 01/13/2014  . Fall in home 12/24/2013  . History of breast cancer 12/18/2013  . Epiploic appendagitis 02/22/2013  . Dry mouth 02/04/2013  . Benign paroxysmal positional vertigo 09/14/2012  . Low back pain 09/07/2012  . Constipation 11/04/2011  . Aortic stenosis 08/31/2010  . Hyperlipidemia   . STRESS REACTION, ACUTE, WITH EMOTIONAL DISTURBANCE 10/12/2007  . [place] DISEASE, CERVICAL 10/12/2007  . Hypothyroidism 01/22/2007  . RAYNAUD'S SYNDROME 01/22/2007  . EXTERNAL HEMORRHOIDS 01/22/2007  . DIVERTICULOSIS, COLON 01/22/2007  . FIBROCYSTIC BREAST DISEASE 01/22/2007  . Osteoporosis 01/22/2007  . GERD 12/16/2006  . DYSPNEA ON EXERTION 12/16/2006  . TOBACCO ABUSE, HX OF 12/16/2006  . HYSTERECTOMY, HX OF 12/16/2006   Past Medical History:  Diagnosis Date  . Anxiety   . Aortic stenosis    a. mild-mod by echo 2012.  . Cancer of left breast ([place])    surgery at [facility]  . Chronic airway obstruction, not elsewhere classified   . Colitis,  ischemic ([place]) 07/2000   ? infect  . Degeneration of cervical intervertebral disc   . Diffuse cystic mastopathy   . Diverticulosis of colon (without mention of hemorrhage)   . Esophageal reflux   . Essential hypertension   . External hemorrhoids without mention of complication   . Fibula fracture 2006  . Hyperlipidemia    a. intol of multiple statins.  . Lichen sclerosus    back/abd/vulvar  . Mastoiditis of right side 04/2005   admitted  . Mild mitral regurgitation 2012  . Moderate  tricuspid regurgitation 2012  . Osteoporosis, unspecified   . Personal history of tobacco use, presenting hazards to health   . RA (rheumatoid arthritis) ([facility])    Sero-negative  . Raynaud's syndrome   . Spinal stenosis   . Stress at home    "caring for son who is disabled" (12/08/2016)  . Unspecified gastritis and gastroduodenitis without mention of hemorrhage   . Unspecified hypothyroidism    Past Surgical History:  Procedure Laterality Date  . ABDOMINAL HYSTERECTOMY  1968  . BREAST BIOPSY Left   . BREAST LUMPECTOMY Left   . Cardiollite  02/2002   Neg  . CATARACT EXTRACTION W/ INTRAOCULAR LENS IMPLANT Right   . COLONOSCOPY    . COLONOSCOPY    . DEXA  2001   OP,heel  . DEXA     OP hip T-2.94  . NASAL SINUS SURGERY  10/2002   Archie Endo 06/23/2010  . Nuclear Stress Test  11/07   Normal  . SHOULDER ARTHROSCOPY W/ ROTATOR CUFF REPAIR Right 06/2005   Archie Endo 06/23/2010  . TONSILLECTOMY    . US ECHOCARDIOGRAPHY  10/06   normal LVF EF 55-65%   Social History   Tobacco Use  . Smoking status: Former Smoker    Packs/day: 2.00    Years: 10.00    Pack years: 20.00    Types: Cigarettes    Quit date: 02/07/1962    Years since quitting: 57.7  . Smokeless tobacco: Never Used  Vaping Use  . Vaping Use: Never used  Substance Use Topics  . Alcohol use: Yes    Alcohol/week: 0.0 standard drinks    Comment: 12/08/2016 "glass of wine a couple times/month"  . Drug use: No   Family History  Problem Relation Age of Onset  . Heart attack Father 75  . Coronary artery disease Sister 65       CABG  . Heart attack Brother 76  . Coronary artery disease Other        GF   Allergies  Allergen Reactions  . Ace Inhibitors     REACTION: cough  . Dm-Apap-Cpm Nausea Only  . Ezetimibe     REACTION: myalgia  . Ezetimibe-Simvastatin     REACTION: joints swell  . Macrobid [Nitrofurantoin]     ABD PAIN, ALSO BODY ACHES/PAIN  . Moxifloxacin   . Rosuvastatin     REACTION: myalgia  . Septra  [Sulfamethoxazole-Trimethoprim]     Sick feeling   . Simvastatin     REACTION: myalgia  . Triaminic    Current Outpatient Medications on File Prior to Visit  Medication Sig Dispense Refill  . busPIRone (BUSPAR) 30 MG tablet Take 1 tablet (30 mg total) by mouth 2 (two) times daily. 180 tablet 3  . clobetasol ointment (TEMOVATE) 0.27 % Apply 1 application topically daily. To affected vulvar areas daily at bedtime 30 g 0  . levothyroxine (SYNTHROID) 100 MCG tablet TAKE 1 TABLET BY MOUTH EVERY DAY 90  tablet 0  . olmesartan-hydrochlorothiazide (BENICAR HCT) 40-25 MG tablet TAKE 1 TABLET BY MOUTH EVERY DAY 90 tablet 0  . Polyethylene Glycol 3350 (MIRALAX PO) Take 17 g by mouth daily as needed.     . cyanocobalamin ([facility] VITAMIN B12) 2000 MCG tablet Take 2,000 mcg by mouth daily. (Patient not taking: Reported on 10/16/2019)     No current facility-administered medications on file prior to visit.    Review of Systems  Constitutional: Positive for activity change, appetite change and fatigue. Negative for fever and unexpected weight change.  HENT: Negative for congestion, ear pain, rhinorrhea, sinus pressure and sore throat.   Eyes: Negative for pain, redness and visual disturbance.  Respiratory: Negative for cough, shortness of breath and wheezing.   Cardiovascular: Negative for chest pain and palpitations.  Gastrointestinal: Positive for constipation and nausea. Negative for abdominal pain, blood in stool, diarrhea and vomiting.       Had nausea one day-better now  Baseline constipation   Endocrine: Negative for polydipsia, polyphagia and polyuria.  Genitourinary: Positive for frequency. Negative for dysuria and urgency.  Musculoskeletal: Positive for arthralgias, back pain and gait problem. Negative for joint swelling and myalgias.  Skin: Negative for pallor and rash.  Allergic/Immunologic: Negative for environmental allergies.  Neurological: Positive for weakness. Negative for dizziness,  syncope, light-headedness, numbness and headaches.  Hematological: Negative for adenopathy. Does not bruise/bleed easily.  Psychiatric/Behavioral: Positive for confusion, dysphoric mood and sleep disturbance. Negative for decreased concentration and suicidal ideas. The patient is nervous/anxious.        Objective:   Physical Exam Constitutional:      General: She is not in acute distress.    Appearance: She is well-developed.     Comments: Frail appearing elderly female in wheelchair    HENT:     Head: Normocephalic and atraumatic.     Mouth/Throat:     Comments: Not well hydrated Eyes:     General: No scleral icterus.    Conjunctiva/sclera: Conjunctivae normal.     Pupils: Pupils are equal, round, and reactive to light.  Neck:     Thyroid: No thyromegaly.     Vascular: No carotid bruit or JVD.  Cardiovascular:     Rate and Rhythm: Normal rate and regular rhythm.     Heart sounds: Murmur heard.  No gallop.   Pulmonary:     Effort: Pulmonary effort is normal. No respiratory distress.     Breath sounds: Normal breath sounds. No wheezing or rales.  Abdominal:     General: Bowel sounds are normal. There is no distension or abdominal bruit.     Palpations: Abdomen is soft. There is no mass.     Tenderness: There is no abdominal tenderness. There is no guarding or rebound.     Hernia: No hernia is present.  Musculoskeletal:     Cervical back: Normal range of motion and neck supple.     Right lower leg: No edema.     Left lower leg: No edema.     Comments: Poor rom of hips/knees /spine In wheelchair today  Lymphadenopathy:     Cervical: No cervical adenopathy.  Skin:    General: Skin is warm and dry.     Coloration: Skin is not jaundiced or pale.     Findings: No erythema or rash.  Neurological:     Mental Status: She is alert.     Motor: Weakness present.     Deep Tendon Reflexes: Reflexes are normal and symmetric.  Comments: Generalized weakness Unable to walk     Psychiatric:        Attention and Perception: She is attentive.        Mood and Affect: Mood is anxious and depressed. Affect is not tearful.        Speech: Speech is tangential.     Comments: Limited short term recall  Generally inattentive  Depressed and mildly anxious  Helpful caregiver present            Assessment & Plan:   Problem List Items Addressed This Visit      Cardiovascular and Mediastinum   Hypertension, essential    bp in fair control at this time  BP Readings from Last 1 Encounters:  10/16/19 (!) 126/56   No changes needed Most recent labs reviewed  Disc lifstyle change with low sodium diet and exercise        Relevant Orders   CBC with Differential/Platelet   Comprehensive metabolic panel     Endocrine   Hypothyroidism    Overdue for TSH Poor appetite  Lab today      Relevant Orders   TSH     Other   B12 deficiency    Low B12 -last level Pt is noncompliant with oral supplementation and will not eat  B12 shot today  Re check level-expect low  Strongly recommend she resume her oral B12       Relevant Orders   Vitamin B12   Depression with anxiety - Primary    Suspect depression is much worse (also grief from loosing son)  She declines counseling Has stopped eating (also most fluids) and seems to have given up  Not suicidal however  Not sleeping and poor appetite Will try mirtazapine 15 mg daily (with close supervision for sedation and falls) -and update  Discussed expectations of this medication including time to effectiveness and mechanism of action, also poss of side effects (early and late)- including mental fuzziness, weight or appetite change, nausea and poss of worse dep or anxiety (even suicidal thoughts)  Pt voiced understanding and will stop med and update if this occurs        Relevant Medications   mirtazapine (REMERON) 15 MG tablet   Sleep disorder    Worsened by depression  Also gets up when husband has to get out  of bed (very tall bed, has had falls) Stressed imp she gets a caregiver for both of them for night time Should consider separate beds  Trial of mirtazapine for depression /sleep and appetite      Malaise and fatigue    Multi factorial  Depression /anxiety and very poor po intake currently  Lab today  Stressed imp of making herself drink / adding meal supplements  She is not motivated  Also low B12 (shot given today)      Normochromic anemia    Lab today  Very poor nutrition

## 2019-10-16 NOTE — Patient Instructions (Addendum)
Please drink fluids-push as much as possible  You will have to make yourself  Milk is ok  Please try to get some water and clear fluids also  Ensure or boost for meal replacement   If you do not start eating and drinking - then you will end up in the hospital   No doubt depression plays into this  Please start mirtazapine 15 mg at bedtime  It helps depression and appetite and sleep     Please look into hiring someone to stay with Dawn Aguirre at night on the weekends  It would be wise to sleep in separate beds   Labs today for thyroid and kidney function/blood count   B12 shot today and you start back on B12 pills

## 2019-10-16 NOTE — Assessment & Plan Note (Signed)
Multi factorial  Depression /anxiety and very poor po intake currently  Lab today  Stressed imp of making herself drink / adding meal supplements  She is not motivated  Also low B12 (shot given today)

## 2019-10-16 NOTE — Assessment & Plan Note (Signed)
Overdue for TSH Poor appetite  Lab today

## 2019-10-16 NOTE — Assessment & Plan Note (Signed)
Low B12 -last level Pt is noncompliant with oral supplementation and will not eat  B12 shot today  Re check level-expect low  Strongly recommend she resume her oral B12

## 2019-10-17 ENCOUNTER — Telehealth: Payer: Self-pay

## 2019-10-17 DIAGNOSIS — D649 Anemia, unspecified: Secondary | ICD-10-CM | POA: Diagnosis not present

## 2019-10-17 LAB — COMPREHENSIVE METABOLIC PANEL
ALT: 10 U/L (ref 0–35)
AST: 13 U/L (ref 0–37)
Albumin: 3.1 g/dL — ABNORMAL LOW (ref 3.5–5.2)
Alkaline Phosphatase: 82 U/L (ref 39–117)
BUN: 28 mg/dL — ABNORMAL HIGH (ref 6–23)
CO2: 24 mEq/L (ref 19–32)
Calcium: 8.6 mg/dL (ref 8.4–10.5)
Chloride: 100 mEq/L (ref 96–112)
Creatinine, Ser: 1.31 mg/dL — ABNORMAL HIGH (ref 0.40–1.20)
GFR: 37.93 mL/min — ABNORMAL LOW (ref >60.00)
Glucose, Bld: 145 mg/dL — ABNORMAL HIGH (ref 70–99)
Potassium: 4.5 mEq/L (ref 3.5–5.1)
Sodium: 135 mEq/L (ref 135–145)
Total Bilirubin: 0.4 mg/dL (ref 0.2–1.2)
Total Protein: 6.6 g/dL (ref 6.0–8.3)

## 2019-10-17 LAB — CBC WITH DIFFERENTIAL/PLATELET
Basophils Absolute: 0.1 10*3/uL (ref 0.0–0.1)
Basophils Relative: 1.5 % (ref 0.0–3.0)
Eosinophils Absolute: 0 10*3/uL (ref 0.0–0.7)
Eosinophils Relative: 0.7 % (ref 0.0–5.0)
HCT: 25.2 % — ABNORMAL LOW (ref 36.0–46.0)
Hemoglobin: 7.8 g/dL — CL (ref 12.0–15.0)
Lymphocytes Relative: 13 % (ref 12.0–46.0)
Lymphs Abs: 0.8 10*3/uL (ref 0.7–4.0)
MCHC: 30.8 g/dL (ref 30.0–36.0)
MCV: 69.3 fl — ABNORMAL LOW (ref 78.0–100.0)
Monocytes Absolute: 0.8 10*3/uL (ref 0.1–1.0)
Monocytes Relative: 13.2 % — ABNORMAL HIGH (ref 3.0–12.0)
Neutro Abs: 4.2 10*3/uL (ref 1.4–7.7)
Neutrophils Relative %: 71.6 % (ref 43.0–77.0)
Platelets: 325 10*3/uL (ref 150.0–400.0)
RBC: 3.64 Mil/uL — ABNORMAL LOW (ref 3.87–5.11)
RDW: 18 % — ABNORMAL HIGH (ref 11.5–15.5)
WBC: 5.9 10*3/uL (ref 4.0–10.5)

## 2019-10-17 LAB — VITAMIN B12: Vitamin B-12: 200 pg/mL — ABNORMAL LOW (ref 211–911)

## 2019-10-17 LAB — TSH: TSH: 9.29 u[IU]/mL — ABNORMAL HIGH (ref 0.35–4.50)

## 2019-10-17 NOTE — Telephone Encounter (Signed)
[facility] called critical results @ 1038  Hemoglobin 7.8

## 2019-10-17 NOTE — Addendum Note (Signed)
Addended by: Cloyd Stagers on: 10/17/2019 01:27 PM   Modules accepted: Orders

## 2019-10-18 ENCOUNTER — Telehealth: Payer: Self-pay | Admitting: *Deleted

## 2019-10-18 DIAGNOSIS — K5901 Slow transit constipation: Secondary | ICD-10-CM

## 2019-10-18 DIAGNOSIS — D509 Iron deficiency anemia, unspecified: Secondary | ICD-10-CM

## 2019-10-18 LAB — PATHOLOGIST SMEAR REVIEW

## 2019-10-18 NOTE — Telephone Encounter (Signed)
Left VM requesting pt to call the office back regarding lab results  

## 2019-10-24 ENCOUNTER — Encounter: Payer: Self-pay | Admitting: Family Medicine

## 2019-10-24 DIAGNOSIS — D509 Iron deficiency anemia, unspecified: Secondary | ICD-10-CM | POA: Insufficient documentation

## 2019-10-24 NOTE — Telephone Encounter (Signed)
Path review is consistent with iron def anemia  Please ask if she has taken iron in the past orally and if she tolerates it (if not may need iron infusion or transfusion) We need to know also if she is loosing blood from GI system I want her to do 3 heme cards (not ifob but hemoccult) to see if there is blood in stool  May need to see GI as well  Please inform pt's daughter as well if possible since her memory is not good right now

## 2019-10-24 NOTE — Telephone Encounter (Signed)
See last 2 notes Dr. Marliss Coots comments on labs. Spoke with daughter and advise her of Dr. Marliss Coots comments and options. She said she did see labs on mychart and ask pt about it. Pt stated the usual that she "will do better" but nothing is getting better. She feels pt isn't eating still but she's not sure about the vomiting. Pt's daughter said she would relay all options to pt and have her f/u with [place]

## 2019-10-24 NOTE — Telephone Encounter (Signed)
Pt returning your call  Best number [phone]

## 2019-10-24 NOTE — Telephone Encounter (Signed)
Very low hemoglobin -this is concerning  I have ordered a smear review to confirm if the pattern looks like iron deficiency or something else =waiting on that  Poor nutrition and kidney insufficiency may have roles in this  Please let me know how GI symptoms are (had vomiting last weekend)  Has she been able to eat or drink anything ?

## 2019-10-24 NOTE — Telephone Encounter (Signed)
Daughter gave me pt's home # of [phone], called pt since I missed a call and advise pt of Dr. Marliss Coots comments and went over labs with her. Pt said she doesn't think she is up for doing stool cards so pt declined that. Pt said that she has been severely constipated for a while and today was the 1st normal BM she has done in a while so pt not sure if she could stomach iron pills. When asked about iron infusion or transfusion pt said she feels so bad she will "do whatever Dr. Glori Bickers tells her to do".   I asked about GI sxs and she said that she hasn't vomited anymore but she still doesn't eat. Today she has only at a "sweet roll" but she is trying to drink more. Pt said she drank 2 cups of coffee, and a cup of hot chocolate. Pt said if Dr. Glori Bickers thinks she needs to see a GI again she will do whatever Dr. Glori Bickers recommends.

## 2019-10-24 NOTE — Telephone Encounter (Signed)
I placed the GI referral  I am worried about her  Please encourage fluids for kidney health  I pended a new levothyroxine px for 112 mcg If she is reliably taking her thyroid medicine then we need to up the dose If not then cancel that   I need to see her back for a visit with her daughter when they can come She will need another B12 shot  I want to check in re: her overall health and goals at this point

## 2019-10-25 NOTE — Telephone Encounter (Signed)
Left message to return call to our office.  

## 2019-10-29 NOTE — Telephone Encounter (Signed)
See Daughter's mychart message

## 2019-11-05 ENCOUNTER — Other Ambulatory Visit: Payer: Self-pay | Admitting: Family Medicine

## 2019-11-06 ENCOUNTER — Other Ambulatory Visit: Payer: Self-pay

## 2019-11-06 ENCOUNTER — Emergency Department (HOSPITAL_COMMUNITY): Payer: Medicare HMO

## 2019-11-06 ENCOUNTER — Observation Stay (HOSPITAL_COMMUNITY)
Admission: EM | Admit: 2019-11-06 | Discharge: 2019-11-08 | Disposition: A | Discharge status: Home / Self Care | Payer: Medicare HMO | Attending: Internal Medicine | Admitting: Internal Medicine

## 2019-11-06 DIAGNOSIS — D638 Anemia in other chronic diseases classified elsewhere: Secondary | ICD-10-CM | POA: Diagnosis present

## 2019-11-06 DIAGNOSIS — F0390 Unspecified dementia without behavioral disturbance: Secondary | ICD-10-CM | POA: Insufficient documentation

## 2019-11-06 DIAGNOSIS — R531 Weakness: Secondary | ICD-10-CM | POA: Diagnosis not present

## 2019-11-06 DIAGNOSIS — I517 Cardiomegaly: Secondary | ICD-10-CM | POA: Diagnosis not present

## 2019-11-06 DIAGNOSIS — N1831 Chronic kidney disease, stage 3a: Secondary | ICD-10-CM | POA: Insufficient documentation

## 2019-11-06 DIAGNOSIS — D509 Iron deficiency anemia, unspecified: Secondary | ICD-10-CM | POA: Diagnosis present

## 2019-11-06 DIAGNOSIS — E039 Hypothyroidism, unspecified: Secondary | ICD-10-CM | POA: Insufficient documentation

## 2019-11-06 DIAGNOSIS — N179 Acute kidney failure, unspecified: Secondary | ICD-10-CM | POA: Insufficient documentation

## 2019-11-06 DIAGNOSIS — I129 Hypertensive chronic kidney disease with stage 1 through stage 4 chronic kidney disease, or unspecified chronic kidney disease: Secondary | ICD-10-CM | POA: Diagnosis not present

## 2019-11-06 DIAGNOSIS — M6281 Muscle weakness (generalized): Secondary | ICD-10-CM | POA: Diagnosis not present

## 2019-11-06 DIAGNOSIS — J811 Chronic pulmonary edema: Secondary | ICD-10-CM | POA: Diagnosis not present

## 2019-11-06 DIAGNOSIS — R5381 Other malaise: Secondary | ICD-10-CM | POA: Diagnosis not present

## 2019-11-06 DIAGNOSIS — Z87891 Personal history of nicotine dependence: Secondary | ICD-10-CM | POA: Diagnosis not present

## 2019-11-06 DIAGNOSIS — Z853 Personal history of malignant neoplasm of breast: Secondary | ICD-10-CM | POA: Diagnosis not present

## 2019-11-06 DIAGNOSIS — R2681 Unsteadiness on feet: Secondary | ICD-10-CM | POA: Insufficient documentation

## 2019-11-06 DIAGNOSIS — Z20822 Contact with and (suspected) exposure to covid-19: Secondary | ICD-10-CM | POA: Diagnosis not present

## 2019-11-06 DIAGNOSIS — E538 Deficiency of other specified B group vitamins: Secondary | ICD-10-CM | POA: Diagnosis present

## 2019-11-06 DIAGNOSIS — D649 Anemia, unspecified: Secondary | ICD-10-CM | POA: Diagnosis not present

## 2019-11-06 DIAGNOSIS — Z9181 History of falling: Secondary | ICD-10-CM | POA: Insufficient documentation

## 2019-11-06 DIAGNOSIS — R69 Illness, unspecified: Secondary | ICD-10-CM | POA: Diagnosis not present

## 2019-11-06 DIAGNOSIS — R0902 Hypoxemia: Secondary | ICD-10-CM | POA: Diagnosis not present

## 2019-11-06 DIAGNOSIS — I959 Hypotension, unspecified: Secondary | ICD-10-CM | POA: Diagnosis not present

## 2019-11-06 DIAGNOSIS — K5730 Diverticulosis of large intestine without perforation or abscess without bleeding: Secondary | ICD-10-CM | POA: Diagnosis present

## 2019-11-06 DIAGNOSIS — I10 Essential (primary) hypertension: Secondary | ICD-10-CM | POA: Diagnosis not present

## 2019-11-06 LAB — PREPARE RBC (CROSSMATCH)

## 2019-11-06 LAB — COMPREHENSIVE METABOLIC PANEL
ALT: 31 U/L (ref 0–44)
AST: 28 U/L (ref 15–41)
Albumin: 2.1 g/dL — ABNORMAL LOW (ref 3.5–5.0)
Alkaline Phosphatase: 116 U/L (ref 38–126)
Anion gap: 13 (ref 5–15)
BUN: 70 mg/dL — ABNORMAL HIGH (ref 8–23)
CO2: 19 mmol/L — ABNORMAL LOW (ref 22–32)
Calcium: 8.7 mg/dL — ABNORMAL LOW (ref 8.9–10.3)
Chloride: 102 mmol/L (ref 98–111)
Creatinine, Ser: 2.36 mg/dL — ABNORMAL HIGH (ref 0.44–1.00)
GFR calc Af Amer: 20 mL/min — ABNORMAL LOW (ref >60)
GFR calc non Af Amer: 17 mL/min — ABNORMAL LOW (ref >60)
Glucose, Bld: 138 mg/dL — ABNORMAL HIGH (ref 70–99)
Potassium: 5.5 mmol/L — ABNORMAL HIGH (ref 3.5–5.1)
Sodium: 134 mmol/L — ABNORMAL LOW (ref 135–145)
Total Bilirubin: 0.6 mg/dL (ref 0.3–1.2)
Total Protein: 6.7 g/dL (ref 6.5–8.1)

## 2019-11-06 LAB — RESPIRATORY PANEL BY RT PCR (FLU A&B, COVID)
Influenza A by PCR: NEGATIVE
Influenza B by PCR: NEGATIVE
SARS Coronavirus 2 by RT PCR: NEGATIVE

## 2019-11-06 LAB — IRON AND TIBC
Iron: 9 ug/dL — ABNORMAL LOW (ref 28–170)
Saturation Ratios: 5 % — ABNORMAL LOW (ref 10.4–31.8)
TIBC: 169 ug/dL — ABNORMAL LOW (ref 250–450)
UIBC: 160 ug/dL

## 2019-11-06 LAB — CBC WITH DIFFERENTIAL/PLATELET
Abs Immature Granulocytes: 0.05 10*3/uL (ref 0.00–0.07)
Basophils Absolute: 0 10*3/uL (ref 0.0–0.1)
Basophils Relative: 1 %
Eosinophils Absolute: 0.1 10*3/uL (ref 0.0–0.5)
Eosinophils Relative: 2 %
HCT: 25.2 % — ABNORMAL LOW (ref 36.0–46.0)
Hemoglobin: 7 g/dL — ABNORMAL LOW (ref 12.0–15.0)
Immature Granulocytes: 1 %
Lymphocytes Relative: 20 %
Lymphs Abs: 1.3 10*3/uL (ref 0.7–4.0)
MCH: 20.5 pg — ABNORMAL LOW (ref 26.0–34.0)
MCHC: 27.8 g/dL — ABNORMAL LOW (ref 30.0–36.0)
MCV: 73.9 fL — ABNORMAL LOW (ref 80.0–100.0)
Monocytes Absolute: 0.9 10*3/uL (ref 0.1–1.0)
Monocytes Relative: 14 %
Neutro Abs: 4.1 10*3/uL (ref 1.7–7.7)
Neutrophils Relative %: 62 %
Platelets: UNDETERMINED 10*3/uL (ref 150–400)
RBC: 3.41 MIL/uL — ABNORMAL LOW (ref 3.87–5.11)
RDW: 18.6 % — ABNORMAL HIGH (ref 11.5–15.5)
WBC: 6.4 10*3/uL (ref 4.0–10.5)
nRBC: 0 % (ref 0.0–0.2)

## 2019-11-06 LAB — RETICULOCYTES
Immature Retic Fract: 15.3 % (ref 2.3–15.9)
RBC.: 3.1 MIL/uL — ABNORMAL LOW (ref 3.87–5.11)
Retic Count, Absolute: 17 10*3/uL — ABNORMAL LOW (ref 19.0–186.0)
Retic Ct Pct: 0.6 % (ref 0.4–3.1)

## 2019-11-06 LAB — URINALYSIS, ROUTINE W REFLEX MICROSCOPIC
Bilirubin Urine: NEGATIVE
Glucose, UA: NEGATIVE mg/dL
Hgb urine dipstick: NEGATIVE
Ketones, ur: NEGATIVE mg/dL
Leukocytes,Ua: NEGATIVE
Nitrite: NEGATIVE
Protein, ur: NEGATIVE mg/dL
Specific Gravity, Urine: 1.012 (ref 1.005–1.030)
pH: 7 (ref 5.0–8.0)

## 2019-11-06 LAB — LACTIC ACID, PLASMA: Lactic Acid, Venous: 1.4 mmol/L (ref 0.5–1.9)

## 2019-11-06 LAB — FOLATE: Folate: 7.5 ng/mL (ref >5.9)

## 2019-11-06 LAB — ABO/RH: ABO/RH(D): O POS

## 2019-11-06 LAB — TSH: TSH: 0.316 u[IU]/mL — ABNORMAL LOW (ref 0.350–4.500)

## 2019-11-06 LAB — POC OCCULT BLOOD, ED: Fecal Occult Bld: NEGATIVE

## 2019-11-06 LAB — FERRITIN: Ferritin: 620 ng/mL — ABNORMAL HIGH (ref 11–307)

## 2019-11-06 LAB — VITAMIN B12: Vitamin B-12: 302 pg/mL (ref 180–914)

## 2019-11-06 MED ORDER — SODIUM CHLORIDE 0.9 % IV BOLUS
500.0000 mL | Freq: Once | INTRAVENOUS | Status: AC
  Administered 2019-11-06: 500 mL via INTRAVENOUS

## 2019-11-06 MED ORDER — ADULT MULTIVITAMIN W/MINERALS CH
1.0000 | ORAL_TABLET | Freq: Every day | ORAL | Status: DC
  Administered 2019-11-06 – 2019-11-08 (×2): 1 via ORAL
  Filled 2019-11-06 (×4): qty 1

## 2019-11-06 MED ORDER — SENNA 8.6 MG PO TABS
1.0000 | ORAL_TABLET | Freq: Two times a day (BID) | ORAL | Status: DC
  Administered 2019-11-08: 8.6 mg via ORAL
  Filled 2019-11-06 (×3): qty 1

## 2019-11-06 MED ORDER — SODIUM CHLORIDE 0.9 % IV SOLN: 10.0000 mL/h | Freq: Once | INTRAVENOUS | Status: DC

## 2019-11-06 MED ORDER — PANTOPRAZOLE SODIUM 40 MG IV SOLR
40.0000 mg | Freq: Two times a day (BID) | INTRAVENOUS | Status: DC
  Administered 2019-11-06 – 2019-11-08 (×4): 40 mg via INTRAVENOUS
  Filled 2019-11-06 (×5): qty 40

## 2019-11-06 MED ORDER — LEVOTHYROXINE SODIUM 100 MCG PO TABS
100.0000 ug | ORAL_TABLET | Freq: Every day | ORAL | Status: DC
  Administered 2019-11-07 – 2019-11-08 (×2): 100 ug via ORAL
  Filled 2019-11-06 (×2): qty 1

## 2019-11-06 MED ORDER — SORBITOL 70 % SOLN
30.0000 mL | Freq: Every day | Status: DC | PRN
  Filled 2019-11-06: qty 30

## 2019-11-06 MED ORDER — ONDANSETRON HCL 4 MG/2ML IJ SOLN: 4.0000 mg | Freq: Four times a day (QID) | INTRAMUSCULAR | Status: DC | PRN

## 2019-11-06 MED ORDER — MAGNESIUM HYDROXIDE 400 MG/5ML PO SUSP: 30.0000 mL | Freq: Every day | ORAL | Status: DC | PRN

## 2019-11-06 MED ORDER — SODIUM CHLORIDE 0.9 % IV SOLN: INTRAVENOUS | Status: DC

## 2019-11-06 MED ORDER — LEVOTHYROXINE SODIUM 100 MCG PO TABS
100.0000 ug | ORAL_TABLET | Freq: Every day | ORAL | Status: DC
  Administered 2019-11-06: 100 ug via ORAL
  Filled 2019-11-06: qty 1

## 2019-11-06 MED ORDER — SODIUM CHLORIDE 0.9% FLUSH
3.0000 mL | Freq: Two times a day (BID) | INTRAVENOUS | Status: DC
  Administered 2019-11-06 – 2019-11-08 (×2): 3 mL via INTRAVENOUS

## 2019-11-06 MED ORDER — HYDRALAZINE HCL 20 MG/ML IJ SOLN: 10.0000 mg | Freq: Four times a day (QID) | INTRAMUSCULAR | Status: DC | PRN

## 2019-11-06 MED ORDER — ONDANSETRON HCL 4 MG PO TABS: 4.0000 mg | ORAL_TABLET | Freq: Four times a day (QID) | ORAL | Status: DC | PRN

## 2019-11-06 MED ORDER — HEPARIN SODIUM (PORCINE) 5000 UNIT/ML IJ SOLN
5000.0000 [IU] | Freq: Three times a day (TID) | INTRAMUSCULAR | Status: DC
  Administered 2019-11-06: 5000 [IU] via SUBCUTANEOUS
  Filled 2019-11-06: qty 1

## 2019-11-06 NOTE — H&P (Signed)
[facility] History and Physical  Dawn Aguirre PFX:[id number] DOB: 1925-05-02 DOA: 11/06/2019 PCP: Dawn Greenspan, MD  Admitted from: Home Chief Complaint: Progressive generalized weakness  History of Present Illness: Dawn Aguirre is a 86 y.o. female with PMH significant for HTN, HLD, left breast cancer, diverticulosis, GERD. History obtained from patient, also helped by her son Mr. Dawn Aguirre at bedside and daughter Ms. Dawn Aguirre on the phone.   Patient lives at home, with her husband.  She is otherwise functional/does not need assistance to get around.  One of her sons had Down syndrome and died in 2019-07-29 under hospice care.  Family reports some degree of grief/depression since then.  They have also noticed onset of dementia in the interval.  For several months, patient complained of intermittent 'stomach' pain but never had evaluation.  Never had blood in stool or black stool or vomiting of blood.  Not any blood thinner. 3 weeks ago, patient was seen by her PCP and found to have low hemoglobin of 7.5.  For the duration patient is also having progressively worsening oral intake, generalized weakness, mostly bedbound status.  Patient has an appointment with GI later this week but because of her worsening symptoms, she was brought to the ER.  In the ED, patient is afebrile, heart rate in 70s mostly, blood pressure 101/49, breathing comfortably on room air. Labs with WBC 6.4, hemoglobin 7, MCV 74, sodium 134, potassium 5.5, BUN/creatinine 70/2.36, serum bicarb level low at 19, albumin low at 2.1. Chest x-ray normal.  Hospitalist service consulted for inpatient admission and management.  At the time of my evaluation, patient is alert, awake, oriented x3, able to answer questions, son at bedside. Family denies any fever, chest pain, shortness of breath.  She has had nausea but no vomiting, diarrhea.  For last 3 weeks, she has been less active and prefers to sleep.  Review of Systems:    All systems were reviewed and were negative unless otherwise mentioned in the HPI   Past medical history: Past Medical History:  Diagnosis Date  . Anxiety   . Aortic stenosis    a. mild-mod by echo 2012.  . Cancer of left breast ([place])    surgery at [facility]  . Chronic airway obstruction, not elsewhere classified   . Colitis, ischemic ([place]) 07/2000   ? infect  . Degeneration of cervical intervertebral disc   . Diffuse cystic mastopathy   . Diverticulosis of colon (without mention of hemorrhage)   . Esophageal reflux   . Essential hypertension   . External hemorrhoids without mention of complication   . Fibula fracture 2006  . Hyperlipidemia    a. intol of multiple statins.  . Lichen sclerosus    back/abd/vulvar  . Mastoiditis of right side 04/2005   admitted  . Mild mitral regurgitation 2012  . Moderate tricuspid regurgitation 2012  . Osteoporosis, unspecified   . Personal history of tobacco use, presenting hazards to health   . RA (rheumatoid arthritis) ([facility])    Sero-negative  . Raynaud's syndrome   . Spinal stenosis   . Stress at home    "caring for son who is disabled" (12/08/2016)  . Unspecified gastritis and gastroduodenitis without mention of hemorrhage   . Unspecified hypothyroidism     Past surgical history: Past Surgical History:  Procedure Laterality Date  . ABDOMINAL HYSTERECTOMY  1968  . BREAST BIOPSY Left   . BREAST LUMPECTOMY Left   . Cardiollite  02/2002   Neg  .  CATARACT EXTRACTION W/ INTRAOCULAR LENS IMPLANT Right   . COLONOSCOPY    . COLONOSCOPY    . DEXA  2001   OP,heel  . DEXA     OP hip T-2.94  . NASAL SINUS SURGERY  10/2002   Archie Endo 06/23/2010  . Nuclear Stress Test  11/07   Normal  . SHOULDER ARTHROSCOPY W/ ROTATOR CUFF REPAIR Right 06/2005   Archie Endo 06/23/2010  . TONSILLECTOMY    . US ECHOCARDIOGRAPHY  10/06   normal LVF EF 55-65%    Social History:  reports that she quit smoking about 57 years ago. Her smoking use included  cigarettes. She has a 20.00 pack-year smoking history. She has never used smokeless tobacco. She reports current alcohol use. She reports that she does not use drugs.  Allergies:  Allergies  Allergen Reactions  . Ace Inhibitors     REACTION: cough  . Dm-Apap-Cpm Nausea Only  . Ezetimibe     REACTION: myalgia  . Ezetimibe-Simvastatin     REACTION: joints swell  . Macrobid [Nitrofurantoin]     ABD PAIN, ALSO BODY ACHES/PAIN  . Moxifloxacin   . Rosuvastatin     REACTION: myalgia  . Septra [Sulfamethoxazole-Trimethoprim]     Sick feeling   . Simvastatin     REACTION: myalgia  . Triaminic     Family history:  Family History  Problem Relation Age of Onset  . Heart attack Father 72  . Coronary artery disease Sister 93       CABG  . Heart attack Brother 28  . Coronary artery disease Other        GF     Home Meds: Prior to Admission medications   Medication Sig Start Date End Date Taking? Authorizing Provider  busPIRone (BUSPAR) 30 MG tablet TAKE 1 TABLET (30 MG TOTAL) BY MOUTH 2 (TWO) TIMES DAILY. 11/05/19   Tower, Wynelle Fanny, MD  clobetasol ointment (TEMOVATE) 3.50 % Apply 1 application topically daily. To affected vulvar areas daily at bedtime 06/20/19   Tower, [place] [employer] [place] A, MD  cyanocobalamin ([employer] VITAMIN B12) 2000 MCG tablet Take 2,000 mcg by mouth daily. Patient not taking: Reported on 10/16/2019    [provider]  levothyroxine (SYNTHROID) 100 MCG tablet TAKE 1 TABLET BY MOUTH EVERY DAY 12/10/18   Tower, Wynelle Fanny, MD  mirtazapine (REMERON) 15 MG tablet Take 1 tablet (15 mg total) by mouth at bedtime. 10/16/19   Tower, Wynelle Fanny, MD  olmesartan-hydrochlorothiazide (BENICAR HCT) 40-25 MG tablet TAKE 1 TABLET BY MOUTH EVERY DAY 10/15/19   Tower, Wynelle Fanny, MD  Polyethylene Glycol 3350 (MIRALAX PO) Take 17 g by mouth daily as needed.     [provider]    Physical Exam: Vitals:   11/06/19 1445 11/06/19 1500 11/06/19 1545 11/06/19 1615  BP: (!) 89/43 (!) 87/45 (!) 98/55 (!)  99/49  Pulse: 75 77 80 77  Resp: (!) 25 (!) 23 19 (!) 27  Temp:      TempSrc:      SpO2: 100% 100% 100% 100%   Wt Readings from Last 3 Encounters:  06/22/19 61.9 kg  04/25/19 61.9 kg  01/22/19 68.2 kg   There is no height or weight on file to calculate BMI.  General exam: Appears calm and comfortable.  Looks weak.  Not in physical distress Skin: No rashes, lesions or ulcers. HEENT: Atraumatic, normocephalic, supple neck, no obvious bleeding Lungs: Clear to auscultate bilaterally CVS: Regular rate and rhythm, mild systolic ejection murmur, GI/Abd soft,  mild tenderness in epigastrium, bowel sound present CNS: Alert, awake, oriented to place and person Psychiatry: Depressed look Extremities: No pedal edema, no calf tenderness     Consult Orders  (From admission, onward)         Start     Ordered   11/06/19 1541  Consult to hospitalist  ALL PATIENTS BEING ADMITTED/HAVING PROCEDURES NEED COVID-19 SCREENING  Once       Comments: ALL PATIENTS BEING ADMITTED/HAVING PROCEDURES NEED COVID-19 SCREENING  Provider:  (Not yet assigned)  Question Answer Comment  Place call to: [facility]   Reason for Consult Admit      11/06/19 1540          Labs on Admission:   CBC: Recent Labs  Lab 11/06/19 1328  WBC 6.4  NEUTROABS 4.1  HGB 7.0*  HCT 25.2*  MCV 73.9*  PLT PLATELET CLUMPS NOTED ON SMEAR, UNABLE TO ESTIMATE    Basic Metabolic Panel: Recent Labs  Lab 11/06/19 1328  NA 134*  K 5.5*  CL 102  CO2 19*  GLUCOSE 138*  BUN 70*  CREATININE 2.36*  CALCIUM 8.7*    Liver Function Tests: Recent Labs  Lab 11/06/19 1328  AST 28  ALT 31  ALKPHOS 116  BILITOT 0.6  PROT 6.7  ALBUMIN 2.1*   No results for input(s): LIPASE, AMYLASE in the last 168 hours. No results for input(s): AMMONIA in the last 168 hours.  Cardiac Enzymes: No results for input(s): CKTOTAL, CKMB, CKMBINDEX, TROPONINI in the last 168 hours.  BNP (last 3 results) No results for  input(s): BNP in the last 8760 hours.  ProBNP (last 3 results) No results for input(s): PROBNP in the last 8760 hours.  CBG: No results for input(s): GLUCAP in the last 168 hours.  Lipase     Component Value Date/Time   LIPASE 21 06/22/2019 1618     Urinalysis    Component Value Date/Time   COLORURINE STRAW (A) 06/22/2019 2115   APPEARANCEUR CLEAR 06/22/2019 2115   LABSPEC 1.016 06/22/2019 2115   PHURINE 6.0 06/22/2019 2115   GLUCOSEU NEGATIVE 06/22/2019 2115   HGBUR SMALL (A) 06/22/2019 2115   BILIRUBINUR NEGATIVE 06/22/2019 2115   BILIRUBINUR neg 05/30/2018 1454   KETONESUR NEGATIVE 06/22/2019 2115   PROTEINUR NEGATIVE 06/22/2019 2115   UROBILINOGEN 0.2 05/30/2018 1454   UROBILINOGEN 0.2 02/17/2013 1243   NITRITE NEGATIVE 06/22/2019 2115   LEUKOCYTESUR SMALL (A) 06/22/2019 2115     Drugs of Abuse  No results found for: LABOPIA, COCAINSCRNUR, LABBENZ, AMPHETMU, THCU, LABBARB    Radiological Exams on Admission: DG Chest Portable 1 View  Result Date: 11/06/2019 CLINICAL DATA:  Weakness, anemia EXAM: PORTABLE CHEST 1 VIEW COMPARISON:  Prior chest x-ray 02/19/2019 FINDINGS: Stable cardiomegaly. No significant vascular congestion or evidence of pulmonary edema. Mediastinal contours are stable. Atherosclerotic calcifications present in the transverse aorta. Stable mild bronchitic changes. No focal airspace consolidation, pleural effusion or pneumothorax. Surgical changes of prior left breast biopsy again noted. IMPRESSION: 1. No acute cardiopulmonary process. 2. Stable chronic bronchitic changes and mild cardiomegaly. 3. Aortic atherosclerosis. Electronically Signed   By: Jacqulynn Cadet M.D.   On: 11/06/2019 13:52    ------------------------------------------------------------------------------------------------------ Assessment/Plan: Active Problems:   Anemia  Acute microcytic anemia -No report of hematochezia, hematemesis or melena but chronic 'stomach' pain. -suspect  chronic GI bleeding. -May also need to rule out malignancy based on her age. -GI consult with [facility] called.  N.p.o. after midnight for possible intervention tomorrow -IV Protonix -  Trend hemoglobin.  Anemia panel sent. Recent Labs    11/28/18 1135 01/22/19 1214 06/22/19 1618 06/22/19 1618 10/16/19 1516 11/06/19 1328 11/06/19 1510  HGB  --   --  8.2*  --  7.8 Repeated and verified X2.* 7.0*  --   MCV  --   --  78.7*   < > 69.3 Repeated and verified X2.* 73.9*  --   VITAMINB12 197* 130*  --   --  200*  --   --   RETICCTPCT  --   --   --   --   --   --  0.6   < > = values in this interval not displayed.    AKI on CKD 3a -Baseline creatinine 1.09 in May, it was 1.31 3 weeks ago. -Presented with creatinine of 2.36.  Secondary to poor oral intake. -Expect improvement with hydration. -Normal saline at 100 mill per hour overnight. Recent Labs    11/28/18 1135 01/22/19 1214 06/22/19 1618 10/16/19 1516 11/06/19 1328  BUN 19 20 24* 28* 70*  CREATININE 1.21* 1.08 1.09* 1.31* 2.36*   Hypotension History of essential hypertension -Blood pressure running low in 90s mostly. -Likely because of dehydration from poor oral intake. -No compelling evidence of infection/sepsis at this time.  However obtain lactic acid level and blood culture.  -Monitor off antibiotics for now..   -Monitor on IV fluids -On olmesartan/HCTZ 40 mg / 25 mg daily at home.  Keep it on hold.  Hyperkalemia -Likely secondary to renal failure. -Monitor. Recent Labs  Lab 11/06/19 1328  K 5.5*   Chronic Vitamin B12 deficiency -Continue vitamin B12 supplement  Hypothyroidism -Continue Synthroid.  Obtain TSH level.  Generalized weakness  -PT eval.  Depression -Hold buspirone and Remeron  Mobility: Encourage ambulation, PT eval Code Status:   Code Status: Full Code confirmed with patient and family DVT prophylaxis:  SCD boots.  Avoid heparin because of possible active bleeding Antimicrobials:   None Fluid: Normal saline at 100 mL/h  Diet:  Diet Order            Diet NPO time specified  Diet effective midnight           Diet regular Room service appropriate? Yes; Fluid consistency: Thin  Diet effective now                Consultants: GI Family Communication:  Family at bedside and on phone  Dispo: The patient is from: Home              Anticipated d/c is to: Home              Anticipated d/c date is: 3 days  ------------------------------------------------------------------------------------- Severity of Illness: The appropriate patient status for this patient is INPATIENT. Inpatient status is judged to be reasonable and necessary in order to provide the required intensity of service to ensure the patient's safety. The patient's presenting symptoms, physical exam findings, and initial radiographic and laboratory data in the context of their chronic comorbidities is felt to place them at high risk for further clinical deterioration. Furthermore, it is not anticipated that the patient will be medically stable for discharge from the hospital within 2 midnights of admission. The following factors support the patient status of inpatient.   " The patient's presenting symptoms include weakness, poor oral intake. " The worrisome physical exam findings include dehydration. " The initial radiographic and laboratory data are worrisome because of anemia, AKI. " The chronic co-morbidities include advanced age.   *  I certify that at the point of admission it is my clinical judgment that the patient will require inpatient hospital care spanning beyond 2 midnights from the point of admission due to high intensity of service, high risk for further deterioration and high frequency of surveillance required.*  Signed, Terrilee Croak, MD [facility] 11/06/2019

## 2019-11-06 NOTE — ED Notes (Signed)
Son for Contact Information: [phone]

## 2019-11-06 NOTE — ED Provider Notes (Signed)
[facility] EMERGENCY DEPARTMENT Provider Note   CSN: [id number] Arrival date & time: 11/06/19  1253     History Chief Complaint  Patient presents with  . Low Hgb    Dawn Aguirre is a 86 y.o. female.  Dawn Aguirre is a 86 y.o. female with a history of hypertension, hyperlipidemia, breast cancer, GERD, rheumatoid arthritis, spinal stenosis, aortic stenosis, who presents to the emergency department via [facility] accompanied by her son for evaluation of low hemoglobin and generalized weakness.  Son reports that about 3 weeks ago patient had a follow-up appointment with her primary care doctor where she had routine lab work done they were called a few days later and informed that her hemoglobin was 7.5, her hemoglobin often ranges between 8-10, but this is the lowest it ever been.  The family has not noticed any bloody or dark stools.  Patient has not had any vomiting she is occasionally complained of some epigastric abdominal pain but has not been evaluated for this.  They report poor oral intake over the past 2 weeks and that she has become increasingly weak and essentially bedbound.  She has not had any fevers but they report frequent chills and that she has trouble staying warm.  They state that over the past several months she has had a decline in her mental status and they think she has developed some mild dementia but she does not have any acute change in mental status or worsening confusion today.  Son does report some urinary frequency.  No cough, chest pain or shortness of breath.  They were scheduled to see GI regarding low hemoglobin later this week but due to worsening weakness brought her to the ED for further evaluation.  Level 5 caveat: Dementia        Past Medical History:  Diagnosis Date  . Anxiety   . Aortic stenosis    a. mild-mod by echo 2012.  . Cancer of left breast ([place])    surgery at [facility]  . Chronic airway obstruction, not elsewhere classified    . Colitis, ischemic ([place]) 07/2000   ? infect  . Degeneration of cervical intervertebral disc   . Diffuse cystic mastopathy   . Diverticulosis of colon (without mention of hemorrhage)   . Esophageal reflux   . Essential hypertension   . External hemorrhoids without mention of complication   . Fibula fracture 2006  . Hyperlipidemia    a. intol of multiple statins.  . Lichen sclerosus    back/abd/vulvar  . Mastoiditis of right side 04/2005   admitted  . Mild mitral regurgitation 2012  . Moderate tricuspid regurgitation 2012  . Osteoporosis, unspecified   . Personal history of tobacco use, presenting hazards to health   . RA (rheumatoid arthritis) ([facility])    Sero-negative  . Raynaud's syndrome   . Spinal stenosis   . Stress at home    "caring for son who is disabled" (12/08/2016)  . Unspecified gastritis and gastroduodenitis without mention of hemorrhage   . Unspecified hypothyroidism     Patient Active Problem List   Diagnosis Date Noted  . Anemia 11/06/2019  . Iron deficiency anemia 10/24/2019  . Vaginal pain 06/20/2019  . Left foot pain 01/22/2019  . Epistaxis 12/27/2018  . Hypertension, essential 10/06/2017  . Right hand pain 06/05/2017  . Abrasion of left ring finger 06/05/2017  . Injury caused by dog bite, initial encounter 05/19/2017  . Tricuspid regurgitation 12/09/2016  . Mitral regurgitation 12/09/2016  .  Normochromic anemia 12/09/2016  . Chronic abdominal pain 10/18/2016  . Urine frequency 09/09/2016  . Malaise and fatigue 09/09/2016  . Sleep disorder 01/13/2016  . Female cystocele 12/30/2015  . Left shoulder pain 09/02/2015  . Neck pain 09/02/2015  . Routine general medical examination at a health care facility 07/22/2015  . Hearing loss 07/22/2015  . Prediabetes 07/22/2015  . Depression with anxiety 04/24/2015  . Tremor 12/20/2014  . Chronic pain 10/14/2014  . B12 deficiency 07/16/2014  . Vitamin D deficiency 07/16/2014  . Fatigue 07/04/2014  .  Encounter for Medicare annual wellness exam 01/13/2014  . Fall in home 12/24/2013  . History of breast cancer 12/18/2013  . Epiploic appendagitis 02/22/2013  . Dry mouth 02/04/2013  . Benign paroxysmal positional vertigo 09/14/2012  . Low back pain 09/07/2012  . Constipation 11/04/2011  . Aortic stenosis 08/31/2010  . Hyperlipidemia   . STRESS REACTION, ACUTE, WITH EMOTIONAL DISTURBANCE 10/12/2007  . [place] DISEASE, CERVICAL 10/12/2007  . Hypothyroidism 01/22/2007  . RAYNAUD'S SYNDROME 01/22/2007  . EXTERNAL HEMORRHOIDS 01/22/2007  . DIVERTICULOSIS, COLON 01/22/2007  . FIBROCYSTIC BREAST DISEASE 01/22/2007  . Osteoporosis 01/22/2007  . GERD 12/16/2006  . DYSPNEA ON EXERTION 12/16/2006  . TOBACCO ABUSE, HX OF 12/16/2006  . HYSTERECTOMY, HX OF 12/16/2006    Past Surgical History:  Procedure Laterality Date  . ABDOMINAL HYSTERECTOMY  1968  . BREAST BIOPSY Left   . BREAST LUMPECTOMY Left   . Cardiollite  02/2002   Neg  . CATARACT EXTRACTION W/ INTRAOCULAR LENS IMPLANT Right   . COLONOSCOPY    . COLONOSCOPY    . DEXA  2001   OP,heel  . DEXA     OP hip T-2.94  . NASAL SINUS SURGERY  10/2002   Archie Endo 06/23/2010  . Nuclear Stress Test  11/07   Normal  . SHOULDER ARTHROSCOPY W/ ROTATOR CUFF REPAIR Right 06/2005   Archie Endo 06/23/2010  . TONSILLECTOMY    . US ECHOCARDIOGRAPHY  10/06   normal LVF EF 55-65%     OB History   No obstetric history on file.     Family History  Problem Relation Age of Onset  . Heart attack Father 46  . Coronary artery disease Sister 49       CABG  . Heart attack Brother 67  . Coronary artery disease Other        GF    Social History   Tobacco Use  . Smoking status: Former Smoker    Packs/day: 2.00    Years: 10.00    Pack years: 20.00    Types: Cigarettes    Quit date: 02/07/1962    Years since quitting: 57.7  . Smokeless tobacco: Never Used  Vaping Use  . Vaping Use: Never used  Substance Use Topics  . Alcohol use: Yes     Alcohol/week: 0.0 standard drinks    Comment: 12/08/2016 "glass of wine a couple times/month"  . Drug use: No    Home Medications Prior to Admission medications   Medication Sig Start Date End Date Taking? Authorizing Provider  busPIRone (BUSPAR) 30 MG tablet TAKE 1 TABLET (30 MG TOTAL) BY MOUTH 2 (TWO) TIMES DAILY. 11/05/19   Tower, Wynelle Fanny, MD  clobetasol ointment (TEMOVATE) 6.06 % Apply 1 application topically daily. To affected vulvar areas daily at bedtime 06/20/19   Tower, [place] A, MD  cyanocobalamin ([employer] VITAMIN B12) 2000 MCG tablet Take 2,000 mcg by mouth daily. Patient not taking: Reported on 10/16/2019    [provider]  levothyroxine (SYNTHROID) 100 MCG tablet TAKE 1 TABLET BY MOUTH EVERY DAY 12/10/18   Tower, Wynelle Fanny, MD  mirtazapine (REMERON) 15 MG tablet Take 1 tablet (15 mg total) by mouth at bedtime. 10/16/19   Tower, Wynelle Fanny, MD  olmesartan-hydrochlorothiazide (BENICAR HCT) 40-25 MG tablet TAKE 1 TABLET BY MOUTH EVERY DAY 10/15/19   Tower, Wynelle Fanny, MD  Polyethylene Glycol 3350 (MIRALAX PO) Take 17 g by mouth daily as needed.     [provider]    Allergies    Ace inhibitors, Dm-apap-cpm, Ezetimibe, Ezetimibe-simvastatin, Macrobid [nitrofurantoin], Moxifloxacin, Rosuvastatin, Septra [sulfamethoxazole-trimethoprim], Simvastatin, and Triaminic  Review of Systems   Review of Systems  Unable to perform ROS: Dementia    Physical Exam Updated Vital Signs BP (!) 98/55   Pulse 80   Temp 97.6 F (36.4 C) (Oral)   Resp 19   SpO2 100%   Physical Exam Vitals and nursing note reviewed.  Constitutional:      General: She is not in acute distress.    Appearance: Normal appearance. She is well-developed. She is not diaphoretic.     Comments: Chronically ill appearing, cachectic, elderly female, in no acute distress, pleasantly demented  HENT:     Head: Normocephalic and atraumatic.     Mouth/Throat:     Mouth: Mucous membranes are dry.     Pharynx: Oropharynx is  clear.  Eyes:     General:        Right eye: No discharge.        Left eye: No discharge.  Cardiovascular:     Rate and Rhythm: Normal rate and regular rhythm.     Heart sounds: Normal heart sounds. No murmur heard.  No friction rub. No gallop.   Pulmonary:     Effort: Pulmonary effort is normal. No respiratory distress.     Breath sounds: Normal breath sounds. No wheezing or rales.     Comments: Respirations equal and unlabored, mildly tachypneic, patient able to speak in full sentences, lungs clear to auscultation bilaterally  With some diminished breath sounds Abdominal:     General: Bowel sounds are normal. There is no distension.     Palpations: Abdomen is soft. There is no mass.     Tenderness: There is abdominal tenderness. There is no guarding.     Comments: Abdomen is soft, nondistended, bowel sounds present throughout, patient endorses some faint tenderness with epigastric palpation, although quadrants nontender, no peritoneal signs  Genitourinary:    Rectum: Normal. Guaiac result negative.     Comments: Chaperone present, no external hemorrhoids, lesions or fissures, soft brown stool present on rectal exam, no melena or hematochezia, normal rectal tone Musculoskeletal:        General: No deformity.     Cervical back: Neck supple.  Skin:    General: Skin is warm and dry.     Capillary Refill: Capillary refill takes less than 2 seconds.     Coloration: Skin is pale.  Neurological:     Mental Status: She is alert.     Coordination: Coordination normal.     Comments: Speech is clear, able to follow commands Moves extremities without ataxia, coordination intact  Psychiatric:        Mood and Affect: Mood normal.        Behavior: Behavior normal.     ED Results / Procedures / Treatments   Labs (all labs ordered are listed, but only abnormal results are displayed) Labs Reviewed  COMPREHENSIVE  METABOLIC PANEL - Abnormal; Notable for the following components:      Result  Value   Sodium 134 (*)    Potassium 5.5 (*)    CO2 19 (*)    Glucose, Bld 138 (*)    BUN 70 (*)    Creatinine, Ser 2.36 (*)    Calcium 8.7 (*)    Albumin 2.1 (*)    GFR calc non Af Amer 17 (*)    GFR calc Af Amer 20 (*)    All other components within normal limits  CBC WITH DIFFERENTIAL/PLATELET - Abnormal; Notable for the following components:   RBC 3.41 (*)    Hemoglobin 7.0 (*)    HCT 25.2 (*)    MCV 73.9 (*)    MCH 20.5 (*)    MCHC 27.8 (*)    RDW 18.6 (*)    All other components within normal limits  URINE CULTURE  RESPIRATORY PANEL BY RT PCR (FLU A&B, COVID)  CULTURE, BLOOD (ROUTINE X 2)  CULTURE, BLOOD (ROUTINE X 2)  URINALYSIS, ROUTINE W REFLEX MICROSCOPIC  VITAMIN B12  FOLATE  IRON AND TIBC  FERRITIN  RETICULOCYTES  LACTIC ACID, PLASMA  LACTIC ACID, PLASMA  TSH  POC OCCULT BLOOD, ED  TYPE AND SCREEN  ABO/RH  PREPARE RBC (CROSSMATCH)    EKG EKG Interpretation  Date/Time:  Wednesday November 06 2019 13:07:29 EDT Ventricular Rate:  78 PR Interval:    QRS Duration: 83 QT Interval:  372 QTC Calculation: 424 R Axis:   -38 Text Interpretation: Sinus rhythm Left axis deviation Low voltage, precordial leads Abnormal R-wave progression, early transition Confirmed by Quintella Reichert [phone]) on 11/06/2019 1:29:58 PM   Radiology DG Chest Portable 1 View  Result Date: 11/06/2019 CLINICAL DATA:  Weakness, anemia EXAM: PORTABLE CHEST 1 VIEW COMPARISON:  Prior chest x-ray 02/19/2019 FINDINGS: Stable cardiomegaly. No significant vascular congestion or evidence of pulmonary edema. Mediastinal contours are stable. Atherosclerotic calcifications present in the transverse aorta. Stable mild bronchitic changes. No focal airspace consolidation, pleural effusion or pneumothorax. Surgical changes of prior left breast biopsy again noted. IMPRESSION: 1. No acute cardiopulmonary process. 2. Stable chronic bronchitic changes and mild cardiomegaly. 3. Aortic atherosclerosis.  Electronically Signed   By: Jacqulynn Cadet M.D.   On: 11/06/2019 13:52    Procedures .Critical Care Performed by: Jacqlyn Larsen, PA-C Authorized by: Jacqlyn Larsen, PA-C   Critical care provider statement:    Critical care time (minutes):  45   Critical care was necessary to treat or prevent imminent or life-threatening deterioration of the following conditions:  Renal failure and circulatory failure   Critical care was time spent personally by me on the following activities:  Discussions with consultants, evaluation of patient's response to treatment, examination of patient, ordering and performing treatments and interventions, ordering and review of laboratory studies, ordering and review of radiographic studies, pulse oximetry, re-evaluation of patient's condition, obtaining history from patient or surrogate and review of old charts   (including critical care time)  Medications Ordered in ED Medications  0.9 %  sodium chloride infusion (has no administration in time range)  sodium chloride flush (NS) 0.9 % injection 3 mL (has no administration in time range)  senna (SENOKOT) tablet 8.6 mg (has no administration in time range)  magnesium hydroxide (MILK OF MAGNESIA) suspension 30 mL (has no administration in time range)  sorbitol 70 % solution 30 mL (has no administration in time range)  ondansetron (ZOFRAN) tablet 4 mg (has no administration  in time range)    Or  ondansetron (ZOFRAN) injection 4 mg (has no administration in time range)  hydrALAZINE (APRESOLINE) injection 10 mg (has no administration in time range)  heparin injection 5,000 Units (has no administration in time range)  0.9 %  sodium chloride infusion (has no administration in time range)  multivitamin with minerals tablet 1 tablet (has no administration in time range)  sodium chloride 0.9 % bolus 500 mL (500 mLs Intravenous New Bag/Given 11/06/19 1556)    ED Course  I have reviewed the triage vital signs and the  nursing notes.  Pertinent labs & imaging results that were available during my care of the patient were reviewed by me and considered in my medical decision making (see chart for details).    MDM Rules/Calculators/A&P                         86 year old female presents accompanied by her son for evaluation of low hemoglobin and generalized weakness.  Hemoglobin of 7.5 at PCP appointment 3 weeks ago with no focal bleeding symptoms.  Has been generally weak and essentially bedridden over the past 2 weeks, has trouble staying warm, no acute change in mental status but chronically progressing dementia over the past several months.  On arrival patient with blood pressures in the low 100s, no tachycardia, or fever.  On exam lungs are clear.  Abdomen with some faint epigastric tenderness but no peritoneal signs.  No gross melena or hematochezia on exam.  Will get basic labs, urinalysis, chest x-ray, EKG, Hemoccult and type and screen.  Patient's Hemoccult is negative, but hemoglobin has decreased further and is at 7, microcytic anemia noted on CBC, no leukocytosis.  Will get anemia panel  Patient with significant electrolyte derangements and AKI, creatinine of 2.36, was previously 1.3, BUN of 70 suggest significant dehydration and prerenal contribution.  Patient also has slightly elevated potassium of 5.5, CO2 of 19.  Normal liver function.  Patient started on fluid bolus and will transfuse 1 unit of blood for symptomatic anemia.  Patient's chest x-ray is clear, EKG with sinus rhythm without ischemic changes.  Urinalysis is pending.  Discussed with patient's family that she will prior admission for worsening anemia and AKI.  Medicine consult placed.  Case discussed with Dr. Pietro Cassis with [facility] hospitalist who will see and admit the patient, request lactic acid and blood cultures be added on given transient hypotension noted in ED, I suspect this is more so due to hypovolemia, but hospitalist would like to  look for potential infection as well.  They do not want antibiotics at this time.  Final Clinical Impression(s) / ED Diagnoses Final diagnoses:  Symptomatic anemia  AKI (acute kidney injury) ([place])  Generalized weakness    Rx / DC Orders ED Discharge Orders    None       Janet Berlin 11/06/19 1847    Quintella Reichert, MD 11/07/19 [phone]

## 2019-11-06 NOTE — ED Triage Notes (Signed)
Pt arrived to ED via PTAR from home w/ c/o low Hgb, generalized weakness and decreased oral intake. 3 weeks ago she went to her PCP and a week later they called her and told her her Hgb was 7.5. For 2 weeks pt has had increased weakness, not getting out of bed and decreased oral intake. Pt has a follow-up appt w/ GI later this week, but family called provider and notified them of s/s and were told to send pt to ED.

## 2019-11-07 DIAGNOSIS — D649 Anemia, unspecified: Secondary | ICD-10-CM | POA: Diagnosis not present

## 2019-11-07 LAB — CBC
HCT: 26.2 % — ABNORMAL LOW (ref 36.0–46.0)
Hemoglobin: 7.4 g/dL — ABNORMAL LOW (ref 12.0–15.0)
MCH: 21.5 pg — ABNORMAL LOW (ref 26.0–34.0)
MCHC: 28.2 g/dL — ABNORMAL LOW (ref 30.0–36.0)
MCV: 76.2 fL — ABNORMAL LOW (ref 80.0–100.0)
Platelets: 344 10*3/uL (ref 150–400)
RBC: 3.44 MIL/uL — ABNORMAL LOW (ref 3.87–5.11)
RDW: 19.3 % — ABNORMAL HIGH (ref 11.5–15.5)
WBC: 5.2 10*3/uL (ref 4.0–10.5)
nRBC: 0 % (ref 0.0–0.2)

## 2019-11-07 LAB — BASIC METABOLIC PANEL
Anion gap: 12 (ref 5–15)
BUN: 56 mg/dL — ABNORMAL HIGH (ref 8–23)
CO2: 19 mmol/L — ABNORMAL LOW (ref 22–32)
Calcium: 8.3 mg/dL — ABNORMAL LOW (ref 8.9–10.3)
Chloride: 105 mmol/L (ref 98–111)
Creatinine, Ser: 1.91 mg/dL — ABNORMAL HIGH (ref 0.44–1.00)
GFR calc Af Amer: 26 mL/min — ABNORMAL LOW (ref >60)
GFR calc non Af Amer: 22 mL/min — ABNORMAL LOW (ref >60)
Glucose, Bld: 118 mg/dL — ABNORMAL HIGH (ref 70–99)
Potassium: 4.7 mmol/L (ref 3.5–5.1)
Sodium: 136 mmol/L (ref 135–145)

## 2019-11-07 LAB — URINE CULTURE

## 2019-11-07 LAB — CBC WITH DIFFERENTIAL/PLATELET
Abs Immature Granulocytes: 0.02 10*3/uL (ref 0.00–0.07)
Basophils Absolute: 0 10*3/uL (ref 0.0–0.1)
Basophils Relative: 0 %
Eosinophils Absolute: 0.2 10*3/uL (ref 0.0–0.5)
Eosinophils Relative: 2 %
HCT: 25.1 % — ABNORMAL LOW (ref 36.0–46.0)
Hemoglobin: 7.3 g/dL — ABNORMAL LOW (ref 12.0–15.0)
Immature Granulocytes: 0 %
Lymphocytes Relative: 19 %
Lymphs Abs: 1.2 10*3/uL (ref 0.7–4.0)
MCH: 22.1 pg — ABNORMAL LOW (ref 26.0–34.0)
MCHC: 29.1 g/dL — ABNORMAL LOW (ref 30.0–36.0)
MCV: 75.8 fL — ABNORMAL LOW (ref 80.0–100.0)
Monocytes Absolute: 0.7 10*3/uL (ref 0.1–1.0)
Monocytes Relative: 12 %
Neutro Abs: 4.1 10*3/uL (ref 1.7–7.7)
Neutrophils Relative %: 67 %
Platelets: 332 10*3/uL (ref 150–400)
RBC: 3.31 MIL/uL — ABNORMAL LOW (ref 3.87–5.11)
RDW: 19.4 % — ABNORMAL HIGH (ref 11.5–15.5)
WBC: 6.3 10*3/uL (ref 4.0–10.5)
nRBC: 0 % (ref 0.0–0.2)

## 2019-11-07 LAB — MAGNESIUM: Magnesium: 2 mg/dL (ref 1.7–2.4)

## 2019-11-07 LAB — PHOSPHORUS: Phosphorus: 3.9 mg/dL (ref 2.5–4.6)

## 2019-11-07 LAB — LACTIC ACID, PLASMA: Lactic Acid, Venous: 0.8 mmol/L (ref 0.5–1.9)

## 2019-11-07 MED ORDER — ORAL CARE MOUTH RINSE
15.0000 mL | Freq: Two times a day (BID) | OROMUCOSAL | Status: DC
  Administered 2019-11-07 – 2019-11-08 (×2): 15 mL via OROMUCOSAL

## 2019-11-07 MED ORDER — SODIUM CHLORIDE 0.9 % IV SOLN
1250.0000 mg | Freq: Once | INTRAVENOUS | Status: AC
  Administered 2019-11-07: 1250 mg via INTRAVENOUS
  Filled 2019-11-07: qty 25

## 2019-11-07 MED ORDER — SODIUM CHLORIDE 0.9 % IV SOLN
25.0000 mg | Freq: Once | INTRAVENOUS | Status: AC
  Administered 2019-11-07: 25 mg via INTRAVENOUS
  Filled 2019-11-07: qty 0.5

## 2019-11-07 NOTE — Consult Note (Addendum)
Referring Provider:  Dr. Pietro Cassis, [facility] Primary Care Physician:  Tower, Wynelle Fanny, MD Primary Gastroenterologist:  Dr. Earlean Shawl  Reason for Consultation:  Anemia  HPI: Dawn Aguirre is a 86 y.o. female with PMH significant for HTN, HLD, left breast cancer, diverticulosis, GERD.  Patient lives at home, with her husband.  She is otherwise functional/does not need assistance to get around.  One of her sons had Down syndrome and died in 07-01-2019 under hospice care.  Three weeks ago, patient was seen by her PCP and found to have low hemoglobin of 7.8 grams.  Patient has an appointment with GI, Dr. Earlean Shawl, coming up for this issue, but because of her worsening symptoms of fatigue and weakness she was brought to the ED.  Here Hgb was 7.0 grams and 7.3 grams.  Four months ago was 8.2 grams and one year ago was 10.9 grams.  She has received one unit of PRBCs, but no repeat Hgb has been drawn yet.  She was heme negative here.  Denies ever seeing red blood in her stools or black stools at home.  Had a brown BM here this AM per her nurse.  Not on blood thinning medications.  She struggles with chronic constipation for years and has imaging studies that show large amounts of stool in the colon.  She denies having any abdominal pain.  Denies heartburn/reflux.  No dysphagia.  Looks like last colonoscopy was probably 2004 with diverticulosis.  She tells me that she does not want any procedures performed.  Past Medical History:  Diagnosis Date  . Anxiety   . Aortic stenosis    a. mild-mod by echo 2012.  . Cancer of left breast ([place])    surgery at [facility]  . Chronic airway obstruction, not elsewhere classified   . Colitis, ischemic ([place]) 07/2000   ? infect  . Degeneration of cervical intervertebral disc   . Diffuse cystic mastopathy   . Diverticulosis of colon (without mention of hemorrhage)   . Esophageal reflux   . Essential hypertension   . External hemorrhoids without mention of complication   . Fibula  fracture 2006  . Hyperlipidemia    a. intol of multiple statins.  . Lichen sclerosus    back/abd/vulvar  . Mastoiditis of right side 04/2005   admitted  . Mild mitral regurgitation 2012  . Moderate tricuspid regurgitation 2012  . Osteoporosis, unspecified   . Personal history of tobacco use, presenting hazards to health   . RA (rheumatoid arthritis) ([facility])    Sero-negative  . Raynaud's syndrome   . Spinal stenosis   . Stress at home    "caring for son who is disabled" (12/08/2016)  . Unspecified gastritis and gastroduodenitis without mention of hemorrhage   . Unspecified hypothyroidism     Past Surgical History:  Procedure Laterality Date  . ABDOMINAL HYSTERECTOMY  1968  . BREAST BIOPSY Left   . BREAST LUMPECTOMY Left   . Cardiollite  02/2002   Neg  . CATARACT EXTRACTION W/ INTRAOCULAR LENS IMPLANT Right   . COLONOSCOPY    . COLONOSCOPY    . DEXA  2001   OP,heel  . DEXA     OP hip T-2.94  . NASAL SINUS SURGERY  10/2002   Archie Endo 06/23/2010  . Nuclear Stress Test  11/07   Normal  . SHOULDER ARTHROSCOPY W/ ROTATOR CUFF REPAIR Right 06/30/05   [facility] Endo 06/23/2010  . TONSILLECTOMY    . US ECHOCARDIOGRAPHY  10/06   normal LVF  EF 55-65%    Prior to Admission medications   Medication Sig Start Date End Date Taking? Authorizing Provider  busPIRone (BUSPAR) 30 MG tablet TAKE 1 TABLET (30 MG TOTAL) BY MOUTH 2 (TWO) TIMES DAILY. 11/05/19  Yes Tower, Wynelle Fanny, MD  cyanocobalamin ([employer] VITAMIN B12) 2000 MCG tablet Take 2,000 mcg by mouth daily.    Yes [provider]  levothyroxine (SYNTHROID) 100 MCG tablet TAKE 1 TABLET BY MOUTH EVERY DAY Patient taking differently: Take 100 mcg by mouth daily.  12/10/18  Yes Tower, Wynelle Fanny, MD  mirtazapine (REMERON) 15 MG tablet Take 1 tablet (15 mg total) by mouth at bedtime. 10/16/19  Yes Tower, Wynelle Fanny, MD  olmesartan-hydrochlorothiazide (BENICAR HCT) 40-25 MG tablet TAKE 1 TABLET BY MOUTH EVERY DAY Patient taking differently: Take 1 tablet  by mouth daily.  10/15/19  Yes Tower, Wynelle Fanny, MD  clobetasol ointment (TEMOVATE) 9.62 % Apply 1 application topically daily. To affected vulvar areas daily at bedtime Patient not taking: Reported on 11/06/2019 06/20/19   [name], MD    Current Facility-Administered Medications  Medication Dose Route Frequency Provider Last Rate Last Admin  . 0.9 %  sodium chloride infusion   Intravenous Continuous Dahal, Binaya, MD 100 mL/hr at 11/07/19 0400 Rate Verify at 11/07/19 0400  . hydrALAZINE (APRESOLINE) injection 10 mg  10 mg Intravenous Q6H PRN Dahal, Binaya, MD      . levothyroxine (SYNTHROID) tablet 100 mcg  100 mcg Oral [id number] Terrilee Croak, MD   100 mcg at 11/07/19 0610  . magnesium hydroxide (MILK OF MAGNESIA) suspension 30 mL  30 mL Oral Daily PRN Dahal, Marlowe Aschoff, MD      . MEDLINE mouth rinse  15 mL Mouth Rinse BID Dahal, Binaya, MD      . multivitamin with minerals tablet 1 tablet  1 tablet Oral Daily Dahal, Binaya, MD   1 tablet at 11/06/19 1657  . ondansetron (ZOFRAN) tablet 4 mg  4 mg Oral Q6H PRN Dahal, Binaya, MD       Or  . ondansetron (ZOFRAN) injection 4 mg  4 mg Intravenous Q6H PRN Dahal, Binaya, MD      . pantoprazole (PROTONIX) injection 40 mg  40 mg Intravenous Q12H Dahal, Marlowe Aschoff, MD   40 mg at 11/07/19 0610  . senna (SENOKOT) tablet 8.6 mg  1 tablet Oral BID Dahal, Binaya, MD      . sodium chloride flush (NS) 0.9 % injection 3 mL  3 mL Intravenous Q12H Dahal, Marlowe Aschoff, MD   3 mL at 11/06/19 2211  . sorbitol 70 % solution 30 mL  30 mL Oral Daily PRN Terrilee Croak, MD        Allergies as of 11/06/2019 - Review Complete 11/06/2019  Allergen Reaction Noted  . Ace inhibitors  01/22/2007  . Dm-apap-cpm Nausea Only   . Ezetimibe  01/22/2007  . Ezetimibe-simvastatin  01/22/2007  . Macrobid [nitrofurantoin]  04/27/2015  . Moxifloxacin  12/16/2006  . Rosuvastatin  01/22/2007  . Septra [sulfamethoxazole-trimethoprim]  07/27/2011  . Simvastatin  01/22/2007  . Triaminic       Family History  Problem Relation Age of Onset  . Heart attack Father 48  . Coronary artery disease Sister 63       CABG  . Heart attack Brother 26  . Coronary artery disease Other        GF    Social History   Socioeconomic History  . Marital status: Married    Spouse name:  Not on file  . Number of children: 3  . Years of education: Not on file  . Highest education level: Not on file  Occupational History  . Occupation: Retired  Tobacco Use  . Smoking status: Former Smoker    Packs/day: 2.00    Years: 10.00    Pack years: 20.00    Types: Cigarettes    Quit date: 02/07/1962    Years since quitting: 57.7  . Smokeless tobacco: Never Used  Vaping Use  . Vaping Use: Never used  Substance and Sexual Activity  . Alcohol use: Yes    Alcohol/week: 0.0 standard drinks    Comment: 12/08/2016 "glass of wine a couple times/month"  . Drug use: No  . Sexual activity: Not Currently  Other Topics Concern  . Not on file  Social History Narrative   Retired   Lots of [employer], cares for disabled son   Social Determinants of Health   Financial Resource Strain:   . Difficulty of Paying Living Expenses: Not on file  Food Insecurity:   . Worried About Charity fundraiser in the Last Year: Not on file  . Ran Out of Food in the Last Year: Not on file  Transportation Needs:   . Lack of Transportation (Medical): Not on file  . Lack of Transportation (Non-Medical): Not on file  Physical Activity:   . Days of Exercise per Week: Not on file  . Minutes of Exercise per Session: Not on file  Stress:   . Feeling of Stress : Not on file  Social Connections:   . Frequency of Communication with Friends and Family: Not on file  . Frequency of Social Gatherings with Friends and Family: Not on file  . Attends Religious Services: Not on file  . Active Member of Clubs or Organizations: Not on file  . Attends Archivist Meetings: Not on file  . Marital Status:  Not on file  Intimate Partner Violence:   . Fear of Current or Ex-Partner: Not on file  . Emotionally Abused: Not on file  . Physically Abused: Not on file  . Sexually Abused: Not on file    Review of Systems: ROS is O/W negative except as mentioned in HPI.  Physical Exam: Vital signs in last 24 hours: Temp:  [97.6 F (36.4 C)-100 F (37.8 C)] 98.3 F (36.8 C) (09/30 0855) Pulse Rate:  [44-95] 75 (09/30 0533) Resp:  [11-27] 18 (09/30 0855) BP: (86-138)/(43-83) 112/53 (09/30 0855) SpO2:  [75 %-100 %] 99 % (09/30 0855) Weight:  [51.9 kg] 51.9 kg (09/29 2051) Last BM Date: 11/05/19 General:  Alert, Well-developed, well-nourished, pleasant and cooperative in NAD Head:  Normocephalic and atraumatic. Eyes:  Sclera clear, no icterus.  Conjunctiva pink. Ears:  Normal auditory acuity. Mouth:  No deformity or lesions.   Lungs:  Clear throughout to auscultation.  No wheezes, crackles, or rhonchi.  Heart:  Regular rate and rhythm; no murmurs, clicks, rubs, or gallops. Abdomen:  Soft, non-distended.  BS present.  Non-tender.  Rectal:  Deferred.  Was heme negative in the ED.  Msk:  Symmetrical without gross deformities. Pulses:  Normal pulses noted. Extremities:  Without clubbing or edema. Neurologic:  Alert and oriented x 4;  grossly normal neurologically. Skin:  Intact without significant lesions or rashes. Psych:  Alert and cooperative. Normal mood and affect.  Intake/Output from previous day: 09/29 0701 - 09/30 0700 In: 2015.8 [P.O.:50; I.V.:1091.8; Blood:375; IV Piggyback:499] Out: 4401 [UUVOZ:3664] Intake/Output this shift: Total  I/O In: -  Out: 200 [Urine:200]  Lab Results: Recent Labs    11/06/19 1328 11/07/19 0117  WBC 6.4 6.3  HGB 7.0* 7.3*  HCT 25.2* 25.1*  PLT PLATELET CLUMPS NOTED ON SMEAR, UNABLE TO ESTIMATE 332   BMET Recent Labs    11/06/19 1328 11/07/19 0117  NA 134* 136  K 5.5* 4.7  CL 102 105  CO2 19* 19*  GLUCOSE 138* 118*  BUN 70* 56*   CREATININE 2.36* 1.91*  CALCIUM 8.7* 8.3*   LFT Recent Labs    11/06/19 1328  PROT 6.7  ALBUMIN 2.1*  AST 28  ALT 31  ALKPHOS 116  BILITOT 0.6   Studies/Results: DG Chest Portable 1 View  Result Date: 11/06/2019 CLINICAL DATA:  Weakness, anemia EXAM: PORTABLE CHEST 1 VIEW COMPARISON:  Prior chest x-ray 02/19/2019 FINDINGS: Stable cardiomegaly. No significant vascular congestion or evidence of pulmonary edema. Mediastinal contours are stable. Atherosclerotic calcifications present in the transverse aorta. Stable mild bronchitic changes. No focal airspace consolidation, pleural effusion or pneumothorax. Surgical changes of prior left breast biopsy again noted. IMPRESSION: 1. No acute cardiopulmonary process. 2. Stable chronic bronchitic changes and mild cardiomegaly. 3. Aortic atherosclerosis. Electronically Signed   By: Jacqulynn Cadet M.D.   On: 11/06/2019 13:52   IMPRESSION:  *Microcytic anemia:  Slowly declining Hgb over the past several months or more.  Now Hgb in the 7 gram range, where it has been for 3 weeks at least.  Iron studies c/w anemia of chronic disease.  She is Hemoccult NEGATIVE.  She has had NO sign of GI bleeding and does not want procedures.  Received one unit of PRBCs.  No repeat Hgb since transfusion. *Chronic constipation:  Has been an issue for years with imaging showing a lot of stool in the colon.  PLAN: -Recommended Miralax daily at home and increase to BID if needed. -Give a dose of IV iron while she is here then ok for discharge with outpatient follow-up with Dr. Earlean Shawl as was previously planned. -Regular diet ordered.   Laban Emperor. Zehr  11/07/2019, 9:07 AM   ________________________________________________________________________  Velora Heckler GI MD note:  I personally examined the patient, reviewed the data and agree with the assessment and plan described above.  86 yo woman with hemocult negative, microcytic anemia.  I recommended that she undergo  testing to better understand her anemia with a colonoscopy and EGD. She is not at all interested in having any tests at all. She stated several times (with her daughter in the room and her son Merry Proud on a speaker phone) that she wants to be left alone and be allowed to go home. She specifically understands that without testing she may be ignoring something as serious as a cancer.  Neither family members are happy with her decision but the patient is making a clear informed decision and to me seems quite capable of making these types of decisions. The four of [place] had a nice discussion and I think all parties understand that we are obliged to adhere to the patients decision.  I recommend IV iron infusion before she leaves and that she take once daily iron supplement, indefinitely.    Please call or page with any further questions or concerns.    Owens Loffler, MD [facility] Pager [phone]

## 2019-11-07 NOTE — Progress Notes (Signed)
Pharmacy note: IV iron  86 yo female with iron deficiency anemia. Pharmacy consulted to dose IV iron. Plans noted for discharge in ~ 2 days -iron= 9, tsat= 5%, ferritin= 620 -hg= 7.3   Plan -Iron dextran test dose follow by 1250mg  IV x1 -Will follow up post test dose  Hildred Laser, PharmD Clinical Pharmacist **Pharmacist phone directory can now be found on [email or web address] ([email or web address]).  Listed under [place].

## 2019-11-07 NOTE — Progress Notes (Signed)
PROGRESS NOTE  Dawn Aguirre  DOB: Jul 25, 1925  PCP: [name], MD KGM:[id number]  DOA: 11/06/2019  LOS: 1 day   Chief Complaint  Patient presents with  . Low Hgb   Brief narrative: Dawn Aguirre is a 86 y.o. female with PMH significant for HTN, HLD, left breast cancer, diverticulosis, GERD. History obtained from patient, also helped by her son Mr. Dellis Filbert at bedside and daughter Ms. Romie Minus on the phone.   Patient lives at home, with her husband.  She is otherwise functional/does not need assistance to get around.  One of her sons had Down syndrome and died in July 22, 2019 under hospice care.  Family reports some degree of grief/depression since then.  They have also noticed onset of dementia in the interval.  For several months, patient complained of intermittent 'stomach' pain but never had evaluation.  Never had blood in stool or black stool or vomiting of blood.  Not any blood thinner. 3 weeks ago, patient was seen by her PCP and found to have low hemoglobin of 7.5.  For the duration patient is also having progressively worsening oral intake, generalized weakness, mostly bedbound status.  Patient has an appointment with GI later this week but because of her worsening symptoms, she was brought to the ER.  In the ED, patient is afebrile, heart rate in 70s mostly, blood pressure 101/49, breathing comfortably on room air. Labs with WBC 6.4, hemoglobin 7, MCV 74, sodium 134, potassium 5.5, BUN/creatinine 70/2.36, serum bicarb level low at 19, albumin low at 2.1. Chest x-ray normal.  Hospitalist service consulted for inpatient admission and management.  Subjective: Patient was seen and examined this morning. Elderly Caucasian female. Not in distress.  No active bleeding.  Assessment/Plan: Acute microcytic anemia Anemia of chronic disease -No report of hematochezia, hematemesis or melena but chronic 'stomach' pain. -suspect chronic GI bleeding.  However stool is  heme-negative -Per GI, patient does not need endoscopic evaluation inpatient. -Currently on Protonix. -Vitamin B12 is on lower side of normal, ferritin is elevated but iron level and TIBC is low.  Recent Labs    11/28/18 1135 01/22/19 1214 06/22/19 1618 06/22/19 1618 10/16/19 1516 10/16/19 1516 11/06/19 1328 11/06/19 1510 11/06/19 1630 11/07/19 0117  HGB  --   --  8.2*  --  7.8 Repeated and verified X2.*  --  7.0*  --   --  7.3*  MCV  --   --  78.7*   < > 69.3 Repeated and verified X2.*   < > 73.9*  --   --  75.8*  VITAMINB12 197* 130*  --   --  200*  --   --   --  302  --   FOLATE  --   --   --   --   --   --   --   --  7.5  --   FERRITIN  --   --   --   --   --   --   --   --  620*  --   TIBC  --   --   --   --   --   --   --   --  169*  --   IRON  --   --   --   --   --   --   --   --  9*  --   RETICCTPCT  --   --   --   --   --   --   --  0.6  --   --    < > = values in this interval not displayed.   AKI on CKD 3a -Baseline creatinine 1.09 in May, it was 1.31 3 weeks ago. -Presented with creatinine of 2.36.  Secondary to poor oral intake. -Expect improvement in creatinine with hydration. -Continue normal saline at 100 mill per hour overnight. Recent Labs    11/28/18 1135 01/22/19 1214 06/22/19 1618 10/16/19 1516 11/06/19 1328 11/07/19 0117  BUN 19 20 24* 28* 70* 56*  CREATININE 1.21* 1.08 1.09* 1.31* 2.36* 1.91*   Hypotension History of essential hypertension -Initially hypotensive on admission likely because of dehydration.  Currently blood pressure is on the low side of normal.   -Likely because of dehydration from poor oral intake. -No evidence of sepsis.  Lactic acid normal. -Continue IV fluid. -On olmesartan/HCTZ 40 mg / 25 mg daily at home.  Keep it on hold.  Hyperkalemia -Likely secondary to renal failure. -Improved. Recent Labs  Lab 11/06/19 1328 11/07/19 0117  K 5.5* 4.7  MG  --  2.0  PHOS  --  3.9   Chronic Vitamin B12 deficiency -Continue  vitamin B12 supplement  Hypothyroidism -Continue Synthroid.    Generalized weakness  -PT eval.  Depression -Hold buspirone and Remeron  Mobility: PT eval Code Status:   Code Status: Full Code  Nutritional status: Body mass index is 20.27 kg/m.     Diet Order            Diet regular Room service appropriate? Yes; Fluid consistency: Thin  Diet effective now                 DVT prophylaxis: SCD  Antimicrobials:  None Fluid: 100/h normal saline Consultants: GI Family Communication:  None at bedside  Status is: Inpatient  Remains inpatient appropriate because:IV treatments appropriate due to intensity of illness or inability to take PO   Dispo: The patient is from: Home              Anticipated d/c is to: Home              Anticipated d/c date is: 2 days              Patient currently is not medically stable to d/c.       Infusions:  . sodium chloride 100 mL/hr at 11/07/19 0400    Scheduled Meds: . levothyroxine  100 mcg Oral Q0600  . mouth rinse  15 mL Mouth Rinse BID  . multivitamin with minerals  1 tablet Oral Daily  . pantoprazole (PROTONIX) IV  40 mg Intravenous Q12H  . senna  1 tablet Oral BID  . sodium chloride flush  3 mL Intravenous Q12H    Antimicrobials: Anti-infectives (From admission, onward)   None      PRN meds: hydrALAZINE, magnesium hydroxide, ondansetron **OR** ondansetron (ZOFRAN) IV, sorbitol   Objective: Vitals:   11/07/19 0533 11/07/19 0855  BP: 130/61 (!) 112/53  Pulse: 75   Resp: 11 18  Temp: 98.7 F (37.1 C) 98.3 F (36.8 C)  SpO2: 97% 99%    Intake/Output Summary (Last 24 hours) at 11/07/2019 1039 Last data filed at 11/07/2019 0865 Gross per 24 hour  Intake 2015.82 ml  Output 1300 ml  Net 715.82 ml   Filed Weights   11/06/19 2051  Weight: 51.9 kg   Weight change:  Body mass index is 20.27 kg/m.   Physical Exam: General exam: Appears calm and comfortable.  Not in physical  distress Skin: No rashes,  lesions or ulcers. HEENT: Atraumatic, normocephalic, supple neck, no obvious bleeding Lungs: Clear to auscultation bilaterally CVS: Regular rate and rhythm, no murmur GI/Abd soft, nontender, nondistended, bowel sound present CNS: Alert, awake, slow to respond but oriented x3 Psychiatry: Mood appropriate Extremities: No pedal edema, no calf tenderness  Data Review: I have personally reviewed the laboratory data and studies available.  Recent Labs  Lab 11/06/19 1328 11/07/19 0117  WBC 6.4 6.3  NEUTROABS 4.1 4.1  HGB 7.0* 7.3*  HCT 25.2* 25.1*  MCV 73.9* 75.8*  PLT PLATELET CLUMPS NOTED ON SMEAR, UNABLE TO ESTIMATE 332   Recent Labs  Lab 11/06/19 1328 11/07/19 0117  NA 134* 136  K 5.5* 4.7  CL 102 105  CO2 19* 19*  GLUCOSE 138* 118*  BUN 70* 56*  CREATININE 2.36* 1.91*  CALCIUM 8.7* 8.3*  MG  --  2.0  PHOS  --  3.9    F/u labs ordered for tomorrow  Signed, Terrilee Croak, MD [facility] 11/07/2019

## 2019-11-07 NOTE — Evaluation (Signed)
Physical Therapy Evaluation Patient Details Name: Dawn Aguirre MRN: [id number] DOB: [birth year] Today's Date: 11/07/2019   History of Present Illness  Pt is 86 yo female with PMH of HTN, HLD, L breast CA, diverticulosis, GERD, and dementia.   Pt did lose a son in May 2021 and has experienced some depression/grief.  Pt presenting with progressive weakness.  She was found to have low hgb at PCP and had f/u with GI scheduled but symptoms worsened so presented to hospital. Pt admitted with anemia.  Clinical Impression  Pt admitted with above diagnosis. Pt presenting with decreased mobility, balance, safety, endurance, and strength.  She has hx of dementia but is normally independent at home without AD.  Today she ambulated 96' with and without RW.  She was very unsteady without RW.  Recommend 24 hr supervision, guarding with gait, and use of RW at home.  Pt has 24 hr support per family from friends, nurse techs, and family.  Pt currently with functional limitations due to the deficits listed below (see PT Problem List). Pt will benefit from skilled PT to increase their independence and safety with mobility to allow discharge to the venue listed below.       Follow Up Recommendations Home health PT;Supervision/Assistance - 24 hour    Equipment Recommendations  3in1 (PT)    Recommendations for Other Services       Precautions / Restrictions Precautions Precautions: Fall      Mobility  Bed Mobility Overal bed mobility: Needs Assistance Bed Mobility: Supine to Sit;Sit to Supine     Supine to sit: Min guard Sit to supine: Min guard   General bed mobility comments: min g for steadying  Transfers Overall transfer level: Needs assistance Equipment used: None;Rolling walker (2 wheeled) Transfers: Sit to/from Stand Sit to Stand: Min assist         General transfer comment: min A for steadying at times; cues for hand placement; performed x 1 with RW and x 1 with no  AD  Ambulation/Gait Ambulation/Gait assistance: Min assist Gait Distance (Feet): 80 Feet Assistive device: Rolling walker (2 wheeled);None Gait Pattern/deviations: Step-through pattern;Decreased stride length;Narrow base of support;Scissoring;Staggering left;Staggering right;Drifts right/left Gait velocity: decreased   General Gait Details: Pt ambulated 40' initially without AD and was unsteady requiring min A-mod A fot balance; added RW and requiring min guard except min A with turns.  Pt with tendencey to scissor - cued for increased BOS and posture (looking up).  Stairs            Wheelchair Mobility    Modified Rankin (Stroke Patients Only)       Balance Overall balance assessment: Needs assistance Sitting-balance support: No upper extremity supported Sitting balance-Leahy Scale: Good     Standing balance support: Bilateral upper extremity supported;Single extremity supported;No upper extremity supported Standing balance-Leahy Scale: Poor Standing balance comment: without UE support requiring mod A at times; with UE support requiring min guard to min A                             Pertinent Vitals/Pain Pain Assessment: No/denies pain    Home Living Family/patient expects to be discharged to:: Private residence Living Arrangements: Spouse/significant other (in addition to spouse has friends/family that stay at all times) Available Help at Discharge: Family;Available 24 hours/day Type of Home: House Home Access: Stairs to enter   [employer] of Steps: 2 in garage no rails; 4 in front  rail on both sides Home Layout: Multi-level;Able to live on main level with bedroom/bathroom Home Equipment: Shower seat;Walker - 2 wheels      Prior Function Level of Independence: Needs assistance   Gait / Transfers Assistance Needed: Ambulates without AD; can ambulate in community;  ADL's / Homemaking Assistance Needed: Pt was independent with ADLs ; she  could do IADL but also had assistance  Comments: 1 fall in past 6 montsh     Hand Dominance        Extremity/Trunk Assessment   Upper Extremity Assessment Upper Extremity Assessment: RUE deficits/detail;LUE deficits/detail RUE Deficits / Details: ROM WFL; MMT 4/5 LUE Deficits / Details: ROM WFL; MMT 4/5    Lower Extremity Assessment Lower Extremity Assessment: LLE deficits/detail;RLE deficits/detail RLE Deficits / Details: ROM WFL; MMT 4/5 LLE Deficits / Details: ROM WFL; MMT 4/5    Cervical / Trunk Assessment Cervical / Trunk Assessment: Normal  Communication   Communication: HOH  Cognition Arousal/Alertness: Awake/alert Behavior During Therapy: WFL for tasks assessed/performed Overall Cognitive Status: History of cognitive impairments - at baseline                                 General Comments: Decreased safety awareness, hx of dementia, able to follow commands but needs cues for redirecting and safety, pt able to answer most PLOF correctly but often goes on tangents and repeats self.  Daughter was present and provided further detail.      General Comments General comments (skin integrity, edema, etc.): VSS; Discussed PT role and POC.  Daughter and pt report that pt has 24 hr support at home and does not want SNF.  DIscussed home safety - using RW, having supervision/guarding with ambulation, and using BSC if needed.  Also, recommend HHPT.  Discussed HEP of AROM in bed and sit to stands with supervision.    Exercises     Assessment/Plan    PT Assessment Patient needs continued PT services  PT Problem List Decreased strength;Decreased mobility;Decreased safety awareness;Decreased coordination;Decreased activity tolerance;Decreased cognition;Cardiopulmonary status limiting activity;Decreased balance;Decreased knowledge of use of DME       PT Treatment Interventions DME instruction;Therapeutic activities;Gait training;Therapeutic exercise;Patient/family  education;Stair training;Balance training;Functional mobility training    PT Goals (Current goals can be found in the Care Plan section)  Acute Rehab PT Goals Patient Stated Goal: return home PT Goal Formulation: With patient Time For Goal Achievement: 11/21/19 Potential to Achieve Goals: Good    Frequency Min 3X/week   Barriers to discharge        Co-evaluation               AM-PAC PT "6 Clicks" Mobility  Outcome Measure Help needed turning from your back to your side while in a flat bed without using bedrails?: None Help needed moving from lying on your back to sitting on the side of a flat bed without using bedrails?: A Little Help needed moving to and from a bed to a chair (including a wheelchair)?: A Little Help needed standing up from a chair using your arms (e.g., wheelchair or bedside chair)?: A Little Help needed to walk in hospital room?: A Lot Help needed climbing 3-5 steps with a railing? : A Lot 6 Click Score: 17    End of Session Equipment Utilized During Treatment: Gait belt Activity Tolerance: Patient tolerated treatment well Patient left: in bed;with call bell/phone within reach;with bed alarm set;with family/visitor present Nurse  Communication: Mobility status PT Visit Diagnosis: Unsteadiness on feet (R26.81);History of falling (Z91.81);Muscle weakness (generalized) (M62.81)    Time: 5894-8347 (Evaluation was split due to nursing needing to give pt iron.  Saw pt for 28 minutes.) PT Time Calculation (min) (ACUTE ONLY): 88 min   Charges:   PT Evaluation $PT Eval Low Complexity: 1 Low PT Treatments $Therapeutic Activity: 8-22 mins        Abran Richard, PT Acute Rehab Services Pager [phone] [facility] 11/07/2019, 4:36 PM

## 2019-11-08 ENCOUNTER — Encounter (HOSPITAL_COMMUNITY): Payer: Self-pay | Admitting: Internal Medicine

## 2019-11-08 DIAGNOSIS — D649 Anemia, unspecified: Secondary | ICD-10-CM | POA: Diagnosis not present

## 2019-11-08 DIAGNOSIS — N179 Acute kidney failure, unspecified: Secondary | ICD-10-CM | POA: Diagnosis present

## 2019-11-08 LAB — CBC WITH DIFFERENTIAL/PLATELET
Abs Immature Granulocytes: 0.02 10*3/uL (ref 0.00–0.07)
Basophils Absolute: 0 10*3/uL (ref 0.0–0.1)
Basophils Relative: 1 %
Eosinophils Absolute: 0.1 10*3/uL (ref 0.0–0.5)
Eosinophils Relative: 2 %
HCT: 24.7 % — ABNORMAL LOW (ref 36.0–46.0)
Hemoglobin: 7.1 g/dL — ABNORMAL LOW (ref 12.0–15.0)
Immature Granulocytes: 0 %
Lymphocytes Relative: 17 %
Lymphs Abs: 1 10*3/uL (ref 0.7–4.0)
MCH: 21.7 pg — ABNORMAL LOW (ref 26.0–34.0)
MCHC: 28.7 g/dL — ABNORMAL LOW (ref 30.0–36.0)
MCV: 75.5 fL — ABNORMAL LOW (ref 80.0–100.0)
Monocytes Absolute: 0.7 10*3/uL (ref 0.1–1.0)
Monocytes Relative: 12 %
Neutro Abs: 3.8 10*3/uL (ref 1.7–7.7)
Neutrophils Relative %: 68 %
Platelets: 315 10*3/uL (ref 150–400)
RBC: 3.27 MIL/uL — ABNORMAL LOW (ref 3.87–5.11)
RDW: 19.3 % — ABNORMAL HIGH (ref 11.5–15.5)
WBC: 5.7 10*3/uL (ref 4.0–10.5)
nRBC: 0 % (ref 0.0–0.2)

## 2019-11-08 LAB — BASIC METABOLIC PANEL
Anion gap: 11 (ref 5–15)
BUN: 30 mg/dL — ABNORMAL HIGH (ref 8–23)
CO2: 17 mmol/L — ABNORMAL LOW (ref 22–32)
Calcium: 8.6 mg/dL — ABNORMAL LOW (ref 8.9–10.3)
Chloride: 107 mmol/L (ref 98–111)
Creatinine, Ser: 1.35 mg/dL — ABNORMAL HIGH (ref 0.44–1.00)
GFR calc Af Amer: 39 mL/min — ABNORMAL LOW (ref >60)
GFR calc non Af Amer: 34 mL/min — ABNORMAL LOW (ref >60)
Glucose, Bld: 107 mg/dL — ABNORMAL HIGH (ref 70–99)
Potassium: 4.4 mmol/L (ref 3.5–5.1)
Sodium: 135 mmol/L (ref 135–145)

## 2019-11-08 LAB — PREPARE RBC (CROSSMATCH)

## 2019-11-08 MED ORDER — SENNA 8.6 MG PO TABS: 1.0000 | ORAL_TABLET | Freq: Every evening | ORAL | 0 refills | Status: AC | PRN

## 2019-11-08 MED ORDER — MIRTAZAPINE 7.5 MG PO TABS: 15.0000 mg | ORAL_TABLET | Freq: Every day | ORAL | 0 refills | Status: DC

## 2019-11-08 MED ORDER — FERROUS SULFATE 300 (60 FE) MG/5ML PO SYRP: 300.0000 mg | ORAL_SOLUTION | Freq: Every day | ORAL | 0 refills | Status: DC

## 2019-11-08 MED ORDER — SODIUM CHLORIDE 0.9% IV SOLUTION: Freq: Once | INTRAVENOUS | Status: AC

## 2019-11-08 NOTE — Discharge Summary (Addendum)
Physician Discharge Summary  Dawn Aguirre WGN:[id number] DOB: 1925/11/19 DOA: 11/06/2019  PCP: Dawn Greenspan, MD  Admit date: 11/06/2019 Discharge date: 11/08/2019  Admitted From: Home Discharge disposition: Home health PT   Code Status: Full Code  Diet Recommendation: Regular diet  Discharge Diagnosis:   Principal Problem:   Anemia Active Problems:   B12 deficiency   Diverticulosis of colon   Iron deficiency anemia   Hypothyroidism   AKI (acute kidney injury) ([place])  History of Present Illness / Brief narrative:  Dawn Fotheringham Clappis a 86 y.o.femalewith PMH significant for HTN, HLD, left breast cancer,diverticulosis, GERD. History obtained from patient, also helped by her son Mr. Dawn Aguirre at bedsideanddaughter Ms. Dawn Aguirre on the phone. Patient lives at home, with her husband. She is otherwise functional/does not need assistance to get around.One of her sons hadDown syndrome and died in 03-Jul-2019 under hospice care. Family reports some degree of grief/depression since then. They have also noticed onset of dementia in the interval. For several months, patient complained of intermittent 'stomach'pain but never had evaluation. Never had blood in stool or black stool or vomiting of blood. Not any blood thinner. 3 weeks ago, patient was seen by her PCP and found to have low hemoglobin of 7.5. For the duration patient is also having progressively worsening oral intake, generalized weakness, mostly bedbound status. Patient has an appointment with GI later this week but because of her worsening symptoms, she was brought to the ER.  In the ED, patient is afebrile, heart rate in 70s mostly, blood pressure 101/49, breathing comfortably on room air. Labs with WBC 6.4, hemoglobin 7, MCV 74, sodium 134, potassium 5.5, BUN/creatinine 70/2.36, serum bicarb level low at 19, albumin low at 2.1. Chest x-ray normal.  Hospitalist service consulted for inpatient admission and  management.  Subjective:  Seen and examined this morning.  Lying down in bed.  Not in distress.  No blood in stool or black stool or vomiting blood.  Feels better.  Wants to go home. Discussed with patient's son Mr. Dawn Aguirre at bedside and daughter Ms. Dawn Aguirre on the phone.  Hospital Course:  Acute microcytic anemia Anemia of chronic disease -No report of hematochezia, hematemesis or melena but chronic'stomach' pain. -suspect chronic GI bleeding related to diverticulosis.  However stool is heme-negative at this time -GI consult appreciated.  EGD and colonoscopy recommended.  However patient is strongly and clearly denied despite multiple efforts to convince her by her family members. -Hemoglobin 7.1 this morning, likely diluted because of IV fluid. -1 more unit of PRBC given today.  Total 2 units during this hospitalization. -Patient will follow up with PCP as an outpatient and may need outpatient GI evaluation if continues to have anemia. Recent Labs    06/22/19 1618 10/16/19 1516 11/06/19 1328 11/07/19 0117 11/07/19 1225 11/08/19 0300  HGB 8.2* 7.8 Repeated and verified X2.* 7.0* 7.3* 7.4* 7.1*   Vitamin B12 deficiency -Continue vitamin B12 supplement Recent Labs    11/28/18 1135 01/22/19 1214 06/22/19 1618 10/16/19 1516 11/06/19 1328 11/06/19 1630 11/07/19 0117 11/07/19 1225 11/08/19 0300  MCV  --   --    < > 69.3 Repeated and verified X2.*   < >  --    < > 76.2* 75.5*  VITAMINB12 197* 130*  --  200*  --  302  --   --   --   FOLATE  --   --   --   --   --  7.5  --   --   --    < > =  values in this interval not displayed.   Iron deficiency -Iron level 9, TIBC low. -IV iron given in the hospital.  Ferrous sulfate oral tablets started at discharge. Recent Labs    11/06/19 1510 11/06/19 1630 11/07/19 0117 11/07/19 1225 11/08/19 0300  MCV  --   --    < > 76.2* 75.5*  FERRITIN  --  620*  --   --   --   TIBC  --  169*  --   --   --   IRON  --  9*  --   --   --     RETICCTPCT 0.6  --   --   --   --    < > = values in this interval not displayed.   AKI on CKD 3a -Baseline creatinine 1.09 in May, it was 1.31 3 weeks ago. -Presented with creatinine of 2.36. Secondary to poor oral intake. -Expect improvement in creatinine with hydration. -Continue normal saline at 100 mill per hour overnight. Recent Labs    11/28/18 1135 01/22/19 1214 06/22/19 1618 10/16/19 1516 11/06/19 1328 11/07/19 0117 11/08/19 0300  BUN 19 20 24* 28* 70* 56* 30*  CREATININE 1.21* 1.08 1.09* 1.31* 2.36* 1.91* 1.35*   Hypotension History of essential hypertension -Initially hypotensive on admission likely because of dehydration.  Currently blood pressure is on the low side of normal.   -Likely because of dehydration from poor oral intake. -No evidence of sepsis.  Lactic acid normal. -Continue IV fluid. -On olmesartan/HCTZ 40 mg / 25 mg daily at home.  Currently it is on hold. -Continue to monitor blood pressure at home.  If blood exceeds 150/90, she can be resumed on it.  Hyperkalemia -Likely secondary to renal failure. -Improved.  Recent Labs  Lab 11/06/19 1328 11/07/19 0117 11/08/19 0300  K 5.5* 4.7 4.4  MG  --  2.0  --   PHOS  --  3.9  --    Hypothyroidism -Continue Synthroid.   Generalized weakness -PT eval.  Poor appetite -Patient had been tried on Remeron 15 mg in the past by PCP.  Family stopped it because she was getting sleepy.  Can be tried on 7.5 mg at bedtime.  Stable for discharge home today with home health PT. Wound care:    Discharge Exam:   Vitals:   11/07/19 2004 11/07/19 2317 11/08/19 0413 11/08/19 0850  BP: 136/63 118/61 (!) 136/58 (!) 118/57  Pulse: 77 81 76   Resp: 20 (!) 22 19 19   Temp: 99.4 F (37.4 C) 99 F (37.2 C) 98.8 F (37.1 C) 98.1 F (36.7 C)  TempSrc: Oral Oral Oral Oral  SpO2: 100% 100% 97% 98%  Weight:      Height:        Body mass index is 20.27 kg/m.  General exam: Appears calm and comfortable.   Not in physical distress Skin: No rashes, lesions or ulcers. HEENT: Atraumatic, normocephalic, supple neck, no obvious bleeding Lungs: Clear to auscultation bilaterally CVS: Regular rate and rhythm, no murmur GI/Abd soft, nontender, nondistended, bowel sound present CNS: Alert, awake, oriented x3 Psychiatry: Mood appropriate Extremities: No pedal edema, no calf tenderness  Follow ups:   Discharge Instructions    Diet general   Complete by: As directed    Increase activity slowly   Complete by: As directed       Follow-up Information    Tower, Wynelle Fanny, MD Follow up in 1 week(s).   Specialties: Family Medicine, Radiology Contact information: [phone]  [place] [address] [phone]               Recommendations for Outpatient Follow-Up:   1. Follow-up with PCP as an outpatient  Discharge Instructions:  Follow with Primary MD Tower, Wynelle Fanny, MD in 7 days   Get CBC/BMP checked in next visit within 1 week by PCP or SNF MD ( we routinely change or add medications that can affect your baseline labs and fluid status, therefore we recommend that you get the mentioned basic workup next visit with your PCP, your PCP may decide not to get them or add new tests based on their clinical decision)  On your next visit with your PCP, please Get Medicines reviewed and adjusted.  Please request your PCP  to go over all Hospital Tests and Procedure/Radiological results at the follow up, please get all Hospital records sent to your Prim MD by signing hospital release before you go home.  Activity: As tolerated with Full fall precautions use walker/cane & assistance as needed  For Heart failure patients - Check your Weight same time everyday, if you gain over 2 pounds, or you develop in leg swelling, experience more shortness of breath or chest pain, call your Primary MD immediately. Follow Cardiac Low Salt Diet and 1.5 lit/day fluid restriction.  If you have smoked or  chewed Tobacco in the last 2 yrs please stop smoking, stop any regular Alcohol  and or any Recreational drug use.  If you experience worsening of your admission symptoms, develop shortness of breath, life threatening emergency, suicidal or homicidal thoughts you must seek medical attention immediately by calling 911 or calling your MD immediately  if symptoms less severe.  You Must read complete instructions/literature along with all the possible adverse reactions/side effects for all the Medicines you take and that have been prescribed to you. Take any new Medicines after you have completely understood and accpet all the possible adverse reactions/side effects.   Do not drive, operate heavy machinery, perform activities at heights, swimming or participation in water activities or provide baby sitting services if your were admitted for syncope or siezures until you have seen by Primary MD or a Neurologist and advised to do so again.  Do not drive when taking Pain medications.  Do not take more than prescribed Pain, Sleep and Anxiety Medications  Wear Seat belts while driving.   Please note You were cared for by a hospitalist during your hospital stay. If you have any questions about your discharge medications or the care you received while you were in the hospital after you are discharged, you can call the unit and asked to speak with the hospitalist on call if the hospitalist that took care of you is not available. Once you are discharged, your primary care physician will handle any further medical issues. Please note that NO REFILLS for any discharge medications will be authorized once you are discharged, as it is imperative that you return to your primary care physician (or establish a relationship with a primary care physician if you do not have one) for your aftercare needs so that they can reassess your need for medications and monitor your lab values.    Allergies as of 11/08/2019       Reactions   Ace Inhibitors    REACTION: cough   Dm-apap-cpm Nausea Only   Ezetimibe    REACTION: myalgia   Ezetimibe-simvastatin    REACTION: joints swell   Macrobid [nitrofurantoin]  ABD PAIN, ALSO BODY ACHES/PAIN   Moxifloxacin    Rosuvastatin    REACTION: myalgia   Septra [sulfamethoxazole-trimethoprim]    Sick feeling    Simvastatin    REACTION: myalgia   Triaminic       Medication List    STOP taking these medications   busPIRone 30 MG tablet Commonly known as: BUSPAR   clobetasol ointment 0.05 % Commonly known as: TEMOVATE   olmesartan-hydrochlorothiazide 40-25 MG tablet Commonly known as: BENICAR HCT     TAKE these medications   [employer] VITAMIN B12 2000 MCG tablet Generic drug: cyanocobalamin Take 2,000 mcg by mouth daily.   ferrous sulfate 300 (60 Fe) MG/5ML syrup Take 5 mLs (300 mg total) by mouth daily.   levothyroxine 100 MCG tablet Commonly known as: SYNTHROID TAKE 1 TABLET BY MOUTH EVERY DAY   mirtazapine 7.5 MG tablet Commonly known as: REMERON Take 2 tablets (15 mg total) by mouth at bedtime. What changed: medication strength   senna 8.6 MG Tabs tablet Commonly known as: SENOKOT Take 1 tablet (8.6 mg total) by mouth at bedtime as needed for mild constipation.       Time coordinating discharge: 35 minutes  The results of significant diagnostics from this hospitalization (including imaging, microbiology, ancillary and laboratory) are listed below for reference.    Procedures and Diagnostic Studies:   DG Chest Portable 1 View  Result Date: 11/06/2019 CLINICAL DATA:  Weakness, anemia EXAM: PORTABLE CHEST 1 VIEW COMPARISON:  Prior chest x-ray 02/19/2019 FINDINGS: Stable cardiomegaly. No significant vascular congestion or evidence of pulmonary edema. Mediastinal contours are stable. Atherosclerotic calcifications present in the transverse aorta. Stable mild bronchitic changes. No focal airspace consolidation, pleural effusion or pneumothorax.  Surgical changes of prior left breast biopsy again noted. IMPRESSION: 1. No acute cardiopulmonary process. 2. Stable chronic bronchitic changes and mild cardiomegaly. 3. Aortic atherosclerosis. Electronically Signed   By: Jacqulynn Cadet M.D.   On: 11/06/2019 13:52     Labs:   Basic Metabolic Panel: Recent Labs  Lab 11/06/19 1328 11/06/19 1328 11/07/19 0117 11/08/19 0300  NA 134*  --  136 135  K 5.5*   < > 4.7 4.4  CL 102  --  105 107  CO2 19*  --  19* 17*  GLUCOSE 138*  --  118* 107*  BUN 70*  --  56* 30*  CREATININE 2.36*  --  1.91* 1.35*  CALCIUM 8.7*  --  8.3* 8.6*  MG  --   --  2.0  --   PHOS  --   --  3.9  --    < > = values in this interval not displayed.   GFR Estimated Creatinine Clearance: 21.8 mL/min (A) (by C-G formula based on SCr of 1.35 mg/dL (H)). Liver Function Tests: Recent Labs  Lab 11/06/19 1328  AST 28  ALT 31  ALKPHOS 116  BILITOT 0.6  PROT 6.7  ALBUMIN 2.1*   No results for input(s): LIPASE, AMYLASE in the last 168 hours. No results for input(s): AMMONIA in the last 168 hours. Coagulation profile No results for input(s): INR, PROTIME in the last 168 hours.  CBC: Recent Labs  Lab 11/06/19 1328 11/07/19 0117 11/07/19 1225 11/08/19 0300  WBC 6.4 6.3 5.2 5.7  NEUTROABS 4.1 4.1  --  3.8  HGB 7.0* 7.3* 7.4* 7.1*  HCT 25.2* 25.1* 26.2* 24.7*  MCV 73.9* 75.8* 76.2* 75.5*  PLT PLATELET CLUMPS NOTED ON SMEAR, UNABLE TO ESTIMATE 332 344 315   Cardiac  Enzymes: No results for input(s): CKTOTAL, CKMB, CKMBINDEX, TROPONINI in the last 168 hours. BNP: Invalid input(s): POCBNP CBG: No results for input(s): GLUCAP in the last 168 hours. D-Dimer No results for input(s): DDIMER in the last 72 hours. Hgb A1c No results for input(s): HGBA1C in the last 72 hours. Lipid Profile No results for input(s): CHOL, HDL, LDLCALC, TRIG, CHOLHDL, LDLDIRECT in the last 72 hours. Thyroid function studies Recent Labs    11/06/19 1630  TSH 0.316*   Anemia  work up Recent Labs    11/06/19 1510 11/06/19 1630  VITAMINB12  --  302  FOLATE  --  7.5  FERRITIN  --  620*  TIBC  --  169*  IRON  --  9*  RETICCTPCT 0.6  --    Microbiology Recent Results (from the past 240 hour(s))  Blood culture (routine x 2)     Status: None (Preliminary result)   Collection Time: 11/06/19  4:24 PM   Specimen: BLOOD LEFT HAND  Result Value Ref Range Status   Specimen Description BLOOD LEFT HAND  Final   Special Requests   Final    BOTTLES DRAWN AEROBIC AND ANAEROBIC Blood Culture adequate volume   Culture   Final    NO GROWTH 2 DAYS Performed at [facility], [place] [address], [place], [place] [address]    Report Status PENDING  Incomplete  Respiratory Panel by RT PCR (Flu A&B, Covid) - Nasopharyngeal Swab     Status: None   Collection Time: 11/06/19  4:47 PM   Specimen: Nasopharyngeal Swab  Result Value Ref Range Status   SARS Coronavirus 2 by RT PCR NEGATIVE NEGATIVE Final    Comment: (NOTE) SARS-CoV-2 target nucleic acids are NOT DETECTED.  The SARS-CoV-2 RNA is generally detectable in upper respiratoy specimens during the acute phase of infection. The lowest concentration of SARS-CoV-2 viral copies this assay can detect is 131 copies/mL. A negative result does not preclude SARS-Cov-2 infection and should not be used as the sole basis for treatment or other patient management decisions. A negative result may occur with  improper specimen collection/handling, submission of specimen other than nasopharyngeal swab, presence of viral mutation(s) within the areas targeted by this assay, and inadequate number of viral copies (<131 copies/mL). A negative result must be combined with clinical observations, patient history, and epidemiological information. The expected result is Negative.  Fact Sheet for Patients:  PinkCheek.be  Fact Sheet for Healthcare Providers:  GravelBags.it  This  test is no t yet approved or cleared by the [place] FDA and  has been authorized for detection and/or diagnosis of SARS-CoV-2 by FDA under an Emergency Use Authorization (EUA). This EUA will remain  in effect (meaning this test can be used) for the duration of the COVID-19 declaration under Section 564(b)(1) of the Act, 21 U.S.C. section 360bbb-3(b)(1), unless the authorization is terminated or revoked sooner.     Influenza A by PCR NEGATIVE NEGATIVE Final   Influenza B by PCR NEGATIVE NEGATIVE Final    Comment: (NOTE) The Xpert Xpress SARS-CoV-2/FLU/RSV assay is intended as an aid in  the diagnosis of influenza from Nasopharyngeal swab specimens and  should not be used as a sole basis for treatment. Nasal washings and  aspirates are unacceptable for Xpert Xpress SARS-CoV-2/FLU/RSV  testing.  Fact Sheet for Patients: PinkCheek.be  Fact Sheet for Healthcare Providers: GravelBags.it  This test is not yet approved or cleared by the [place] and  has been authorized for  detection and/or diagnosis of SARS-CoV-2 by  FDA under an Emergency Use Authorization (EUA). This EUA will remain  in effect (meaning this test can be used) for the duration of the  Covid-19 declaration under Section 564(b)(1) of the Act, 21  U.S.C. section 360bbb-3(b)(1), unless the authorization is  terminated or revoked. Performed at [facility], [place] [address], [place], [place] [address]   Urine culture     Status: Abnormal   Collection Time: 11/07/19 12:23 AM   Specimen: Urine, Random  Result Value Ref Range Status   Specimen Description URINE, RANDOM  Final   Special Requests   Final    NONE Performed at [facility], [place] [address], [place], [place] [address]    Culture MULTIPLE SPECIES PRESENT, SUGGEST RECOLLECTION (A)  Final   Report Status 11/07/2019 FINAL  Final     Signed: Marlowe Aschoff Jayce Kainz  [facility] 11/08/2019, 11:13 AM

## 2019-11-08 NOTE — Progress Notes (Signed)
Patient discharged, PIV removed, telemetry d/c, AVS given and explained  To pt and pt's son.  Transported to [facility] by w/c.

## 2019-11-08 NOTE — Care Management CC44 (Signed)
Condition Code 44 Documentation Completed  Patient Details  Name: MONAE TOPPING MRN: [id number] Date of Birth: Dec 06, 1925   Condition Code 44 given:  Yes Patient signature on Condition Code 44 notice:  Yes Documentation of 2 MD's agreement:  Yes Code 44 added to claim:  Yes    Dawayne Shailynn, RN 11/08/2019, 1:24 PM

## 2019-11-08 NOTE — Care Management Obs Status (Signed)
[place] NOTIFICATION   Patient Details  Name: Dawn Aguirre MRN: [id number] Date of Birth: May 02, 1925   Medicare Observation Status Notification Given:  Yes    Dawayne Chalon, RN 11/08/2019, 1:23 PM

## 2019-11-08 NOTE — TOC Transition Note (Signed)
Transition of Care (TOC) - CM/SW Discharge Note Marvetta Gibbons RN, BSN Transitions of Care Unit 4E- RN Case Manager See Treatment Team for direct phone #    Patient Details  Name: Dawn Aguirre MRN: [id number] Date of Birth: [birth year]  Transition of Care [facility]) CM/SW Contact:  Dawayne Saidi, RN Phone Number: 11/08/2019, 2:32 PM   Clinical Narrative:    Pt stable for transition home today, orders placed for HHPT, CM spoke with pt and son at the bedside- pt is anxious to return home with spouse. Per conversation pt has walker at home- requesting a BSC. Will have [facility] order for DME- discussion had with pt and son for [facility] needs- list provided for [facility] choice Per CMS guidelines from medicare.gov website with star ratings (copy placed in shadow chart)- per their choice they would like to see if [facility] can accept if not then as backup will see if [facility] or [facility] can accept. They are agreeable to use in house provider [facility] for DME need.   Call made to [facility] with [facility] for HHPT need- [facility] is able to accept and will f/u with pt for start of care.   Call made to [facility] DME line for 3n1 need- DME to be delivered to room prior to discharge.   CC44 delivered and copy provided as well.    Final next level of care: [place] Barriers to Discharge: No Barriers Identified   Patient Goals and CMS Choice Patient states their goals for this hospitalization and ongoing recovery are:: return home with family CMS Medicare.gov Compare Post Acute Care list provided to:: Patient Choice offered to / list presented to : Patient, Adult Children  Discharge Placement               Home with [facility]        Discharge Plan and Services   Discharge Planning Services: CM Consult Post Acute Care Choice: Durable Medical Equipment, Home Health          DME Arranged: 3-N-1   Date DME Agency Contacted: 11/08/19 Time DME Agency Contacted: [phone] Representative spoke with at  DME Agency: [place]: PT [place]: [facility] [place] Date [place]: 11/08/19 Time [place]: [place] Representative spoke with at [place]: [place] ([place]) Interventions     Readmission Risk Interventions Readmission Risk Prevention Plan 11/08/2019  Post Dischage Appt Complete  Medication Screening Complete  Transportation Screening Complete  Some recent data might be hidden

## 2019-11-08 NOTE — Progress Notes (Signed)
Physical Therapy Treatment Patient Details Name: Dawn Aguirre MRN: [id number] DOB: 02-14-1925 Today's Date: 11/08/2019    History of Present Illness Pt is 86 yo female with PMH of HTN, HLD, L breast CA, diverticulosis, GERD, and dementia.   Pt did lose a son in May 2021 and has experienced some depression/grief.  Pt presenting with progressive weakness.  She was found to have low hgb at PCP and had f/u with GI scheduled but symptoms worsened so presented to hospital. Pt admitted with anemia.    PT Comments    Pt making good progress.  Demonstrated improved balance with RW but still with mild unsteadiness.  Session focused on balance, ambulation, discussion/education on home safety, and strategizing and safe plan for home.  Family has determined plan for initial 24 hr care and asked for list of agencies for private pay aides if needed in future.  Cont POC.   Follow Up Recommendations  Home health PT;Supervision/Assistance - 24 hour     Equipment Recommendations  3in1 (PT)    Recommendations for Other Services       Precautions / Restrictions Precautions Precautions: Fall Restrictions Weight Bearing Restrictions: Yes    Mobility  Bed Mobility Overal bed mobility: Needs Assistance Bed Mobility: Supine to Sit;Sit to Supine     Supine to sit: Supervision Sit to supine: Supervision   General bed mobility comments: performed x 2 without rails and with bed flat  Transfers Overall transfer level: Needs assistance Equipment used: None;Rolling walker (2 wheeled) Transfers: Sit to/from [facility] Sit to Stand: Supervision Stand pivot transfers: Supervision       General transfer comment: Performed sit to stand x  5 with supervision.  Pt will have someone in home at night but not in room.  Had her practice stand pivot to/from [facility] without assistance and she was able to do so.  Ambulation/Gait Ambulation/Gait assistance: Min guard Gait Distance (Feet): 200  Feet Assistive device: Rolling walker (2 wheeled);None Gait Pattern/deviations: Step-through pattern;Trunk flexed Gait velocity: decreased   General Gait Details: Ambulated 200' with RW and min guard for safety.  Cues to look up and take time.  Mild unsteadiness with turns but no LOB.  Ambulated 30' in room without AD and pt with 2 LOB requiring min A and tendencey to scissor.  Educated must use RW at home   Stairs             Wheelchair Mobility    Modified Rankin (Stroke Patients Only)       Balance Overall balance assessment: Needs assistance Sitting-balance support: No upper extremity supported Sitting balance-Leahy Scale: Good     Standing balance support: Bilateral upper extremity supported;No upper extremity supported Standing balance-Leahy Scale: Fair Standing balance comment: Able to stand and pivot to [facility] without AD but used RW for ambulation.               High Level Balance Comments: Sidestepping/forward/backward walking without AD - 5' each direction x 3 without AD and min A for balance.            Cognition Arousal/Alertness: Awake/alert Behavior During Therapy: WFL for tasks assessed/performed Overall Cognitive Status: History of cognitive impairments - at baseline                                 General Comments: Decreased safety awareness, hx of dementia, able to follow commands but needs cues for redirecting  and safety, pt able to answer most PLOF correctly but often goes on tangents and repeats self.  Son was present and provided further detail.      Exercises      General Comments General comments (skin integrity, edema, etc.): VSS.  Increased time discussing home safety and need for supervision with pt, son (present) , daughter on speaker phone.  Initially there was questions as to how often pt would have supervision.  At night pt will have someone in home but not in room (other than spouse) - demonstrated safe pivot to [facility]  and reports will not walk at night, family feels confident pt will abide by this.  Discussed feel that pt needs someone at least for a week to walk with her due to instability.  Family was initally concerned about weekend, but then have identified coverage.  Discussed HHPT will be out next week and can further assess how pt is doing in home and determine if 24 hr care is needed to continue.  Family asking for list of agencies for [facility] aides - notified Kristi with case management.  Daughter also reports she may know some options if needed.  Discussed main worry from therapy was due to unsteadiness, fall risk, and memory/dementia.      Pertinent Vitals/Pain Pain Assessment: No/denies pain    Home Living                      Prior Function            PT Goals (current goals can now be found in the care plan section) Acute Rehab PT Goals Patient Stated Goal: return home PT Goal Formulation: With patient Time For Goal Achievement: 11/21/19 Potential to Achieve Goals: Good Progress towards PT goals: Progressing toward goals    Frequency    Min 3X/week      PT Plan Current plan remains appropriate    Co-evaluation              AM-PAC PT "6 Clicks" Mobility   Outcome Measure  Help needed turning from your back to your side while in a flat bed without using bedrails?: None Help needed moving from lying on your back to sitting on the side of a flat bed without using bedrails?: None Help needed moving to and from a bed to a chair (including a wheelchair)?: None Help needed standing up from a chair using your arms (e.g., wheelchair or bedside chair)?: None Help needed to walk in hospital room?: A Little Help needed climbing 3-5 steps with a railing? : A Little 6 Click Score: 22    End of Session Equipment Utilized During Treatment: Gait belt Activity Tolerance: Patient tolerated treatment well Patient left: in bed;with call bell/phone within reach;with bed alarm  set;with family/visitor present Nurse Communication: Mobility status PT Visit Diagnosis: Unsteadiness on feet (R26.81);History of falling (Z91.81);Muscle weakness (generalized) (M62.81)     Time: 0092-3300 PT Time Calculation (min) (ACUTE ONLY): 43 min  Charges:  $Gait Training: 8-22 mins $Therapeutic Activity: 8-22 mins $Neuromuscular Re-education: 8-22 mins                     Abran Richard, PT Acute Rehab Services Pager [phone] [facility] [place] 11/08/2019, 3:07 PM

## 2019-11-09 LAB — TYPE AND SCREEN
ABO/RH(D): O POS
Antibody Screen: NEGATIVE
Unit division: 0
Unit division: 0

## 2019-11-09 LAB — BPAM RBC
Blood Product Expiration Date: 202110072359
Blood Product Expiration Date: 202110302359
ISSUE DATE / TIME: 202109291804
ISSUE DATE / TIME: 202110011136
Unit Type and Rh: 5100
Unit Type and Rh: 5100

## 2019-11-10 DIAGNOSIS — E538 Deficiency of other specified B group vitamins: Secondary | ICD-10-CM | POA: Diagnosis not present

## 2019-11-10 DIAGNOSIS — I129 Hypertensive chronic kidney disease with stage 1 through stage 4 chronic kidney disease, or unspecified chronic kidney disease: Secondary | ICD-10-CM | POA: Diagnosis not present

## 2019-11-10 DIAGNOSIS — I083 Combined rheumatic disorders of mitral, aortic and tricuspid valves: Secondary | ICD-10-CM | POA: Diagnosis not present

## 2019-11-10 DIAGNOSIS — D631 Anemia in chronic kidney disease: Secondary | ICD-10-CM | POA: Diagnosis not present

## 2019-11-10 DIAGNOSIS — M810 Age-related osteoporosis without current pathological fracture: Secondary | ICD-10-CM | POA: Diagnosis not present

## 2019-11-10 DIAGNOSIS — N1831 Chronic kidney disease, stage 3a: Secondary | ICD-10-CM | POA: Diagnosis not present

## 2019-11-10 DIAGNOSIS — M0600 Rheumatoid arthritis without rheumatoid factor, unspecified site: Secondary | ICD-10-CM | POA: Diagnosis not present

## 2019-11-10 DIAGNOSIS — D509 Iron deficiency anemia, unspecified: Secondary | ICD-10-CM | POA: Diagnosis not present

## 2019-11-10 DIAGNOSIS — E039 Hypothyroidism, unspecified: Secondary | ICD-10-CM | POA: Diagnosis not present

## 2019-11-10 DIAGNOSIS — I7300 Raynaud's syndrome without gangrene: Secondary | ICD-10-CM | POA: Diagnosis not present

## 2019-11-11 LAB — CULTURE, BLOOD (ROUTINE X 2)
Culture: NO GROWTH
Culture: NO GROWTH
Special Requests: ADEQUATE

## 2019-11-14 ENCOUNTER — Ambulatory Visit (INDEPENDENT_AMBULATORY_CARE_PROVIDER_SITE_OTHER): Payer: Medicare HMO | Admitting: Family Medicine

## 2019-11-14 ENCOUNTER — Other Ambulatory Visit: Payer: Self-pay

## 2019-11-14 ENCOUNTER — Encounter: Payer: Self-pay | Admitting: Family Medicine

## 2019-11-14 VITALS — BP 102/54 | HR 85 | Temp 96.8°F | Ht 63.0 in | Wt 114.1 lb

## 2019-11-14 DIAGNOSIS — R627 Adult failure to thrive: Secondary | ICD-10-CM | POA: Insufficient documentation

## 2019-11-14 DIAGNOSIS — I10 Essential (primary) hypertension: Secondary | ICD-10-CM

## 2019-11-14 DIAGNOSIS — R5382 Chronic fatigue, unspecified: Secondary | ICD-10-CM | POA: Diagnosis not present

## 2019-11-14 DIAGNOSIS — R109 Unspecified abdominal pain: Secondary | ICD-10-CM

## 2019-11-14 DIAGNOSIS — D509 Iron deficiency anemia, unspecified: Secondary | ICD-10-CM | POA: Diagnosis not present

## 2019-11-14 DIAGNOSIS — E538 Deficiency of other specified B group vitamins: Secondary | ICD-10-CM | POA: Diagnosis not present

## 2019-11-14 DIAGNOSIS — G8929 Other chronic pain: Secondary | ICD-10-CM

## 2019-11-14 DIAGNOSIS — R69 Illness, unspecified: Secondary | ICD-10-CM | POA: Diagnosis not present

## 2019-11-14 DIAGNOSIS — Z23 Encounter for immunization: Secondary | ICD-10-CM

## 2019-11-14 DIAGNOSIS — N179 Acute kidney failure, unspecified: Secondary | ICD-10-CM

## 2019-11-14 DIAGNOSIS — D638 Anemia in other chronic diseases classified elsewhere: Secondary | ICD-10-CM | POA: Diagnosis not present

## 2019-11-14 DIAGNOSIS — G479 Sleep disorder, unspecified: Secondary | ICD-10-CM | POA: Diagnosis not present

## 2019-11-14 DIAGNOSIS — E039 Hypothyroidism, unspecified: Secondary | ICD-10-CM

## 2019-11-14 DIAGNOSIS — F418 Other specified anxiety disorders: Secondary | ICD-10-CM

## 2019-11-14 DIAGNOSIS — K5901 Slow transit constipation: Secondary | ICD-10-CM

## 2019-11-14 LAB — CBC WITH DIFFERENTIAL/PLATELET
Basophils Absolute: 0.1 10*3/uL (ref 0.0–0.1)
Basophils Relative: 0.9 % (ref 0.0–3.0)
Eosinophils Absolute: 0.1 10*3/uL (ref 0.0–0.7)
Eosinophils Relative: 1 % (ref 0.0–5.0)
HCT: 28 % — ABNORMAL LOW (ref 36.0–46.0)
Hemoglobin: 8.8 g/dL — ABNORMAL LOW (ref 12.0–15.0)
Lymphocytes Relative: 14.3 % (ref 12.0–46.0)
Lymphs Abs: 1.1 10*3/uL (ref 0.7–4.0)
MCHC: 31.6 g/dL (ref 30.0–36.0)
MCV: 77.4 fl — ABNORMAL LOW (ref 78.0–100.0)
Monocytes Absolute: 1.1 10*3/uL — ABNORMAL HIGH (ref 0.1–1.0)
Monocytes Relative: 14.3 % — ABNORMAL HIGH (ref 3.0–12.0)
Neutro Abs: 5.2 10*3/uL (ref 1.4–7.7)
Neutrophils Relative %: 69.5 % (ref 43.0–77.0)
Platelets: 383 10*3/uL (ref 150.0–400.0)
RBC: 3.62 Mil/uL — ABNORMAL LOW (ref 3.87–5.11)
RDW: 24.2 % — ABNORMAL HIGH (ref 11.5–15.5)
WBC: 7.4 10*3/uL (ref 4.0–10.5)

## 2019-11-14 LAB — RENAL FUNCTION PANEL
Albumin: 2.7 g/dL — ABNORMAL LOW (ref 3.5–5.2)
BUN: 45 mg/dL — ABNORMAL HIGH (ref 6–23)
CO2: 24 mEq/L (ref 19–32)
Calcium: 8.8 mg/dL (ref 8.4–10.5)
Chloride: 104 mEq/L (ref 96–112)
Creatinine, Ser: 1.11 mg/dL (ref 0.40–1.20)
GFR: 42.9 mL/min — ABNORMAL LOW (ref >60.00)
Glucose, Bld: 132 mg/dL — ABNORMAL HIGH (ref 70–99)
Phosphorus: 3.4 mg/dL (ref 2.3–4.6)
Potassium: 4.6 mEq/L (ref 3.5–5.1)
Sodium: 139 mEq/L (ref 135–145)

## 2019-11-14 LAB — IRON: Iron: 52 ug/dL (ref 42–145)

## 2019-11-14 LAB — TSH: TSH: 0.28 u[IU]/mL — ABNORMAL LOW (ref 0.35–4.50)

## 2019-11-14 LAB — FERRITIN: Ferritin: >1500 ng/mL — ABNORMAL HIGH (ref 10.0–291.0)

## 2019-11-14 MED ORDER — MIRTAZAPINE 15 MG PO TABS: 7.5000 mg | ORAL_TABLET | Freq: Every day | ORAL | 0 refills | Status: AC

## 2019-11-14 NOTE — Assessment & Plan Note (Signed)
Lab Results  Component Value Date   VITAMINB12 302 11/06/2019   Now taking it more reliably/orally

## 2019-11-14 NOTE — Assessment & Plan Note (Signed)
Mirtazapine 15 was too sedating Trying 7.5 mg now and family will report back  Has a counselor coming to the house on Monday for grief rxn (loss of son)

## 2019-11-14 NOTE — Progress Notes (Signed)
Subjective:    Patient ID: Dawn Aguirre, female    DOB: March 28, 1925, 86 y.o.   MRN: [id number]  This visit occurred during the SARS-CoV-2 public health emergency.  Safety protocols were in place, including screening questions prior to the visit, additional usage of staff PPE, and extensive cleaning of exam room while observing appropriate contact time as indicated for disinfecting solutions.    HPI Pt presents for f/u of hospitalization 9/29 to 10/1 - for primary problem of anemia and acute kidney injury   Wt Readings from Last 3 Encounters:  11/14/19 114 lb 2 oz (51.8 kg)  11/06/19 114 lb 6.7 oz (51.9 kg)  06/22/19 136 lb 7.4 oz (61.9 kg)   20.22 kg/m   Hospital course as follows:  Acute microcytic anemia Anemia of chronic disease -No report of hematochezia, hematemesis or melena but chronic'stomach' pain. -suspect chronic GI bleeding related to diverticulosis.However stool is heme-negative at this time -GI consult appreciated.  EGD and colonoscopy recommended.  However patient is strongly and clearly denied despite multiple efforts to convince her by her family members. -Hemoglobin 7.1 this morning, likely diluted because of IV fluid. -1 more unit of PRBC given today.  Total 2 units during this hospitalization. -Patient will follow up with PCP as an outpatient and may need outpatient GI evaluation if continues to have anemia. Recent Labs (within last 365 days)          Recent Labs    06/22/19 1618 10/16/19 1516 11/06/19 1328 11/07/19 0117 11/07/19 1225 11/08/19 0300  HGB 8.2* 7.8 Repeated and verified X2.* 7.0* 7.3* 7.4* 7.1*       Vitamin B12 deficiency -Continue vitamin B12 supplement Recent Labs (within last 365 days)             Recent Labs    11/28/18 1135 01/22/19 1214 06/22/19 1618 10/16/19 1516 11/06/19 1328 11/06/19 1630 11/07/19 0117 11/07/19 1225 11/08/19 0300  MCV  --   --    < > 69.3 Repeated and verified X2.*   < >  --    < > 76.2*  75.5*  VITAMINB12 197* 130*  --  200*  --  302  --   --   --   FOLATE  --   --   --   --   --  7.5  --   --   --    < > = values in this interval not displayed.     Iron deficiency -Iron level 9, TIBC low. -IV iron given in the hospital.  Ferrous sulfate oral tablets started at discharge. Recent Labs (within last 365 days)         Recent Labs    11/06/19 1510 11/06/19 1630 11/07/19 0117 11/07/19 1225 11/08/19 0300  MCV  --   --    < > 76.2* 75.5*  FERRITIN  --  620*  --   --   --   TIBC  --  169*  --   --   --   IRON  --  9*  --   --   --   RETICCTPCT 0.6  --   --   --   --    < > = values in this interval not displayed.     Lab Results  Component Value Date   WBC 5.7 11/08/2019   HGB 7.1 (L) 11/08/2019   HCT 24.7 (L) 11/08/2019   MCV 75.5 (L) 11/08/2019   PLT 315 11/08/2019   Dr  Ardis Hughs from GI saw pt in hospital on 9/30  Noted heme negative  Noted pt did not want proceedures  Thought she may have anemia of chronic disease  Recommended miralax daily to bid  Planned IV iron  Adv f/u with Dr Earlean Shawl as prev planned   She got iron pills instead of using liquid  Taking once per day  ? If absorbing    AKI on CKD 3a -Baseline creatinine 1.09 in May, it was 1.31 3 weeks ago. -Presented with creatinine of 2.36. Secondary to poor oral intake. -Expect improvementin creatininewith hydration. -Continue normal saline at 100 mill per hour overnight. Recent Labs (within last 365 days)           Recent Labs    11/28/18 1135 01/22/19 1214 06/22/19 1618 10/16/19 1516 11/06/19 1328 11/07/19 0117 11/08/19 0300  BUN 19 20 24* 28* 70* 56* 30*  CREATININE 1.21* 1.08 1.09* 1.31* 2.36* 1.91* 1.35*     Last renal Lab Results  Component Value Date   CREATININE 1.35 (H) 11/08/2019   BUN 30 (H) 11/08/2019   NA 135 11/08/2019   K 4.4 11/08/2019   CL 107 11/08/2019   CO2 17 (L) 11/08/2019   Caregiver is making sure she drinks at home regularly  Also magic  milkshakes for nutrition    Hypotension History of essential hypertension -Initially hypotensive on admission likely because of dehydration. Currently blood pressure is on the low side of normal.  -Likely because of dehydration from poor oral intake. -No evidence of sepsis. Lactic acid normal. -Continue IV fluid. -On olmesartan/HCTZ 40 mg / 25 mg daily at home.  Currently it is on hold. -Continue to monitor blood pressure at home.  If blood exceeds 150/90, she can be resumed on it.  BP Readings from Last 3 Encounters:  11/14/19 (!) 102/54  11/08/19 (!) 120/44  10/16/19 (!) 126/56       Hyperkalemia -Likely secondary to renal failure. -Improved.  Last Labs        Recent Labs  Lab 11/06/19 1328 11/07/19 0117 11/08/19 0300  K 5.5* 4.7 4.4  MG  --  2.0  --   PHOS  --  3.9  --      Lab Results  Component Value Date   K 4.4 11/08/2019       Hypothyroidism -Continue Synthroid.   Lab Results  Component Value Date   TSH 0.316 (L) 11/06/2019   before  this on 9/8 it was 9.29 (and in dec was 11.42)  Had issues taking medication (week before hospital her caregiver took over medicines)    Generalized weakness -PT eval.    Poor appetite -Patient had been tried on Remeron 15 mg in the past by PCP.  Family stopped it because she was getting sleepy.  Can be tried on 7.5 mg at bedtime.  Now has a grief counselor coming on Monday -porch visit   Stable for discharge home today with home health PT   Today- feels lousy in general   Home PT  Now has weekend caregiver coming in   Eating is a struggle  Not much bm  Has a chairside toilet in place now   Lost a lot of wt  No appetite Tired and cold   Patient Active Problem List   Diagnosis Date Noted  . Adult failure to thrive 11/14/2019  . AKI (acute kidney injury) ([place]) 11/08/2019  . Anemia of chronic disease 11/06/2019  . Iron deficiency anemia 10/24/2019  . Vaginal  pain 06/20/2019  . Left  foot pain 01/22/2019  . Epistaxis 12/27/2018  . Hypertension, essential 10/06/2017  . Tricuspid regurgitation 12/09/2016  . Mitral regurgitation 12/09/2016  . Normochromic anemia 12/09/2016  . Chronic abdominal pain 10/18/2016  . Urine frequency 09/09/2016  . Sleep disorder 01/13/2016  . Female cystocele 12/30/2015  . Routine general medical examination at a health care facility 07/22/2015  . Hearing loss 07/22/2015  . Prediabetes 07/22/2015  . Depression with anxiety 04/24/2015  . Tremor 12/20/2014  . Chronic pain 10/14/2014  . B12 deficiency 07/16/2014  . Vitamin D deficiency 07/16/2014  . Fatigue 07/04/2014  . Encounter for Medicare annual wellness exam 01/13/2014  . Fall in home 12/24/2013  . History of breast cancer 12/18/2013  . Epiploic appendagitis 02/22/2013  . Dry mouth 02/04/2013  . Benign paroxysmal positional vertigo 09/14/2012  . Low back pain 09/07/2012  . Constipation 11/04/2011  . Aortic stenosis 08/31/2010  . Hyperlipidemia   . STRESS REACTION, ACUTE, WITH EMOTIONAL DISTURBANCE 10/12/2007  . [place] DISEASE, CERVICAL 10/12/2007  . Hypothyroidism 01/22/2007  . RAYNAUD'S SYNDROME 01/22/2007  . EXTERNAL HEMORRHOIDS 01/22/2007  . Diverticulosis of colon 01/22/2007  . FIBROCYSTIC BREAST DISEASE 01/22/2007  . Osteoporosis 01/22/2007  . GERD 12/16/2006  . DYSPNEA ON EXERTION 12/16/2006  . TOBACCO ABUSE, HX OF 12/16/2006  . HYSTERECTOMY, HX OF 12/16/2006   Past Medical History:  Diagnosis Date  . Anxiety   . Aortic stenosis    a. mild-mod by echo 2012.  . Cancer of left breast ([place])    surgery at [facility]  . Chronic airway obstruction, not elsewhere classified   . Colitis, ischemic ([place]) 07/2000   ? infect  . Degeneration of cervical intervertebral disc   . Diffuse cystic mastopathy   . Diverticulosis of colon (without mention of hemorrhage)   . Esophageal reflux   . Essential hypertension   . External hemorrhoids without mention of complication   . Fibula  fracture 2006  . Hyperlipidemia    a. intol of multiple statins.  . Lichen sclerosus    back/abd/vulvar  . Mastoiditis of right side 04/2005   admitted  . Mild mitral regurgitation 2012  . Moderate tricuspid regurgitation 2012  . Osteoporosis, unspecified   . Personal history of tobacco use, presenting hazards to health   . RA (rheumatoid arthritis) ([facility])    Sero-negative  . Raynaud's syndrome   . Spinal stenosis   . Stress at home    "caring for son who is disabled" (12/08/2016)  . Unspecified gastritis and gastroduodenitis without mention of hemorrhage   . Unspecified hypothyroidism    Past Surgical History:  Procedure Laterality Date  . ABDOMINAL HYSTERECTOMY  1968  . BREAST BIOPSY Left   . BREAST LUMPECTOMY Left   . Cardiollite  02/2002   Neg  . CATARACT EXTRACTION W/ INTRAOCULAR LENS IMPLANT Right   . COLONOSCOPY    . COLONOSCOPY    . DEXA  2001   OP,heel  . DEXA     OP hip T-2.94  . NASAL SINUS SURGERY  10/2002   Archie Endo 06/23/2010  . Nuclear Stress Test  11/07   Normal  . SHOULDER ARTHROSCOPY W/ ROTATOR CUFF REPAIR Right 06/2005   Archie Endo 06/23/2010  . TONSILLECTOMY    . US ECHOCARDIOGRAPHY  10/06   normal LVF EF 55-65%   Social History   Tobacco Use  . Smoking status: Former Smoker    Packs/day: 2.00    Years: 10.00    Pack years: 20.00  Types: Cigarettes    Quit date: 02/07/1962    Years since quitting: 57.8  . Smokeless tobacco: Never Used  Vaping Use  . Vaping Use: Never used  Substance Use Topics  . Alcohol use: Yes    Alcohol/week: 0.0 standard drinks    Comment: 12/08/2016 "glass of wine a couple times/month"  . Drug use: No   Family History  Problem Relation Age of Onset  . Heart attack Father 21  . Coronary artery disease Sister 26       CABG  . Heart attack Brother 88  . Coronary artery disease Other        GF   Allergies  Allergen Reactions  . Ace Inhibitors     REACTION: cough  . Dm-Apap-Cpm Nausea Only  . Ezetimibe      REACTION: myalgia  . Ezetimibe-Simvastatin     REACTION: joints swell  . Macrobid [Nitrofurantoin]     ABD PAIN, ALSO BODY ACHES/PAIN  . Moxifloxacin   . Rosuvastatin     REACTION: myalgia  . Septra [Sulfamethoxazole-Trimethoprim]     Sick feeling   . Simvastatin     REACTION: myalgia  . Triaminic    Current Outpatient Medications on File Prior to Visit  Medication Sig Dispense Refill  . cyanocobalamin ([facility] VITAMIN B12) 2000 MCG tablet Take 2,000 mcg by mouth daily.     . Ferrous Sulfate (IRON PO) Take by mouth.    . levothyroxine (SYNTHROID) 100 MCG tablet TAKE 1 TABLET BY MOUTH EVERY DAY (Patient taking differently: Take 100 mcg by mouth daily. ) 90 tablet 0  . mirtazapine (REMERON) 7.5 MG tablet Take 2 tablets (15 mg total) by mouth at bedtime. 60 tablet 0  . senna (SENOKOT) 8.6 MG TABS tablet Take 1 tablet (8.6 mg total) by mouth at bedtime as needed for mild constipation. 30 tablet 0  . ferrous sulfate 300 (60 Fe) MG/5ML syrup Take 5 mLs (300 mg total) by mouth daily. (Patient not taking: Reported on 11/14/2019) 150 mL 0   No current facility-administered medications on file prior to visit.    Review of Systems  Constitutional: Positive for fatigue. Negative for activity change, appetite change, fever and unexpected weight change.  HENT: Negative for congestion, ear pain, rhinorrhea, sinus pressure and sore throat.   Eyes: Negative for pain, redness and visual disturbance.  Respiratory: Negative for cough, shortness of breath and wheezing.   Cardiovascular: Negative for chest pain and palpitations.  Gastrointestinal: Positive for abdominal pain and constipation. Negative for blood in stool, diarrhea, nausea and vomiting.       Bloating with constipation -baseline  Endocrine: Negative for polydipsia and polyuria.  Genitourinary: Negative for dysuria, frequency and urgency.  Musculoskeletal: Negative for arthralgias, back pain and myalgias.  Skin: Negative for pallor and rash.    Allergic/Immunologic: Negative for environmental allergies.  Neurological: Negative for dizziness, syncope and headaches.  Hematological: Negative for adenopathy. Does not bruise/bleed easily.  Psychiatric/Behavioral: Positive for decreased concentration. Negative for dysphoric mood. The patient is nervous/anxious.        Objective:   Physical Exam Constitutional:      General: She is not in acute distress.    Appearance: Normal appearance. She is well-developed and normal weight.     Comments: Frail appearing  Significant weight loss noted   HENT:     Head: Normocephalic and atraumatic.  Eyes:     Conjunctiva/sclera: Conjunctivae normal.     Pupils: Pupils are equal, round, and reactive to  light.  Neck:     Thyroid: No thyromegaly.     Vascular: No carotid bruit or JVD.  Cardiovascular:     Rate and Rhythm: Normal rate and regular rhythm.     Heart sounds: Murmur heard.  No gallop.   Pulmonary:     Effort: Pulmonary effort is normal. No respiratory distress.     Breath sounds: Normal breath sounds. No wheezing or rales.  Abdominal:     General: Abdomen is flat. There is no abdominal bruit.  Musculoskeletal:     Cervical back: Normal range of motion and neck supple.     Right lower leg: No edema.     Left lower leg: No edema.  Lymphadenopathy:     Cervical: No cervical adenopathy.  Skin:    General: Skin is warm and dry.     Coloration: Skin is pale.     Findings: No rash.  Neurological:     Mental Status: She is alert.     Sensory: No sensory deficit.     Coordination: Coordination normal.     Deep Tendon Reflexes: Reflexes are normal and symmetric. Reflexes normal.  Psychiatric:        Mood and Affect: Mood is depressed.     Comments: Very tired but not somnolent Pleasant  Depressed mood  Cognition is slower            Assessment & Plan:   Problem List Items Addressed This Visit      Cardiovascular and Mediastinum   Hypertension, essential    BP  remains low after hospitalization for anemia  We will continue to hold her olmesartan/hctz 40/25 and monitor  Po intake is better  Needs to use caution getting up re: orthostatic changes  BP: (!) 102/54            Endocrine   Hypothyroidism    TSH today  Was not taking medicine regularly prior to her hospitalization  Adjust as needed  Now has someone admin her medications at home      Relevant Orders   TSH (Completed)     Genitourinary   AKI (acute kidney injury) ([place])    Due in part to not eating and drinking  Admission cr 2.36 Returned to 1.35 at d/c  Reviewed hospital records, lab results and studies in detail   Now eating and drinking much better at home  Labs today  Suspect this is partial cause of her anemia       Relevant Orders   Renal function panel (Completed)     Other   Constipation    Recommended miralax twice daily in addn to stool softener  Better fluid intake now  Will monitor as she progresses at home GI ref on hold currently , she is not interested in proceedures      Fatigue    Most likely cause is anemia (severe) in addition to chronic medical problems and failure to thrive Reviewed hospital records, lab results and studies in detail   Labs today incl cbc and tsh  Getting better nutrition  Heme ref made for anemia       B12 deficiency    Lab Results  Component Value Date   VITAMINB12 302 11/06/2019   Now taking it more reliably/orally      Depression with anxiety    Mirtazapine 15 was too sedating Trying 7.5 mg now and family will report back  Has a counselor coming to the house on Monday for grief  rxn (loss of son)       Relevant Medications   mirtazapine (REMERON) 15 MG tablet   Sleep disorder    Mirtazapine 15 caused too much sedation  Cut dose to 7.5  Family will alert [place] if this is also too sedating  Fall prevention discussed      Chronic abdominal pain    Suspect due to constipation  Will start miralax  GI ref on  hold  Getting better po intake       Relevant Medications   mirtazapine (REMERON) 15 MG tablet   Iron deficiency anemia - Primary    Pt has both anemia of chronic dz and iron def currently   (in setting of renal insufficiency) She is not interested in GI procedures at this time Reviewed hospital records, lab results and studies in detail   Has 1 u prbc and iron infusion in the hospital  Hb was still 7.1 at discharge  She is taking more nutrition and fluids now  Re check cbc, ferritin and iron levels  Ref made to hematology  Taking iron otc-family will call to give doses       Relevant Medications   Ferrous Sulfate (IRON PO)   Other Relevant Orders   CBC with Differential/Platelet (Completed)   Iron (Completed)   Ferritin (Completed)   Ambulatory referral to Hematology   Anemia of chronic disease    Pt has both anemia of chronic dz and iron def currently   (in setting of renal insufficiency) She is not interested in GI procedures at this time Reviewed hospital records, lab results and studies in detail   Has 1 u prbc and iron infusion in the hospital  Hb was still 7.1 at discharge  She is taking more nutrition and fluids now  Re check cbc, ferritin and iron levels  Ref made to hematology  Taking iron otc-family will call to give doses       Relevant Medications   Ferrous Sulfate (IRON PO)   Adult failure to thrive    Advanced age with severe anemia-recently hosp with dehydration and renal failure Reviewed hospital records, lab results and studies in detail  Doing better at home with atc care now and intake is better  Hoping mirtazapine will help depression and appetite Very frail  Did disc option of palliative care with pt and family- they will consider        Other Visit Diagnoses    Need for influenza vaccination       Relevant Orders   Flu Vaccine QUAD High Dose(Fluad) (Completed)

## 2019-11-14 NOTE — Assessment & Plan Note (Signed)
Pt has both anemia of chronic dz and iron def currently   (in setting of renal insufficiency) She is not interested in GI procedures at this time Reviewed hospital records, lab results and studies in detail   Has 1 u prbc and iron infusion in the hospital  Hb was still 7.1 at discharge  She is taking more nutrition and fluids now  Re check cbc, ferritin and iron levels  Ref made to hematology  Taking iron otc-family will call to give doses

## 2019-11-14 NOTE — Assessment & Plan Note (Signed)
Mirtazapine 15 caused too much sedation  Cut dose to 7.5  Family will alert [place] if this is also too sedating  Fall prevention discussed

## 2019-11-14 NOTE — Assessment & Plan Note (Signed)
Most likely cause is anemia (severe) in addition to chronic medical problems and failure to thrive Reviewed hospital records, lab results and studies in detail   Labs today incl cbc and tsh  Getting better nutrition  Heme ref made for anemia

## 2019-11-14 NOTE — Assessment & Plan Note (Signed)
Suspect due to constipation  Will start miralax  GI ref on hold  Getting better po intake

## 2019-11-14 NOTE — Assessment & Plan Note (Signed)
Advanced age with severe anemia-recently hosp with dehydration and renal failure Reviewed hospital records, lab results and studies in detail  Doing better at home with atc care now and intake is better  Hoping mirtazapine will help depression and appetite Very frail  Did disc option of palliative care with pt and family- they will consider

## 2019-11-14 NOTE — Assessment & Plan Note (Signed)
Recommended miralax twice daily in addn to stool softener  Better fluid intake now  Will monitor as she progresses at home GI ref on hold currently , she is not interested in proceedures

## 2019-11-14 NOTE — Patient Instructions (Addendum)
Stay off the blood pressure medicine and watch bp  We can re start it if bp gets above 150/90   Continue stool softener Drink fluids   Start miralax twice daily   Start grief counseling  Continue PT   Labs today   I will place a referral for hematology   Keep eating and drinking   Let me know what your iron dose is   Flu shot today

## 2019-11-14 NOTE — Assessment & Plan Note (Signed)
TSH today  Was not taking medicine regularly prior to her hospitalization  Adjust as needed  Now has someone admin her medications at home

## 2019-11-14 NOTE — Assessment & Plan Note (Signed)
Due in part to not eating and drinking  Admission cr 2.36 Returned to 1.35 at d/c  Reviewed hospital records, lab results and studies in detail   Now eating and drinking much better at home  Labs today  Suspect this is partial cause of her anemia

## 2019-11-14 NOTE — Assessment & Plan Note (Signed)
BP remains low after hospitalization for anemia  We will continue to hold her olmesartan/hctz 40/25 and monitor  Po intake is better  Needs to use caution getting up re: orthostatic changes  BP: (!) 102/54

## 2019-11-15 ENCOUNTER — Telehealth: Payer: Self-pay | Admitting: *Deleted

## 2019-11-15 DIAGNOSIS — I129 Hypertensive chronic kidney disease with stage 1 through stage 4 chronic kidney disease, or unspecified chronic kidney disease: Secondary | ICD-10-CM | POA: Diagnosis not present

## 2019-11-15 DIAGNOSIS — M5030 Other cervical disc degeneration, unspecified cervical region: Secondary | ICD-10-CM

## 2019-11-15 DIAGNOSIS — Z853 Personal history of malignant neoplasm of breast: Secondary | ICD-10-CM

## 2019-11-15 DIAGNOSIS — H538 Other visual disturbances: Secondary | ICD-10-CM

## 2019-11-15 DIAGNOSIS — H9193 Unspecified hearing loss, bilateral: Secondary | ICD-10-CM

## 2019-11-15 DIAGNOSIS — K219 Gastro-esophageal reflux disease without esophagitis: Secondary | ICD-10-CM

## 2019-11-15 DIAGNOSIS — N1831 Chronic kidney disease, stage 3a: Secondary | ICD-10-CM | POA: Diagnosis not present

## 2019-11-15 DIAGNOSIS — F419 Anxiety disorder, unspecified: Secondary | ICD-10-CM

## 2019-11-15 DIAGNOSIS — Z9181 History of falling: Secondary | ICD-10-CM

## 2019-11-15 DIAGNOSIS — E538 Deficiency of other specified B group vitamins: Secondary | ICD-10-CM

## 2019-11-15 DIAGNOSIS — E785 Hyperlipidemia, unspecified: Secondary | ICD-10-CM

## 2019-11-15 DIAGNOSIS — K559 Vascular disorder of intestine, unspecified: Secondary | ICD-10-CM

## 2019-11-15 DIAGNOSIS — D509 Iron deficiency anemia, unspecified: Secondary | ICD-10-CM | POA: Diagnosis not present

## 2019-11-15 DIAGNOSIS — E039 Hypothyroidism, unspecified: Secondary | ICD-10-CM

## 2019-11-15 DIAGNOSIS — I7300 Raynaud's syndrome without gangrene: Secondary | ICD-10-CM | POA: Diagnosis not present

## 2019-11-15 DIAGNOSIS — Z87891 Personal history of nicotine dependence: Secondary | ICD-10-CM

## 2019-11-15 DIAGNOSIS — M0600 Rheumatoid arthritis without rheumatoid factor, unspecified site: Secondary | ICD-10-CM

## 2019-11-15 DIAGNOSIS — I083 Combined rheumatic disorders of mitral, aortic and tricuspid valves: Secondary | ICD-10-CM | POA: Diagnosis not present

## 2019-11-15 DIAGNOSIS — K5730 Diverticulosis of large intestine without perforation or abscess without bleeding: Secondary | ICD-10-CM

## 2019-11-15 DIAGNOSIS — M4800 Spinal stenosis, site unspecified: Secondary | ICD-10-CM

## 2019-11-15 DIAGNOSIS — D631 Anemia in chronic kidney disease: Secondary | ICD-10-CM | POA: Diagnosis not present

## 2019-11-15 DIAGNOSIS — M810 Age-related osteoporosis without current pathological fracture: Secondary | ICD-10-CM

## 2019-11-15 MED ORDER — LEVOTHYROXINE SODIUM 88 MCG PO TABS: 88.0000 ug | ORAL_TABLET | Freq: Every day | ORAL | 3 refills | Status: AC

## 2019-11-15 NOTE — Telephone Encounter (Signed)
Daughter viewed results via mychart. I also advised her of Dr. Marliss Coots comments, Rx sent to pharmacy and daughter will call back to schedule lab appt

## 2019-11-15 NOTE — Telephone Encounter (Signed)
-----   Message from [name], MD sent at 11/14/2019  4:56 PM EDT ----- Labs look overall improved - blood count and iron level , Hb is up to 8.8  I need to change her levothyroxine from 100 mcg to 88 mcg daily #30 3 ref please send in  Will need TSH in about 4 weeks please  Keep working on eating and drinking

## 2019-11-18 ENCOUNTER — Telehealth: Payer: Self-pay | Admitting: Hematology and Oncology

## 2019-11-18 NOTE — Telephone Encounter (Signed)
Received a new hem referral for IDA. Ms. Hubbard has been scheduled to see Dr. Alvy Bimler on 11/2 at 1pm. Appt date and time has been given to the pt's daughter.

## 2019-11-19 DIAGNOSIS — I7300 Raynaud's syndrome without gangrene: Secondary | ICD-10-CM | POA: Diagnosis not present

## 2019-11-19 DIAGNOSIS — N1831 Chronic kidney disease, stage 3a: Secondary | ICD-10-CM | POA: Diagnosis not present

## 2019-11-19 DIAGNOSIS — E538 Deficiency of other specified B group vitamins: Secondary | ICD-10-CM | POA: Diagnosis not present

## 2019-11-19 DIAGNOSIS — E039 Hypothyroidism, unspecified: Secondary | ICD-10-CM | POA: Diagnosis not present

## 2019-11-19 DIAGNOSIS — D509 Iron deficiency anemia, unspecified: Secondary | ICD-10-CM | POA: Diagnosis not present

## 2019-11-19 DIAGNOSIS — M810 Age-related osteoporosis without current pathological fracture: Secondary | ICD-10-CM | POA: Diagnosis not present

## 2019-11-19 DIAGNOSIS — I129 Hypertensive chronic kidney disease with stage 1 through stage 4 chronic kidney disease, or unspecified chronic kidney disease: Secondary | ICD-10-CM | POA: Diagnosis not present

## 2019-11-19 DIAGNOSIS — D631 Anemia in chronic kidney disease: Secondary | ICD-10-CM | POA: Diagnosis not present

## 2019-11-19 DIAGNOSIS — I083 Combined rheumatic disorders of mitral, aortic and tricuspid valves: Secondary | ICD-10-CM | POA: Diagnosis not present

## 2019-11-19 DIAGNOSIS — M0600 Rheumatoid arthritis without rheumatoid factor, unspecified site: Secondary | ICD-10-CM | POA: Diagnosis not present

## 2019-11-20 DIAGNOSIS — I7300 Raynaud's syndrome without gangrene: Secondary | ICD-10-CM | POA: Diagnosis not present

## 2019-11-20 DIAGNOSIS — M0600 Rheumatoid arthritis without rheumatoid factor, unspecified site: Secondary | ICD-10-CM | POA: Diagnosis not present

## 2019-11-20 DIAGNOSIS — D509 Iron deficiency anemia, unspecified: Secondary | ICD-10-CM | POA: Diagnosis not present

## 2019-11-20 DIAGNOSIS — E039 Hypothyroidism, unspecified: Secondary | ICD-10-CM | POA: Diagnosis not present

## 2019-11-20 DIAGNOSIS — D631 Anemia in chronic kidney disease: Secondary | ICD-10-CM | POA: Diagnosis not present

## 2019-11-20 DIAGNOSIS — M810 Age-related osteoporosis without current pathological fracture: Secondary | ICD-10-CM | POA: Diagnosis not present

## 2019-11-20 DIAGNOSIS — I083 Combined rheumatic disorders of mitral, aortic and tricuspid valves: Secondary | ICD-10-CM | POA: Diagnosis not present

## 2019-11-20 DIAGNOSIS — E538 Deficiency of other specified B group vitamins: Secondary | ICD-10-CM | POA: Diagnosis not present

## 2019-11-20 DIAGNOSIS — I129 Hypertensive chronic kidney disease with stage 1 through stage 4 chronic kidney disease, or unspecified chronic kidney disease: Secondary | ICD-10-CM | POA: Diagnosis not present

## 2019-11-20 DIAGNOSIS — N1831 Chronic kidney disease, stage 3a: Secondary | ICD-10-CM | POA: Diagnosis not present

## 2019-11-23 ENCOUNTER — Inpatient Hospital Stay (HOSPITAL_COMMUNITY)
Admission: EM | Admit: 2019-11-23 | Discharge: 2019-11-25 | DRG: 183 | Disposition: A | Discharge status: Hospice | Payer: Medicare HMO | Attending: Internal Medicine | Admitting: Internal Medicine

## 2019-11-23 ENCOUNTER — Emergency Department (HOSPITAL_COMMUNITY): Payer: Medicare HMO

## 2019-11-23 ENCOUNTER — Other Ambulatory Visit: Payer: Self-pay

## 2019-11-23 ENCOUNTER — Encounter (HOSPITAL_COMMUNITY): Payer: Self-pay | Admitting: Emergency Medicine

## 2019-11-23 DIAGNOSIS — I959 Hypotension, unspecified: Secondary | ICD-10-CM | POA: Diagnosis not present

## 2019-11-23 DIAGNOSIS — M4802 Spinal stenosis, cervical region: Secondary | ICD-10-CM | POA: Diagnosis present

## 2019-11-23 DIAGNOSIS — E860 Dehydration: Secondary | ICD-10-CM | POA: Diagnosis present

## 2019-11-23 DIAGNOSIS — F418 Other specified anxiety disorders: Secondary | ICD-10-CM | POA: Diagnosis present

## 2019-11-23 DIAGNOSIS — R109 Unspecified abdominal pain: Secondary | ICD-10-CM | POA: Diagnosis not present

## 2019-11-23 DIAGNOSIS — W19XXXA Unspecified fall, initial encounter: Secondary | ICD-10-CM

## 2019-11-23 DIAGNOSIS — R102 Pelvic and perineal pain: Secondary | ICD-10-CM | POA: Diagnosis not present

## 2019-11-23 DIAGNOSIS — I899 Noninfective disorder of lymphatic vessels and lymph nodes, unspecified: Secondary | ICD-10-CM

## 2019-11-23 DIAGNOSIS — Z882 Allergy status to sulfonamides status: Secondary | ICD-10-CM

## 2019-11-23 DIAGNOSIS — I898 Other specified noninfective disorders of lymphatic vessels and lymph nodes: Secondary | ICD-10-CM | POA: Diagnosis not present

## 2019-11-23 DIAGNOSIS — M545 Low back pain, unspecified: Secondary | ICD-10-CM | POA: Diagnosis not present

## 2019-11-23 DIAGNOSIS — D631 Anemia in chronic kidney disease: Secondary | ICD-10-CM | POA: Diagnosis present

## 2019-11-23 DIAGNOSIS — Z888 Allergy status to other drugs, medicaments and biological substances status: Secondary | ICD-10-CM

## 2019-11-23 DIAGNOSIS — D519 Vitamin B12 deficiency anemia, unspecified: Secondary | ICD-10-CM | POA: Diagnosis present

## 2019-11-23 DIAGNOSIS — M810 Age-related osteoporosis without current pathological fracture: Secondary | ICD-10-CM | POA: Diagnosis present

## 2019-11-23 DIAGNOSIS — Y92019 Unspecified place in single-family (private) house as the place of occurrence of the external cause: Secondary | ICD-10-CM

## 2019-11-23 DIAGNOSIS — D638 Anemia in other chronic diseases classified elsewhere: Secondary | ICD-10-CM | POA: Diagnosis present

## 2019-11-23 DIAGNOSIS — I129 Hypertensive chronic kidney disease with stage 1 through stage 4 chronic kidney disease, or unspecified chronic kidney disease: Secondary | ICD-10-CM | POA: Diagnosis present

## 2019-11-23 DIAGNOSIS — Z79890 Hormone replacement therapy (postmenopausal): Secondary | ICD-10-CM

## 2019-11-23 DIAGNOSIS — S3991XA Unspecified injury of abdomen, initial encounter: Secondary | ICD-10-CM | POA: Diagnosis not present

## 2019-11-23 DIAGNOSIS — S2242XA Multiple fractures of ribs, left side, initial encounter for closed fracture: Secondary | ICD-10-CM | POA: Diagnosis not present

## 2019-11-23 DIAGNOSIS — M069 Rheumatoid arthritis, unspecified: Secondary | ICD-10-CM | POA: Diagnosis present

## 2019-11-23 DIAGNOSIS — Z66 Do not resuscitate: Secondary | ICD-10-CM | POA: Diagnosis present

## 2019-11-23 DIAGNOSIS — K5730 Diverticulosis of large intestine without perforation or abscess without bleeding: Secondary | ICD-10-CM | POA: Diagnosis present

## 2019-11-23 DIAGNOSIS — I081 Rheumatic disorders of both mitral and tricuspid valves: Secondary | ICD-10-CM | POA: Diagnosis present

## 2019-11-23 DIAGNOSIS — K219 Gastro-esophageal reflux disease without esophagitis: Secondary | ICD-10-CM | POA: Diagnosis present

## 2019-11-23 DIAGNOSIS — N183 Chronic kidney disease, stage 3 unspecified: Secondary | ICD-10-CM

## 2019-11-23 DIAGNOSIS — J449 Chronic obstructive pulmonary disease, unspecified: Secondary | ICD-10-CM | POA: Diagnosis present

## 2019-11-23 DIAGNOSIS — H9190 Unspecified hearing loss, unspecified ear: Secondary | ICD-10-CM | POA: Diagnosis present

## 2019-11-23 DIAGNOSIS — I2510 Atherosclerotic heart disease of native coronary artery without angina pectoris: Secondary | ICD-10-CM | POA: Diagnosis present

## 2019-11-23 DIAGNOSIS — Z6820 Body mass index (BMI) 20.0-20.9, adult: Secondary | ICD-10-CM

## 2019-11-23 DIAGNOSIS — F0390 Unspecified dementia without behavioral disturbance: Secondary | ICD-10-CM | POA: Diagnosis present

## 2019-11-23 DIAGNOSIS — S0990XA Unspecified injury of head, initial encounter: Secondary | ICD-10-CM | POA: Diagnosis not present

## 2019-11-23 DIAGNOSIS — E785 Hyperlipidemia, unspecified: Secondary | ICD-10-CM | POA: Diagnosis present

## 2019-11-23 DIAGNOSIS — N1831 Chronic kidney disease, stage 3a: Secondary | ICD-10-CM | POA: Diagnosis present

## 2019-11-23 DIAGNOSIS — Z881 Allergy status to other antibiotic agents status: Secondary | ICD-10-CM

## 2019-11-23 DIAGNOSIS — I10 Essential (primary) hypertension: Secondary | ICD-10-CM | POA: Diagnosis present

## 2019-11-23 DIAGNOSIS — Z20822 Contact with and (suspected) exposure to covid-19: Secondary | ICD-10-CM | POA: Diagnosis present

## 2019-11-23 DIAGNOSIS — Z853 Personal history of malignant neoplasm of breast: Secondary | ICD-10-CM

## 2019-11-23 DIAGNOSIS — Y92009 Unspecified place in unspecified non-institutional (private) residence as the place of occurrence of the external cause: Secondary | ICD-10-CM

## 2019-11-23 DIAGNOSIS — Z87891 Personal history of nicotine dependence: Secondary | ICD-10-CM

## 2019-11-23 DIAGNOSIS — E875 Hyperkalemia: Secondary | ICD-10-CM | POA: Diagnosis present

## 2019-11-23 DIAGNOSIS — K5900 Constipation, unspecified: Secondary | ICD-10-CM | POA: Diagnosis not present

## 2019-11-23 DIAGNOSIS — E43 Unspecified severe protein-calorie malnutrition: Secondary | ICD-10-CM | POA: Diagnosis present

## 2019-11-23 DIAGNOSIS — E872 Acidosis: Secondary | ICD-10-CM | POA: Diagnosis present

## 2019-11-23 DIAGNOSIS — M5030 Other cervical disc degeneration, unspecified cervical region: Secondary | ICD-10-CM | POA: Diagnosis present

## 2019-11-23 DIAGNOSIS — M79662 Pain in left lower leg: Secondary | ICD-10-CM | POA: Diagnosis not present

## 2019-11-23 DIAGNOSIS — I7300 Raynaud's syndrome without gangrene: Secondary | ICD-10-CM | POA: Diagnosis present

## 2019-11-23 DIAGNOSIS — Z043 Encounter for examination and observation following other accident: Secondary | ICD-10-CM | POA: Diagnosis not present

## 2019-11-23 DIAGNOSIS — G8929 Other chronic pain: Secondary | ICD-10-CM | POA: Diagnosis present

## 2019-11-23 DIAGNOSIS — W010XXA Fall on same level from slipping, tripping and stumbling without subsequent striking against object, initial encounter: Secondary | ICD-10-CM | POA: Diagnosis present

## 2019-11-23 DIAGNOSIS — Z79899 Other long term (current) drug therapy: Secondary | ICD-10-CM

## 2019-11-23 DIAGNOSIS — Z8249 Family history of ischemic heart disease and other diseases of the circulatory system: Secondary | ICD-10-CM

## 2019-11-23 DIAGNOSIS — S3992XA Unspecified injury of lower back, initial encounter: Secondary | ICD-10-CM | POA: Diagnosis not present

## 2019-11-23 DIAGNOSIS — R627 Adult failure to thrive: Secondary | ICD-10-CM | POA: Diagnosis present

## 2019-11-23 DIAGNOSIS — E039 Hypothyroidism, unspecified: Secondary | ICD-10-CM | POA: Diagnosis present

## 2019-11-23 LAB — CBC WITH DIFFERENTIAL/PLATELET
Abs Immature Granulocytes: 0.06 10*3/uL (ref 0.00–0.07)
Basophils Absolute: 0 10*3/uL (ref 0.0–0.1)
Basophils Relative: 0 %
Eosinophils Absolute: 0.1 10*3/uL (ref 0.0–0.5)
Eosinophils Relative: 1 %
HCT: 28.3 % — ABNORMAL LOW (ref 36.0–46.0)
Hemoglobin: 8.3 g/dL — ABNORMAL LOW (ref 12.0–15.0)
Immature Granulocytes: 1 %
Lymphocytes Relative: 11 %
Lymphs Abs: 1 10*3/uL (ref 0.7–4.0)
MCH: 24.6 pg — ABNORMAL LOW (ref 26.0–34.0)
MCHC: 29.3 g/dL — ABNORMAL LOW (ref 30.0–36.0)
MCV: 84 fL (ref 80.0–100.0)
Monocytes Absolute: 0.9 10*3/uL (ref 0.1–1.0)
Monocytes Relative: 10 %
Neutro Abs: 6.9 10*3/uL (ref 1.7–7.7)
Neutrophils Relative %: 77 %
Platelets: 390 10*3/uL (ref 150–400)
RBC: 3.37 MIL/uL — ABNORMAL LOW (ref 3.87–5.11)
RDW: 21.5 % — ABNORMAL HIGH (ref 11.5–15.5)
WBC: 9 10*3/uL (ref 4.0–10.5)
nRBC: 0 % (ref 0.0–0.2)

## 2019-11-23 LAB — I-STAT CHEM 8, ED
BUN: 42 mg/dL — ABNORMAL HIGH (ref 8–23)
Calcium, Ion: 1.19 mmol/L (ref 1.15–1.40)
Chloride: 104 mmol/L (ref 98–111)
Creatinine, Ser: 1.2 mg/dL — ABNORMAL HIGH (ref 0.44–1.00)
Glucose, Bld: 163 mg/dL — ABNORMAL HIGH (ref 70–99)
HCT: 25 % — ABNORMAL LOW (ref 36.0–46.0)
Hemoglobin: 8.5 g/dL — ABNORMAL LOW (ref 12.0–15.0)
Potassium: 4.6 mmol/L (ref 3.5–5.1)
Sodium: 136 mmol/L (ref 135–145)
TCO2: 19 mmol/L — ABNORMAL LOW (ref 22–32)

## 2019-11-23 LAB — URINALYSIS, ROUTINE W REFLEX MICROSCOPIC
Bilirubin Urine: NEGATIVE
Glucose, UA: NEGATIVE mg/dL
Hgb urine dipstick: NEGATIVE
Ketones, ur: NEGATIVE mg/dL
Leukocytes,Ua: NEGATIVE
Nitrite: NEGATIVE
Protein, ur: NEGATIVE mg/dL
Specific Gravity, Urine: 1.039 — ABNORMAL HIGH (ref 1.005–1.030)
pH: 5 (ref 5.0–8.0)

## 2019-11-23 LAB — BASIC METABOLIC PANEL
Anion gap: 12 (ref 5–15)
BUN: 48 mg/dL — ABNORMAL HIGH (ref 8–23)
CO2: 19 mmol/L — ABNORMAL LOW (ref 22–32)
Calcium: 8.5 mg/dL — ABNORMAL LOW (ref 8.9–10.3)
Chloride: 103 mmol/L (ref 98–111)
Creatinine, Ser: 1.24 mg/dL — ABNORMAL HIGH (ref 0.44–1.00)
GFR, Estimated: 38 mL/min — ABNORMAL LOW (ref >60)
Glucose, Bld: 167 mg/dL — ABNORMAL HIGH (ref 70–99)
Potassium: 4.6 mmol/L (ref 3.5–5.1)
Sodium: 134 mmol/L — ABNORMAL LOW (ref 135–145)

## 2019-11-23 LAB — RESPIRATORY PANEL BY RT PCR (FLU A&B, COVID)
Influenza A by PCR: NEGATIVE
Influenza B by PCR: NEGATIVE
SARS Coronavirus 2 by RT PCR: NEGATIVE

## 2019-11-23 MED ORDER — ONDANSETRON HCL 4 MG/2ML IJ SOLN: 4.0000 mg | Freq: Four times a day (QID) | INTRAMUSCULAR | Status: DC | PRN

## 2019-11-23 MED ORDER — FERROUS SULFATE 300 (60 FE) MG/5ML PO SYRP
300.0000 mg | ORAL_SOLUTION | Freq: Every day | ORAL | Status: DC
  Administered 2019-11-25: 300 mg via ORAL
  Filled 2019-11-23 (×2): qty 5

## 2019-11-23 MED ORDER — ENOXAPARIN SODIUM 30 MG/0.3ML ~~LOC~~ SOLN
30.0000 mg | SUBCUTANEOUS | Status: DC
  Administered 2019-11-23 – 2019-11-24 (×2): 30 mg via SUBCUTANEOUS
  Filled 2019-11-23 (×2): qty 0.3

## 2019-11-23 MED ORDER — ONDANSETRON HCL 4 MG PO TABS: 4.0000 mg | ORAL_TABLET | Freq: Four times a day (QID) | ORAL | Status: DC | PRN

## 2019-11-23 MED ORDER — ACETAMINOPHEN 325 MG PO TABS
650.0000 mg | ORAL_TABLET | Freq: Four times a day (QID) | ORAL | Status: DC | PRN
  Administered 2019-11-24: 650 mg via ORAL
  Filled 2019-11-23: qty 2

## 2019-11-23 MED ORDER — LORAZEPAM 2 MG/ML IJ SOLN
1.0000 mg | Freq: Once | INTRAMUSCULAR | Status: AC | PRN
  Administered 2019-11-23: 1 mg via INTRAVENOUS
  Filled 2019-11-23: qty 1

## 2019-11-23 MED ORDER — ONDANSETRON HCL 4 MG/2ML IJ SOLN
4.0000 mg | Freq: Once | INTRAMUSCULAR | Status: AC
  Administered 2019-11-23: 4 mg via INTRAVENOUS
  Filled 2019-11-23: qty 2

## 2019-11-23 MED ORDER — MIRTAZAPINE 15 MG PO TABS
7.5000 mg | ORAL_TABLET | Freq: Every day | ORAL | Status: DC
  Filled 2019-11-23: qty 1

## 2019-11-23 MED ORDER — SENNOSIDES-DOCUSATE SODIUM 8.6-50 MG PO TABS: 1.0000 | ORAL_TABLET | Freq: Every evening | ORAL | Status: DC | PRN

## 2019-11-23 MED ORDER — LEVOTHYROXINE SODIUM 88 MCG PO TABS: 88.0000 ug | ORAL_TABLET | Freq: Every day | ORAL | Status: DC

## 2019-11-23 MED ORDER — IOHEXOL 300 MG/ML  SOLN
75.0000 mL | Freq: Once | INTRAMUSCULAR | Status: AC | PRN
  Administered 2019-11-23: 75 mL via INTRAVENOUS

## 2019-11-23 MED ORDER — ACETAMINOPHEN 650 MG RE SUPP: 650.0000 mg | Freq: Four times a day (QID) | RECTAL | Status: DC | PRN

## 2019-11-23 MED ORDER — VITAMIN B-12 1000 MCG PO TABS
2000.0000 ug | ORAL_TABLET | Freq: Every day | ORAL | Status: DC
  Administered 2019-11-25: 2000 ug via ORAL
  Filled 2019-11-23 (×3): qty 2

## 2019-11-23 MED ORDER — HYDROCODONE-ACETAMINOPHEN 5-325 MG PO TABS
1.0000 | ORAL_TABLET | ORAL | Status: DC | PRN
  Administered 2019-11-25 (×2): 1 via ORAL
  Administered 2019-11-25: 2 via ORAL
  Filled 2019-11-23: qty 1
  Filled 2019-11-23: qty 2
  Filled 2019-11-23 (×2): qty 1

## 2019-11-23 MED ORDER — SODIUM CHLORIDE 0.9 % IV BOLUS
500.0000 mL | Freq: Once | INTRAVENOUS | Status: AC
  Administered 2019-11-23: 500 mL via INTRAVENOUS

## 2019-11-23 MED ORDER — SODIUM CHLORIDE (PF) 0.9 % IJ SOLN
INTRAMUSCULAR | Status: AC
  Filled 2019-11-23: qty 50

## 2019-11-23 MED ORDER — LACTATED RINGERS IV SOLN: INTRAVENOUS | Status: AC

## 2019-11-23 MED ORDER — HYDROMORPHONE HCL 1 MG/ML IJ SOLN
1.0000 mg | INTRAMUSCULAR | Status: DC | PRN
  Administered 2019-11-24 (×2): 1 mg via INTRAVENOUS
  Filled 2019-11-23 (×2): qty 1

## 2019-11-23 MED ORDER — MORPHINE SULFATE (PF) 2 MG/ML IV SOLN
2.0000 mg | Freq: Once | INTRAVENOUS | Status: AC
  Administered 2019-11-23: 2 mg via INTRAVENOUS
  Filled 2019-11-23: qty 1

## 2019-11-23 MED ORDER — MORPHINE SULFATE (PF) 2 MG/ML IV SOLN: 2.0000 mg | INTRAVENOUS | Status: DC | PRN

## 2019-11-23 NOTE — ED Provider Notes (Signed)
[place] DEPT Provider Note   CSN: [id number] Arrival date & time: 11/23/19  1509     History Chief Complaint  Patient presents with   Lytle Michaels    Dawn Aguirre is a 86 y.o. female.  86 year old female brought in by EMS from nursing home for left side pain after a fall. Patient states she fell a few nights ago while standing at the sink, states she does not recall exactly what day, denies feeling weak or dizzy prior to the fall, denies hitting her head or LOC. Patient states she does not recall how she got off the floor, on her own or with assistance, states the next thing she can recall is not being on the floor. Patient is not anticoagulated, denies chest pain, abdominal pain, changes in bowel or bladder habits. Reports movement makes her pain much worse. No other injuries or concerns.   Per EMS, patient fell last night while transferring from bedside commode.         Past Medical History:  Diagnosis Date   Anxiety    Aortic stenosis    a. mild-mod by echo 2012.   Cancer of left breast [facility])    surgery at [facility]   Chronic airway obstruction, not elsewhere classified    Colitis, ischemic ([place]) 07/2000   ? infect   Degeneration of cervical intervertebral disc    Diffuse cystic mastopathy    Diverticulosis of colon (without mention of hemorrhage)    Esophageal reflux    Essential hypertension    External hemorrhoids without mention of complication    Fibula fracture 2006   Hyperlipidemia    a. intol of multiple statins.   Lichen sclerosus    back/abd/vulvar   Mastoiditis of right side 04/2005   admitted   Mild mitral regurgitation 2012   Moderate tricuspid regurgitation 2012   Osteoporosis, unspecified    Personal history of tobacco use, presenting hazards to health    RA (rheumatoid arthritis) ([place])    Sero-negative   Raynaud's syndrome    Spinal stenosis    Stress at home    "caring for son who is  disabled" (12/08/2016)   Unspecified gastritis and gastroduodenitis without mention of hemorrhage    Unspecified hypothyroidism     Patient Active Problem List   Diagnosis Date Noted   Adult failure to thrive 11/14/2019   AKI (acute kidney injury) ([place]) 11/08/2019   Anemia of chronic disease 11/06/2019   Iron deficiency anemia 10/24/2019   Vaginal pain 06/20/2019   Left foot pain 01/22/2019   Epistaxis 12/27/2018   Hypertension, essential 10/06/2017   Tricuspid regurgitation 12/09/2016   Mitral regurgitation 12/09/2016   Normochromic anemia 12/09/2016   Chronic abdominal pain 10/18/2016   Urine frequency 09/09/2016   Sleep disorder 01/13/2016   Female cystocele 12/30/2015   Routine general medical examination at a health care facility 07/22/2015   Hearing loss 07/22/2015   Prediabetes 07/22/2015   Depression with anxiety 04/24/2015   Tremor 12/20/2014   Chronic pain 10/14/2014   B12 deficiency 07/16/2014   Vitamin D deficiency 07/16/2014   Fatigue 07/04/2014   Encounter for Medicare annual wellness exam 01/13/2014   Fall in home 12/24/2013   History of breast cancer 12/18/2013   Epiploic appendagitis 02/22/2013   Dry mouth 02/04/2013   Benign paroxysmal positional vertigo 09/14/2012   Low back pain 09/07/2012   Constipation 11/04/2011   Aortic stenosis 08/31/2010   Hyperlipidemia    STRESS REACTION, ACUTE, WITH  EMOTIONAL DISTURBANCE 10/12/2007   [place] DISEASE, CERVICAL 10/12/2007   Hypothyroidism 01/22/2007   RAYNAUD'S SYNDROME 01/22/2007   EXTERNAL HEMORRHOIDS 01/22/2007   Diverticulosis of colon 01/22/2007   FIBROCYSTIC BREAST DISEASE 01/22/2007   Osteoporosis 01/22/2007   GERD 12/16/2006   DYSPNEA ON EXERTION 12/16/2006   TOBACCO ABUSE, HX OF 12/16/2006   HYSTERECTOMY, HX OF 12/16/2006    Past Surgical History:  Procedure Laterality Date   ABDOMINAL HYSTERECTOMY  1968   BREAST BIOPSY Left    BREAST  LUMPECTOMY Left    Cardiollite  02/2002   Neg   CATARACT EXTRACTION W/ INTRAOCULAR LENS IMPLANT Right    COLONOSCOPY     COLONOSCOPY     DEXA  2001   OP,heel   DEXA     OP hip T-2.94   NASAL SINUS SURGERY  10/2002   Archie Endo 06/23/2010   Nuclear Stress Test  11/07   Normal   SHOULDER ARTHROSCOPY W/ ROTATOR CUFF REPAIR Right 06/2005   Archie Endo 06/23/2010   TONSILLECTOMY     US ECHOCARDIOGRAPHY  10/06   normal LVF EF 55-65%     OB History   No obstetric history on file.     Family History  Problem Relation Age of Onset   Heart attack Father 39   Coronary artery disease Sister 12       CABG   Heart attack Brother 11   Coronary artery disease Other        GF    Social History   Tobacco Use   Smoking status: Former Smoker    Packs/day: 2.00    Years: 10.00    Pack years: 20.00    Types: Cigarettes    Quit date: 02/07/1962    Years since quitting: 57.8   Smokeless tobacco: Never Used  Vaping Use   Vaping Use: Never used  Substance Use Topics   Alcohol use: Yes    Alcohol/week: 0.0 standard drinks    Comment: 12/08/2016 "glass of wine a couple times/month"   Drug use: No    Home Medications Prior to Admission medications   Medication Sig Start Date End Date Taking? Authorizing Provider  cyanocobalamin ([employer] VITAMIN B12) 2000 MCG tablet Take 2,000 mcg by mouth daily.     [provider]  Ferrous Sulfate (IRON PO) Take by mouth.    [provider]  ferrous sulfate 300 (60 Fe) MG/5ML syrup Take 5 mLs (300 mg total) by mouth daily. Patient not taking: Reported on 11/14/2019 11/08/19 12/08/19  Terrilee Croak, MD  levothyroxine (SYNTHROID) 88 MCG tablet Take 1 tablet (88 mcg total) by mouth daily before breakfast. 11/15/19   Tower, Wynelle Fanny, MD  mirtazapine (REMERON) 15 MG tablet Take 0.5 tablets (7.5 mg total) by mouth at bedtime. 11/14/19   Tower, Wynelle Fanny, MD  mirtazapine (REMERON) 7.5 MG tablet Take 2 tablets (15 mg total) by mouth at  bedtime. 11/08/19 12/08/19  Terrilee Croak, MD  senna (SENOKOT) 8.6 MG TABS tablet Take 1 tablet (8.6 mg total) by mouth at bedtime as needed for mild constipation. 11/08/19 12/08/19  Terrilee Croak, MD    Allergies    Ace inhibitors, Dm-apap-cpm, Ezetimibe, Ezetimibe-simvastatin, Macrobid [nitrofurantoin], Moxifloxacin, Rosuvastatin, Septra [sulfamethoxazole-trimethoprim], Simvastatin, and Triaminic  Review of Systems   Review of Systems  Constitutional: Negative for fever.  Respiratory: Negative for shortness of breath.   Cardiovascular: Negative for chest pain.  Gastrointestinal: Negative for abdominal pain, constipation, diarrhea, nausea and vomiting.  Genitourinary: Negative for dysuria and frequency.  Musculoskeletal: Positive for back pain. Negative for neck pain.  Skin: Negative for rash and wound.  Allergic/Immunologic: Negative for immunocompromised state.  Neurological: Negative for dizziness and weakness.  Psychiatric/Behavioral: Positive for confusion.    Physical Exam Updated Vital Signs BP 132/61    Pulse 75    Temp (!) 97.5 F (36.4 C) (Oral)    Resp 16    SpO2 100%   Physical Exam Vitals and nursing note reviewed.  Constitutional:      General: She is not in acute distress.    Appearance: She is well-developed. She is not diaphoretic.  HENT:     Head: Normocephalic and atraumatic.     Mouth/Throat:     Mouth: Mucous membranes are moist.  Eyes:     Extraocular Movements: Extraocular movements intact.     Pupils: Pupils are equal, round, and reactive to light.  Cardiovascular:     Rate and Rhythm: Normal rate and regular rhythm.     Pulses: Normal pulses.     Heart sounds: Normal heart sounds.  Pulmonary:     Effort: Pulmonary effort is normal.     Breath sounds: Normal breath sounds.  Chest:     Chest wall: Tenderness present.    Abdominal:     Palpations: Abdomen is soft.     Tenderness: There is no abdominal tenderness.  Musculoskeletal:         General: Tenderness present.     Cervical back: Normal range of motion and neck supple. No tenderness.     Right lower leg: No edema.     Left lower leg: No edema.     Comments: No pain with palpation or ROM upper extremities. Mild tenderness left lower leg, no pain in left ankle or with ROM left knee or hip. Pelvis is stable and non tender. TTP low back as well as left ribs midaxillary to posteriorly with tenderness in the thoracic back. Exam limited as patient is unable to roll over or sit up secondary to pain. No pain with palpation or ROM of the neck.   Skin:    General: Skin is warm and dry.     Findings: No bruising, erythema or rash.  Neurological:     Mental Status: She is alert and oriented to person, place, and time.     Sensory: No sensory deficit.     Motor: No weakness.  Psychiatric:        Behavior: Behavior normal.     ED Results / Procedures / Treatments   Labs (all labs ordered are listed, but only abnormal results are displayed) Labs Reviewed  CBC WITH DIFFERENTIAL/PLATELET - Abnormal; Notable for the following components:      Result Value   RBC 3.37 (*)    Hemoglobin 8.3 (*)    HCT 28.3 (*)    MCH 24.6 (*)    MCHC 29.3 (*)    RDW 21.5 (*)    All other components within normal limits  BASIC METABOLIC PANEL - Abnormal; Notable for the following components:   Sodium 134 (*)    CO2 19 (*)    Glucose, Bld 167 (*)    BUN 48 (*)    Creatinine, Ser 1.24 (*)    Calcium 8.5 (*)    GFR, Estimated 38 (*)    All other components within normal limits  I-STAT CHEM 8, ED - Abnormal; Notable for the following components:   BUN 42 (*)    Creatinine, Ser 1.20 (*)  Glucose, Bld 163 (*)    TCO2 19 (*)    Hemoglobin 8.5 (*)    HCT 25.0 (*)    All other components within normal limits  RESPIRATORY PANEL BY RT PCR (FLU A&B, COVID)  URINALYSIS, ROUTINE W REFLEX MICROSCOPIC    EKG None  Radiology DG Tibia/Fibula Left  Result Date: 11/23/2019 CLINICAL DATA:  Fall,  pain EXAM: LEFT TIBIA AND FIBULA - 2 VIEW COMPARISON:  None. FINDINGS: No acute bony abnormality. Specifically, no fracture, subluxation, or dislocation. Soft tissues are unremarkable. IMPRESSION: No acute bony abnormality. Electronically Signed   By: Rolm Baptise M.D.   On: 11/23/2019 16:57   CT Head Wo Contrast  Result Date: 11/23/2019 CLINICAL DATA:  Head trauma.  Fall last night. EXAM: CT HEAD WITHOUT CONTRAST CT CERVICAL SPINE WITHOUT CONTRAST TECHNIQUE: Multidetector CT imaging of the head and cervical spine was performed following the standard protocol without intravenous contrast. Multiplanar CT image reconstructions of the cervical spine were also generated. COMPARISON:  None. FINDINGS: CT HEAD FINDINGS Brain: There is no evidence of an acute cortically based infarct, intracranial hemorrhage, mass, midline shift, or extra-axial fluid collection. Patchy hypodensities in the cerebral white matter bilaterally are nonspecific but compatible with moderate chronic small vessel ischemic disease. A punctate infarct is suspected in the left caudate nucleus, presumably chronic with this history. Mild cerebral atrophy is within normal limits for age. Vascular: Calcified atherosclerosis at the skull base. No hyperdense vessel. Skull: No fracture is identified with the skull vertex incompletely imaged. Sinuses/Orbits: Minimal mucosal thickening in the left sphenoid and ethmoid sinuses. Clear mastoid air cells. Right cataract extraction. Other: None. CT CERVICAL SPINE FINDINGS Alignment: Grade 1 anterolisthesis of C3 on C4, C4 on C5, T1 on T2, T2 on T3, and T3 on T4 and grade 1 retrolisthesis of C5 on C6, all likely degenerative. Skull base and vertebrae: No acute fracture or suspicious osseous lesion. Soft tissues and spinal canal: No prevertebral fluid or swelling. No visible canal hematoma. Disc levels: Advanced disc degeneration at C5-6 greater than C6-7. Severe facet arthrosis bilaterally at C3-4 and on the  left at C2-3 and C4-5. Moderate to severe neural foraminal stenosis on the right at C3-4 and on the left at C4-5, C5-6, and C6-7. Moderate spinal stenosis at C5-6. Upper chest: Reported separately. Other: Mild calcific atherosclerosis at the left greater than right carotid bifurcations. IMPRESSION: 1. No evidence of acute intracranial abnormality. 2. Moderate chronic small vessel ischemic disease. 3. No acute cervical spine fracture. 4. Advanced cervical disc and facet degeneration. Electronically Signed   By: Logan Bores M.D.   On: 11/23/2019 17:03   CT Chest W Contrast  Result Date: 11/23/2019 CLINICAL DATA:  Acute pain due to trauma. Left chest wall and back pain. EXAM: CT CHEST, ABDOMEN AND PELVIS WITHOUT CONTRAST CT LUMBAR SPINE WITHOUT CONTRAST TECHNIQUE: Multidetector CT imaging of the abdomen and pelvis was performed following the standard protocol without IV contrast. Multiplanar CT images of the lumbar spine were reconstructed from contemporary CT of the Abdomen, and Pelvis COMPARISON:  December 09, 2016 CT of the chest. FINDINGS: CHEST Cardiovascular: The heart is mildly enlarged. There is a trace pericardial effusion. Atherosclerotic changes are noted of the thoracic aorta without evidence for dissection or aneurysm. There are coronary artery calcifications. There is no large centrally located pulmonary embolism. Mediastinum/Nodes: --there is a necrotic appearing enlarged precarinal lymph node measuring approximately 1.4 cm in the short axis (axial series 4, image 21). There is an enlarged lymph  node along the course of the esophagus measuring approximately 1.3 cm (axial series 4, image 22). There is a pathologically enlarged lymph node in the upper mediastinum measuring approximately 1.9 cm in the short axis (axial series 4, image 9). -- No hilar lymphadenopathy. -- No axillary lymphadenopathy. --there are pathologically enlarged supraclavicular lymph nodes measuring approximately 1.5 cm (axial  series 4, image 4). -- Normal thyroid gland where visualized. -  Unremarkable esophagus. Lungs/Pleura: There is a trace left-sided pleural effusion, otherwise the lungs are mostly clear without evidence for pneumothorax or large focal infiltrate. The trachea is unremarkable. Musculoskeletal: There is a soft tissue nodule measuring approximately 1.2 cm in the superior left breast (axial series 4, image 26). This is new since 2018. There are nondisplaced fractures involving the posterior tenth through twelfth ribs on the left. ABDOMEN AND PELVIS Hepatobiliary: The liver is normal. The gallbladder is not well appreciated and may be surgically absent.There is no biliary ductal dilation. Pancreas: Normal contours without ductal dilatation. No peripancreatic fluid collection. Spleen: Unremarkable. Adrenals/Urinary Tract: --Adrenal glands: Unremarkable. --Right kidney/ureter: No hydronephrosis or radiopaque kidney stones. --Left kidney/ureter: No hydronephrosis or radiopaque kidney stones. --Urinary bladder: Unremarkable. Stomach/Bowel: --Stomach/Duodenum: The stomach is mildly distended with an air-fluid level. --Small bowel: Unremarkable. --Colon: There are scattered colonic diverticula without CT evidence for diverticulitis. There is a large amount of stool in the colon. --Appendix: Not visualized. No right lower quadrant inflammation or free fluid. Vascular/Lymphatic: Atherosclerotic calcification is present within the non-aneurysmal abdominal aorta, without hemodynamically significant stenosis. --a few enlarged retroperitoneal lymph nodes are noted measuring up to approximately 1.3 cm in the short axis. --multiple pathologically enlarged mesenteric lymph nodes are noted primarily in the left mid abdomen. For example there is a nodal mass measuring approximately 3.8 x 3.7 cm (axial series 4, image 76). --mildly enlarged inguinal lymph nodes are noted. Reproductive: Status post hysterectomy. No adnexal mass. Other: No  ascites or free air. The abdominal wall is normal. Musculoskeletal. There is no acute displaced fracture of the lumbar spine. Advanced multilevel degenerative changes are noted of the lumbar spine. IMPRESSION: 1. Nondisplaced fractures involving the posterior tenth through twelfth ribs on the left. No pneumothorax. 2. Trace left-sided pleural effusion. 3. No acute displaced fracture of the lumbar spine. 4. There is a 1.2 cm soft tissue nodule in the superior left breast. This is new since 2018. While this could represent a benign process, a primary breast malignancy is not excluded. Follow-up with outpatient mammography is recommended. 5. Multiple pathologically enlarged lymph nodes are noted in the chest and abdomen concerning for a lymphoproliferative disorder versus metastatic disease. The lymph nodes in the left supraclavicular region are readily amenable to ultrasound-guided percutaneous biopsy if clinically indicated. 6. Large amount of stool in the colon. Aortic Atherosclerosis (ICD10-I70.0). Electronically Signed   By: Constance Holster M.D.   On: 11/23/2019 17:09   CT Cervical Spine Wo Contrast  Result Date: 11/23/2019 CLINICAL DATA:  Head trauma.  Fall last night. EXAM: CT HEAD WITHOUT CONTRAST CT CERVICAL SPINE WITHOUT CONTRAST TECHNIQUE: Multidetector CT imaging of the head and cervical spine was performed following the standard protocol without intravenous contrast. Multiplanar CT image reconstructions of the cervical spine were also generated. COMPARISON:  None. FINDINGS: CT HEAD FINDINGS Brain: There is no evidence of an acute cortically based infarct, intracranial hemorrhage, mass, midline shift, or extra-axial fluid collection. Patchy hypodensities in the cerebral white matter bilaterally are nonspecific but compatible with moderate chronic small vessel ischemic disease. A punctate infarct  is suspected in the left caudate nucleus, presumably chronic with this history. Mild cerebral atrophy is  within normal limits for age. Vascular: Calcified atherosclerosis at the skull base. No hyperdense vessel. Skull: No fracture is identified with the skull vertex incompletely imaged. Sinuses/Orbits: Minimal mucosal thickening in the left sphenoid and ethmoid sinuses. Clear mastoid air cells. Right cataract extraction. Other: None. CT CERVICAL SPINE FINDINGS Alignment: Grade 1 anterolisthesis of C3 on C4, C4 on C5, T1 on T2, T2 on T3, and T3 on T4 and grade 1 retrolisthesis of C5 on C6, all likely degenerative. Skull base and vertebrae: No acute fracture or suspicious osseous lesion. Soft tissues and spinal canal: No prevertebral fluid or swelling. No visible canal hematoma. Disc levels: Advanced disc degeneration at C5-6 greater than C6-7. Severe facet arthrosis bilaterally at C3-4 and on the left at C2-3 and C4-5. Moderate to severe neural foraminal stenosis on the right at C3-4 and on the left at C4-5, C5-6, and C6-7. Moderate spinal stenosis at C5-6. Upper chest: Reported separately. Other: Mild calcific atherosclerosis at the left greater than right carotid bifurcations. IMPRESSION: 1. No evidence of acute intracranial abnormality. 2. Moderate chronic small vessel ischemic disease. 3. No acute cervical spine fracture. 4. Advanced cervical disc and facet degeneration. Electronically Signed   By: Logan Bores M.D.   On: 11/23/2019 17:03   CT ABDOMEN PELVIS W CONTRAST  Result Date: 11/23/2019 CLINICAL DATA:  Acute pain due to trauma. Left chest wall and back pain. EXAM: CT CHEST, ABDOMEN AND PELVIS WITHOUT CONTRAST CT LUMBAR SPINE WITHOUT CONTRAST TECHNIQUE: Multidetector CT imaging of the abdomen and pelvis was performed following the standard protocol without IV contrast. Multiplanar CT images of the lumbar spine were reconstructed from contemporary CT of the Abdomen, and Pelvis COMPARISON:  December 09, 2016 CT of the chest. FINDINGS: CHEST Cardiovascular: The heart is mildly enlarged. There is a trace  pericardial effusion. Atherosclerotic changes are noted of the thoracic aorta without evidence for dissection or aneurysm. There are coronary artery calcifications. There is no large centrally located pulmonary embolism. Mediastinum/Nodes: --there is a necrotic appearing enlarged precarinal lymph node measuring approximately 1.4 cm in the short axis (axial series 4, image 21). There is an enlarged lymph node along the course of the esophagus measuring approximately 1.3 cm (axial series 4, image 22). There is a pathologically enlarged lymph node in the upper mediastinum measuring approximately 1.9 cm in the short axis (axial series 4, image 9). -- No hilar lymphadenopathy. -- No axillary lymphadenopathy. --there are pathologically enlarged supraclavicular lymph nodes measuring approximately 1.5 cm (axial series 4, image 4). -- Normal thyroid gland where visualized. -  Unremarkable esophagus. Lungs/Pleura: There is a trace left-sided pleural effusion, otherwise the lungs are mostly clear without evidence for pneumothorax or large focal infiltrate. The trachea is unremarkable. Musculoskeletal: There is a soft tissue nodule measuring approximately 1.2 cm in the superior left breast (axial series 4, image 26). This is new since 2018. There are nondisplaced fractures involving the posterior tenth through twelfth ribs on the left. ABDOMEN AND PELVIS Hepatobiliary: The liver is normal. The gallbladder is not well appreciated and may be surgically absent.There is no biliary ductal dilation. Pancreas: Normal contours without ductal dilatation. No peripancreatic fluid collection. Spleen: Unremarkable. Adrenals/Urinary Tract: --Adrenal glands: Unremarkable. --Right kidney/ureter: No hydronephrosis or radiopaque kidney stones. --Left kidney/ureter: No hydronephrosis or radiopaque kidney stones. --Urinary bladder: Unremarkable. Stomach/Bowel: --Stomach/Duodenum: The stomach is mildly distended with an air-fluid level. --Small  bowel: Unremarkable. --Colon: There  are scattered colonic diverticula without CT evidence for diverticulitis. There is a large amount of stool in the colon. --Appendix: Not visualized. No right lower quadrant inflammation or free fluid. Vascular/Lymphatic: Atherosclerotic calcification is present within the non-aneurysmal abdominal aorta, without hemodynamically significant stenosis. --a few enlarged retroperitoneal lymph nodes are noted measuring up to approximately 1.3 cm in the short axis. --multiple pathologically enlarged mesenteric lymph nodes are noted primarily in the left mid abdomen. For example there is a nodal mass measuring approximately 3.8 x 3.7 cm (axial series 4, image 76). --mildly enlarged inguinal lymph nodes are noted. Reproductive: Status post hysterectomy. No adnexal mass. Other: No ascites or free air. The abdominal wall is normal. Musculoskeletal. There is no acute displaced fracture of the lumbar spine. Advanced multilevel degenerative changes are noted of the lumbar spine. IMPRESSION: 1. Nondisplaced fractures involving the posterior tenth through twelfth ribs on the left. No pneumothorax. 2. Trace left-sided pleural effusion. 3. No acute displaced fracture of the lumbar spine. 4. There is a 1.2 cm soft tissue nodule in the superior left breast. This is new since 2018. While this could represent a benign process, a primary breast malignancy is not excluded. Follow-up with outpatient mammography is recommended. 5. Multiple pathologically enlarged lymph nodes are noted in the chest and abdomen concerning for a lymphoproliferative disorder versus metastatic disease. The lymph nodes in the left supraclavicular region are readily amenable to ultrasound-guided percutaneous biopsy if clinically indicated. 6. Large amount of stool in the colon. Aortic Atherosclerosis (ICD10-I70.0). Electronically Signed   By: Constance Holster M.D.   On: 11/23/2019 17:09   CT L-SPINE NO CHARGE  Result Date:  11/23/2019 CLINICAL DATA:  Acute pain due to trauma. Left chest wall and back pain. EXAM: CT CHEST, ABDOMEN AND PELVIS WITHOUT CONTRAST CT LUMBAR SPINE WITHOUT CONTRAST TECHNIQUE: Multidetector CT imaging of the abdomen and pelvis was performed following the standard protocol without IV contrast. Multiplanar CT images of the lumbar spine were reconstructed from contemporary CT of the Abdomen, and Pelvis COMPARISON:  December 09, 2016 CT of the chest. FINDINGS: CHEST Cardiovascular: The heart is mildly enlarged. There is a trace pericardial effusion. Atherosclerotic changes are noted of the thoracic aorta without evidence for dissection or aneurysm. There are coronary artery calcifications. There is no large centrally located pulmonary embolism. Mediastinum/Nodes: --there is a necrotic appearing enlarged precarinal lymph node measuring approximately 1.4 cm in the short axis (axial series 4, image 21). There is an enlarged lymph node along the course of the esophagus measuring approximately 1.3 cm (axial series 4, image 22). There is a pathologically enlarged lymph node in the upper mediastinum measuring approximately 1.9 cm in the short axis (axial series 4, image 9). -- No hilar lymphadenopathy. -- No axillary lymphadenopathy. --there are pathologically enlarged supraclavicular lymph nodes measuring approximately 1.5 cm (axial series 4, image 4). -- Normal thyroid gland where visualized. -  Unremarkable esophagus. Lungs/Pleura: There is a trace left-sided pleural effusion, otherwise the lungs are mostly clear without evidence for pneumothorax or large focal infiltrate. The trachea is unremarkable. Musculoskeletal: There is a soft tissue nodule measuring approximately 1.2 cm in the superior left breast (axial series 4, image 26). This is new since 2018. There are nondisplaced fractures involving the posterior tenth through twelfth ribs on the left. ABDOMEN AND PELVIS Hepatobiliary: The liver is normal. The  gallbladder is not well appreciated and may be surgically absent.There is no biliary ductal dilation. Pancreas: Normal contours without ductal dilatation. No peripancreatic fluid collection. Spleen:  Unremarkable. Adrenals/Urinary Tract: --Adrenal glands: Unremarkable. --Right kidney/ureter: No hydronephrosis or radiopaque kidney stones. --Left kidney/ureter: No hydronephrosis or radiopaque kidney stones. --Urinary bladder: Unremarkable. Stomach/Bowel: --Stomach/Duodenum: The stomach is mildly distended with an air-fluid level. --Small bowel: Unremarkable. --Colon: There are scattered colonic diverticula without CT evidence for diverticulitis. There is a large amount of stool in the colon. --Appendix: Not visualized. No right lower quadrant inflammation or free fluid. Vascular/Lymphatic: Atherosclerotic calcification is present within the non-aneurysmal abdominal aorta, without hemodynamically significant stenosis. --a few enlarged retroperitoneal lymph nodes are noted measuring up to approximately 1.3 cm in the short axis. --multiple pathologically enlarged mesenteric lymph nodes are noted primarily in the left mid abdomen. For example there is a nodal mass measuring approximately 3.8 x 3.7 cm (axial series 4, image 76). --mildly enlarged inguinal lymph nodes are noted. Reproductive: Status post hysterectomy. No adnexal mass. Other: No ascites or free air. The abdominal wall is normal. Musculoskeletal. There is no acute displaced fracture of the lumbar spine. Advanced multilevel degenerative changes are noted of the lumbar spine. IMPRESSION: 1. Nondisplaced fractures involving the posterior tenth through twelfth ribs on the left. No pneumothorax. 2. Trace left-sided pleural effusion. 3. No acute displaced fracture of the lumbar spine. 4. There is a 1.2 cm soft tissue nodule in the superior left breast. This is new since 2018. While this could represent a benign process, a primary breast malignancy is not excluded.  Follow-up with outpatient mammography is recommended. 5. Multiple pathologically enlarged lymph nodes are noted in the chest and abdomen concerning for a lymphoproliferative disorder versus metastatic disease. The lymph nodes in the left supraclavicular region are readily amenable to ultrasound-guided percutaneous biopsy if clinically indicated. 6. Large amount of stool in the colon. Aortic Atherosclerosis (ICD10-I70.0). Electronically Signed   By: Constance Holster M.D.   On: 11/23/2019 17:09    Procedures Procedures (including critical care time)  Medications Ordered in ED Medications  sodium chloride (PF) 0.9 % injection (has no administration in time range)  sodium chloride 0.9 % bolus 500 mL (has no administration in time range)  sodium chloride 0.9 % bolus 500 mL (0 mLs Intravenous Stopped 11/23/19 1710)  morphine 2 MG/ML injection 2 mg (2 mg Intravenous Given 11/23/19 1606)  ondansetron (ZOFRAN) injection 4 mg (4 mg Intravenous Given 11/23/19 1606)  iohexol (OMNIPAQUE) 300 MG/ML solution 75 mL (75 mLs Intravenous Contrast Given 11/23/19 1619)    ED Course  I have reviewed the triage vital signs and the nursing notes.  Pertinent labs & imaging results that were available during my care of the patient were reviewed by me and considered in my medical decision making (see chart for details).  Clinical Course as of Nov 22 1828  Sat Nov 23, 2283  [id number] 86 year old female brought in by EMS from home. Patient's son is at bedside on reassessment and provides a more clear history. Per son, patient was ambulating to the powder room around 7PM last night when she fell, he was there to assist her back up, no LOC.  Patient lives with her 79 year old husband and between caretakers and local family has care in the home 24 hours a day.  Patient was hospitalized 3 weeks ago with AKI/dehydration. On exam, patient was found to have left chest wall pain, pain in her upper and lower back as well as left  lower leg. CT head, c-spine, chest/abdomen/pelvis/L-spine and XR left lower leg obtained. Patient was found to have 3 fractured ribs, left posterior  10-12. Also found to have multiple pathologic lymph nodes (history of breast cancer).  Patient's blood pressure has remained low after multiple recordings and 532mL bolus. BP currently 98/54, additional fluids ordered. Suspect fall possibly due to orthostatic changes. Labs reviewed, CBC with hgb 8.3, Cr 1.24, no significant changes from recent hospitalization. Discussed plan with Dr. Jeanell Sparrow, ER attending as well as patient and son at bedside. Plan is to consult for admission for pain management and blood pressure monitoring.    [LM]  1830 Case discussed with Dr. Posey Pronto with [facility] who will consult for admission.    [LM]    Clinical Course User Index [LM] Roque Lias   MDM Rules/Calculators/A&P                          Final Clinical Impression(s) / ED Diagnoses Final diagnoses:  Closed fracture of multiple ribs of left side, initial encounter  Disease of lymph node  Hypotension, unspecified hypotension type    Rx / DC Orders ED Discharge Orders    None       Tacy Learn, PA-C 11/23/19 1830    Pattricia Boss, MD 11/23/19 2028

## 2019-11-23 NOTE — H&P (Signed)
History and Physical    Dawn DISS WPY:[id number] DOB: 12-17-1925 DOA: 11/23/2019  PCP: [name], MD  Patient coming from: Home  I have personally briefly reviewed patient's old medical records in [place]  Chief Complaint: Left-sided rib pain after fall at home.  HPI: Dawn Aguirre is a 86 y.o. female with medical history significant for HTN, HLD, hypothyroidism, CKD stage III, anemia of chronic disease and iron/B12 deficiency, GERD, depression/anxiety, and left-sided breast cancer who presents to the ED for evaluation after a fall at home.  Patient son is at bedside to provide additional history.  Patient states she was in her father yesterday morning when she slipped and fell onto her left side.  She says she had some discomfort at her lower left ribs but it was not very significant until this morning.  She has been having worsening left lower rib pain which has been making it difficult for her to ambulate.  Pain was unrelieved with Tylenol and worsens with minimal movement or deep inspiration.  She denied hitting her head, loss of consciousness, chest pain, abdominal pain, dysuria, diarrhea.  Patient recently admitted 11/06/2019-11/08/2019 for anemia requiring transfusion of 2 unit PRBCs.  Per discharge summary, patient declined further GI evaluation at that time.  Patient has been using a walker recently for assist with ambulation.  She has been working with home health therapy as well.  ED Course:  Initial vitals showed BP 98/54, pulse 77, RR 17, temp 97.5 Fahrenheit, SPO2 95% on room air.  Labs show WBC 9.0, hemoglobin 8.3, platelets 390,000, sodium 134, potassium 4.6, bicarb 19, BUN 48, creatinine 1.24, serum glucose 167.  Urinalysis is ordered and pending.  Respiratory PCR panel was ordered and pending.  CT head and cervical spine without contrast are negative for evidence of acute intracranial abnormality or acute cervical spine fracture.  Moderate chronic  small vessel ischemic disease and advanced cervical disc and facet degeneration are noted.  CT chest/abdomen/pelvis with contrast and CT lumbar spine without contrast showed nondisplaced fractures involving the posterior 10th through 12th ribs on the left without pneumothorax.  Trace left-sided pleural effusion noted.  No acute displaced fracture of the L-spine seen.  A 1.2 cm soft tissue nodule in the superior left breast is noted, new since 2018.  Multiple pathologically enlarged lymph nodes are noted in the chest and abdomen concerning for lymphoma proliferative disorder versus metastatic disease.  Large amount of stool in the colon also noted.  Left tibia-fibula x-ray was negative for acute bony abnormality.  Patient was given 500 cc normal saline bolus x2, IV morphine 2 mg, and IV Zofran.  The hospitalist service was consulted to admit for further evaluation and management.  Review of Systems: All systems reviewed and are negative except as documented in history of present illness above.   Past Medical History:  Diagnosis Date  . Anxiety   . Aortic stenosis    a. mild-mod by echo 2012.  . Cancer of left breast ([place])    surgery at [facility]  . Chronic airway obstruction, not elsewhere classified   . Colitis, ischemic ([place]) 07/2000   ? infect  . Degeneration of cervical intervertebral disc   . Diffuse cystic mastopathy   . Diverticulosis of colon (without mention of hemorrhage)   . Esophageal reflux   . Essential hypertension   . External hemorrhoids without mention of complication   . Fibula fracture 2006  . Hyperlipidemia    a. intol of multiple statins.  Marland Kitchen  Lichen sclerosus    back/abd/vulvar  . Mastoiditis of right side 04/2005   admitted  . Mild mitral regurgitation 2012  . Moderate tricuspid regurgitation 2012  . Osteoporosis, unspecified   . Personal history of tobacco use, presenting hazards to health   . RA (rheumatoid arthritis) ([facility])    Sero-negative  . Raynaud's  syndrome   . Spinal stenosis   . Stress at home    "caring for son who is disabled" (12/08/2016)  . Unspecified gastritis and gastroduodenitis without mention of hemorrhage   . Unspecified hypothyroidism     Past Surgical History:  Procedure Laterality Date  . ABDOMINAL HYSTERECTOMY  1968  . BREAST BIOPSY Left   . BREAST LUMPECTOMY Left   . Cardiollite  02/2002   Neg  . CATARACT EXTRACTION W/ INTRAOCULAR LENS IMPLANT Right   . COLONOSCOPY    . COLONOSCOPY    . DEXA  2001   OP,heel  . DEXA     OP hip T-2.94  . NASAL SINUS SURGERY  10/2002   Archie Endo 06/23/2010  . Nuclear Stress Test  11/07   Normal  . SHOULDER ARTHROSCOPY W/ ROTATOR CUFF REPAIR Right 06/2005   Archie Endo 06/23/2010  . TONSILLECTOMY    . US ECHOCARDIOGRAPHY  10/06   normal LVF EF 55-65%    Social History:  reports that she quit smoking about 57 years ago. Her smoking use included cigarettes. She has a 20.00 pack-year smoking history. She has never used smokeless tobacco. She reports current alcohol use. She reports that she does not use drugs.  Allergies  Allergen Reactions  . Ace Inhibitors     REACTION: cough  . Dm-Apap-Cpm Nausea Only  . Ezetimibe     REACTION: myalgia  . Ezetimibe-Simvastatin     REACTION: joints swell  . Macrobid [Nitrofurantoin]     ABD PAIN, ALSO BODY ACHES/PAIN  . Moxifloxacin   . Rosuvastatin     REACTION: myalgia  . Septra [Sulfamethoxazole-Trimethoprim]     Sick feeling   . Simvastatin     REACTION: myalgia  . Triaminic     Family History  Problem Relation Age of Onset  . Heart attack Father 9  . Coronary artery disease Sister 47       CABG  . Heart attack Brother 59  . Coronary artery disease Other        GF     Prior to Admission medications   Medication Sig Start Date End Date Taking? Authorizing Provider  cyanocobalamin ([facility] VITAMIN B12) 2000 MCG tablet Take 2,000 mcg by mouth daily.     [provider]  Ferrous Sulfate (IRON PO) Take by mouth.     [provider]  ferrous sulfate 300 (60 Fe) MG/5ML syrup Take 5 mLs (300 mg total) by mouth daily. Patient not taking: Reported on 11/14/2019 11/08/19 12/08/19  Terrilee Croak, MD  levothyroxine (SYNTHROID) 88 MCG tablet Take 1 tablet (88 mcg total) by mouth daily before breakfast. 11/15/19   Tower, Wynelle Fanny, MD  mirtazapine (REMERON) 15 MG tablet Take 0.5 tablets (7.5 mg total) by mouth at bedtime. 11/14/19   Tower, Wynelle Fanny, MD  mirtazapine (REMERON) 7.5 MG tablet Take 2 tablets (15 mg total) by mouth at bedtime. 11/08/19 12/08/19  Terrilee Croak, MD  senna (SENOKOT) 8.6 MG TABS tablet Take 1 tablet (8.6 mg total) by mouth at bedtime as needed for mild constipation. 11/08/19 12/08/19  Terrilee Croak, MD    Physical Exam: Vitals:   11/23/19 6222  11/23/19 1811  BP: (!) 98/54 132/61  Pulse: 77 75  Resp: 17 16  Temp: (!) 97.5 F (36.4 C) (!) 97.5 F (36.4 C)  TempSrc: Oral Oral  SpO2: 95% 100%  Exam limited due to pain from rib fractures. Constitutional: Elderly woman resting supine in bed, appears uncomfortable and anxious Eyes: PERRL, lids and conjunctivae normal ENMT: Mucous membranes are moist. Posterior pharynx clear of any exudate or lesions.Normal dentition.  Neck: normal, supple, no masses. Respiratory: clear to auscultation anteriorly.  Normal respiratory effort. No accessory muscle use.  Cardiovascular: Regular rate and rhythm, systolic murmur present. No extremity edema. 2+ pedal pulses. Abdomen: Mild suprapubic tenderness, no masses palpated. No hepatosplenomegaly. Bowel sounds positive.  Musculoskeletal: no clubbing / cyanosis. No joint deformity upper and lower extremities.  Limited ability to turn or sit up due to significant pain from rib fractures. Skin: no rashes, lesions, ulcers. No induration Neurologic: CN 2-12 grossly intact. Sensation intact, strength limited due to pain from rib fractures otherwise intact. Psychiatric: Normal judgment and insight. Alert and  oriented x 3.  Anxious mood and perseverating on pain with movement.  Labs on Admission: I have personally reviewed following labs and imaging studies  CBC: Recent Labs  Lab 11/23/19 1536 11/23/19 1556  WBC 9.0  --   NEUTROABS 6.9  --   HGB 8.3* 8.5*  HCT 28.3* 25.0*  MCV 84.0  --   PLT 390  --    Basic Metabolic Panel: Recent Labs  Lab 11/23/19 1536 11/23/19 1556  NA 134* 136  K 4.6 4.6  CL 103 104  CO2 19*  --   GLUCOSE 167* 163*  BUN 48* 42*  CREATININE 1.24* 1.20*  CALCIUM 8.5*  --    GFR: Estimated Creatinine Clearance: 24.5 mL/min (A) (by C-G formula based on SCr of 1.2 mg/dL (H)). Liver Function Tests: No results for input(s): AST, ALT, ALKPHOS, BILITOT, PROT, ALBUMIN in the last 168 hours. No results for input(s): LIPASE, AMYLASE in the last 168 hours. No results for input(s): AMMONIA in the last 168 hours. Coagulation Profile: No results for input(s): INR, PROTIME in the last 168 hours. Cardiac Enzymes: No results for input(s): CKTOTAL, CKMB, CKMBINDEX, TROPONINI in the last 168 hours. BNP (last 3 results) No results for input(s): PROBNP in the last 8760 hours. HbA1C: No results for input(s): HGBA1C in the last 72 hours. CBG: No results for input(s): GLUCAP in the last 168 hours. Lipid Profile: No results for input(s): CHOL, HDL, LDLCALC, TRIG, CHOLHDL, LDLDIRECT in the last 72 hours. Thyroid Function Tests: No results for input(s): TSH, T4TOTAL, FREET4, T3FREE, THYROIDAB in the last 72 hours. Anemia Panel: No results for input(s): VITAMINB12, FOLATE, FERRITIN, TIBC, IRON, RETICCTPCT in the last 72 hours. Urine analysis:    Component Value Date/Time   COLORURINE YELLOW 11/06/2019 2353   APPEARANCEUR CLEAR 11/06/2019 2353   LABSPEC 1.012 11/06/2019 2353   PHURINE 7.0 11/06/2019 2353   GLUCOSEU NEGATIVE 11/06/2019 2353   HGBUR NEGATIVE 11/06/2019 2353   BILIRUBINUR NEGATIVE 11/06/2019 2353   BILIRUBINUR neg 05/30/2018 1454   KETONESUR NEGATIVE  11/06/2019 2353   PROTEINUR NEGATIVE 11/06/2019 2353   UROBILINOGEN 0.2 05/30/2018 1454   UROBILINOGEN 0.2 02/17/2013 1243   NITRITE NEGATIVE 11/06/2019 2353   LEUKOCYTESUR NEGATIVE 11/06/2019 2353    Radiological Exams on Admission: DG Tibia/Fibula Left  Result Date: 11/23/2019 CLINICAL DATA:  Fall, pain EXAM: LEFT TIBIA AND FIBULA - 2 VIEW COMPARISON:  None. FINDINGS: No acute bony abnormality. Specifically, no  fracture, subluxation, or dislocation. Soft tissues are unremarkable. IMPRESSION: No acute bony abnormality. Electronically Signed   By: Rolm Baptise M.D.   On: 11/23/2019 16:57   CT Head Wo Contrast  Result Date: 11/23/2019 CLINICAL DATA:  Head trauma.  Fall last night. EXAM: CT HEAD WITHOUT CONTRAST CT CERVICAL SPINE WITHOUT CONTRAST TECHNIQUE: Multidetector CT imaging of the head and cervical spine was performed following the standard protocol without intravenous contrast. Multiplanar CT image reconstructions of the cervical spine were also generated. COMPARISON:  None. FINDINGS: CT HEAD FINDINGS Brain: There is no evidence of an acute cortically based infarct, intracranial hemorrhage, mass, midline shift, or extra-axial fluid collection. Patchy hypodensities in the cerebral white matter bilaterally are nonspecific but compatible with moderate chronic small vessel ischemic disease. A punctate infarct is suspected in the left caudate nucleus, presumably chronic with this history. Mild cerebral atrophy is within normal limits for age. Vascular: Calcified atherosclerosis at the skull base. No hyperdense vessel. Skull: No fracture is identified with the skull vertex incompletely imaged. Sinuses/Orbits: Minimal mucosal thickening in the left sphenoid and ethmoid sinuses. Clear mastoid air cells. Right cataract extraction. Other: None. CT CERVICAL SPINE FINDINGS Alignment: Grade 1 anterolisthesis of C3 on C4, C4 on C5, T1 on T2, T2 on T3, and T3 on T4 and grade 1 retrolisthesis of C5 on C6,  all likely degenerative. Skull base and vertebrae: No acute fracture or suspicious osseous lesion. Soft tissues and spinal canal: No prevertebral fluid or swelling. No visible canal hematoma. Disc levels: Advanced disc degeneration at C5-6 greater than C6-7. Severe facet arthrosis bilaterally at C3-4 and on the left at C2-3 and C4-5. Moderate to severe neural foraminal stenosis on the right at C3-4 and on the left at C4-5, C5-6, and C6-7. Moderate spinal stenosis at C5-6. Upper chest: Reported separately. Other: Mild calcific atherosclerosis at the left greater than right carotid bifurcations. IMPRESSION: 1. No evidence of acute intracranial abnormality. 2. Moderate chronic small vessel ischemic disease. 3. No acute cervical spine fracture. 4. Advanced cervical disc and facet degeneration. Electronically Signed   By: Logan Bores M.D.   On: 11/23/2019 17:03   CT Chest W Contrast  Result Date: 11/23/2019 CLINICAL DATA:  Acute pain due to trauma. Left chest wall and back pain. EXAM: CT CHEST, ABDOMEN AND PELVIS WITHOUT CONTRAST CT LUMBAR SPINE WITHOUT CONTRAST TECHNIQUE: Multidetector CT imaging of the abdomen and pelvis was performed following the standard protocol without IV contrast. Multiplanar CT images of the lumbar spine were reconstructed from contemporary CT of the Abdomen, and Pelvis COMPARISON:  December 09, 2016 CT of the chest. FINDINGS: CHEST Cardiovascular: The heart is mildly enlarged. There is a trace pericardial effusion. Atherosclerotic changes are noted of the thoracic aorta without evidence for dissection or aneurysm. There are coronary artery calcifications. There is no large centrally located pulmonary embolism. Mediastinum/Nodes: --there is a necrotic appearing enlarged precarinal lymph node measuring approximately 1.4 cm in the short axis (axial series 4, image 21). There is an enlarged lymph node along the course of the esophagus measuring approximately 1.3 cm (axial series 4, image 22).  There is a pathologically enlarged lymph node in the upper mediastinum measuring approximately 1.9 cm in the short axis (axial series 4, image 9). -- No hilar lymphadenopathy. -- No axillary lymphadenopathy. --there are pathologically enlarged supraclavicular lymph nodes measuring approximately 1.5 cm (axial series 4, image 4). -- Normal thyroid gland where visualized. -  Unremarkable esophagus. Lungs/Pleura: There is a trace left-sided pleural effusion, otherwise  the lungs are mostly clear without evidence for pneumothorax or large focal infiltrate. The trachea is unremarkable. Musculoskeletal: There is a soft tissue nodule measuring approximately 1.2 cm in the superior left breast (axial series 4, image 26). This is new since 2018. There are nondisplaced fractures involving the posterior tenth through twelfth ribs on the left. ABDOMEN AND PELVIS Hepatobiliary: The liver is normal. The gallbladder is not well appreciated and may be surgically absent.There is no biliary ductal dilation. Pancreas: Normal contours without ductal dilatation. No peripancreatic fluid collection. Spleen: Unremarkable. Adrenals/Urinary Tract: --Adrenal glands: Unremarkable. --Right kidney/ureter: No hydronephrosis or radiopaque kidney stones. --Left kidney/ureter: No hydronephrosis or radiopaque kidney stones. --Urinary bladder: Unremarkable. Stomach/Bowel: --Stomach/Duodenum: The stomach is mildly distended with an air-fluid level. --Small bowel: Unremarkable. --Colon: There are scattered colonic diverticula without CT evidence for diverticulitis. There is a large amount of stool in the colon. --Appendix: Not visualized. No right lower quadrant inflammation or free fluid. Vascular/Lymphatic: Atherosclerotic calcification is present within the non-aneurysmal abdominal aorta, without hemodynamically significant stenosis. --a few enlarged retroperitoneal lymph nodes are noted measuring up to approximately 1.3 cm in the short axis. --multiple  pathologically enlarged mesenteric lymph nodes are noted primarily in the left mid abdomen. For example there is a nodal mass measuring approximately 3.8 x 3.7 cm (axial series 4, image 76). --mildly enlarged inguinal lymph nodes are noted. Reproductive: Status post hysterectomy. No adnexal mass. Other: No ascites or free air. The abdominal wall is normal. Musculoskeletal. There is no acute displaced fracture of the lumbar spine. Advanced multilevel degenerative changes are noted of the lumbar spine. IMPRESSION: 1. Nondisplaced fractures involving the posterior tenth through twelfth ribs on the left. No pneumothorax. 2. Trace left-sided pleural effusion. 3. No acute displaced fracture of the lumbar spine. 4. There is a 1.2 cm soft tissue nodule in the superior left breast. This is new since 2018. While this could represent a benign process, a primary breast malignancy is not excluded. Follow-up with outpatient mammography is recommended. 5. Multiple pathologically enlarged lymph nodes are noted in the chest and abdomen concerning for a lymphoproliferative disorder versus metastatic disease. The lymph nodes in the left supraclavicular region are readily amenable to ultrasound-guided percutaneous biopsy if clinically indicated. 6. Large amount of stool in the colon. Aortic Atherosclerosis (ICD10-I70.0). Electronically Signed   By: Constance Holster M.D.   On: 11/23/2019 17:09   CT Cervical Spine Wo Contrast  Result Date: 11/23/2019 CLINICAL DATA:  Head trauma.  Fall last night. EXAM: CT HEAD WITHOUT CONTRAST CT CERVICAL SPINE WITHOUT CONTRAST TECHNIQUE: Multidetector CT imaging of the head and cervical spine was performed following the standard protocol without intravenous contrast. Multiplanar CT image reconstructions of the cervical spine were also generated. COMPARISON:  None. FINDINGS: CT HEAD FINDINGS Brain: There is no evidence of an acute cortically based infarct, intracranial hemorrhage, mass, midline  shift, or extra-axial fluid collection. Patchy hypodensities in the cerebral white matter bilaterally are nonspecific but compatible with moderate chronic small vessel ischemic disease. A punctate infarct is suspected in the left caudate nucleus, presumably chronic with this history. Mild cerebral atrophy is within normal limits for age. Vascular: Calcified atherosclerosis at the skull base. No hyperdense vessel. Skull: No fracture is identified with the skull vertex incompletely imaged. Sinuses/Orbits: Minimal mucosal thickening in the left sphenoid and ethmoid sinuses. Clear mastoid air cells. Right cataract extraction. Other: None. CT CERVICAL SPINE FINDINGS Alignment: Grade 1 anterolisthesis of C3 on C4, C4 on C5, T1 on T2, T2 on T3,  and T3 on T4 and grade 1 retrolisthesis of C5 on C6, all likely degenerative. Skull base and vertebrae: No acute fracture or suspicious osseous lesion. Soft tissues and spinal canal: No prevertebral fluid or swelling. No visible canal hematoma. Disc levels: Advanced disc degeneration at C5-6 greater than C6-7. Severe facet arthrosis bilaterally at C3-4 and on the left at C2-3 and C4-5. Moderate to severe neural foraminal stenosis on the right at C3-4 and on the left at C4-5, C5-6, and C6-7. Moderate spinal stenosis at C5-6. Upper chest: Reported separately. Other: Mild calcific atherosclerosis at the left greater than right carotid bifurcations. IMPRESSION: 1. No evidence of acute intracranial abnormality. 2. Moderate chronic small vessel ischemic disease. 3. No acute cervical spine fracture. 4. Advanced cervical disc and facet degeneration. Electronically Signed   By: Logan Bores M.D.   On: 11/23/2019 17:03   CT ABDOMEN PELVIS W CONTRAST  Result Date: 11/23/2019 CLINICAL DATA:  Acute pain due to trauma. Left chest wall and back pain. EXAM: CT CHEST, ABDOMEN AND PELVIS WITHOUT CONTRAST CT LUMBAR SPINE WITHOUT CONTRAST TECHNIQUE: Multidetector CT imaging of the abdomen and  pelvis was performed following the standard protocol without IV contrast. Multiplanar CT images of the lumbar spine were reconstructed from contemporary CT of the Abdomen, and Pelvis COMPARISON:  December 09, 2016 CT of the chest. FINDINGS: CHEST Cardiovascular: The heart is mildly enlarged. There is a trace pericardial effusion. Atherosclerotic changes are noted of the thoracic aorta without evidence for dissection or aneurysm. There are coronary artery calcifications. There is no large centrally located pulmonary embolism. Mediastinum/Nodes: --there is a necrotic appearing enlarged precarinal lymph node measuring approximately 1.4 cm in the short axis (axial series 4, image 21). There is an enlarged lymph node along the course of the esophagus measuring approximately 1.3 cm (axial series 4, image 22). There is a pathologically enlarged lymph node in the upper mediastinum measuring approximately 1.9 cm in the short axis (axial series 4, image 9). -- No hilar lymphadenopathy. -- No axillary lymphadenopathy. --there are pathologically enlarged supraclavicular lymph nodes measuring approximately 1.5 cm (axial series 4, image 4). -- Normal thyroid gland where visualized. -  Unremarkable esophagus. Lungs/Pleura: There is a trace left-sided pleural effusion, otherwise the lungs are mostly clear without evidence for pneumothorax or large focal infiltrate. The trachea is unremarkable. Musculoskeletal: There is a soft tissue nodule measuring approximately 1.2 cm in the superior left breast (axial series 4, image 26). This is new since 2018. There are nondisplaced fractures involving the posterior tenth through twelfth ribs on the left. ABDOMEN AND PELVIS Hepatobiliary: The liver is normal. The gallbladder is not well appreciated and may be surgically absent.There is no biliary ductal dilation. Pancreas: Normal contours without ductal dilatation. No peripancreatic fluid collection. Spleen: Unremarkable. Adrenals/Urinary  Tract: --Adrenal glands: Unremarkable. --Right kidney/ureter: No hydronephrosis or radiopaque kidney stones. --Left kidney/ureter: No hydronephrosis or radiopaque kidney stones. --Urinary bladder: Unremarkable. Stomach/Bowel: --Stomach/Duodenum: The stomach is mildly distended with an air-fluid level. --Small bowel: Unremarkable. --Colon: There are scattered colonic diverticula without CT evidence for diverticulitis. There is a large amount of stool in the colon. --Appendix: Not visualized. No right lower quadrant inflammation or free fluid. Vascular/Lymphatic: Atherosclerotic calcification is present within the non-aneurysmal abdominal aorta, without hemodynamically significant stenosis. --a few enlarged retroperitoneal lymph nodes are noted measuring up to approximately 1.3 cm in the short axis. --multiple pathologically enlarged mesenteric lymph nodes are noted primarily in the left mid abdomen. For example there is a nodal mass measuring  approximately 3.8 x 3.7 cm (axial series 4, image 76). --mildly enlarged inguinal lymph nodes are noted. Reproductive: Status post hysterectomy. No adnexal mass. Other: No ascites or free air. The abdominal wall is normal. Musculoskeletal. There is no acute displaced fracture of the lumbar spine. Advanced multilevel degenerative changes are noted of the lumbar spine. IMPRESSION: 1. Nondisplaced fractures involving the posterior tenth through twelfth ribs on the left. No pneumothorax. 2. Trace left-sided pleural effusion. 3. No acute displaced fracture of the lumbar spine. 4. There is a 1.2 cm soft tissue nodule in the superior left breast. This is new since 2018. While this could represent a benign process, a primary breast malignancy is not excluded. Follow-up with outpatient mammography is recommended. 5. Multiple pathologically enlarged lymph nodes are noted in the chest and abdomen concerning for a lymphoproliferative disorder versus metastatic disease. The lymph nodes in  the left supraclavicular region are readily amenable to ultrasound-guided percutaneous biopsy if clinically indicated. 6. Large amount of stool in the colon. Aortic Atherosclerosis (ICD10-I70.0). Electronically Signed   By: Constance Holster M.D.   On: 11/23/2019 17:09   CT L-SPINE NO CHARGE  Result Date: 11/23/2019 CLINICAL DATA:  Acute pain due to trauma. Left chest wall and back pain. EXAM: CT CHEST, ABDOMEN AND PELVIS WITHOUT CONTRAST CT LUMBAR SPINE WITHOUT CONTRAST TECHNIQUE: Multidetector CT imaging of the abdomen and pelvis was performed following the standard protocol without IV contrast. Multiplanar CT images of the lumbar spine were reconstructed from contemporary CT of the Abdomen, and Pelvis COMPARISON:  December 09, 2016 CT of the chest. FINDINGS: CHEST Cardiovascular: The heart is mildly enlarged. There is a trace pericardial effusion. Atherosclerotic changes are noted of the thoracic aorta without evidence for dissection or aneurysm. There are coronary artery calcifications. There is no large centrally located pulmonary embolism. Mediastinum/Nodes: --there is a necrotic appearing enlarged precarinal lymph node measuring approximately 1.4 cm in the short axis (axial series 4, image 21). There is an enlarged lymph node along the course of the esophagus measuring approximately 1.3 cm (axial series 4, image 22). There is a pathologically enlarged lymph node in the upper mediastinum measuring approximately 1.9 cm in the short axis (axial series 4, image 9). -- No hilar lymphadenopathy. -- No axillary lymphadenopathy. --there are pathologically enlarged supraclavicular lymph nodes measuring approximately 1.5 cm (axial series 4, image 4). -- Normal thyroid gland where visualized. -  Unremarkable esophagus. Lungs/Pleura: There is a trace left-sided pleural effusion, otherwise the lungs are mostly clear without evidence for pneumothorax or large focal infiltrate. The trachea is unremarkable.  Musculoskeletal: There is a soft tissue nodule measuring approximately 1.2 cm in the superior left breast (axial series 4, image 26). This is new since 2018. There are nondisplaced fractures involving the posterior tenth through twelfth ribs on the left. ABDOMEN AND PELVIS Hepatobiliary: The liver is normal. The gallbladder is not well appreciated and may be surgically absent.There is no biliary ductal dilation. Pancreas: Normal contours without ductal dilatation. No peripancreatic fluid collection. Spleen: Unremarkable. Adrenals/Urinary Tract: --Adrenal glands: Unremarkable. --Right kidney/ureter: No hydronephrosis or radiopaque kidney stones. --Left kidney/ureter: No hydronephrosis or radiopaque kidney stones. --Urinary bladder: Unremarkable. Stomach/Bowel: --Stomach/Duodenum: The stomach is mildly distended with an air-fluid level. --Small bowel: Unremarkable. --Colon: There are scattered colonic diverticula without CT evidence for diverticulitis. There is a large amount of stool in the colon. --Appendix: Not visualized. No right lower quadrant inflammation or free fluid. Vascular/Lymphatic: Atherosclerotic calcification is present within the non-aneurysmal abdominal aorta, without hemodynamically significant  stenosis. --a few enlarged retroperitoneal lymph nodes are noted measuring up to approximately 1.3 cm in the short axis. --multiple pathologically enlarged mesenteric lymph nodes are noted primarily in the left mid abdomen. For example there is a nodal mass measuring approximately 3.8 x 3.7 cm (axial series 4, image 76). --mildly enlarged inguinal lymph nodes are noted. Reproductive: Status post hysterectomy. No adnexal mass. Other: No ascites or free air. The abdominal wall is normal. Musculoskeletal. There is no acute displaced fracture of the lumbar spine. Advanced multilevel degenerative changes are noted of the lumbar spine. IMPRESSION: 1. Nondisplaced fractures involving the posterior tenth through  twelfth ribs on the left. No pneumothorax. 2. Trace left-sided pleural effusion. 3. No acute displaced fracture of the lumbar spine. 4. There is a 1.2 cm soft tissue nodule in the superior left breast. This is new since 2018. While this could represent a benign process, a primary breast malignancy is not excluded. Follow-up with outpatient mammography is recommended. 5. Multiple pathologically enlarged lymph nodes are noted in the chest and abdomen concerning for a lymphoproliferative disorder versus metastatic disease. The lymph nodes in the left supraclavicular region are readily amenable to ultrasound-guided percutaneous biopsy if clinically indicated. 6. Large amount of stool in the colon. Aortic Atherosclerosis (ICD10-I70.0). Electronically Signed   By: Constance Holster M.D.   On: 11/23/2019 17:09    EKG: Not performed.  Assessment/Plan Principal Problem:   Multiple closed fractures of ribs of left side Active Problems:   Hypothyroidism   Hyperlipidemia   History of breast cancer   Fall in home   Depression with anxiety   Hypertension, essential   Anemia of chronic disease   CKD (chronic kidney disease) stage 3, GFR 30-59 ml/min (HCC)  Dawn Aguirre is a 86 y.o. female with medical history significant for HTN, HLD, hypothyroidism, CKD stage III, anemia of chronic disease and iron/B12 deficiency, GERD, depression/anxiety, and left-sided breast cancer who is admitted with left posterior 10th through 12th rib fractures after a fall at home.  Left posterior 10th-12th nondisplaced rib fractures occurring after fall at home: History suggestive of mechanical fall.  Orthostatic hypotension also possible however she is in too much pain to cooperate with orthostatic vital assessment at this time. -Continue pain control with Tylenol, Norco, and IV Dilaudid as needed with hold parameters -Incentive spirometer -Supplemental oxygen if needed -PT/OT eval  Hypotension/history of  hypertension: Recently taken off all home antihypertensives due to low blood pressures.  Remains borderline hypotensive on admission.  Continue maintenance IV fluids overnight.  Anemia of chronic disease and iron/B12 deficiency: Currently stable without obvious bleeding.  Continue iron and B12 supplement and continue to monitor.  Hypothyroidism: Last TSH 11/14/2019 was suppressed at 0.28.  Home levothyroxine dose was subsequently decreased from 100 mcg to 88 mcg daily per PCP. -Continue levothyroxine 88 mcg daily  Hyperlipidemia: Not currently on statin therapy due to reported side effect of myalgias.  History of left-sided breast cancer s/p central duct excision 10/15/2013: CT imaging on admission showing 1.2 cm soft tissue nodule in the outer superior left breast as well as multiple pathologically enlarged lymph nodes in the chest and abdomen concerning for lymphoproliferative disorder versus metastatic disease.  These findings were discussed with patient and son at bedside.  Per previous oncology note by Dr. Carvel Getting in Care Everywhere 01/05/2015, annual mammography was discontinued per patient's request and patient stated at that time even if malignancy were identified she would not seek further treatment.  Goals of care appear  to be unchanged at this time, outpatient mammogram can be considered if patient reconsiders.  CKD stage III: Chronic stable.  Continue to monitor.  Depression/anxiety: Very anxious on admission.  Will continue home mirtazapine 7.5 mg nightly.  Use IV Ativan 1 mg if needed for anxiety.   DVT prophylaxis: Lovenox Code Status: DNR, confirmed with patient Family Communication: Discussed with patient's son at bedside Disposition Plan: From home, dispo pending adequate pain control and subsequent PT/OT eval Consults called: None Admission status:  Status is: Observation  The patient remains OBS appropriate and will d/c before 2 midnights.  Dispo: The patient is  from: Home              Anticipated d/c is to: Home with home health therapy versus SNF              Anticipated d/c date is: 1 day              Patient currently is not medically stable to d/c.  Zada Finders MD [facility]  If 7PM-7AM, please contact night-coverage [email or web address]  11/23/2019, 6:38 PM

## 2019-11-23 NOTE — ED Triage Notes (Signed)
Patient had fall last night while attempting to get on bedside commode. Family helped patient back to bed and gave 500 mg Tylenol. Also took Tylenol 500 mg today at noon. Patient not on blood thinners. Complaining of L side rib pain and lower back pain.    98/50 HR 80 99% RA  CBG 164

## 2019-11-24 ENCOUNTER — Other Ambulatory Visit: Payer: Self-pay

## 2019-11-24 DIAGNOSIS — N1831 Chronic kidney disease, stage 3a: Secondary | ICD-10-CM | POA: Diagnosis not present

## 2019-11-24 DIAGNOSIS — I959 Hypotension, unspecified: Secondary | ICD-10-CM | POA: Diagnosis not present

## 2019-11-24 DIAGNOSIS — I7300 Raynaud's syndrome without gangrene: Secondary | ICD-10-CM | POA: Diagnosis present

## 2019-11-24 DIAGNOSIS — I129 Hypertensive chronic kidney disease with stage 1 through stage 4 chronic kidney disease, or unspecified chronic kidney disease: Secondary | ICD-10-CM | POA: Diagnosis present

## 2019-11-24 DIAGNOSIS — K5730 Diverticulosis of large intestine without perforation or abscess without bleeding: Secondary | ICD-10-CM | POA: Diagnosis present

## 2019-11-24 DIAGNOSIS — M069 Rheumatoid arthritis, unspecified: Secondary | ICD-10-CM | POA: Diagnosis present

## 2019-11-24 DIAGNOSIS — G8929 Other chronic pain: Secondary | ICD-10-CM | POA: Diagnosis present

## 2019-11-24 DIAGNOSIS — M4802 Spinal stenosis, cervical region: Secondary | ICD-10-CM | POA: Diagnosis present

## 2019-11-24 DIAGNOSIS — E43 Unspecified severe protein-calorie malnutrition: Secondary | ICD-10-CM | POA: Diagnosis not present

## 2019-11-24 DIAGNOSIS — D638 Anemia in other chronic diseases classified elsewhere: Secondary | ICD-10-CM | POA: Diagnosis not present

## 2019-11-24 DIAGNOSIS — Z66 Do not resuscitate: Secondary | ICD-10-CM | POA: Diagnosis not present

## 2019-11-24 DIAGNOSIS — J449 Chronic obstructive pulmonary disease, unspecified: Secondary | ICD-10-CM | POA: Diagnosis not present

## 2019-11-24 DIAGNOSIS — H9190 Unspecified hearing loss, unspecified ear: Secondary | ICD-10-CM | POA: Diagnosis present

## 2019-11-24 DIAGNOSIS — I899 Noninfective disorder of lymphatic vessels and lymph nodes, unspecified: Secondary | ICD-10-CM | POA: Diagnosis present

## 2019-11-24 DIAGNOSIS — I2510 Atherosclerotic heart disease of native coronary artery without angina pectoris: Secondary | ICD-10-CM | POA: Diagnosis present

## 2019-11-24 DIAGNOSIS — M810 Age-related osteoporosis without current pathological fracture: Secondary | ICD-10-CM | POA: Diagnosis present

## 2019-11-24 DIAGNOSIS — E872 Acidosis: Secondary | ICD-10-CM | POA: Diagnosis not present

## 2019-11-24 DIAGNOSIS — K219 Gastro-esophageal reflux disease without esophagitis: Secondary | ICD-10-CM | POA: Diagnosis present

## 2019-11-24 DIAGNOSIS — M5030 Other cervical disc degeneration, unspecified cervical region: Secondary | ICD-10-CM | POA: Diagnosis present

## 2019-11-24 DIAGNOSIS — S2242XA Multiple fractures of ribs, left side, initial encounter for closed fracture: Secondary | ICD-10-CM | POA: Diagnosis not present

## 2019-11-24 DIAGNOSIS — E039 Hypothyroidism, unspecified: Secondary | ICD-10-CM | POA: Diagnosis present

## 2019-11-24 DIAGNOSIS — Z853 Personal history of malignant neoplasm of breast: Secondary | ICD-10-CM | POA: Diagnosis not present

## 2019-11-24 DIAGNOSIS — E785 Hyperlipidemia, unspecified: Secondary | ICD-10-CM | POA: Diagnosis present

## 2019-11-24 DIAGNOSIS — W010XXA Fall on same level from slipping, tripping and stumbling without subsequent striking against object, initial encounter: Secondary | ICD-10-CM | POA: Diagnosis not present

## 2019-11-24 DIAGNOSIS — Y92019 Unspecified place in single-family (private) house as the place of occurrence of the external cause: Secondary | ICD-10-CM | POA: Diagnosis not present

## 2019-11-24 DIAGNOSIS — D631 Anemia in chronic kidney disease: Secondary | ICD-10-CM | POA: Diagnosis present

## 2019-11-24 DIAGNOSIS — W19XXXA Unspecified fall, initial encounter: Secondary | ICD-10-CM | POA: Diagnosis not present

## 2019-11-24 DIAGNOSIS — I081 Rheumatic disorders of both mitral and tricuspid valves: Secondary | ICD-10-CM | POA: Diagnosis present

## 2019-11-24 DIAGNOSIS — Z20822 Contact with and (suspected) exposure to covid-19: Secondary | ICD-10-CM | POA: Diagnosis not present

## 2019-11-24 LAB — BASIC METABOLIC PANEL
Anion gap: 12 (ref 5–15)
Anion gap: 13 (ref 5–15)
BUN: 39 mg/dL — ABNORMAL HIGH (ref 8–23)
BUN: 38 mg/dL — ABNORMAL HIGH (ref 8–23)
CO2: 21 mmol/L — ABNORMAL LOW (ref 22–32)
CO2: 22 mmol/L (ref 22–32)
Calcium: 8.8 mg/dL — ABNORMAL LOW (ref 8.9–10.3)
Calcium: 8.8 mg/dL — ABNORMAL LOW (ref 8.9–10.3)
Chloride: 106 mmol/L (ref 98–111)
Chloride: 103 mmol/L (ref 98–111)
Creatinine, Ser: 1.19 mg/dL — ABNORMAL HIGH (ref 0.44–1.00)
Creatinine, Ser: 1.12 mg/dL — ABNORMAL HIGH (ref 0.44–1.00)
GFR, Estimated: 40 mL/min — ABNORMAL LOW (ref >60)
GFR, Estimated: 43 mL/min — ABNORMAL LOW (ref >60)
Glucose, Bld: 116 mg/dL — ABNORMAL HIGH (ref 70–99)
Glucose, Bld: 102 mg/dL — ABNORMAL HIGH (ref 70–99)
Potassium: 5.7 mmol/L — ABNORMAL HIGH (ref 3.5–5.1)
Potassium: 4.9 mmol/L (ref 3.5–5.1)
Sodium: 139 mmol/L (ref 135–145)
Sodium: 138 mmol/L (ref 135–145)

## 2019-11-24 LAB — OCCULT BLOOD X 1 CARD TO LAB, STOOL: Fecal Occult Bld: NEGATIVE

## 2019-11-24 LAB — CBC
HCT: 25.6 % — ABNORMAL LOW (ref 36.0–46.0)
Hemoglobin: 7.3 g/dL — ABNORMAL LOW (ref 12.0–15.0)
MCH: 24.5 pg — ABNORMAL LOW (ref 26.0–34.0)
MCHC: 28.5 g/dL — ABNORMAL LOW (ref 30.0–36.0)
MCV: 85.9 fL (ref 80.0–100.0)
Platelets: 340 10*3/uL (ref 150–400)
RBC: 2.98 MIL/uL — ABNORMAL LOW (ref 3.87–5.11)
RDW: 22 % — ABNORMAL HIGH (ref 11.5–15.5)
WBC: 10.7 10*3/uL — ABNORMAL HIGH (ref 4.0–10.5)
nRBC: 0 % (ref 0.0–0.2)

## 2019-11-24 LAB — LACTATE DEHYDROGENASE: LDH: 132 U/L (ref 98–192)

## 2019-11-24 LAB — PREPARE RBC (CROSSMATCH)

## 2019-11-24 MED ORDER — RESOURCE INSTANT PROTEIN PO PWD PACKET
1.0000 | Freq: Three times a day (TID) | ORAL | Status: DC
  Administered 2019-11-25 (×2): 6 g via ORAL
  Filled 2019-11-24 (×4): qty 6

## 2019-11-24 MED ORDER — LACTULOSE 10 GM/15ML PO SOLN
10.0000 g | Freq: Two times a day (BID) | ORAL | Status: DC
  Administered 2019-11-24 – 2019-11-25 (×2): 10 g via ORAL
  Filled 2019-11-24 (×3): qty 15

## 2019-11-24 MED ORDER — POLYETHYLENE GLYCOL 3350 17 G PO PACK
17.0000 g | PACK | Freq: Two times a day (BID) | ORAL | Status: DC
  Administered 2019-11-24 – 2019-11-25 (×2): 17 g via ORAL
  Filled 2019-11-24 (×3): qty 1

## 2019-11-24 MED ORDER — SODIUM CHLORIDE 0.9 % IV SOLN: INTRAVENOUS | Status: AC

## 2019-11-24 MED ORDER — LIDOCAINE 5 % EX PTCH
1.0000 | MEDICATED_PATCH | CUTANEOUS | Status: DC
  Administered 2019-11-24 – 2019-11-25 (×2): 1 via TRANSDERMAL
  Filled 2019-11-24 (×2): qty 1

## 2019-11-24 MED ORDER — MINERAL OIL RE ENEM
1.0000 | ENEMA | Freq: Once | RECTAL | Status: AC
  Administered 2019-11-24: 1 via RECTAL
  Filled 2019-11-24: qty 1

## 2019-11-24 MED ORDER — SODIUM BICARBONATE 650 MG PO TABS
650.0000 mg | ORAL_TABLET | Freq: Every day | ORAL | Status: DC
  Administered 2019-11-25: 650 mg via ORAL
  Filled 2019-11-24 (×2): qty 1

## 2019-11-24 MED ORDER — POLYETHYLENE GLYCOL 3350 17 G PO PACK: 17.0000 g | PACK | Freq: Every day | ORAL | Status: DC

## 2019-11-24 MED ORDER — BOOST PLUS PO LIQD
237.0000 mL | Freq: Three times a day (TID) | ORAL | Status: DC
  Administered 2019-11-25 (×2): 237 mL via ORAL
  Filled 2019-11-24 (×5): qty 237

## 2019-11-24 MED ORDER — SODIUM CHLORIDE 0.9% IV SOLUTION: Freq: Once | INTRAVENOUS | Status: AC

## 2019-11-24 MED ORDER — SENNOSIDES-DOCUSATE SODIUM 8.6-50 MG PO TABS
1.0000 | ORAL_TABLET | Freq: Two times a day (BID) | ORAL | Status: DC
  Administered 2019-11-24 – 2019-11-25 (×2): 1 via ORAL
  Filled 2019-11-24 (×3): qty 1

## 2019-11-24 MED ORDER — PANTOPRAZOLE SODIUM 20 MG PO TBEC
20.0000 mg | DELAYED_RELEASE_TABLET | Freq: Every day | ORAL | Status: DC
  Administered 2019-11-25: 20 mg via ORAL
  Filled 2019-11-24 (×2): qty 1

## 2019-11-24 MED ORDER — DRONABINOL 2.5 MG PO CAPS
2.5000 mg | ORAL_CAPSULE | Freq: Two times a day (BID) | ORAL | Status: DC
  Administered 2019-11-25: 2.5 mg via ORAL
  Filled 2019-11-24: qty 1

## 2019-11-24 MED ORDER — LEVOTHYROXINE SODIUM 50 MCG PO TABS
50.0000 ug | ORAL_TABLET | Freq: Every day | ORAL | Status: DC
  Administered 2019-11-25: 50 ug via ORAL
  Filled 2019-11-24: qty 1

## 2019-11-24 NOTE — TOC Initial Note (Signed)
Transition of Care [facility]) - Initial/Assessment Note    Patient Details  Name: Dawn Aguirre MRN: [id number] Date of Birth: January 10, 1926  Transition of Care [facility]) CM/SW Contact:    Servando Snare, LCSW Phone Number: 11/24/2019, 2:11 PM  Clinical Narrative:      Received consult for home hospice with [facility]. LCSW made referral to hospice liaison Chrislyn Edison Pace. She will contact the family directly. TOC will continue to follow for dc needs.              Expected Discharge Plan: Home w Hospice Care Barriers to Discharge: Continued Medical Work up   Patient Goals and CMS Choice Patient states their goals for this hospitalization and ongoing recovery are:: Home with hospice   Choice offered to / list presented to : NA  Expected Discharge Plan and Services Expected Discharge Plan: [place]                                              Prior Living Arrangements/Services     Patient language and need for interpreter reviewed:: Yes        Need for Family Participation in Patient Care: Yes (Comment) Care giver support system in place?: Yes (comment)   Criminal Activity/Legal Involvement Pertinent to Current Situation/Hospitalization: No - Comment as needed  Activities of Daily Living   ADL Screening (condition at time of admission) Patient's cognitive ability adequate to safely complete daily activities?: No Is the patient deaf or have difficulty hearing?: No Does the patient have difficulty seeing, even when wearing glasses/contacts?: No Does the patient have difficulty concentrating, remembering, or making decisions?: Yes Patient able to express need for assistance with ADLs?: Yes Does the patient have difficulty dressing or bathing?: Yes Independently performs ADLs?: No Does the patient have difficulty walking or climbing stairs?: Yes Weakness of Legs: Both Weakness of Arms/Hands: Both  Permission Sought/Granted                   Emotional Assessment Appearance:: Appears stated age            Admission diagnosis:  Disease of lymph node [I89.9] Fall [W19.XXXA] Closed fracture of multiple ribs of left side, initial encounter [S22.42XA] Hypotension, unspecified hypotension type [I95.9] Patient Active Problem List   Diagnosis Date Noted  . Multiple closed fractures of ribs of left side 11/23/2019  . CKD (chronic kidney disease) stage 3, GFR 30-59 ml/min (HCC) 11/23/2019  . Adult failure to thrive 11/14/2019  . AKI (acute kidney injury) ([place]) 11/08/2019  . Anemia of chronic disease 11/06/2019  . Iron deficiency anemia 10/24/2019  . Vaginal pain 06/20/2019  . Left foot pain 01/22/2019  . Epistaxis 12/27/2018  . Hypertension, essential 10/06/2017  . Tricuspid regurgitation 12/09/2016  . Mitral regurgitation 12/09/2016  . Normochromic anemia 12/09/2016  . Chronic abdominal pain 10/18/2016  . Urine frequency 09/09/2016  . Sleep disorder 01/13/2016  . Female cystocele 12/30/2015  . Routine general medical examination at a health care facility 07/22/2015  . Hearing loss 07/22/2015  . Prediabetes 07/22/2015  . Depression with anxiety 04/24/2015  . Tremor 12/20/2014  . Chronic pain 10/14/2014  . B12 deficiency 07/16/2014  . Vitamin D deficiency 07/16/2014  . Fatigue 07/04/2014  . Encounter for Medicare annual wellness exam 01/13/2014  . Fall in home 12/24/2013  . History of breast cancer 12/18/2013  .  Epiploic appendagitis 02/22/2013  . Dry mouth 02/04/2013  . Benign paroxysmal positional vertigo 09/14/2012  . Low back pain 09/07/2012  . Constipation 11/04/2011  . Aortic stenosis 08/31/2010  . Hyperlipidemia   . STRESS REACTION, ACUTE, WITH EMOTIONAL DISTURBANCE 10/12/2007  . [place] DISEASE, CERVICAL 10/12/2007  . Hypothyroidism 01/22/2007  . RAYNAUD'S SYNDROME 01/22/2007  . EXTERNAL HEMORRHOIDS 01/22/2007  . Diverticulosis of colon 01/22/2007  . FIBROCYSTIC BREAST DISEASE 01/22/2007  .  Osteoporosis 01/22/2007  . GERD 12/16/2006  . DYSPNEA ON EXERTION 12/16/2006  . TOBACCO ABUSE, HX OF 12/16/2006  . HYSTERECTOMY, HX OF 12/16/2006   PCP:  [name], MD Pharmacy:   [id number] [place], [place] [address] Phone: [phone] Fax: [phone]     Social Determinants of Health (SDOH) Interventions    Readmission Risk Interventions Readmission Risk Prevention Plan 11/08/2019  Post Dischage Appt Complete  Medication Screening Complete  Transportation Screening Complete  Some recent data might be hidden

## 2019-11-24 NOTE — ED Notes (Signed)
Report called to [place]

## 2019-11-24 NOTE — Progress Notes (Signed)
Manufacturing engineer [facility])  Received request from [facility] for hospice services at home after discharge.  Chart and pt information under review by [facility] physician.  Hospice eligibility pending at this time.  Hospital liaison spoke with Tommye Standard, daughter, to initiate education related to hospice philosophy and services and to answer any questions at this time.  Jeane verbalized understanding of information given.  Per discussion the plan is to discharge home tomorrow afternoon at the earliest.  [place] send signed and completed DNR home with pt/family.  Please provide prescriptions at discharge as needed to ensure ongoing symptom management until patient can be admitted onto hospice services.    DME needs discussed.  Hospital bed and overbed table needed. Per discussion with Tommye Standard will aim for delivery in the morning. Pt's son Merry Proud will be contact for dleivery at [phone].  Address has been verified and is correct in the chart.  ACC information and contact numbers given to [facility].  Above information shared with Quincy Sheehan, [facility] Manager.  Please call with any questions or concerns.  Thank you for the opportunity to participate in this pt's care.  Domenic Moras, BSN, RN [employer] [phone] (24h on call)

## 2019-11-24 NOTE — Evaluation (Signed)
Physical Therapy Evaluation Patient Details Name: Dawn Aguirre MRN: [id number] DOB: 12-Dec-1925 Today's Date: 11/24/2019   History of Present Illness  86 y.o. female with medical history significant for HTN, HLD, hypothyroidism, CKD stage III, anemia of chronic disease and iron/B12 deficiency, GERD, depression/anxiety, and left-sided breast cancer who presents to the ED for evaluation after a fall at home.CT chest/abdomen/pelvis with contrast and CT lumbar spine without contrast showed nondisplaced fractures involving the posterior 10th through 12th ribs on the left without pneumothorax.  Trace left-sided pleural effusion. A 1.2 cm soft tissue nodule in the superior left breast is noted, new since 2018.  Multiple pathologically enlarged lymph nodes are noted in the chest and abdomen concerning for lymphoma proliferative disorder versus metastatic disease. recent admission for anemia 2 wks ago  Clinical Impression  Pt admitted with above diagnosis.  Pt nonverbal and unable to follow commands during PT eval. Marked difference from recent admission. Pt is not resistant to imposed movement however unable to self assist. will likely need SNF placement, may benefit from Palliative Care Consult to assist with goals of care. Will follow in acute setting Pt currently with functional limitations due to the deficits listed below (see PT Problem List). Pt will benefit from skilled PT to increase their independence and safety with mobility to allow discharge to the venue listed below.       Follow Up Recommendations SNF;Supervision/Assistance - 24 hour    Equipment Recommendations  None recommended by PT    Recommendations for Other Services       Precautions / Restrictions Precautions Precautions: Fall      Mobility  Bed Mobility Overal bed mobility: Needs Assistance Bed Mobility: Supine to Sit;Sit to Supine     Supine to sit: Total assist;+2 for physical assistance;+2 for  safety/equipment Sit to supine: +2 for physical assistance;+2 for safety/equipment;Total assist   General bed mobility comments: assist to elevate trunk, and bring LEs off bed. assist to lower trunk and lift bil LEs on to  bed  Transfers                 General transfer comment: unable  Ambulation/Gait                Stairs            Wheelchair Mobility    Modified Rankin (Stroke Patients Only)       Balance Overall balance assessment: Needs assistance Sitting-balance support: Feet supported;Bilateral upper extremity supported Sitting balance-Leahy Scale: Zero Sitting balance - Comments: max assist to maintain sitting EOB, repeated LOB anteriorly with no attempts to self correct       Standing balance comment: unable to stand                             Pertinent Vitals/Pain Pain Assessment: Faces Faces Pain Scale: No hurt    Home Living Family/patient expects to be discharged to:: Other (Comment) (pt unable to state) Living Arrangements: Spouse/significant other Available Help at Discharge: Family;Available 24 hours/day Type of Home: House Home Access: Stairs to enter   [employer] of Steps: 2 in garage no rails; 4 in front rail on both sides Home Layout: Multi-level;Able to live on main level with bedroom/bathroom Home Equipment: Shower seat;Walker - 2 wheels Additional Comments: pt unable to provide info and no family present at time of eval, info above taken from previous PT notes of 11/07/19    Prior Function  Level of Independence: Needs assistance   Gait / Transfers Assistance Needed: Ambulates without AD; can ambulate in community; (per notes of 11/07/19)  ADL's / Homemaking Assistance Needed: Pt was independent with ADLs ; she could do IADL but also had assistance        Hand Dominance        Extremity/Trunk Assessment   Upper Extremity Assessment Upper Extremity Assessment: Difficult to assess due to  impaired cognition    Lower Extremity Assessment Lower Extremity Assessment: Difficult to assess due to impaired cognition RLE Deficits / Details: PROM grossly WFL LLE Deficits / Details: PROM grossly WFL       Communication   Communication: HOH  Cognition Arousal/Alertness: Awake/alert Behavior During Therapy: Flat affect Overall Cognitive Status: History of cognitive impairments - at baseline Area of Impairment: Following commands                       Following Commands: Follows one step commands inconsistently;Follows multi-step commands inconsistently       General Comments: pt with hx of dementia, non-verbal at time of PT eval      General Comments      Exercises     Assessment/Plan    PT Assessment Patient needs continued PT services  PT Problem List Decreased strength;Decreased mobility;Decreased safety awareness;Decreased coordination;Decreased activity tolerance;Decreased cognition;Decreased balance;Decreased knowledge of use of DME       PT Treatment Interventions DME instruction;Therapeutic activities;Therapeutic exercise;Patient/family education;Balance training;Functional mobility training;Gait training    PT Goals (Current goals can be found in the Care Plan section)  Acute Rehab PT Goals Patient Stated Goal: pt unable to state PT Goal Formulation: Patient unable to participate in goal setting Time For Goal Achievement: 12/08/19 Potential to Achieve Goals: Poor    Frequency Min 2X/week   Barriers to discharge        Co-evaluation               AM-PAC PT "6 Clicks" Mobility  Outcome Measure Help needed turning from your back to your side while in a flat bed without using bedrails?: Total Help needed moving from lying on your back to sitting on the side of a flat bed without using bedrails?: Total Help needed moving to and from a bed to a chair (including a wheelchair)?: Total Help needed standing up from a chair using your arms  (e.g., wheelchair or bedside chair)?: Total Help needed to walk in hospital room?: Total Help needed climbing 3-5 steps with a railing? : Total 6 Click Score: 6    End of Session   Activity Tolerance: Patient limited by fatigue (limited by cognition) Patient left: in bed;with call bell/phone within reach;with bed alarm set Nurse Communication: Mobility status PT Visit Diagnosis: Other abnormalities of gait and mobility (R26.89);Muscle weakness (generalized) (M62.81)    Time: 5027-7412 PT Time Calculation (min) (ACUTE ONLY): 13 min   Charges:   PT Evaluation $PT Eval Moderate Complexity: 1 Mod          Kel Senn, PT  Acute Rehab Dept ([facility]) [phone] Pager [phone]  11/24/2019   [facility] 11/24/2019, 3:45 PM

## 2019-11-24 NOTE — Progress Notes (Addendum)
PROGRESS NOTE    BLAKELYNN Aguirre  BEM:[id number] DOB: 1925-04-10 DOA: 11/23/2019 PCP: [name], MD    Chief Complaint  Patient presents with  . Fall    Brief Narrative:   Dawn Aguirre is a 86 y.o. female with medical history significant for HTN, HLD, hypothyroidism, CKD stage III, anemia of chronic disease and iron/B12 deficiency, GERD, depression/anxiety, and left-sided breast cancer who presents to the ED for evaluation after a fall at home.  CT showed nondisplaced 10-12th rib fractures, she is hospitalized for pain control.  Subjective:   She is very fail, lethargic, when asked if she is in pain, she shakes her head, She drifts back to sleep after brief conversation,  I stayed in her room for a prolonged duration for goals of care discussion with multiple family members, did notice she moans  in pain intermittenly She received iv dilaudid at 3am and 9am  Daughter at bedside  Assessment & Plan:   Principal Problem:   Multiple closed fractures of ribs of left side Active Problems:   Hypothyroidism   Hyperlipidemia   History of breast cancer   Fall in home   Depression with anxiety   Hypertension, essential   Anemia of chronic disease   CKD (chronic kidney disease) stage 3, GFR 30-59 ml/min (HCC)   Falls/ FTT, was just hospitalized from9/29 to 10/1 due to progressively worsening oral intake, 20lbs of weight loss in two weeks/severe acute malnutrition, generalized weakness, mostly bedbound status in the last few weeks PT evaluated patient states she could not stand up even with two person assists Nutrition supplement,  Remeron make patient more drowsy per family, will try Marinol -Currently she appears dehydrated, she is not awake enough to  have meaningful oral intake ,continue hydration   nondisplaced 10-12th rib fractures Pain control with lidocaine patch, and iv dilaudid, transition to oral when able   Hyperkalemia: Likely from dehydration and  metabolic acidosis, potassium 5.7 on presentation, potassium 4.9 after hydration Start low dose sodium bicarb supplement  CKDIIIa/metabolic acidosis -Start sodium bicarb supplement -Renal function appears at baseline  Normocytic anemia,  fecal occult blood test negative,  she does has B12 and iron deficiency for which she is on supplement. CT chest/abdomen/pelvis no internal bleeding, but showed multiple pathologically enlarged lymph node in chest and abdomen , a lymphoproliferative disorder versus metastatic disease. The lymph nodes in the left supraclavicular region are readily amenable to ultrasound-guided percutaneous biopsy if clinically indicated. LDH unremarkable Family report she received blood transfusion last hospitalization which made patient feel stronger, hemoglobin 7.3, will give 1 unit PRBC today.  H/o breast cancer s/p lumpectomy in 2018 at [facility] There is a 1.2 cm soft tissue nodule in the superior left breast. This is new since 2018. Family report patient  does not wish to have repeat mammogram- she does not wish to want aggressive medical  treatment  Hypothyroidism Suppressed TSH, decrease Synthroid dose from 88 mcg to 50 mcg Per family patient synthroid dose was decreased from 170mcg to 71mcg a week ago, will stay on 29mcg in the hospital, will resume 6mcg at discharge.  Constipation: CT scan showed increased stool burden, family report the patient has been on Senokot and MiraLAX without relief, would like to proceed with enema, order placed  Dementia: Recently diagnosed 6 months ago  Goals of care discussion with daughter at bedside, son and patient's husband over the phone, patient is DNR, she  does not want aggressive medical treatment, does not want  invasive procedure, does not want feeding tube, family would like to treat the treatable, want patient to be comfortable but not ready for full comfort care yet, family is hoping with pain control , hydration and blood  transfusion patient would get stronger, they prefer patient to go home with home hospice, they prefer [facility].  I attempted to contact [facility], however it seems like [facility] is covering the weekend, discussed with Education officer, museum who is going to try to  Get in touch with [facility].     DVT prophylaxis: enoxaparin (LOVENOX) injection 30 mg Start: 11/23/19 1945   Code Status: Family Communication: daughter at bedside, son and patent's husband over the phone Disposition:   Status is: inpatient   Dispo: The patient is from: home              Anticipated d/c is to: home with home hospice               Anticipated d/c date is: 24-48hrs if pain well controlled with oral meds, and home hospice /equipment set up                Consultants:   Hospice care ( social worker to try to contact [facility])  Procedures:   prbc transfusion x1  Antimicrobials:    none    Objective: Vitals:   11/24/19 0630 11/24/19 0700 11/24/19 0754 11/24/19 0820  BP: (!) 101/49 (!) 98/48 (!) 98/47 (!) 104/91  Pulse: 85 79 83 90  Resp: (!) 23 20 (!) 24 16  Temp:   99.1 F (37.3 C) 99 F (37.2 C)  TempSrc:   Oral Axillary  SpO2: 96% 94%  97%    Intake/Output Summary (Last 24 hours) at 11/24/2019 0847 Last data filed at 11/24/2019 0328 Gross per 24 hour  Intake 541.67 ml  Output 500 ml  Net 41.67 ml   There were no vitals filed for this visit.  Examination:  General exam: very frail, drowsy, she does not talk, she shake her head/nodding to questions briefly, she drift back to sleep quickly ,  Respiratory system: Clear to auscultation. Poor Respiratory effort normal. Cardiovascular system: S1 & S2 heard, RRR. No pedal edema. Gastrointestinal system: Abdomen is nondistended, soft and nontender.  Normal bowel sounds heard. Central nervous system: drowsy Extremities: Generalized weakness Skin: No rashes, lesions or ulcers Psychiatry: Very drowsy    Data Reviewed: I have  personally reviewed following labs and imaging studies  CBC: Recent Labs  Lab 11/23/19 1536 11/23/19 1556 11/24/19 0500  WBC 9.0  --  10.7*  NEUTROABS 6.9  --   --   HGB 8.3* 8.5* 7.3*  HCT 28.3* 25.0* 25.6*  MCV 84.0  --  85.9  PLT 390  --  825    Basic Metabolic Panel: Recent Labs  Lab 11/23/19 1536 11/23/19 1556 11/24/19 0500  NA 134* 136 139  K 4.6 4.6 5.7*  CL 103 104 106  CO2 19*  --  21*  GLUCOSE 167* 163* 116*  BUN 48* 42* 39*  CREATININE 1.24* 1.20* 1.19*  CALCIUM 8.5*  --  8.8*    GFR: Estimated Creatinine Clearance: 24.7 mL/min (A) (by C-G formula based on SCr of 1.19 mg/dL (H)).  Liver Function Tests: No results for input(s): AST, ALT, ALKPHOS, BILITOT, PROT, ALBUMIN in the last 168 hours.  CBG: No results for input(s): GLUCAP in the last 168 hours.   Recent Results (from the past 240 hour(s))  Respiratory Panel by RT PCR (Flu  A&B, Covid) - Nasopharyngeal Swab     Status: None   Collection Time: 11/23/19  6:08 PM   Specimen: Nasopharyngeal Swab  Result Value Ref Range Status   SARS Coronavirus 2 by RT PCR NEGATIVE NEGATIVE Final    Comment: (NOTE) SARS-CoV-2 target nucleic acids are NOT DETECTED.  The SARS-CoV-2 RNA is generally detectable in upper respiratoy specimens during the acute phase of infection. The lowest concentration of SARS-CoV-2 viral copies this assay can detect is 131 copies/mL. A negative result does not preclude SARS-Cov-2 infection and should not be used as the sole basis for treatment or other patient management decisions. A negative result may occur with  improper specimen collection/handling, submission of specimen other than nasopharyngeal swab, presence of viral mutation(s) within the areas targeted by this assay, and inadequate number of viral copies (<131 copies/mL). A negative result must be combined with clinical observations, patient history, and epidemiological information. The expected result is  Negative.  Fact Sheet for Patients:  PinkCheek.be  Fact Sheet for Healthcare Providers:  GravelBags.it  This test is no t yet approved or cleared by the [place] FDA and  has been authorized for detection and/or diagnosis of SARS-CoV-2 by FDA under an Emergency Use Authorization (EUA). This EUA will remain  in effect (meaning this test can be used) for the duration of the COVID-19 declaration under Section 564(b)(1) of the Act, 21 U.S.C. section 360bbb-3(b)(1), unless the authorization is terminated or revoked sooner.     Influenza A by PCR NEGATIVE NEGATIVE Final   Influenza B by PCR NEGATIVE NEGATIVE Final    Comment: (NOTE) The Xpert Xpress SARS-CoV-2/FLU/RSV assay is intended as an aid in  the diagnosis of influenza from Nasopharyngeal swab specimens and  should not be used as a sole basis for treatment. Nasal washings and  aspirates are unacceptable for Xpert Xpress SARS-CoV-2/FLU/RSV  testing.  Fact Sheet for Patients: PinkCheek.be  Fact Sheet for Healthcare Providers: GravelBags.it  This test is not yet approved or cleared by the [place] FDA and  has been authorized for detection and/or diagnosis of SARS-CoV-2 by  FDA under an Emergency Use Authorization (EUA). This EUA will remain  in effect (meaning this test can be used) for the duration of the  Covid-19 declaration under Section 564(b)(1) of the Act, 21  U.S.C. section 360bbb-3(b)(1), unless the authorization is  terminated or revoked. Performed at [facility], [place] [address], [place], [place] [address]          Radiology Studies: DG Tibia/Fibula Left  Result Date: 11/23/2019 CLINICAL DATA:  Fall, pain EXAM: LEFT TIBIA AND FIBULA - 2 VIEW COMPARISON:  None. FINDINGS: No acute bony abnormality. Specifically, no fracture, subluxation, or dislocation. Soft tissues  are unremarkable. IMPRESSION: No acute bony abnormality. Electronically Signed   By: Rolm Baptise M.D.   On: 11/23/2019 16:57   CT Head Wo Contrast  Result Date: 11/23/2019 CLINICAL DATA:  Head trauma.  Fall last night. EXAM: CT HEAD WITHOUT CONTRAST CT CERVICAL SPINE WITHOUT CONTRAST TECHNIQUE: Multidetector CT imaging of the head and cervical spine was performed following the standard protocol without intravenous contrast. Multiplanar CT image reconstructions of the cervical spine were also generated. COMPARISON:  None. FINDINGS: CT HEAD FINDINGS Brain: There is no evidence of an acute cortically based infarct, intracranial hemorrhage, mass, midline shift, or extra-axial fluid collection. Patchy hypodensities in the cerebral white matter bilaterally are nonspecific but compatible with moderate chronic small vessel ischemic disease. A punctate infarct is suspected  in the left caudate nucleus, presumably chronic with this history. Mild cerebral atrophy is within normal limits for age. Vascular: Calcified atherosclerosis at the skull base. No hyperdense vessel. Skull: No fracture is identified with the skull vertex incompletely imaged. Sinuses/Orbits: Minimal mucosal thickening in the left sphenoid and ethmoid sinuses. Clear mastoid air cells. Right cataract extraction. Other: None. CT CERVICAL SPINE FINDINGS Alignment: Grade 1 anterolisthesis of C3 on C4, C4 on C5, T1 on T2, T2 on T3, and T3 on T4 and grade 1 retrolisthesis of C5 on C6, all likely degenerative. Skull base and vertebrae: No acute fracture or suspicious osseous lesion. Soft tissues and spinal canal: No prevertebral fluid or swelling. No visible canal hematoma. Disc levels: Advanced disc degeneration at C5-6 greater than C6-7. Severe facet arthrosis bilaterally at C3-4 and on the left at C2-3 and C4-5. Moderate to severe neural foraminal stenosis on the right at C3-4 and on the left at C4-5, C5-6, and C6-7. Moderate spinal stenosis at C5-6. Upper  chest: Reported separately. Other: Mild calcific atherosclerosis at the left greater than right carotid bifurcations. IMPRESSION: 1. No evidence of acute intracranial abnormality. 2. Moderate chronic small vessel ischemic disease. 3. No acute cervical spine fracture. 4. Advanced cervical disc and facet degeneration. Electronically Signed   By: Logan Bores M.D.   On: 11/23/2019 17:03   CT Chest W Contrast  Result Date: 11/23/2019 CLINICAL DATA:  Acute pain due to trauma. Left chest wall and back pain. EXAM: CT CHEST, ABDOMEN AND PELVIS WITHOUT CONTRAST CT LUMBAR SPINE WITHOUT CONTRAST TECHNIQUE: Multidetector CT imaging of the abdomen and pelvis was performed following the standard protocol without IV contrast. Multiplanar CT images of the lumbar spine were reconstructed from contemporary CT of the Abdomen, and Pelvis COMPARISON:  December 09, 2016 CT of the chest. FINDINGS: CHEST Cardiovascular: The heart is mildly enlarged. There is a trace pericardial effusion. Atherosclerotic changes are noted of the thoracic aorta without evidence for dissection or aneurysm. There are coronary artery calcifications. There is no large centrally located pulmonary embolism. Mediastinum/Nodes: --there is a necrotic appearing enlarged precarinal lymph node measuring approximately 1.4 cm in the short axis (axial series 4, image 21). There is an enlarged lymph node along the course of the esophagus measuring approximately 1.3 cm (axial series 4, image 22). There is a pathologically enlarged lymph node in the upper mediastinum measuring approximately 1.9 cm in the short axis (axial series 4, image 9). -- No hilar lymphadenopathy. -- No axillary lymphadenopathy. --there are pathologically enlarged supraclavicular lymph nodes measuring approximately 1.5 cm (axial series 4, image 4). -- Normal thyroid gland where visualized. -  Unremarkable esophagus. Lungs/Pleura: There is a trace left-sided pleural effusion, otherwise the lungs are  mostly clear without evidence for pneumothorax or large focal infiltrate. The trachea is unremarkable. Musculoskeletal: There is a soft tissue nodule measuring approximately 1.2 cm in the superior left breast (axial series 4, image 26). This is new since 2018. There are nondisplaced fractures involving the posterior tenth through twelfth ribs on the left. ABDOMEN AND PELVIS Hepatobiliary: The liver is normal. The gallbladder is not well appreciated and may be surgically absent.There is no biliary ductal dilation. Pancreas: Normal contours without ductal dilatation. No peripancreatic fluid collection. Spleen: Unremarkable. Adrenals/Urinary Tract: --Adrenal glands: Unremarkable. --Right kidney/ureter: No hydronephrosis or radiopaque kidney stones. --Left kidney/ureter: No hydronephrosis or radiopaque kidney stones. --Urinary bladder: Unremarkable. Stomach/Bowel: --Stomach/Duodenum: The stomach is mildly distended with an air-fluid level. --Small bowel: Unremarkable. --Colon: There are scattered colonic diverticula  without CT evidence for diverticulitis. There is a large amount of stool in the colon. --Appendix: Not visualized. No right lower quadrant inflammation or free fluid. Vascular/Lymphatic: Atherosclerotic calcification is present within the non-aneurysmal abdominal aorta, without hemodynamically significant stenosis. --a few enlarged retroperitoneal lymph nodes are noted measuring up to approximately 1.3 cm in the short axis. --multiple pathologically enlarged mesenteric lymph nodes are noted primarily in the left mid abdomen. For example there is a nodal mass measuring approximately 3.8 x 3.7 cm (axial series 4, image 76). --mildly enlarged inguinal lymph nodes are noted. Reproductive: Status post hysterectomy. No adnexal mass. Other: No ascites or free air. The abdominal wall is normal. Musculoskeletal. There is no acute displaced fracture of the lumbar spine. Advanced multilevel degenerative changes are  noted of the lumbar spine. IMPRESSION: 1. Nondisplaced fractures involving the posterior tenth through twelfth ribs on the left. No pneumothorax. 2. Trace left-sided pleural effusion. 3. No acute displaced fracture of the lumbar spine. 4. There is a 1.2 cm soft tissue nodule in the superior left breast. This is new since 2018. While this could represent a benign process, a primary breast malignancy is not excluded. Follow-up with outpatient mammography is recommended. 5. Multiple pathologically enlarged lymph nodes are noted in the chest and abdomen concerning for a lymphoproliferative disorder versus metastatic disease. The lymph nodes in the left supraclavicular region are readily amenable to ultrasound-guided percutaneous biopsy if clinically indicated. 6. Large amount of stool in the colon. Aortic Atherosclerosis (ICD10-I70.0). Electronically Signed   By: Constance Holster M.D.   On: 11/23/2019 17:09   CT Cervical Spine Wo Contrast  Result Date: 11/23/2019 CLINICAL DATA:  Head trauma.  Fall last night. EXAM: CT HEAD WITHOUT CONTRAST CT CERVICAL SPINE WITHOUT CONTRAST TECHNIQUE: Multidetector CT imaging of the head and cervical spine was performed following the standard protocol without intravenous contrast. Multiplanar CT image reconstructions of the cervical spine were also generated. COMPARISON:  None. FINDINGS: CT HEAD FINDINGS Brain: There is no evidence of an acute cortically based infarct, intracranial hemorrhage, mass, midline shift, or extra-axial fluid collection. Patchy hypodensities in the cerebral white matter bilaterally are nonspecific but compatible with moderate chronic small vessel ischemic disease. A punctate infarct is suspected in the left caudate nucleus, presumably chronic with this history. Mild cerebral atrophy is within normal limits for age. Vascular: Calcified atherosclerosis at the skull base. No hyperdense vessel. Skull: No fracture is identified with the skull vertex  incompletely imaged. Sinuses/Orbits: Minimal mucosal thickening in the left sphenoid and ethmoid sinuses. Clear mastoid air cells. Right cataract extraction. Other: None. CT CERVICAL SPINE FINDINGS Alignment: Grade 1 anterolisthesis of C3 on C4, C4 on C5, T1 on T2, T2 on T3, and T3 on T4 and grade 1 retrolisthesis of C5 on C6, all likely degenerative. Skull base and vertebrae: No acute fracture or suspicious osseous lesion. Soft tissues and spinal canal: No prevertebral fluid or swelling. No visible canal hematoma. Disc levels: Advanced disc degeneration at C5-6 greater than C6-7. Severe facet arthrosis bilaterally at C3-4 and on the left at C2-3 and C4-5. Moderate to severe neural foraminal stenosis on the right at C3-4 and on the left at C4-5, C5-6, and C6-7. Moderate spinal stenosis at C5-6. Upper chest: Reported separately. Other: Mild calcific atherosclerosis at the left greater than right carotid bifurcations. IMPRESSION: 1. No evidence of acute intracranial abnormality. 2. Moderate chronic small vessel ischemic disease. 3. No acute cervical spine fracture. 4. Advanced cervical disc and facet degeneration. Electronically Signed  By: Logan Bores M.D.   On: 11/23/2019 17:03   CT ABDOMEN PELVIS W CONTRAST  Result Date: 11/23/2019 CLINICAL DATA:  Acute pain due to trauma. Left chest wall and back pain. EXAM: CT CHEST, ABDOMEN AND PELVIS WITHOUT CONTRAST CT LUMBAR SPINE WITHOUT CONTRAST TECHNIQUE: Multidetector CT imaging of the abdomen and pelvis was performed following the standard protocol without IV contrast. Multiplanar CT images of the lumbar spine were reconstructed from contemporary CT of the Abdomen, and Pelvis COMPARISON:  December 09, 2016 CT of the chest. FINDINGS: CHEST Cardiovascular: The heart is mildly enlarged. There is a trace pericardial effusion. Atherosclerotic changes are noted of the thoracic aorta without evidence for dissection or aneurysm. There are coronary artery calcifications.  There is no large centrally located pulmonary embolism. Mediastinum/Nodes: --there is a necrotic appearing enlarged precarinal lymph node measuring approximately 1.4 cm in the short axis (axial series 4, image 21). There is an enlarged lymph node along the course of the esophagus measuring approximately 1.3 cm (axial series 4, image 22). There is a pathologically enlarged lymph node in the upper mediastinum measuring approximately 1.9 cm in the short axis (axial series 4, image 9). -- No hilar lymphadenopathy. -- No axillary lymphadenopathy. --there are pathologically enlarged supraclavicular lymph nodes measuring approximately 1.5 cm (axial series 4, image 4). -- Normal thyroid gland where visualized. -  Unremarkable esophagus. Lungs/Pleura: There is a trace left-sided pleural effusion, otherwise the lungs are mostly clear without evidence for pneumothorax or large focal infiltrate. The trachea is unremarkable. Musculoskeletal: There is a soft tissue nodule measuring approximately 1.2 cm in the superior left breast (axial series 4, image 26). This is new since 2018. There are nondisplaced fractures involving the posterior tenth through twelfth ribs on the left. ABDOMEN AND PELVIS Hepatobiliary: The liver is normal. The gallbladder is not well appreciated and may be surgically absent.There is no biliary ductal dilation. Pancreas: Normal contours without ductal dilatation. No peripancreatic fluid collection. Spleen: Unremarkable. Adrenals/Urinary Tract: --Adrenal glands: Unremarkable. --Right kidney/ureter: No hydronephrosis or radiopaque kidney stones. --Left kidney/ureter: No hydronephrosis or radiopaque kidney stones. --Urinary bladder: Unremarkable. Stomach/Bowel: --Stomach/Duodenum: The stomach is mildly distended with an air-fluid level. --Small bowel: Unremarkable. --Colon: There are scattered colonic diverticula without CT evidence for diverticulitis. There is a large amount of stool in the colon. --Appendix:  Not visualized. No right lower quadrant inflammation or free fluid. Vascular/Lymphatic: Atherosclerotic calcification is present within the non-aneurysmal abdominal aorta, without hemodynamically significant stenosis. --a few enlarged retroperitoneal lymph nodes are noted measuring up to approximately 1.3 cm in the short axis. --multiple pathologically enlarged mesenteric lymph nodes are noted primarily in the left mid abdomen. For example there is a nodal mass measuring approximately 3.8 x 3.7 cm (axial series 4, image 76). --mildly enlarged inguinal lymph nodes are noted. Reproductive: Status post hysterectomy. No adnexal mass. Other: No ascites or free air. The abdominal wall is normal. Musculoskeletal. There is no acute displaced fracture of the lumbar spine. Advanced multilevel degenerative changes are noted of the lumbar spine. IMPRESSION: 1. Nondisplaced fractures involving the posterior tenth through twelfth ribs on the left. No pneumothorax. 2. Trace left-sided pleural effusion. 3. No acute displaced fracture of the lumbar spine. 4. There is a 1.2 cm soft tissue nodule in the superior left breast. This is new since 2018. While this could represent a benign process, a primary breast malignancy is not excluded. Follow-up with outpatient mammography is recommended. 5. Multiple pathologically enlarged lymph nodes are noted in the chest and  abdomen concerning for a lymphoproliferative disorder versus metastatic disease. The lymph nodes in the left supraclavicular region are readily amenable to ultrasound-guided percutaneous biopsy if clinically indicated. 6. Large amount of stool in the colon. Aortic Atherosclerosis (ICD10-I70.0). Electronically Signed   By: Constance Holster M.D.   On: 11/23/2019 17:09   CT L-SPINE NO CHARGE  Result Date: 11/23/2019 CLINICAL DATA:  Acute pain due to trauma. Left chest wall and back pain. EXAM: CT CHEST, ABDOMEN AND PELVIS WITHOUT CONTRAST CT LUMBAR SPINE WITHOUT CONTRAST  TECHNIQUE: Multidetector CT imaging of the abdomen and pelvis was performed following the standard protocol without IV contrast. Multiplanar CT images of the lumbar spine were reconstructed from contemporary CT of the Abdomen, and Pelvis COMPARISON:  December 09, 2016 CT of the chest. FINDINGS: CHEST Cardiovascular: The heart is mildly enlarged. There is a trace pericardial effusion. Atherosclerotic changes are noted of the thoracic aorta without evidence for dissection or aneurysm. There are coronary artery calcifications. There is no large centrally located pulmonary embolism. Mediastinum/Nodes: --there is a necrotic appearing enlarged precarinal lymph node measuring approximately 1.4 cm in the short axis (axial series 4, image 21). There is an enlarged lymph node along the course of the esophagus measuring approximately 1.3 cm (axial series 4, image 22). There is a pathologically enlarged lymph node in the upper mediastinum measuring approximately 1.9 cm in the short axis (axial series 4, image 9). -- No hilar lymphadenopathy. -- No axillary lymphadenopathy. --there are pathologically enlarged supraclavicular lymph nodes measuring approximately 1.5 cm (axial series 4, image 4). -- Normal thyroid gland where visualized. -  Unremarkable esophagus. Lungs/Pleura: There is a trace left-sided pleural effusion, otherwise the lungs are mostly clear without evidence for pneumothorax or large focal infiltrate. The trachea is unremarkable. Musculoskeletal: There is a soft tissue nodule measuring approximately 1.2 cm in the superior left breast (axial series 4, image 26). This is new since 2018. There are nondisplaced fractures involving the posterior tenth through twelfth ribs on the left. ABDOMEN AND PELVIS Hepatobiliary: The liver is normal. The gallbladder is not well appreciated and may be surgically absent.There is no biliary ductal dilation. Pancreas: Normal contours without ductal dilatation. No peripancreatic fluid  collection. Spleen: Unremarkable. Adrenals/Urinary Tract: --Adrenal glands: Unremarkable. --Right kidney/ureter: No hydronephrosis or radiopaque kidney stones. --Left kidney/ureter: No hydronephrosis or radiopaque kidney stones. --Urinary bladder: Unremarkable. Stomach/Bowel: --Stomach/Duodenum: The stomach is mildly distended with an air-fluid level. --Small bowel: Unremarkable. --Colon: There are scattered colonic diverticula without CT evidence for diverticulitis. There is a large amount of stool in the colon. --Appendix: Not visualized. No right lower quadrant inflammation or free fluid. Vascular/Lymphatic: Atherosclerotic calcification is present within the non-aneurysmal abdominal aorta, without hemodynamically significant stenosis. --a few enlarged retroperitoneal lymph nodes are noted measuring up to approximately 1.3 cm in the short axis. --multiple pathologically enlarged mesenteric lymph nodes are noted primarily in the left mid abdomen. For example there is a nodal mass measuring approximately 3.8 x 3.7 cm (axial series 4, image 76). --mildly enlarged inguinal lymph nodes are noted. Reproductive: Status post hysterectomy. No adnexal mass. Other: No ascites or free air. The abdominal wall is normal. Musculoskeletal. There is no acute displaced fracture of the lumbar spine. Advanced multilevel degenerative changes are noted of the lumbar spine. IMPRESSION: 1. Nondisplaced fractures involving the posterior tenth through twelfth ribs on the left. No pneumothorax. 2. Trace left-sided pleural effusion. 3. No acute displaced fracture of the lumbar spine. 4. There is a 1.2 cm soft tissue nodule in  the superior left breast. This is new since 2018. While this could represent a benign process, a primary breast malignancy is not excluded. Follow-up with outpatient mammography is recommended. 5. Multiple pathologically enlarged lymph nodes are noted in the chest and abdomen concerning for a lymphoproliferative  disorder versus metastatic disease. The lymph nodes in the left supraclavicular region are readily amenable to ultrasound-guided percutaneous biopsy if clinically indicated. 6. Large amount of stool in the colon. Aortic Atherosclerosis (ICD10-I70.0). Electronically Signed   By: Constance Holster M.D.   On: 11/23/2019 17:09        Scheduled Meds: . enoxaparin (LOVENOX) injection  30 mg Subcutaneous Q24H  . ferrous sulfate  300 mg Oral Daily  . levothyroxine  88 mcg Oral Q0600  . mirtazapine  7.5 mg Oral QHS  . polyethylene glycol  17 g Oral Daily  . senna-docusate  1 tablet Oral BID  . cyanocobalamin  2,000 mcg Oral Daily   Continuous Infusions:   LOS: 0 days   Time spent: 54mins Greater than 50% of this time was spent in counseling, explanation of diagnosis, planning of further management, and coordination of care.  I have personally reviewed and interpreted on  11/24/2019 daily labs,  imagings as discussed above under date review session and assessment and plans.  I reviewed all nursing notes, pharmacy notes,  vitals, pertinent old records  I have discussed plan of care as described above with RN , patient and family on 11/24/2019  Voice Recognition /Dragon dictation system was used to create this note, attempts have been made to correct errors. Please contact the author with questions and/or clarifications.   Florencia Reasons, MD PhD FACP [facility]  Available via Epic secure chat 7am-7pm for nonurgent issues Please page for urgent issues To page the attending provider between 7A-7P or the covering provider during after hours 7P-7A, please log into the web site [email or web address] and access using universal [place] password for that web site. If you do not have the password, please call the hospital operator.    11/24/2019, 8:47 AM

## 2019-11-25 ENCOUNTER — Telehealth: Payer: Self-pay

## 2019-11-25 DIAGNOSIS — S2242XA Multiple fractures of ribs, left side, initial encounter for closed fracture: Secondary | ICD-10-CM | POA: Diagnosis not present

## 2019-11-25 DIAGNOSIS — E43 Unspecified severe protein-calorie malnutrition: Secondary | ICD-10-CM | POA: Diagnosis not present

## 2019-11-25 LAB — CBC WITH DIFFERENTIAL/PLATELET
Abs Immature Granulocytes: 0.11 10*3/uL — ABNORMAL HIGH (ref 0.00–0.07)
Basophils Absolute: 0 10*3/uL (ref 0.0–0.1)
Basophils Relative: 1 %
Eosinophils Absolute: 0.1 10*3/uL (ref 0.0–0.5)
Eosinophils Relative: 1 %
HCT: 31.3 % — ABNORMAL LOW (ref 36.0–46.0)
Hemoglobin: 9.4 g/dL — ABNORMAL LOW (ref 12.0–15.0)
Immature Granulocytes: 2 %
Lymphocytes Relative: 14 %
Lymphs Abs: 1.1 10*3/uL (ref 0.7–4.0)
MCH: 25.8 pg — ABNORMAL LOW (ref 26.0–34.0)
MCHC: 30 g/dL (ref 30.0–36.0)
MCV: 85.8 fL (ref 80.0–100.0)
Monocytes Absolute: 0.8 10*3/uL (ref 0.1–1.0)
Monocytes Relative: 11 %
Neutro Abs: 5.3 10*3/uL (ref 1.7–7.7)
Neutrophils Relative %: 71 %
Platelets: 306 10*3/uL (ref 150–400)
RBC: 3.65 MIL/uL — ABNORMAL LOW (ref 3.87–5.11)
RDW: 20.3 % — ABNORMAL HIGH (ref 11.5–15.5)
WBC: 7.5 10*3/uL (ref 4.0–10.5)
nRBC: 0 % (ref 0.0–0.2)

## 2019-11-25 LAB — TYPE AND SCREEN
ABO/RH(D): O POS
Antibody Screen: NEGATIVE
Unit division: 0

## 2019-11-25 LAB — BPAM RBC
Blood Product Expiration Date: 202111182359
ISSUE DATE / TIME: 202110171612
Unit Type and Rh: 5100

## 2019-11-25 LAB — BASIC METABOLIC PANEL
Anion gap: 12 (ref 5–15)
BUN: 31 mg/dL — ABNORMAL HIGH (ref 8–23)
CO2: 21 mmol/L — ABNORMAL LOW (ref 22–32)
Calcium: 8.5 mg/dL — ABNORMAL LOW (ref 8.9–10.3)
Chloride: 107 mmol/L (ref 98–111)
Creatinine, Ser: 0.95 mg/dL (ref 0.44–1.00)
GFR, Estimated: 52 mL/min — ABNORMAL LOW (ref >60)
Glucose, Bld: 100 mg/dL — ABNORMAL HIGH (ref 70–99)
Potassium: 4.9 mmol/L (ref 3.5–5.1)
Sodium: 140 mmol/L (ref 135–145)

## 2019-11-25 MED ORDER — POLYETHYLENE GLYCOL 3350 17 G PO PACK: 17.0000 g | PACK | Freq: Two times a day (BID) | ORAL | 0 refills | Status: AC

## 2019-11-25 MED ORDER — DRONABINOL 2.5 MG PO CAPS: 2.5000 mg | ORAL_CAPSULE | Freq: Two times a day (BID) | ORAL | 0 refills | Status: DC

## 2019-11-25 MED ORDER — HYDROCODONE-ACETAMINOPHEN 5-325 MG PO TABS
1.0000 | ORAL_TABLET | ORAL | 0 refills | Status: AC | PRN
Start: 2019-11-25 — End: ?

## 2019-11-25 MED ORDER — LIDOCAINE 5 % EX PTCH: 1.0000 | MEDICATED_PATCH | CUTANEOUS | 0 refills | Status: AC

## 2019-11-25 MED ORDER — LACTULOSE 10 GM/15ML PO SOLN: 10.0000 g | Freq: Two times a day (BID) | ORAL | 0 refills | Status: AC

## 2019-11-25 NOTE — Discharge Summary (Signed)
Physician Discharge Summary  Dawn Aguirre OVF:[id number] DOB: 12-31-1925 DOA: 11/23/2019  PCP: [name], MD  Admit date: 11/23/2019 Discharge date: 11/25/2019  Admitted From: Home Disposition: Home with home hospice  Recommendations for Outpatient Follow-up:  1. As per home hospice plan  Home Health: Hospice Equipment/Devices: DME, hospital bed  Discharge Condition: Fair CODE STATUS: DNR Diet recommendation: Regular as tolerated  Discharge summary: Patient is 86 year old female with history of hypertension, hyperlipidemia, hypothyroidism, stage III chronic kidney disease, progressive anemia, depression anxiety presented to the ER with fall at home, left-sided chest pain.  CT scan of the chest showed nondisplaced 10-11 and 12th rib fracture.  Patient with progressive debility, admitted for pain control and further management.  Fall/failure to thrive/poor oral intake and progressive debility in a patient with multiple medical issues with advanced age and is related debilitation: Patient was admitted to hospital for symptomatic treatment she was treated with pain medications and local pain patch with some relief of symptoms. She remains in the high risk of death due to poor oral intake.  Not desired artificial nutrition or tube feeding. Family desired patient to go home with family support and enrollment into home hospice program that is appropriate plan moving forward.  She has adequate family support at home. On admission, she was given 1 unit of PRBC to manage her symptomatic anemia with appropriate response.  Patient was prescribed pain medications, Marinol to increase her appetite.  She will be admitted by home hospice program today and will be provided with further services.  Discharge Diagnoses:  Principal Problem:   Multiple closed fractures of ribs of left side Active Problems:   Hypothyroidism   Hyperlipidemia   History of breast cancer   Fall in home   Depression  with anxiety   Hypertension, essential   Anemia of chronic disease   CKD (chronic kidney disease) stage 3, GFR 30-59 ml/min (HCC)   Falls   Severe malnutrition ([place])    Discharge Instructions  Discharge Instructions    Diet - low sodium heart healthy   Complete by: As directed    Increase activity slowly   Complete by: As directed      Allergies as of 11/25/2019      Reactions   Ace Inhibitors    REACTION: cough   Dm-apap-cpm Nausea Only   Ezetimibe    REACTION: myalgia   Ezetimibe-simvastatin    REACTION: joints swell   Macrobid [nitrofurantoin]    ABD PAIN, ALSO BODY ACHES/PAIN   Moxifloxacin Other (See Comments)   unknown   Rosuvastatin    REACTION: myalgia   Septra [sulfamethoxazole-trimethoprim]    Sick feeling    Simvastatin    REACTION: myalgia   Triaminic       Medication List    STOP taking these medications   ferrous sulfate 300 (60 Fe) MG/5ML syrup     TAKE these medications   acetaminophen 500 MG tablet Commonly known as: TYLENOL Take 500 mg by mouth every 6 (six) hours as needed for moderate pain.   [employer] VITAMIN B12 2000 MCG tablet Generic drug: cyanocobalamin Take 2,000 mcg by mouth daily.   dronabinol 2.5 MG capsule Commonly known as: MARINOL Take 1 capsule (2.5 mg total) by mouth 2 (two) times daily before lunch and supper.   HYDROcodone-acetaminophen 5-325 MG tablet Commonly known as: NORCO/VICODIN Take 1-2 tablets by mouth every 4 (four) hours as needed for moderate pain (Hold & Call MD if SBP<90, HR<65, RR<10, O2<90, or altered  mental status.).   IRON PO Take 1 tablet by mouth daily.   lactulose 10 GM/15ML solution Commonly known as: CHRONULAC Take 15 mLs (10 g total) by mouth 2 (two) times daily.   levothyroxine 88 MCG tablet Commonly known as: SYNTHROID Take 1 tablet (88 mcg total) by mouth daily before breakfast.   lidocaine 5 % Commonly known as: LIDODERM Place 1 patch onto the skin daily. Remove & Discard patch within  12 hours or as directed by MD   mirtazapine 15 MG tablet Commonly known as: REMERON Take 0.5 tablets (7.5 mg total) by mouth at bedtime. What changed: Another medication with the same name was removed. Continue taking this medication, and follow the directions you see here.   polyethylene glycol 17 g packet Commonly known as: MIRALAX / GLYCOLAX Take 17 g by mouth 2 (two) times daily.   senna 8.6 MG Tabs tablet Commonly known as: SENOKOT Take 1 tablet (8.6 mg total) by mouth at bedtime as needed for mild constipation.       Allergies  Allergen Reactions  . Ace Inhibitors     REACTION: cough  . Dm-Apap-Cpm Nausea Only  . Ezetimibe     REACTION: myalgia  . Ezetimibe-Simvastatin     REACTION: joints swell  . Macrobid [Nitrofurantoin]     ABD PAIN, ALSO BODY ACHES/PAIN  . Moxifloxacin Other (See Comments)    unknown  . Rosuvastatin     REACTION: myalgia  . Septra [Sulfamethoxazole-Trimethoprim]     Sick feeling   . Simvastatin     REACTION: myalgia  . Triaminic     Consultations:  Hospice   Procedures/Studies: DG Tibia/Fibula Left  Result Date: 11/23/2019 CLINICAL DATA:  Fall, pain EXAM: LEFT TIBIA AND FIBULA - 2 VIEW COMPARISON:  None. FINDINGS: No acute bony abnormality. Specifically, no fracture, subluxation, or dislocation. Soft tissues are unremarkable. IMPRESSION: No acute bony abnormality. Electronically Signed   By: Rolm Baptise M.D.   On: 11/23/2019 16:57   CT Head Wo Contrast  Result Date: 11/23/2019 CLINICAL DATA:  Head trauma.  Fall last night. EXAM: CT HEAD WITHOUT CONTRAST CT CERVICAL SPINE WITHOUT CONTRAST TECHNIQUE: Multidetector CT imaging of the head and cervical spine was performed following the standard protocol without intravenous contrast. Multiplanar CT image reconstructions of the cervical spine were also generated. COMPARISON:  None. FINDINGS: CT HEAD FINDINGS Brain: There is no evidence of an acute cortically based infarct, intracranial  hemorrhage, mass, midline shift, or extra-axial fluid collection. Patchy hypodensities in the cerebral white matter bilaterally are nonspecific but compatible with moderate chronic small vessel ischemic disease. A punctate infarct is suspected in the left caudate nucleus, presumably chronic with this history. Mild cerebral atrophy is within normal limits for age. Vascular: Calcified atherosclerosis at the skull base. No hyperdense vessel. Skull: No fracture is identified with the skull vertex incompletely imaged. Sinuses/Orbits: Minimal mucosal thickening in the left sphenoid and ethmoid sinuses. Clear mastoid air cells. Right cataract extraction. Other: None. CT CERVICAL SPINE FINDINGS Alignment: Grade 1 anterolisthesis of C3 on C4, C4 on C5, T1 on T2, T2 on T3, and T3 on T4 and grade 1 retrolisthesis of C5 on C6, all likely degenerative. Skull base and vertebrae: No acute fracture or suspicious osseous lesion. Soft tissues and spinal canal: No prevertebral fluid or swelling. No visible canal hematoma. Disc levels: Advanced disc degeneration at C5-6 greater than C6-7. Severe facet arthrosis bilaterally at C3-4 and on the left at C2-3 and C4-5. Moderate to severe neural foraminal  stenosis on the right at C3-4 and on the left at C4-5, C5-6, and C6-7. Moderate spinal stenosis at C5-6. Upper chest: Reported separately. Other: Mild calcific atherosclerosis at the left greater than right carotid bifurcations. IMPRESSION: 1. No evidence of acute intracranial abnormality. 2. Moderate chronic small vessel ischemic disease. 3. No acute cervical spine fracture. 4. Advanced cervical disc and facet degeneration. Electronically Signed   By: Logan Bores M.D.   On: 11/23/2019 17:03   CT Chest W Contrast  Result Date: 11/23/2019 CLINICAL DATA:  Acute pain due to trauma. Left chest wall and back pain. EXAM: CT CHEST, ABDOMEN AND PELVIS WITHOUT CONTRAST CT LUMBAR SPINE WITHOUT CONTRAST TECHNIQUE: Multidetector CT imaging of the  abdomen and pelvis was performed following the standard protocol without IV contrast. Multiplanar CT images of the lumbar spine were reconstructed from contemporary CT of the Abdomen, and Pelvis COMPARISON:  December 09, 2016 CT of the chest. FINDINGS: CHEST Cardiovascular: The heart is mildly enlarged. There is a trace pericardial effusion. Atherosclerotic changes are noted of the thoracic aorta without evidence for dissection or aneurysm. There are coronary artery calcifications. There is no large centrally located pulmonary embolism. Mediastinum/Nodes: --there is a necrotic appearing enlarged precarinal lymph node measuring approximately 1.4 cm in the short axis (axial series 4, image 21). There is an enlarged lymph node along the course of the esophagus measuring approximately 1.3 cm (axial series 4, image 22). There is a pathologically enlarged lymph node in the upper mediastinum measuring approximately 1.9 cm in the short axis (axial series 4, image 9). -- No hilar lymphadenopathy. -- No axillary lymphadenopathy. --there are pathologically enlarged supraclavicular lymph nodes measuring approximately 1.5 cm (axial series 4, image 4). -- Normal thyroid gland where visualized. -  Unremarkable esophagus. Lungs/Pleura: There is a trace left-sided pleural effusion, otherwise the lungs are mostly clear without evidence for pneumothorax or large focal infiltrate. The trachea is unremarkable. Musculoskeletal: There is a soft tissue nodule measuring approximately 1.2 cm in the superior left breast (axial series 4, image 26). This is new since 2018. There are nondisplaced fractures involving the posterior tenth through twelfth ribs on the left. ABDOMEN AND PELVIS Hepatobiliary: The liver is normal. The gallbladder is not well appreciated and may be surgically absent.There is no biliary ductal dilation. Pancreas: Normal contours without ductal dilatation. No peripancreatic fluid collection. Spleen: Unremarkable.  Adrenals/Urinary Tract: --Adrenal glands: Unremarkable. --Right kidney/ureter: No hydronephrosis or radiopaque kidney stones. --Left kidney/ureter: No hydronephrosis or radiopaque kidney stones. --Urinary bladder: Unremarkable. Stomach/Bowel: --Stomach/Duodenum: The stomach is mildly distended with an air-fluid level. --Small bowel: Unremarkable. --Colon: There are scattered colonic diverticula without CT evidence for diverticulitis. There is a large amount of stool in the colon. --Appendix: Not visualized. No right lower quadrant inflammation or free fluid. Vascular/Lymphatic: Atherosclerotic calcification is present within the non-aneurysmal abdominal aorta, without hemodynamically significant stenosis. --a few enlarged retroperitoneal lymph nodes are noted measuring up to approximately 1.3 cm in the short axis. --multiple pathologically enlarged mesenteric lymph nodes are noted primarily in the left mid abdomen. For example there is a nodal mass measuring approximately 3.8 x 3.7 cm (axial series 4, image 76). --mildly enlarged inguinal lymph nodes are noted. Reproductive: Status post hysterectomy. No adnexal mass. Other: No ascites or free air. The abdominal wall is normal. Musculoskeletal. There is no acute displaced fracture of the lumbar spine. Advanced multilevel degenerative changes are noted of the lumbar spine. IMPRESSION: 1. Nondisplaced fractures involving the posterior tenth through twelfth ribs on the left.  No pneumothorax. 2. Trace left-sided pleural effusion. 3. No acute displaced fracture of the lumbar spine. 4. There is a 1.2 cm soft tissue nodule in the superior left breast. This is new since 2018. While this could represent a benign process, a primary breast malignancy is not excluded. Follow-up with outpatient mammography is recommended. 5. Multiple pathologically enlarged lymph nodes are noted in the chest and abdomen concerning for a lymphoproliferative disorder versus metastatic disease. The  lymph nodes in the left supraclavicular region are readily amenable to ultrasound-guided percutaneous biopsy if clinically indicated. 6. Large amount of stool in the colon. Aortic Atherosclerosis (ICD10-I70.0). Electronically Signed   By: Constance Holster M.D.   On: 11/23/2019 17:09   CT Cervical Spine Wo Contrast  Result Date: 11/23/2019 CLINICAL DATA:  Head trauma.  Fall last night. EXAM: CT HEAD WITHOUT CONTRAST CT CERVICAL SPINE WITHOUT CONTRAST TECHNIQUE: Multidetector CT imaging of the head and cervical spine was performed following the standard protocol without intravenous contrast. Multiplanar CT image reconstructions of the cervical spine were also generated. COMPARISON:  None. FINDINGS: CT HEAD FINDINGS Brain: There is no evidence of an acute cortically based infarct, intracranial hemorrhage, mass, midline shift, or extra-axial fluid collection. Patchy hypodensities in the cerebral white matter bilaterally are nonspecific but compatible with moderate chronic small vessel ischemic disease. A punctate infarct is suspected in the left caudate nucleus, presumably chronic with this history. Mild cerebral atrophy is within normal limits for age. Vascular: Calcified atherosclerosis at the skull base. No hyperdense vessel. Skull: No fracture is identified with the skull vertex incompletely imaged. Sinuses/Orbits: Minimal mucosal thickening in the left sphenoid and ethmoid sinuses. Clear mastoid air cells. Right cataract extraction. Other: None. CT CERVICAL SPINE FINDINGS Alignment: Grade 1 anterolisthesis of C3 on C4, C4 on C5, T1 on T2, T2 on T3, and T3 on T4 and grade 1 retrolisthesis of C5 on C6, all likely degenerative. Skull base and vertebrae: No acute fracture or suspicious osseous lesion. Soft tissues and spinal canal: No prevertebral fluid or swelling. No visible canal hematoma. Disc levels: Advanced disc degeneration at C5-6 greater than C6-7. Severe facet arthrosis bilaterally at C3-4 and on the  left at C2-3 and C4-5. Moderate to severe neural foraminal stenosis on the right at C3-4 and on the left at C4-5, C5-6, and C6-7. Moderate spinal stenosis at C5-6. Upper chest: Reported separately. Other: Mild calcific atherosclerosis at the left greater than right carotid bifurcations. IMPRESSION: 1. No evidence of acute intracranial abnormality. 2. Moderate chronic small vessel ischemic disease. 3. No acute cervical spine fracture. 4. Advanced cervical disc and facet degeneration. Electronically Signed   By: Logan Bores M.D.   On: 11/23/2019 17:03   CT ABDOMEN PELVIS W CONTRAST  Result Date: 11/23/2019 CLINICAL DATA:  Acute pain due to trauma. Left chest wall and back pain. EXAM: CT CHEST, ABDOMEN AND PELVIS WITHOUT CONTRAST CT LUMBAR SPINE WITHOUT CONTRAST TECHNIQUE: Multidetector CT imaging of the abdomen and pelvis was performed following the standard protocol without IV contrast. Multiplanar CT images of the lumbar spine were reconstructed from contemporary CT of the Abdomen, and Pelvis COMPARISON:  December 09, 2016 CT of the chest. FINDINGS: CHEST Cardiovascular: The heart is mildly enlarged. There is a trace pericardial effusion. Atherosclerotic changes are noted of the thoracic aorta without evidence for dissection or aneurysm. There are coronary artery calcifications. There is no large centrally located pulmonary embolism. Mediastinum/Nodes: --there is a necrotic appearing enlarged precarinal lymph node measuring approximately 1.4 cm in the short  axis (axial series 4, image 21). There is an enlarged lymph node along the course of the esophagus measuring approximately 1.3 cm (axial series 4, image 22). There is a pathologically enlarged lymph node in the upper mediastinum measuring approximately 1.9 cm in the short axis (axial series 4, image 9). -- No hilar lymphadenopathy. -- No axillary lymphadenopathy. --there are pathologically enlarged supraclavicular lymph nodes measuring approximately 1.5 cm  (axial series 4, image 4). -- Normal thyroid gland where visualized. -  Unremarkable esophagus. Lungs/Pleura: There is a trace left-sided pleural effusion, otherwise the lungs are mostly clear without evidence for pneumothorax or large focal infiltrate. The trachea is unremarkable. Musculoskeletal: There is a soft tissue nodule measuring approximately 1.2 cm in the superior left breast (axial series 4, image 26). This is new since 2018. There are nondisplaced fractures involving the posterior tenth through twelfth ribs on the left. ABDOMEN AND PELVIS Hepatobiliary: The liver is normal. The gallbladder is not well appreciated and may be surgically absent.There is no biliary ductal dilation. Pancreas: Normal contours without ductal dilatation. No peripancreatic fluid collection. Spleen: Unremarkable. Adrenals/Urinary Tract: --Adrenal glands: Unremarkable. --Right kidney/ureter: No hydronephrosis or radiopaque kidney stones. --Left kidney/ureter: No hydronephrosis or radiopaque kidney stones. --Urinary bladder: Unremarkable. Stomach/Bowel: --Stomach/Duodenum: The stomach is mildly distended with an air-fluid level. --Small bowel: Unremarkable. --Colon: There are scattered colonic diverticula without CT evidence for diverticulitis. There is a large amount of stool in the colon. --Appendix: Not visualized. No right lower quadrant inflammation or free fluid. Vascular/Lymphatic: Atherosclerotic calcification is present within the non-aneurysmal abdominal aorta, without hemodynamically significant stenosis. --a few enlarged retroperitoneal lymph nodes are noted measuring up to approximately 1.3 cm in the short axis. --multiple pathologically enlarged mesenteric lymph nodes are noted primarily in the left mid abdomen. For example there is a nodal mass measuring approximately 3.8 x 3.7 cm (axial series 4, image 76). --mildly enlarged inguinal lymph nodes are noted. Reproductive: Status post hysterectomy. No adnexal mass.  Other: No ascites or free air. The abdominal wall is normal. Musculoskeletal. There is no acute displaced fracture of the lumbar spine. Advanced multilevel degenerative changes are noted of the lumbar spine. IMPRESSION: 1. Nondisplaced fractures involving the posterior tenth through twelfth ribs on the left. No pneumothorax. 2. Trace left-sided pleural effusion. 3. No acute displaced fracture of the lumbar spine. 4. There is a 1.2 cm soft tissue nodule in the superior left breast. This is new since 2018. While this could represent a benign process, a primary breast malignancy is not excluded. Follow-up with outpatient mammography is recommended. 5. Multiple pathologically enlarged lymph nodes are noted in the chest and abdomen concerning for a lymphoproliferative disorder versus metastatic disease. The lymph nodes in the left supraclavicular region are readily amenable to ultrasound-guided percutaneous biopsy if clinically indicated. 6. Large amount of stool in the colon. Aortic Atherosclerosis (ICD10-I70.0). Electronically Signed   By: Constance Holster M.D.   On: 11/23/2019 17:09   CT L-SPINE NO CHARGE  Result Date: 11/23/2019 CLINICAL DATA:  Acute pain due to trauma. Left chest wall and back pain. EXAM: CT CHEST, ABDOMEN AND PELVIS WITHOUT CONTRAST CT LUMBAR SPINE WITHOUT CONTRAST TECHNIQUE: Multidetector CT imaging of the abdomen and pelvis was performed following the standard protocol without IV contrast. Multiplanar CT images of the lumbar spine were reconstructed from contemporary CT of the Abdomen, and Pelvis COMPARISON:  December 09, 2016 CT of the chest. FINDINGS: CHEST Cardiovascular: The heart is mildly enlarged. There is a trace pericardial effusion. Atherosclerotic changes are  noted of the thoracic aorta without evidence for dissection or aneurysm. There are coronary artery calcifications. There is no large centrally located pulmonary embolism. Mediastinum/Nodes: --there is a necrotic appearing  enlarged precarinal lymph node measuring approximately 1.4 cm in the short axis (axial series 4, image 21). There is an enlarged lymph node along the course of the esophagus measuring approximately 1.3 cm (axial series 4, image 22). There is a pathologically enlarged lymph node in the upper mediastinum measuring approximately 1.9 cm in the short axis (axial series 4, image 9). -- No hilar lymphadenopathy. -- No axillary lymphadenopathy. --there are pathologically enlarged supraclavicular lymph nodes measuring approximately 1.5 cm (axial series 4, image 4). -- Normal thyroid gland where visualized. -  Unremarkable esophagus. Lungs/Pleura: There is a trace left-sided pleural effusion, otherwise the lungs are mostly clear without evidence for pneumothorax or large focal infiltrate. The trachea is unremarkable. Musculoskeletal: There is a soft tissue nodule measuring approximately 1.2 cm in the superior left breast (axial series 4, image 26). This is new since 2018. There are nondisplaced fractures involving the posterior tenth through twelfth ribs on the left. ABDOMEN AND PELVIS Hepatobiliary: The liver is normal. The gallbladder is not well appreciated and may be surgically absent.There is no biliary ductal dilation. Pancreas: Normal contours without ductal dilatation. No peripancreatic fluid collection. Spleen: Unremarkable. Adrenals/Urinary Tract: --Adrenal glands: Unremarkable. --Right kidney/ureter: No hydronephrosis or radiopaque kidney stones. --Left kidney/ureter: No hydronephrosis or radiopaque kidney stones. --Urinary bladder: Unremarkable. Stomach/Bowel: --Stomach/Duodenum: The stomach is mildly distended with an air-fluid level. --Small bowel: Unremarkable. --Colon: There are scattered colonic diverticula without CT evidence for diverticulitis. There is a large amount of stool in the colon. --Appendix: Not visualized. No right lower quadrant inflammation or free fluid. Vascular/Lymphatic: Atherosclerotic  calcification is present within the non-aneurysmal abdominal aorta, without hemodynamically significant stenosis. --a few enlarged retroperitoneal lymph nodes are noted measuring up to approximately 1.3 cm in the short axis. --multiple pathologically enlarged mesenteric lymph nodes are noted primarily in the left mid abdomen. For example there is a nodal mass measuring approximately 3.8 x 3.7 cm (axial series 4, image 76). --mildly enlarged inguinal lymph nodes are noted. Reproductive: Status post hysterectomy. No adnexal mass. Other: No ascites or free air. The abdominal wall is normal. Musculoskeletal. There is no acute displaced fracture of the lumbar spine. Advanced multilevel degenerative changes are noted of the lumbar spine. IMPRESSION: 1. Nondisplaced fractures involving the posterior tenth through twelfth ribs on the left. No pneumothorax. 2. Trace left-sided pleural effusion. 3. No acute displaced fracture of the lumbar spine. 4. There is a 1.2 cm soft tissue nodule in the superior left breast. This is new since 2018. While this could represent a benign process, a primary breast malignancy is not excluded. Follow-up with outpatient mammography is recommended. 5. Multiple pathologically enlarged lymph nodes are noted in the chest and abdomen concerning for a lymphoproliferative disorder versus metastatic disease. The lymph nodes in the left supraclavicular region are readily amenable to ultrasound-guided percutaneous biopsy if clinically indicated. 6. Large amount of stool in the colon. Aortic Atherosclerosis (ICD10-I70.0). Electronically Signed   By: Constance Holster M.D.   On: 11/23/2019 17:09   DG Chest Portable 1 View  Result Date: 11/06/2019 CLINICAL DATA:  Weakness, anemia EXAM: PORTABLE CHEST 1 VIEW COMPARISON:  Prior chest x-ray 02/19/2019 FINDINGS: Stable cardiomegaly. No significant vascular congestion or evidence of pulmonary edema. Mediastinal contours are stable. Atherosclerotic  calcifications present in the transverse aorta. Stable mild bronchitic changes. No focal  airspace consolidation, pleural effusion or pneumothorax. Surgical changes of prior left breast biopsy again noted. IMPRESSION: 1. No acute cardiopulmonary process. 2. Stable chronic bronchitic changes and mild cardiomegaly. 3. Aortic atherosclerosis. Electronically Signed   By: Jacqulynn Cadet M.D.   On: 11/06/2019 13:52   (Echo, Carotid, EGD, Colonoscopy, ERCP)    Subjective: Patient seen and examined.  Pleasantly confused.  Inconsistent on complaining of pain.  Nursing reported patient not eating any meal.   Discharge Exam: Vitals:   11/25/19 0344 11/25/19 0754  BP: 129/67 108/62  Pulse: 75 74  Resp: 17 16  Temp: (!) 97.3 F (36.3 C)   SpO2: 98% 99%   Vitals:   11/24/19 2359 11/25/19 0344 11/25/19 0754 11/25/19 0854  BP: (!) 100/47 129/67 108/62   Pulse: 70 75 74   Resp: 16 17 16    Temp: 98.6 F (37 C) (!) 97.3 F (36.3 C)    TempSrc:      SpO2: 100% 98% 99%   Weight:    53 kg  Height:   5\' 3"  (1.6 m)     General: Pt is alert, awake, mildly anxious, pleasantly confused. Cardiovascular: RRR, S1/S2 +, no rubs, no gallops Respiratory: CTA bilaterally, no wheezing, no rhonchi, no added sounds.  Diffuse tenderness on chest palpation with no visible deformity. Abdominal: Soft, NT, ND, bowel sounds + Extremities: no edema, no cyanosis    The results of significant diagnostics from this hospitalization (including imaging, microbiology, ancillary and laboratory) are listed below for reference.     Microbiology: Recent Results (from the past 240 hour(s))  Respiratory Panel by RT PCR (Flu A&B, Covid) - Nasopharyngeal Swab     Status: None   Collection Time: 11/23/19  6:08 PM   Specimen: Nasopharyngeal Swab  Result Value Ref Range Status   SARS Coronavirus 2 by RT PCR NEGATIVE NEGATIVE Final    Comment: (NOTE) SARS-CoV-2 target nucleic acids are NOT DETECTED.  The SARS-CoV-2 RNA is  generally detectable in upper respiratoy specimens during the acute phase of infection. The lowest concentration of SARS-CoV-2 viral copies this assay can detect is 131 copies/mL. A negative result does not preclude SARS-Cov-2 infection and should not be used as the sole basis for treatment or other patient management decisions. A negative result may occur with  improper specimen collection/handling, submission of specimen other than nasopharyngeal swab, presence of viral mutation(s) within the areas targeted by this assay, and inadequate number of viral copies (<131 copies/mL). A negative result must be combined with clinical observations, patient history, and epidemiological information. The expected result is Negative.  Fact Sheet for Patients:  PinkCheek.be  Fact Sheet for Healthcare Providers:  GravelBags.it  This test is no t yet approved or cleared by the [place] FDA and  has been authorized for detection and/or diagnosis of SARS-CoV-2 by FDA under an Emergency Use Authorization (EUA). This EUA will remain  in effect (meaning this test can be used) for the duration of the COVID-19 declaration under Section 564(b)(1) of the Act, 21 U.S.C. section 360bbb-3(b)(1), unless the authorization is terminated or revoked sooner.     Influenza A by PCR NEGATIVE NEGATIVE Final   Influenza B by PCR NEGATIVE NEGATIVE Final    Comment: (NOTE) The Xpert Xpress SARS-CoV-2/FLU/RSV assay is intended as an aid in  the diagnosis of influenza from Nasopharyngeal swab specimens and  should not be used as a sole basis for treatment. Nasal washings and  aspirates are unacceptable for Xpert Xpress SARS-CoV-2/FLU/RSV  testing.  Fact Sheet for Patients: PinkCheek.be  Fact Sheet for Healthcare Providers: GravelBags.it  This test is not yet approved or cleared by the [place] FDA and  has been authorized for detection and/or diagnosis of SARS-CoV-2 by  FDA under an Emergency Use Authorization (EUA). This EUA will remain  in effect (meaning this test can be used) for the duration of the  Covid-19 declaration under Section 564(b)(1) of the Act, 21  U.S.C. section 360bbb-3(b)(1), unless the authorization is  terminated or revoked. Performed at [facility], [employer] [address], [place], [place] [address]      Labs: BNP (last 3 results) No results for input(s): BNP in the last 8760 hours. Basic Metabolic Panel: Recent Labs  Lab 11/23/19 1536 11/23/19 1556 11/24/19 0500 11/24/19 0911 11/25/19 0342  NA 134* 136 139 138 140  K 4.6 4.6 5.7* 4.9 4.9  CL 103 104 106 103 107  CO2 19*  --  21* 22 21*  GLUCOSE 167* 163* 116* 102* 100*  BUN 48* 42* 39* 38* 31*  CREATININE 1.24* 1.20* 1.19* 1.12* 0.95  CALCIUM 8.5*  --  8.8* 8.8* 8.5*   Liver Function Tests: No results for input(s): AST, ALT, ALKPHOS, BILITOT, PROT, ALBUMIN in the last 168 hours. No results for input(s): LIPASE, AMYLASE in the last 168 hours. No results for input(s): AMMONIA in the last 168 hours. CBC: Recent Labs  Lab 11/23/19 1536 11/23/19 1556 11/24/19 0500 11/25/19 0342  WBC 9.0  --  10.7* 7.5  NEUTROABS 6.9  --   --  5.3  HGB 8.3* 8.5* 7.3* 9.4*  HCT 28.3* 25.0* 25.6* 31.3*  MCV 84.0  --  85.9 85.8  PLT 390  --  340 306   Cardiac Enzymes: No results for input(s): CKTOTAL, CKMB, CKMBINDEX, TROPONINI in the last 168 hours. BNP: Invalid input(s): POCBNP CBG: No results for input(s): GLUCAP in the last 168 hours. D-Dimer No results for input(s): DDIMER in the last 72 hours. Hgb A1c No results for input(s): HGBA1C in the last 72 hours. Lipid Profile No results for input(s): CHOL, HDL, LDLCALC, TRIG, CHOLHDL, LDLDIRECT in the last 72 hours. Thyroid function studies No results for input(s): TSH, T4TOTAL, T3FREE, THYROIDAB in the last 72 hours.  Invalid  input(s): FREET3 Anemia work up No results for input(s): VITAMINB12, FOLATE, FERRITIN, TIBC, IRON, RETICCTPCT in the last 72 hours. Urinalysis    Component Value Date/Time   COLORURINE YELLOW 11/23/2019 1808   APPEARANCEUR CLEAR 11/23/2019 1808   LABSPEC 1.039 (H) 11/23/2019 1808   PHURINE 5.0 11/23/2019 1808   GLUCOSEU NEGATIVE 11/23/2019 1808   HGBUR NEGATIVE 11/23/2019 1808   BILIRUBINUR NEGATIVE 11/23/2019 1808   BILIRUBINUR neg 05/30/2018 1454   KETONESUR NEGATIVE 11/23/2019 1808   PROTEINUR NEGATIVE 11/23/2019 1808   UROBILINOGEN 0.2 05/30/2018 1454   UROBILINOGEN 0.2 02/17/2013 1243   NITRITE NEGATIVE 11/23/2019 1808   LEUKOCYTESUR NEGATIVE 11/23/2019 1808   Sepsis Labs Invalid input(s): PROCALCITONIN,  WBC,  LACTICIDVEN Microbiology Recent Results (from the past 240 hour(s))  Respiratory Panel by RT PCR (Flu A&B, Covid) - Nasopharyngeal Swab     Status: None   Collection Time: 11/23/19  6:08 PM   Specimen: Nasopharyngeal Swab  Result Value Ref Range Status   SARS Coronavirus 2 by RT PCR NEGATIVE NEGATIVE Final    Comment: (NOTE) SARS-CoV-2 target nucleic acids are NOT DETECTED.  The SARS-CoV-2 RNA is generally detectable in upper respiratoy specimens during the acute phase of infection. The lowest concentration of  SARS-CoV-2 viral copies this assay can detect is 131 copies/mL. A negative result does not preclude SARS-Cov-2 infection and should not be used as the sole basis for treatment or other patient management decisions. A negative result may occur with  improper specimen collection/handling, submission of specimen other than nasopharyngeal swab, presence of viral mutation(s) within the areas targeted by this assay, and inadequate number of viral copies (<131 copies/mL). A negative result must be combined with clinical observations, patient history, and epidemiological information. The expected result is Negative.  Fact Sheet for Patients:   PinkCheek.be  Fact Sheet for Healthcare Providers:  GravelBags.it  This test is no t yet approved or cleared by the [place] FDA and  has been authorized for detection and/or diagnosis of SARS-CoV-2 by FDA under an Emergency Use Authorization (EUA). This EUA will remain  in effect (meaning this test can be used) for the duration of the COVID-19 declaration under Section 564(b)(1) of the Act, 21 U.S.C. section 360bbb-3(b)(1), unless the authorization is terminated or revoked sooner.     Influenza A by PCR NEGATIVE NEGATIVE Final   Influenza B by PCR NEGATIVE NEGATIVE Final    Comment: (NOTE) The Xpert Xpress SARS-CoV-2/FLU/RSV assay is intended as an aid in  the diagnosis of influenza from Nasopharyngeal swab specimens and  should not be used as a sole basis for treatment. Nasal washings and  aspirates are unacceptable for Xpert Xpress SARS-CoV-2/FLU/RSV  testing.  Fact Sheet for Patients: PinkCheek.be  Fact Sheet for Healthcare Providers: GravelBags.it  This test is not yet approved or cleared by the [place] FDA and  has been authorized for detection and/or diagnosis of SARS-CoV-2 by  FDA under an Emergency Use Authorization (EUA). This EUA will remain  in effect (meaning this test can be used) for the duration of the  Covid-19 declaration under Section 564(b)(1) of the Act, 21  U.S.C. section 360bbb-3(b)(1), unless the authorization is  terminated or revoked. Performed at [facility], [place] [address] [place], [place] [address]      Time coordinating discharge: 35 minutes  SIGNED:   Barb Merino, MD  [facility] 11/25/2019, 1:50 PM

## 2019-11-25 NOTE — Progress Notes (Signed)
Discussed care and d/c plans w/ patient son. Patient has been able to stand and transfer to [facility] several times today w/ SBA/contact guard. Passing gas, passing very small hard round stools. Educated patient and family to continue prescribed laxative/stool softeners, drink water, ambulate w/ walker and family at home. Educated s/s obstruction, obstipation and instructed to return to ED. Verbalized understanding.

## 2019-11-25 NOTE — Telephone Encounter (Signed)
I will be attending. Thanks for letting me know

## 2019-11-25 NOTE — Telephone Encounter (Signed)
Katina [facility] left v/m that pt will be discharged from [facility] soon and pt has been referred for hospice care; Alwyn Ren said there is an urgent need for attending status. Katina request cb.

## 2019-11-25 NOTE — Telephone Encounter (Signed)
Dawn Aguirre with [facility] notified as instructed and Dawn Aguirre voiced understanding and very appreciative.

## 2019-11-25 NOTE — TOC Progression Note (Addendum)
Transition of Care [facility]) - Progression Note    Patient Details  Name: Dawn Aguirre MRN: [id number] Date of Birth: 02-08-1926  Transition of Care [facility]) CM/SW [place], [place] Phone Number: 11/25/2019, 10:32 AM  Clinical Narrative:  [facility] coordinating discharge needs. DME to be delivered today. Family will transport the pt. Home.  DNR sent w/ son  Expected Discharge Plan: Home w Hospice Care Barriers to Discharge: Continued Medical Work up  Expected Discharge Plan and Services Expected Discharge Plan: Onset Determinants of Health (SDOH) Interventions    Readmission Risk Interventions Readmission Risk Prevention Plan 11/08/2019  Post Dischage Appt Complete  Medication Screening Complete  Transportation Screening Complete  Some recent data might be hidden

## 2019-11-25 NOTE — Plan of Care (Signed)
  Problem: Nutrition: Goal: Adequate nutrition will be maintained Outcome: Progressing   Problem: Coping: Goal: Level of anxiety will decrease Outcome: Progressing   Problem: Pain Managment: Goal: General experience of comfort will improve Outcome: Progressing   

## 2019-11-25 NOTE — Progress Notes (Signed)
Patient discharged to home w/ son. Given all belongings, instructions. Verbalized understanding of all instructions. Able to stand/pivot to w/c sba and into pov sba as well. Medication prior to d/c per patient and family request.

## 2019-11-25 NOTE — Progress Notes (Signed)
OT Cancellation Note and Discharge  Patient Details Name: Dawn Aguirre MRN: [id number] DOB: Jul 04, 1925   Cancelled Treatment:    Reason Eval/Treat Not Completed: Other (comment). Pt is now going home with Hospice, eval not completed, we will sign off.  [place], OTR/L Acute Rehab Services Pager [phone] Office [phone]     Almon Register 11/25/2019, 1:10 PM

## 2019-11-25 NOTE — Progress Notes (Signed)
NUTRITION NOTE  Consult received for assessment of nutrition requirements and status and MST score of 2.   Patient is on a Regular diet and ate 0% of all meals yesterday. Boost Plus ordered TID and she has accepted 1 of 3 bottles offered to her, each supplement provides 360 kcal and 14 grams of protein.  Weight today is 117 lb which is up from recordings on 10/7 and 9/29. Weight on 04/25/19 was 136 lb. This indicates 19 lb weight loss (14% body weight) in the past 7 months.   She is noted to be a/o to self and situation today. She is DNR. Discharge order for discharge home with home hospice entered today at ~1130; no discharge summary yet.   If patient is unable to d/c today, will complete full assessment tomorrow (10/19).      Jarome Matin, MS, RD, LDN, CNSC Inpatient Clinical Dietitian RD pager # available in [place]  After hours/weekend pager # available in [facility]

## 2019-11-25 NOTE — Progress Notes (Signed)
Manufacturing engineer [facility])  Hospital liaison spoke with staff at [facility] who show DME pending delivery for this morning.     Pease send signed and completed DNR home with pt/family.  Please provide prescriptions at discharge as needed to ensure ongoing symptom management until pt can be admitted on hospice services.    Above information shared with Evorn Gong Manager.  Please call with any questions or concerns.  Thank you for the opportunity to participate in this pt's care.  Domenic Moras, BSN, RN [employer] [phone] (24h on call)

## 2019-11-27 ENCOUNTER — Telehealth: Payer: Self-pay

## 2019-11-27 MED ORDER — DRONABINOL 2.5 MG PO CAPS: 2.5000 mg | ORAL_CAPSULE | Freq: Two times a day (BID) | ORAL | 3 refills | Status: AC

## 2019-11-27 NOTE — Telephone Encounter (Signed)
Patient was discharged with a scrip for dronabinol. Can not fill because was given by someone at hospital that will not be following up with patient. Family would like to try patient on this. Would like for our office to call in script.

## 2019-11-27 NOTE — Telephone Encounter (Signed)
[facility] to [facility]

## 2019-11-28 ENCOUNTER — Telehealth: Payer: Self-pay | Admitting: Family Medicine

## 2019-11-28 NOTE — Telephone Encounter (Signed)
Pt daugther called due to they bought [facility] home this week and hospice stated that they needed to talk to Dr. Glori Bickers about her care and diagnosis.   Best call back is [phone] for [employer]

## 2019-11-29 NOTE — Telephone Encounter (Signed)
Called back, reviewed d/c summary and CT abd/pelvis  Made plan re: hospice  Disc goals for her depending on how she progresses at home and family will discuss

## 2019-12-04 ENCOUNTER — Telehealth: Payer: Self-pay | Admitting: Hematology and Oncology

## 2019-12-04 NOTE — Telephone Encounter (Signed)
I received a call from the pt's dtr to cxl he hem appt due to pt being in hospice.

## 2019-12-10 ENCOUNTER — Encounter: Payer: Medicare HMO | Admitting: Hematology and Oncology

## 2020-01-08 DEATH — deceased
